# Patient Record
Sex: Female | Born: 1955 | Race: White | Hispanic: No | Smoking: Former smoker
Health system: Southern US, Community
[De-identification: ages and names within clinical notes are randomized; demographics above are authoritative.]

## PROBLEM LIST (undated history)

## (undated) DIAGNOSIS — E785 Hyperlipidemia, unspecified: Secondary | ICD-10-CM

## (undated) DIAGNOSIS — J449 Chronic obstructive pulmonary disease, unspecified: Secondary | ICD-10-CM

## (undated) DIAGNOSIS — F419 Anxiety disorder, unspecified: Secondary | ICD-10-CM

## (undated) DIAGNOSIS — D649 Anemia, unspecified: Secondary | ICD-10-CM

## (undated) HISTORY — PX: CHOLECYSTECTOMY: SHX55

## (undated) HISTORY — DX: Anemia, unspecified: D64.9

## (undated) HISTORY — PX: TUBAL LIGATION: SHX77

## (undated) HISTORY — DX: Hyperlipidemia, unspecified: E78.5

## (undated) HISTORY — PX: APPENDECTOMY: SHX54

## (undated) HISTORY — DX: Anxiety disorder, unspecified: F41.9

---

## 2015-04-05 ENCOUNTER — Ambulatory Visit: Payer: Self-pay

## 2015-04-09 ENCOUNTER — Encounter: Payer: Self-pay | Admitting: *Deleted

## 2015-04-09 ENCOUNTER — Ambulatory Visit: Payer: Worker's Compensation | Admitting: Certified Registered"

## 2015-04-09 ENCOUNTER — Ambulatory Visit
Admission: RE | Admit: 2015-04-09 | Discharge: 2015-04-09 | Disposition: A | Discharge status: Home / Self Care | Payer: Worker's Compensation | Source: Ambulatory Visit | Attending: Orthopedic Surgery | Admitting: Orthopedic Surgery

## 2015-04-09 ENCOUNTER — Encounter
Admission: RE | Disposition: A | Discharge status: Home / Self Care | Payer: Self-pay | Source: Ambulatory Visit | Attending: Orthopedic Surgery

## 2015-04-09 DIAGNOSIS — Z8489 Family history of other specified conditions: Secondary | ICD-10-CM | POA: Insufficient documentation

## 2015-04-09 DIAGNOSIS — M7502 Adhesive capsulitis of left shoulder: Secondary | ICD-10-CM | POA: Insufficient documentation

## 2015-04-09 DIAGNOSIS — Z9049 Acquired absence of other specified parts of digestive tract: Secondary | ICD-10-CM | POA: Diagnosis not present

## 2015-04-09 HISTORY — PX: SHOULDER CLOSED REDUCTION: SHX1051

## 2015-04-09 SURGERY — MANIPULATION, JOINT, SHOULDER, WITH ANESTHESIA
Anesthesia: General | Laterality: Left

## 2015-04-09 MED ORDER — OXYCODONE-ACETAMINOPHEN 5-325 MG PO TABS: 1.0000 | ORAL_TABLET | Freq: Four times a day (QID) | ORAL | Status: DC | PRN

## 2015-04-09 MED ORDER — LACTATED RINGERS IV SOLN
INTRAVENOUS | Status: DC
  Administered 2015-04-09: 13:00:00 via INTRAVENOUS

## 2015-04-09 MED ORDER — TRIAMCINOLONE ACETONIDE 40 MG/ML IJ SUSP
INTRAMUSCULAR | Status: DC | PRN
  Administered 2015-04-09: 11 mL via INTRAMUSCULAR

## 2015-04-09 MED ORDER — LIDOCAINE HCL (CARDIAC) 20 MG/ML IV SOLN
INTRAVENOUS | Status: DC | PRN
  Administered 2015-04-09: 60 mg via INTRAVENOUS

## 2015-04-09 MED ORDER — TRIAMCINOLONE ACETONIDE 40 MG/ML IJ SUSP
INTRAMUSCULAR | Status: AC
  Filled 2015-04-09: qty 1

## 2015-04-09 MED ORDER — FAMOTIDINE 20 MG PO TABS
ORAL_TABLET | ORAL | Status: AC
  Filled 2015-04-09: qty 1

## 2015-04-09 MED ORDER — PROPOFOL 10 MG/ML IV BOLUS
INTRAVENOUS | Status: DC | PRN
  Administered 2015-04-09: 140 mg via INTRAVENOUS

## 2015-04-09 MED ORDER — PHENYLEPHRINE HCL 10 MG/ML IJ SOLN
INTRAMUSCULAR | Status: DC | PRN
  Administered 2015-04-09: 100 ug via INTRAVENOUS

## 2015-04-09 MED ORDER — ONDANSETRON HCL 4 MG/2ML IJ SOLN: 4.0000 mg | Freq: Once | INTRAMUSCULAR | Status: DC | PRN

## 2015-04-09 MED ORDER — BUPIVACAINE-EPINEPHRINE (PF) 0.5% -1:200000 IJ SOLN
INTRAMUSCULAR | Status: AC
  Filled 2015-04-09: qty 30

## 2015-04-09 MED ORDER — FAMOTIDINE 20 MG PO TABS
20.0000 mg | ORAL_TABLET | Freq: Once | ORAL | Status: AC
  Administered 2015-04-09: 20 mg via ORAL

## 2015-04-09 MED ORDER — OXYCODONE-ACETAMINOPHEN 5-325 MG PO TABS
ORAL_TABLET | ORAL | Status: AC
  Filled 2015-04-09: qty 1

## 2015-04-09 MED ORDER — BUPIVACAINE HCL (PF) 0.5 % IJ SOLN: INTRAMUSCULAR | Status: DC | PRN

## 2015-04-09 MED ORDER — OXYCODONE-ACETAMINOPHEN 5-325 MG PO TABS
1.0000 | ORAL_TABLET | Freq: Four times a day (QID) | ORAL | Status: DC | PRN
  Administered 2015-04-09: 1 via ORAL

## 2015-04-09 MED ORDER — FENTANYL CITRATE (PF) 100 MCG/2ML IJ SOLN
INTRAMUSCULAR | Status: DC | PRN
  Administered 2015-04-09: 25 ug via INTRAVENOUS

## 2015-04-09 MED ORDER — FENTANYL CITRATE (PF) 100 MCG/2ML IJ SOLN: 25.0000 ug | INTRAMUSCULAR | Status: DC | PRN

## 2015-04-09 MED ORDER — BUPIVACAINE HCL (PF) 0.5 % IJ SOLN
INTRAMUSCULAR | Status: AC
  Filled 2015-04-09: qty 30

## 2015-04-09 SURGICAL SUPPLY — 6 items
GAUZE SPONGE 4X4 12PLY STRL (GAUZE/BANDAGES/DRESSINGS) ×2 IMPLANT
GLOVE BIOGEL PI IND STRL 9 (GLOVE) IMPLANT
GLOVE BIOGEL PI INDICATOR 9 (GLOVE)
GLOVE SURG ORTHO 9.0 STRL STRW (GLOVE) IMPLANT
GOWN SPECIALTY ULTRA XL (MISCELLANEOUS) IMPLANT
STRAP SAFETY BODY (MISCELLANEOUS) ×2 IMPLANT

## 2015-04-09 NOTE — Anesthesia Postprocedure Evaluation (Signed)
  Anesthesia Post-op Note  Patient: Brandi [name]  Procedure(s) Performed: Procedure(s): CLOSED MANIPULATION SHOULDER (Left)  Anesthesia type:General  Patient location: PACU  Post pain: Pain level controlled  Post assessment: Post-op Vital signs reviewed, Patient's Cardiovascular Status Stable, Respiratory Function Stable, Patent Airway and No signs of Nausea or vomiting  Post vital signs: Reviewed and stable  Last Vitals:  Filed Vitals:   04/09/15 1438  BP:   Pulse: 74  Temp: 36.3 C  Resp:     Level of consciousness: awake, alert  and patient cooperative  Complications: No apparent anesthesia complications

## 2015-04-09 NOTE — Transfer of Care (Signed)
Immediate Anesthesia Transfer of Care Note  Patient: Brandi [name]  Procedure(s) Performed: Procedure(s): CLOSED MANIPULATION SHOULDER (Left)  Patient Location: PACU  Anesthesia Type:General  Level of Consciousness: awake, alert  and oriented  Airway & Oxygen Therapy: Patient connected to face mask oxygen  Post-op Assessment: Report given to RN  Post vital signs: stable  Last Vitals:  Filed Vitals:   04/09/15 1226  BP: 117/78  Pulse: 84  Temp: 36.6 C  Resp: 12    Complications: No apparent anesthesia complications

## 2015-04-09 NOTE — Op Note (Signed)
04/09/2015  2:37 PM  PATIENT:  Brandi [name]  59 y.o. female  PRE-OPERATIVE DIAGNOSIS:  frozen left shoulder  POST-OPERATIVE DIAGNOSIS:  same as pre-op  PROCEDURE:  Procedure(s): CLOSED MANIPULATION SHOULDER (Left)  SURGEON:  Surgeon(s) and Role:    * Kennedy BuckerMichael Milany Geck, MD - Primary  PHYSICIAN ASSISTANT:   ASSISTANTS: none   ANESTHESIA:   general  EBL:     BLOOD ADMINISTERED:none  DRAINS: none   LOCAL MEDICATIONS USED:  MARCAINE    and Amount: 10 ml, Kenalog 40 mg  SPECIMEN:  No Specimen  DISPOSITION OF SPECIMEN:  N/A  COUNTS:  NO count, no iincision  TOURNIQUET:  * No tourniquets in log *  DICTATION: Appropriate patient identification and timeout procedure was completed. The assistant held the scapula stabilized with gentle motion of the forearm was brought up into flexion and was stiff at approximately 110 gentle pressure held with the arm held close to the shoulder to prevent fracture gave audible popping of adhesions and flexion up to 170 bringing the arm down and then bringing the arm into abduction abduction could be brought to 160. Following this the anterior shoulder was prepped with Betadine 40 mg Kenalog 1 cc and 10 cc of 0.5% Sensorcaine was infiltrated in the joint for postop analgesia and try to prevent recurrence of adhesions. Patient tolerated procedure well  PLAN OF CARE: Discharge to home after PACU  PATIENT DISPOSITION:  PACU - hemodynamically stable.   Delay start of Pharmacological VTE agent (>24hrs) due to surgical blood loss or risk of bleeding: not applicable

## 2015-04-09 NOTE — Brief Op Note (Signed)
04/09/2015  2:36 PM  PATIENT:  Brandi [name]  59 y.o. female  PRE-OPERATIVE DIAGNOSIS:  frozen left shoulder  POST-OPERATIVE DIAGNOSIS:  same as pre-op  PROCEDURE:  Procedure(s): CLOSED MANIPULATION SHOULDER (Left)  SURGEON:  Surgeon(s) and Role:    * Kennedy BuckerMichael Pattiann Solanki, MD - Primary  PHYSICIAN ASSISTANT:   ASSISTANTS: none   ANESTHESIA:   general  EBL:     BLOOD ADMINISTERED:none  DRAINS: none   LOCAL MEDICATIONS USED:  MARCAINE   Kenalog  SPECIMEN:  No Specimen  DISPOSITION OF SPECIMEN:  N/A  COUNTS:  YES  TOURNIQUET:  * No tourniquets in log *  DICTATION: .Dragon Dictation  PLAN OF CARE: Discharge to home after PACU  PATIENT DISPOSITION:  PACU - hemodynamically stable.   Delay start of Pharmacological VTE agent (>24hrs) due to surgical blood loss or risk of bleeding: not applicable

## 2015-04-09 NOTE — Progress Notes (Signed)
Discharge instructions with written handout to husband and patient. Verbalizes understanding.  Questions answered as needed.

## 2015-04-09 NOTE — Anesthesia Preprocedure Evaluation (Addendum)
Anesthesia Evaluation  Patient identified by MRN, date of birth, ID band Patient awake    Reviewed: Allergy & Precautions, NPO status , Patient's Chart, lab work & pertinent test results  Airway Mallampati: II  TM Distance: >3 FB Neck ROM: Full    Dental  (+) Edentulous Upper, Partial Lower   Pulmonary Current Smoker,  breath sounds clear to auscultation  Pulmonary exam normal       Cardiovascular negative cardio ROS  Rhythm:Regular Rate:Normal     Neuro/Psych negative neurological ROS  negative psych ROS   GI/Hepatic negative GI ROS, Neg liver ROS,   Endo/Other  negative endocrine ROS  Renal/GU negative Renal ROS  negative genitourinary   Musculoskeletal Procedure for frozen left shoulder   Abdominal Normal abdominal exam  (+)   Peds  Hematology negative hematology ROS (+) anemia ,   Anesthesia Other Findings   Reproductive/Obstetrics negative OB ROS                          Anesthesia Physical Anesthesia Plan  ASA: II  Anesthesia Plan: General   Post-op Pain Management:    Induction: Intravenous  Airway Management Planned: Mask  Additional Equipment:   Intra-op Plan:   Post-operative Plan:   Informed Consent: I have reviewed the patients History and Physical, chart, labs and discussed the procedure including the risks, benefits and alternatives for the proposed anesthesia with the patient or authorized representative who has indicated his/her understanding and acceptance.   Dental advisory given  Plan Discussed with: CRNA and Surgeon  Anesthesia Plan Comments:        Anesthesia Quick Evaluation

## 2015-04-09 NOTE — Anesthesia Procedure Notes (Signed)
Date/Time: 04/09/2015 2:19 PM Performed by: Irving BurtonBACHICH, Kanylah Muench Pre-anesthesia Checklist: Patient identified, Emergency Drugs available, Suction available, Patient being monitored and Timeout performed Patient Re-evaluated:Patient Re-evaluated prior to inductionOxygen Delivery Method: Circle system utilized Preoxygenation: Pre-oxygenation with 100% oxygen Intubation Type: IV induction Ventilation: Mask ventilation without difficulty

## 2015-04-09 NOTE — H&P (Signed)
Reviewed paper H+P, will be scanned into chart. No changes noted.  

## 2015-04-09 NOTE — Discharge Instructions (Addendum)
Should have physical therapy tomorrow try to move arm is much as possible. Can remove Band-Aid tomorrow

## 2015-04-15 ENCOUNTER — Encounter: Payer: Self-pay | Admitting: Orthopedic Surgery

## 2015-05-09 ENCOUNTER — Other Ambulatory Visit: Payer: Self-pay | Admitting: Orthopedic Surgery

## 2015-05-09 DIAGNOSIS — M7502 Adhesive capsulitis of left shoulder: Secondary | ICD-10-CM

## 2015-05-17 ENCOUNTER — Ambulatory Visit: Payer: Self-pay

## 2015-06-26 ENCOUNTER — Encounter
Admission: RE | Admit: 2015-06-26 | Discharge: 2015-06-26 | Disposition: A | Discharge status: Home / Self Care | Payer: Worker's Compensation | Source: Ambulatory Visit | Attending: Surgery | Admitting: Surgery

## 2015-06-26 DIAGNOSIS — M75112 Incomplete rotator cuff tear or rupture of left shoulder, not specified as traumatic: Secondary | ICD-10-CM | POA: Diagnosis not present

## 2015-06-26 NOTE — Patient Instructions (Signed)
  Your procedure is scheduled on: July 26 [facility]. To find out your arrival time please call [phone] between 1PM - 3PM on July 25.  Remember: Instructions that are not followed completely may result in serious medical risk, up to and including death, or upon the discretion of your surgeon and anesthesiologist your surgery may need to be rescheduled.    __x__ 1. Do not eat food or drink liquids after midnight. No gum chewing or hard candies.     __x__ 2. No Alcohol for 24 hours before or after surgery.   ____ 3. Bring all medications with you on the day of surgery if instructed.    __x__ 4. Notify your doctor if there is any change in your medical condition     (cold, fever, infections).     Do not wear jewelry, make-up, hairpins, clips or nail polish.  Do not wear lotions, powders, or perfumes. You may wear deodorant.  Do not shave 48 hours prior to surgery. Men may shave face and neck.  Do not bring valuables to the hospital.    [facility] is not responsible for any belongings or valuables.               Contacts, dentures or bridgework may not be worn into surgery.  Leave your suitcase in the car. After surgery it may be brought to your room.  For patients admitted to the hospital, discharge time is determined by your                treatment team.   Patients discharged the day of surgery will not be allowed to drive home.   Please read over the following fact sheets that you were given:                  ____ Fleet Enema (as directed)   _x___ Use CHG Soap as directed  ____ Use inhalers on the day of surgery  ____ Stop metformin 2 days prior to surgery    ____ Take 1/2 of usual insulin dose the night before surgery and none on the morning of surgery.   ____ Stop Coumadin/Plavix/aspirin on / does not take  __x__ Stop Anti-inflammatories 06/26/15   ____ Stop supplements until after surgery.    ____ Bring C-Pap to the hospital.

## 2015-07-02 ENCOUNTER — Encounter: Payer: Self-pay | Admitting: *Deleted

## 2015-07-02 ENCOUNTER — Ambulatory Visit: Payer: Worker's Compensation | Admitting: Anesthesiology

## 2015-07-02 ENCOUNTER — Ambulatory Visit
Admission: RE | Admit: 2015-07-02 | Discharge: 2015-07-02 | Disposition: A | Discharge status: Home / Self Care | Payer: Worker's Compensation | Source: Ambulatory Visit | Attending: Surgery | Admitting: Surgery

## 2015-07-02 ENCOUNTER — Encounter
Admission: RE | Disposition: A | Discharge status: Home / Self Care | Payer: Self-pay | Source: Ambulatory Visit | Attending: Surgery

## 2015-07-02 DIAGNOSIS — M7502 Adhesive capsulitis of left shoulder: Secondary | ICD-10-CM | POA: Insufficient documentation

## 2015-07-02 DIAGNOSIS — F17210 Nicotine dependence, cigarettes, uncomplicated: Secondary | ICD-10-CM | POA: Diagnosis not present

## 2015-07-02 DIAGNOSIS — M7542 Impingement syndrome of left shoulder: Secondary | ICD-10-CM | POA: Diagnosis not present

## 2015-07-02 DIAGNOSIS — M25512 Pain in left shoulder: Secondary | ICD-10-CM

## 2015-07-02 HISTORY — PX: SHOULDER ARTHROSCOPY: SHX128

## 2015-07-02 SURGERY — ARTHROSCOPY, SHOULDER
Anesthesia: General | Site: Shoulder | Laterality: Left | Wound class: Clean

## 2015-07-02 MED ORDER — EPHEDRINE SULFATE 50 MG/ML IJ SOLN
INTRAMUSCULAR | Status: DC | PRN
  Administered 2015-07-02: 10 mg via INTRAVENOUS

## 2015-07-02 MED ORDER — LIDOCAINE HCL (CARDIAC) 20 MG/ML IV SOLN
INTRAVENOUS | Status: DC | PRN
  Administered 2015-07-02: 30 mg via INTRAVENOUS

## 2015-07-02 MED ORDER — EPINEPHRINE HCL 1 MG/ML IJ SOLN
INTRAMUSCULAR | Status: AC
  Filled 2015-07-02: qty 2

## 2015-07-02 MED ORDER — ONDANSETRON HCL 4 MG/2ML IJ SOLN
INTRAMUSCULAR | Status: DC | PRN
  Administered 2015-07-02: 4 mg via INTRAVENOUS

## 2015-07-02 MED ORDER — BUPIVACAINE-EPINEPHRINE (PF) 0.5% -1:200000 IJ SOLN
INTRAMUSCULAR | Status: DC | PRN
  Administered 2015-07-02: 22 mL via PERINEURAL

## 2015-07-02 MED ORDER — FAMOTIDINE 20 MG PO TABS
20.0000 mg | ORAL_TABLET | Freq: Once | ORAL | Status: AC
  Administered 2015-07-02: 20 mg via ORAL

## 2015-07-02 MED ORDER — HYDROMORPHONE HCL 1 MG/ML IJ SOLN
INTRAMUSCULAR | Status: AC
  Administered 2015-07-02: 0.5 mg via INTRAVENOUS
  Filled 2015-07-02: qty 1

## 2015-07-02 MED ORDER — CEFAZOLIN SODIUM 1-5 GM-% IV SOLN
INTRAVENOUS | Status: AC
  Filled 2015-07-02: qty 50

## 2015-07-02 MED ORDER — MIDAZOLAM HCL 2 MG/2ML IJ SOLN
INTRAMUSCULAR | Status: DC | PRN
  Administered 2015-07-02: 1 mg via INTRAVENOUS

## 2015-07-02 MED ORDER — SODIUM CHLORIDE 0.9 % IR SOLN
Status: DC | PRN
  Administered 2015-07-02: 2 mL

## 2015-07-02 MED ORDER — PROMETHAZINE HCL 25 MG/ML IJ SOLN: 25.0000 mg | Freq: Once | INTRAMUSCULAR | Status: DC

## 2015-07-02 MED ORDER — ONDANSETRON HCL 4 MG/2ML IJ SOLN: 4.0000 mg | Freq: Once | INTRAMUSCULAR | Status: DC | PRN

## 2015-07-02 MED ORDER — OXYCODONE HCL 5 MG PO TABS: 5.0000 mg | ORAL_TABLET | ORAL | Status: DC | PRN

## 2015-07-02 MED ORDER — FENTANYL CITRATE (PF) 100 MCG/2ML IJ SOLN
INTRAMUSCULAR | Status: DC | PRN
  Administered 2015-07-02: 25 ug via INTRAVENOUS
  Administered 2015-07-02: 50 ug via INTRAVENOUS
  Administered 2015-07-02: 25 ug via INTRAVENOUS

## 2015-07-02 MED ORDER — PROMETHAZINE HCL 25 MG/ML IJ SOLN
INTRAMUSCULAR | Status: AC
  Administered 2015-07-02: 25 mg
  Filled 2015-07-02: qty 1

## 2015-07-02 MED ORDER — ROCURONIUM BROMIDE 100 MG/10ML IV SOLN
INTRAVENOUS | Status: DC | PRN
  Administered 2015-07-02: 30 mg via INTRAVENOUS

## 2015-07-02 MED ORDER — GLYCOPYRROLATE 0.2 MG/ML IJ SOLN
INTRAMUSCULAR | Status: DC | PRN
  Administered 2015-07-02: .8 mg via INTRAVENOUS

## 2015-07-02 MED ORDER — FENTANYL CITRATE (PF) 100 MCG/2ML IJ SOLN
INTRAMUSCULAR | Status: AC
  Administered 2015-07-02: 25 ug via INTRAVENOUS
  Filled 2015-07-02: qty 2

## 2015-07-02 MED ORDER — HYDROMORPHONE HCL 1 MG/ML IJ SOLN
0.5000 mg | INTRAMUSCULAR | Status: DC | PRN
  Administered 2015-07-02 (×2): 0.5 mg via INTRAVENOUS

## 2015-07-02 MED ORDER — FENTANYL CITRATE (PF) 100 MCG/2ML IJ SOLN
25.0000 ug | INTRAMUSCULAR | Status: DC | PRN
  Administered 2015-07-02 (×4): 25 ug via INTRAVENOUS

## 2015-07-02 MED ORDER — LACTATED RINGERS IV SOLN
INTRAVENOUS | Status: DC
  Administered 2015-07-02: 11:00:00 via INTRAVENOUS

## 2015-07-02 MED ORDER — PROPOFOL 10 MG/ML IV BOLUS
INTRAVENOUS | Status: DC | PRN
  Administered 2015-07-02: 150 mg via INTRAVENOUS

## 2015-07-02 MED ORDER — CEFAZOLIN SODIUM 1-5 GM-% IV SOLN
1.0000 g | Freq: Once | INTRAVENOUS | Status: AC
  Administered 2015-07-02: 1 g via INTRAVENOUS

## 2015-07-02 MED ORDER — BUPIVACAINE-EPINEPHRINE (PF) 0.5% -1:200000 IJ SOLN
INTRAMUSCULAR | Status: AC
  Filled 2015-07-02: qty 30

## 2015-07-02 MED ORDER — PHENYLEPHRINE HCL 10 MG/ML IJ SOLN
INTRAMUSCULAR | Status: DC | PRN
  Administered 2015-07-02 (×3): .1 ug via INTRAVENOUS

## 2015-07-02 MED ORDER — NEOSTIGMINE METHYLSULFATE 10 MG/10ML IV SOLN
INTRAVENOUS | Status: DC | PRN
  Administered 2015-07-02: 4 mg via INTRAVENOUS

## 2015-07-02 MED ORDER — FAMOTIDINE 20 MG PO TABS
ORAL_TABLET | ORAL | Status: AC
  Administered 2015-07-02: 20 mg via ORAL
  Filled 2015-07-02: qty 1

## 2015-07-02 SURGICAL SUPPLY — 48 items
ANCHOR JUGGERKNOT WTAP NDL 2.9 (Anchor) IMPLANT
BIT DRILL JUGRKNT W/NDL BIT2.9 (DRILL) IMPLANT
BLADE FULL RADIUS 3.5 (BLADE) ×2 IMPLANT
BLADE SHAVER 4.5X7 STR FR (MISCELLANEOUS) IMPLANT
BUR ACROMIONIZER 4.0 (BURR) ×2 IMPLANT
BUR BR 5.5 WIDE MOUTH (BURR) IMPLANT
CANNULA 8.5X75 THRED (CANNULA) ×2 IMPLANT
CANNULA SHAVER 8MMX76MM (CANNULA) IMPLANT
CHLORAPREP W/TINT 26ML (MISCELLANEOUS) ×4 IMPLANT
DRAPE IMP U-DRAPE 54X76 (DRAPES) ×4 IMPLANT
DRAPE SURG 17X11 SM STRL (DRAPES) ×2 IMPLANT
DRILL JUGGERKNOT W/NDL BIT 2.9 (DRILL)
GAUZE PETRO XEROFOAM 1X8 (MISCELLANEOUS) ×2 IMPLANT
GAUZE SPONGE 4X4 12PLY STRL (GAUZE/BANDAGES/DRESSINGS) ×2 IMPLANT
GLOVE BIO SURGEON STRL SZ7.5 (GLOVE) ×4 IMPLANT
GLOVE BIO SURGEON STRL SZ8 (GLOVE) ×4 IMPLANT
GLOVE BIOGEL PI IND STRL 8 (GLOVE) ×1 IMPLANT
GLOVE BIOGEL PI INDICATOR 8 (GLOVE) ×1
GLOVE INDICATOR 8.0 STRL GRN (GLOVE) ×2 IMPLANT
GOWN STRL REUS W/ TWL LRG LVL3 (GOWN DISPOSABLE) ×2 IMPLANT
GOWN STRL REUS W/ TWL XL LVL3 (GOWN DISPOSABLE) IMPLANT
GOWN STRL REUS W/TWL LRG LVL3 (GOWN DISPOSABLE) ×2
GOWN STRL REUS W/TWL XL LVL3 (GOWN DISPOSABLE)
GRASPER SUT 15 45D LOW PRO (SUTURE) IMPLANT
IV LACTATED RINGER IRRG 3000ML (IV SOLUTION) ×2
IV LR IRRIG 3000ML ARTHROMATIC (IV SOLUTION) ×2 IMPLANT
MANIFOLD NEPTUNE II (INSTRUMENTS) ×2 IMPLANT
MASK FACE SPIDER DISP (MASK) ×2 IMPLANT
MAT BLUE FLOOR 46X72 FLO (MISCELLANEOUS) ×2 IMPLANT
NDL MAYO CATGUT SZ4 (NEEDLE) IMPLANT
NEEDLE MAYO 6 CRC TAPER PT (NEEDLE) IMPLANT
NEEDLE MAYO CATGUT SZ 1.5 (NEEDLE)
NEEDLE MAYO CATGUT SZ 2 (NEEDLE) IMPLANT
NEEDLE REVERSE CUT 1/2 CRC (NEEDLE) IMPLANT
PACK ARTHROSCOPY SHOULDER (MISCELLANEOUS) ×2 IMPLANT
PAD GROUND ADULT SPLIT (MISCELLANEOUS) ×2 IMPLANT
SLING ARM LRG DEEP (SOFTGOODS) ×2 IMPLANT
SLING ULTRA II LG (MISCELLANEOUS) IMPLANT
STAPLER SKIN PROX 35W (STAPLE) ×2 IMPLANT
STRAP SAFETY BODY (MISCELLANEOUS) ×2 IMPLANT
SUT ETHIBOND 0 MO6 C/R (SUTURE) ×2 IMPLANT
SUT PROLENE 4 0 PS 2 18 (SUTURE) IMPLANT
SUT VIC AB 2-0 CT1 27 (SUTURE) ×1
SUT VIC AB 2-0 CT1 TAPERPNT 27 (SUTURE) ×1 IMPLANT
TAPE MICROFOAM 4IN (TAPE) ×2 IMPLANT
TUBING ARTHRO INFLOW-ONLY STRL (TUBING) ×2 IMPLANT
TUBING CONNECTING 10 (TUBING) ×2 IMPLANT
WAND HAND CNTRL MULTIVAC 90 (MISCELLANEOUS) ×2 IMPLANT

## 2015-07-02 NOTE — Op Note (Deleted)
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  Postoperative diagnosis: Impingement/tendinopathy with labral fraying and adhesive capsulitis, left shoulder.  Procedure: Limited arthroscopic debridement, arthroscopic subacromial decompression, and manipulation under anesthesia, right shoulder.  Anesthesia: General endotracheal with attempted interscalene block performed by the anesthesiologist.  Surgeon:   Maryagnes Amos, MD  Assistant:   None  Findings: As above. The rotator cuff was in excellent condition, as was the biceps tendon. The labrum was intact circumferentially, although did have some fraying anteriorly, superiorly, and postero-superiorly. There was extensive reactive synovitis anteriorly and superiorly, consistent with adhesive capsulitis. Prior to manipulation, the shoulder could be abducted to 150 and forward flexed to 160. At 90 abduction, the shoulder could be extra rotated to 75 and internally rotated to 70. Following manipulation, she exhibited full passive range of motion of the shoulder.  Complications: None  Fluids:   800 cc  Estimated blood loss: 3 cc  Tourniquet time: None  Drains: None  Closure: Staples   Brief clinical note: The patient is a 59 year old female with a 9-10 month history of left shoulder pain. Her symptoms have persisted despite medications, activity modification, therapy, injections, and a manipulation under anesthesia. The patient's history and examination are consistent with impingement/tendinopathy with a possible partial thickness rotator cuff tear as suggested by MRI scan. The patient presents at this time for definitive management of these shoulder symptoms.  Procedure: The patient was brought into the operating room and lain in the supine position. After adequate IV  sedation was achieved, the patient underwent attempted placement of an interscalene block by the anesthesiologist. After several failed attempts, the procedure was aborted. The patient underwent general endotracheal intubation and anesthesia before being repositioned in the beach chair position using the beach chair positioner. The left shoulder and upper extremity were prepped with ChloraPrep solution before being draped sterilely. Preoperative antibiotics were administered. A timeout was performed to confirm the proper side before the expected portal sites and incision site were injected with 0.5% Sensorcaine with epinephrine. A posterior portal was created and the glenohumeral joint thoroughly inspected with the findings as described above. An anterior portal was created using an outside-in technique. The labrum and rotator cuff were further probed, again confirming the above-noted findings. The areas of labral fraying anteriorly, superiorly, and postero-superiorly were debrided using the full-radius resector, as were the areas of synovitis. The biceps tendon was pulled into the joint and inspected, also with the findings as described above. Careful probing of the labrum demonstrated no frank detachment. The ArthroCare wand was inserted and used to obtain hemostasis as well as to "anneal" the labrum superiorly, anteriorly, and postero-superiorly. The instruments were removed from the joint after suctioning the excess fluid.  The camera was repositioned through the posterior portal into the subacromial space. A separate lateral portal was created using an outside-in technique. The 3.5 full-radius resector was introduced and used to perform a subtotal bursectomy. The ArthroCare wand was then inserted and used to remove the periosteal tissue off the undersurface of the anterior third of the acromion as well as to recess the coracoacromial ligament from its attachment along the anterior and lateral margins of the  acromion. The 4.0 mm acromionizing bur was introduced and used to complete the decompression by removing the undersurface of the anterior third of the acromion. The full radius resector was reintroduced to remove any residual bony  debris before the ArthroCare wand was reintroduced to obtain hemostasis. The instruments were then removed from the subacromial space after suctioning the excess fluid.  An approximately 4-5 cm incision was made over the anterolateral aspect of the shoulder beginning at the anterolateral corner of the acromion and extending distally in line with the bicipital groove. This incision was carried down through the subcutaneous tissues to expose the deltoid fascia. The raphae between the anterior and middle thirds was identified and this plane developed to provide access into the subacromial space. Additional bursal tissues were debrided sharply using Metzenbaum scissors. The rotator cuff tear was readily identified. The margins were debrided sharply with a #15 blade and the exposed greater tuberosity roughened with a rongeur. The tear was repaired using two Biomet 2.9 mm JuggerKnot anchors. Several of these sutures were then brought back laterally through bone tunnels and tied over bone bridges to create a two-layer closure. An apparent watertight closure was obtained.  The bicipital groove was identified by palpation and opened for 1-1.5 cm. The biceps tendon stump was retrieved through this defect. The floor of the bicipital groove was roughened with a curet before another Biomet 2.9 mm JuggerKnot anchor was inserted. Both sets of sutures were passed through the biceps tendon to effect the tenodesis. The bicipital sheath was reapproximated using two #0 Ethibond interrupted sutures, incorporating the biceps tendon to further reinforce the tenodesis.  The wound was copiously irrigated with sterile saline solution before the deltoid raphae was reapproximated using 2-0 Vicryl interrupted  sutures. The subcutaneous tissues were closed in two layers using 2-0 Vicryl interrupted sutures before the skin was closed using staples. The portal sites also were closed using staples. A sterile bulky dressing was applied to the shoulder before the arm was placed into a shoulder immobilizer. The patient was then awakened, extubated, and returned to the recovery room in satisfactory condition after tolerating the procedure well.

## 2015-07-02 NOTE — H&P (Signed)
Paper H&P to be scanned into permanent record. H&P reviewed. No changes. 

## 2015-07-02 NOTE — OR Nursing (Signed)
After getting pt dressed to go home pt began dry heaving. Phenergan  given IM per Dr Maisie Fus orders.

## 2015-07-02 NOTE — Anesthesia Preprocedure Evaluation (Signed)
Anesthesia Evaluation  Patient identified by MRN, date of birth, ID band Patient awake    Reviewed: Allergy & Precautions, NPO status , Patient's Chart, lab work & pertinent test results, reviewed documented beta blocker date and time   Airway Mallampati: II  TM Distance: >3 FB     Dental  (+) Chipped   Pulmonary Current Smoker,          Cardiovascular     Neuro/Psych    GI/Hepatic   Endo/Other    Renal/GU      Musculoskeletal   Abdominal   Peds  Hematology   Anesthesia Other Findings   Reproductive/Obstetrics                             Anesthesia Physical Anesthesia Plan  ASA: II  Anesthesia Plan: General   Post-op Pain Management: MAC Combined w/ Regional for Post-op pain   Induction: Intravenous  Airway Management Planned: Oral ETT  Additional Equipment:   Intra-op Plan:   Post-operative Plan:   Informed Consent: I have reviewed the patients History and Physical, chart, labs and discussed the procedure including the risks, benefits and alternatives for the proposed anesthesia with the patient or authorized representative who has indicated his/her understanding and acceptance.     Plan Discussed with: CRNA  Anesthesia Plan Comments:         Anesthesia Quick Evaluation

## 2015-07-02 NOTE — Anesthesia Procedure Notes (Signed)
Procedure Name: Intubation Date/Time: 07/02/2015 11:55 AM Performed by: Charna Busman Pre-anesthesia Checklist: Patient identified, Emergency Drugs available, Suction available, Patient being monitored and Timeout performed Patient Re-evaluated:Patient Re-evaluated prior to inductionOxygen Delivery Method: Circle system utilized Preoxygenation: Pre-oxygenation with 100% oxygen Intubation Type: Combination inhalational/ intravenous induction Ventilation: Mask ventilation without difficulty Laryngoscope Size: Miller and 3 Grade View: Grade II Tube type: Oral Laser Tube: Cuffed inflated with minimal occlusive pressure - saline Tube size: 7.0 mm Number of attempts: 1 Airway Equipment and Method: Stylet Placement Confirmation: ETT inserted through vocal cords under direct vision,  positive ETCO2,  CO2 detector and breath sounds checked- equal and bilateral Secured at: 20 cm Dental Injury: Teeth and Oropharynx as per pre-operative assessment  Comments: Upper plate removed prior to intubation

## 2015-07-02 NOTE — Op Note (Signed)
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  Postoperative diagnosis: Impingement/tendinopathy labral fraying and adhesive capsulitis, left shoulder.  Procedure: Limited arthroscopic debridement, arthroscopic subacromial decompression, and manipulation under anesthesia, left shoulder.  Anesthesia: General endotracheal with interscalene block placed preoperatively by the anesthesiologist.  Surgeon:   Maryagnes Amos, MD  Assistant:   None  Findings: As above. The rotator cuff and biceps tendon both were in excellent condition. There was some fraying of the labrum anteriorly, superiorly, and postero-superiorly without detachment. The articular surfaces of the glenoid and humerus both were in excellent condition. Moderate synovitis was noted anteriorly and superiorly, consistent with her diagnosis of adhesive capsulitis. Prior to manipulation, the shoulder could be forward flexed to 160 and abducted to 150. At 90 of abduction, she could be externally rotated to 75 and internally rotated to 70. Following manipulation, she exhibited full range of motion of the shoulder.  Complications: None  Fluids:   800 cc  Estimated blood loss: 3 cc  Tourniquet time: None  Drains: None  Closure: Staples   Brief clinical note: The patient is a 59 year old female with a 9-10 month history of left shoulder pain. The patient's symptoms have progressed despite medications, activity modification, physical therapy, injections, and a manipulation under anesthesia. The patient's history and examination are consistent with impingement/tendinopathy with a possible partial thickness rotator cuff tear as suggested by MRI scan. The patient presents at this time for definitive management of her shoulder symptoms.  Procedure: The patient was brought into  the operating room and lain in the supine position. After adequate IV sedation was achieved, the patient underwent attempted placement of an interscalene block by the anesthesiologist. After several failed attempts, the procedure was aborted. The patient underwent general endotracheal intubation and anesthesia before being repositioned in the beach chair position using the beach chair positioner. The left shoulder and upper extremity were prepped with ChloraPrep solution before being draped sterilely. Preoperative antibiotics were administered. A timeout was performed to confirm the proper side before the expected portal sites and incision site were injected with 0.5% Sensorcaine with epinephrine. A posterior portal was created and the glenohumeral joint thoroughly inspected with the findings as described above. An anterior portal was created using an outside-in technique. The labrum and rotator cuff were further probed, again confirming the above-noted findings. The areas of labral fraying anteriorly, superiorly, and postero-superiorly were debrided using the full-radius resector, as were the areas of synovitis. The biceps tendon was pulled into the joint and inspected, also with the findings as described above. Careful probing of the labrum demonstrated no frank detachment. The ArthroCare wand was inserted and used to obtain hemostasis as well as to "anneal" the labrum superiorly, anteriorly, and postero-superiorly. The instruments were removed from the joint after suctioning the excess fluid.   The camera was repositioned through the posterior portal into the subacromial space. A separate lateral portal was created using an outside-in technique. The 3.5 full-radius resector was introduced and used to perform a subtotal bursectomy. The ArthroCare wand was then inserted and used to remove the periosteal tissue off the undersurface of the anterior third of the acromion as well as to recess the coracoacromial  ligament from its attachment along the anterior and lateral margins of the acromion. The 4.0 mm acromionizing bur was introduced and used to complete the decompression by removing the undersurface of the anterior third  of the acromion. The full radius resector was reintroduced to remove any residual bony debris before the ArthroCare wand was reintroduced to obtain hemostasis. The instruments were then removed from the subacromial space after suctioning the excess fluid. The portal sites also were closed using staples. A sterile bulky dressing was applied to the shoulder before the arm was placed into a shoulder immobilizer. The patient was then awakened, extubated, and returned to the recovery room in satisfactory condition after tolerating the procedure well.

## 2015-07-02 NOTE — Discharge Instructions (Addendum)
Keep dressing dry and intact.  May shower after dressing changed on post-op day #4 (Saturday).  Cover staples/sutures with Band-Aids after drying off. Apply ice frequently to shoulder. Use sling as needed for comfort. May discontinue sling as symptoms permit. Progress with activities as symptoms permit. Follow-up in 10-14 days or as scheduled.AMBULATORY SURGERY  DISCHARGE INSTRUCTIONS   1) The drugs that you were given will stay in your system until tomorrow so for the next 24 hours you should not:  A) Drive an automobile B) Make any legal decisions C) Drink any alcoholic beverage   2) You may resume regular meals tomorrow.  Today it is better to start with liquids and gradually work up to solid foods.  You may eat anything you prefer, but it is better to start with liquids, then soup and crackers, and gradually work up to solid foods.   3) Please notify your doctor immediately if you have any unusual bleeding, trouble breathing, redness and pain at the surgery site, drainage, fever, or pain not relieved by medication.    4) Additional Instructions:        Please contact your physician with any problems or Same Day Surgery at [phone], Monday through Friday 6 am to 4 pm, or [place] at [facility] number at [phone].

## 2015-07-02 NOTE — Transfer of Care (Signed)
Immediate Anesthesia Transfer of Care Note  Patient: Brandi [name]  Procedure(s) Performed: Procedure(s): ,left shoulder arthroscopy, decompression and debridement (Left)  Patient Location: PACU  Anesthesia Type:General  Level of Consciousness: awake, oriented and patient cooperative  Airway & Oxygen Therapy: Patient Spontanous Breathing and Patient connected to face mask oxygen  Post-op Assessment: Report given to RN and Post -op Vital signs reviewed and stable  Post vital signs: Reviewed and stable  Last Vitals:  Filed Vitals:   07/02/15 1301  BP:   Pulse: 84  Temp: 36.2 C  Resp: 25    Complications: No apparent anesthesia complications

## 2015-07-04 NOTE — Anesthesia Postprocedure Evaluation (Signed)
  Anesthesia Post-op Note  Patient: Brandi [name]  Procedure(s) Performed: Procedure(s): ,left shoulder arthroscopy, decompression and debridement (Left)  Anesthesia type:General  Patient location: PACU  Post pain: Pain level controlled  Post assessment: Post-op Vital signs reviewed, Patient's Cardiovascular Status Stable, Respiratory Function Stable, Patent Airway and No signs of Nausea or vomiting  Post vital signs: Reviewed and stable  Last Vitals:  Filed Vitals:   07/02/15 1447  BP: 107/61  Pulse:   Temp:   Resp:     Level of consciousness: awake, alert  and patient cooperative  Complications: No apparent anesthesia complications

## 2015-07-12 NOTE — Addendum Note (Signed)
Addendum  created 07/12/15 1552 by Berdine Addison, MD   Modules edited: Anesthesia Attestations

## 2018-11-10 ENCOUNTER — Other Ambulatory Visit (HOSPITAL_COMMUNITY)
Admission: RE | Admit: 2018-11-10 | Discharge: 2018-11-10 | Disposition: A | Discharge status: Home / Self Care | Payer: Medicare HMO | Source: Ambulatory Visit | Attending: Obstetrics and Gynecology | Admitting: Obstetrics and Gynecology

## 2018-11-10 ENCOUNTER — Ambulatory Visit (INDEPENDENT_AMBULATORY_CARE_PROVIDER_SITE_OTHER): Payer: Medicare HMO | Admitting: Obstetrics and Gynecology

## 2018-11-10 ENCOUNTER — Telehealth: Payer: Self-pay | Admitting: Obstetrics and Gynecology

## 2018-11-10 ENCOUNTER — Other Ambulatory Visit: Payer: Self-pay | Admitting: Obstetrics and Gynecology

## 2018-11-10 ENCOUNTER — Encounter: Payer: Self-pay | Admitting: Obstetrics and Gynecology

## 2018-11-10 VITALS — BP 114/80 | HR 91 | Ht 65.0 in | Wt 126.5 lb

## 2018-11-10 DIAGNOSIS — Z01419 Encounter for gynecological examination (general) (routine) without abnormal findings: Secondary | ICD-10-CM | POA: Diagnosis not present

## 2018-11-10 DIAGNOSIS — R928 Other abnormal and inconclusive findings on diagnostic imaging of breast: Secondary | ICD-10-CM

## 2018-11-10 DIAGNOSIS — Z124 Encounter for screening for malignant neoplasm of cervix: Secondary | ICD-10-CM | POA: Insufficient documentation

## 2018-11-10 DIAGNOSIS — Z1211 Encounter for screening for malignant neoplasm of colon: Secondary | ICD-10-CM

## 2018-11-10 DIAGNOSIS — Z0000 Encounter for general adult medical examination without abnormal findings: Secondary | ICD-10-CM

## 2018-11-10 DIAGNOSIS — Z13820 Encounter for screening for osteoporosis: Secondary | ICD-10-CM

## 2018-11-10 DIAGNOSIS — Z1239 Encounter for other screening for malignant neoplasm of breast: Secondary | ICD-10-CM

## 2018-11-10 DIAGNOSIS — Z122 Encounter for screening for malignant neoplasm of respiratory organs: Secondary | ICD-10-CM

## 2018-11-10 DIAGNOSIS — Z13220 Encounter for screening for lipoid disorders: Secondary | ICD-10-CM

## 2018-11-10 NOTE — Telephone Encounter (Signed)
Patient is aware she needs to sign a consent at [facility] for her prior mammogram imaging. Per patient, her last mammogram and bone density were ordered by Dr Minta BalsamGhatt at [facility] around 2000, both with normal results.  Patient was given directions to [facility] and can possibly go today to sign the consents. Patient would like the mammogram and bone density scheduled together. Patient is also aware to expect a [facility] regarding the lung cancer screening. Patient was given my phone# and ext.

## 2018-11-10 NOTE — Progress Notes (Signed)
Gynecology Annual Exam  PCP: Jaclyn Shaggyate, Denny C, MD  Chief Complaint:  Chief Complaint  Patient presents with  . Gynecologic Exam    On left side of pelvis a golf ball size ball comes and goes    History of Present Illness:Patient is a 62 y.o. G1P1001 presents for annual exam. The patient has no complaints today.   LMP: No LMP recorded. Patient is postmenopausal. Menarche:13 or 14  Menopause: In 2003 Periods were regular and monthly, no issues when she was younger. Las pap smear in 2001.   The patient is sexually active. She denies dyspareunia.  The patient does perform self breast exams.  There is no notable family history of breast or ovarian cancer in her family.  The patient wears seatbelts: yes.   The patient has regular exercise: no.    The patient denies current symptoms of depression.     She is concerned about a left bump she feels in her groin. She used to carry her cell phone there in the pants pocket. She can not say how long it has been there, reports that it comes a goes is size.   Review of Systems: ROS  Past Medical History:  History reviewed. No pertinent past medical history.  Past Surgical History:  Past Surgical History:  Procedure Laterality Date  . APPENDECTOMY    . CHOLECYSTECTOMY    . SHOULDER ARTHROSCOPY Left 07/02/2015   Procedure: ,left shoulder arthroscopy, decompression and debridement;  Surgeon: Christena FlakeJohn J Poggi, MD;  Location: [facility] ORS;  Service: Orthopedics;  Laterality: Left;  . SHOULDER CLOSED REDUCTION Left 04/09/2015   Procedure: CLOSED MANIPULATION SHOULDER;  Surgeon: Kennedy BuckerMichael Menz, MD;  Location: [facility] ORS;  Service: Orthopedics;  Laterality: Left;    Gynecologic History:  No LMP recorded. Patient is postmenopausal. Last Pap: Results were: 2001, unknown  Last mammogram: 2001, unknown result  Obstetric History: G1P1001  Family History:  Family History  Problem Relation Age of Onset  . Breast cancer Mother 9160  . Alzheimer's disease  Mother     Social History:  Social History   Socioeconomic History  . Marital status: Married    Spouse name: Not on file  . Number of children: Not on file  . Years of education: Not on file  . Highest education level: Not on file  Occupational History  . Not on file  Social Needs  . Financial resource strain: Not on file  . Food insecurity:    Worry: Not on file    Inability: Not on file  . Transportation needs:    Medical: Not on file    Non-medical: Not on file  Tobacco Use  . Smoking status: Former Smoker    Types: E-cigarettes, Cigarettes    Last attempt to quit: 12/08/2015    Years since quitting: 2.9  . Smokeless tobacco: Never Used  Substance and Sexual Activity  . Alcohol use: No  . Drug use: No  . Sexual activity: Yes    Birth control/protection: Post-menopausal  Lifestyle  . Physical activity:    Days per week: Not on file    Minutes per session: Not on file  . Stress: Not on file  Relationships  . Social connections:    Talks on phone: Not on file    Gets together: Not on file    Attends religious service: Not on file    Active member of club or organization: Not on file    Attends meetings of clubs or organizations:  Not on file    Relationship status: Not on file  . Intimate partner violence:    Fear of current or ex partner: Not on file    Emotionally abused: Not on file    Physically abused: Not on file    Forced sexual activity: Not on file  Other Topics Concern  . Not on file  Social History Narrative  . Not on file    Allergies:  No Known Allergies  Medications: Prior to Admission medications   Not on File    Physical Exam Vitals: Blood pressure 114/80, pulse 91, height 5\' 5"  (1.651 m), weight 126 lb 8 oz (57.4 kg).  General: NAD HEENT: normocephalic, anicteric Thyroid: no enlargement, no palpable nodules Pulmonary: No increased work of breathing, CTAB Cardiovascular: RRR, distal pulses 2+ Breast: Breast symmetrical, no  tenderness, no palpable nodules or masses, no skin or nipple retraction present, no nipple discharge.  No axillary or supraclavicular lymphadenopathy. Abdomen: NABS, soft, non-tender, non-distended.  Umbilicus without lesions.  No hepatomegaly, splenomegaly or masses palpable. No evidence of hernia  Genitourinary:  External: Normal external female genitalia.  Normal urethral meatus, normal Bartholin's and Skene's glands.    Vagina: Normal vaginal mucosa, no evidence of prolapse.    Cervix: Grossly normal in appearance, no bleeding  Uterus: Non-enlarged, mobile, normal contour.  No CMT  Adnexa: ovaries non-enlarged, no adnexal masses  Rectal: deferred  Lymphatic: on small mobile left sided lymph node felt. About 1cm in size, soft, will observe.  Extremities: no edema, erythema, or tenderness Neurologic: Grossly intact Psychiatric: mood appropriate, affect full  Female chaperone present for pelvic and breast  portions of the physical exam     Assessment: 62 y.o. G1P1001 routine annual exam  Plan: Problem List Items Addressed This Visit    None    Visit Diagnoses    Health care maintenance    -  Primary   Relevant Orders   CBC   Comprehensive metabolic panel   Lipid panel   DG Bone Density   MM DIGITAL SCREENING BILATERAL   Cologuard   Screening cholesterol level       Relevant Orders   CBC   Comprehensive metabolic panel   Lipid panel   Osteoporosis screening       Relevant Orders   DG Bone Density   Colon cancer screening       Relevant Orders   Cologuard   Encounter for screening for lung cancer       Relevant Orders   Ambulatory referral to Oncology   Screening breast examination       Abnormality of breast on screening mammography       Relevant Orders   MM DIGITAL SCREENING BILATERAL   Screening for cervical cancer       Relevant Orders   Cytology - PAP      1) Mammogram - recommend yearly screening mammogram.  Mammogram Was ordered today  2) STI  screening  was offered and declined  3) ASCCP guidelines and rational discussed.  Patient opts for every 3 years screening interval  4) Osteoporosis  - per USPTF routine screening DEXA at age 5 - FRAX 10 year major fracture risk 31%,  10 year hip fracture risk 5.3%  Consider FDA-approved medical therapies in postmenopausal women and men aged 49 years and older, based on the following: a) A hip or vertebral (clinical or morphometric) fracture b) T-score ? -2.5 at the femoral neck or spine after appropriate evaluation to exclude  secondary causes C) Low bone mass (T-score between -1.0 and -2.5 at the femoral neck or spine) and a 10-year probability of a hip fracture ? 3% or a 10-year probability of a major osteoporosis-related fracture ? 20% based on the [place]-adapted WHO algorithm   5) Routine healthcare maintenance including cholesterol, diabetes screening discussed Ordered today  6) Colonoscopy declined, she will do a cologuard. Understands that if that result is positive she needs to have an colonoscopy.   Screening recommended starting at age 83 for average risk individuals, age 60 for individuals deemed at increased risk (including African Americans) and recommended to continue until age 44.  For patient age 6-85 individualized approach is recommended.  Gold standard screening is via colonoscopy, Cologuard screening is an acceptable alternative for patient unwilling or unable to undergo colonoscopy.  "Colorectal cancer screening for average?risk adults: 2018 guideline update from the American Cancer Society"CA: A Cancer Journal for Clinicians: May 05, 2017   7) Return in about 6 months (around 05/12/2019) for return GYN visit .  High risk of osteoporosis, Dexa scan ordered.  Will have a cologuard done Will have a mammogram Will do fasting labs today Will have Dexa Scan Observe left pelvic lymph node- follow up in 6 months  Will order low dose CT for 30 pack year history.  Referral made to  [facility]   Adelene Idler MD [facility], [facility] 11/10/18 10:51 AM

## 2018-11-11 ENCOUNTER — Telehealth: Payer: Self-pay | Admitting: *Deleted

## 2018-11-11 ENCOUNTER — Encounter: Payer: Self-pay | Admitting: *Deleted

## 2018-11-11 DIAGNOSIS — Z122 Encounter for screening for malignant neoplasm of respiratory organs: Secondary | ICD-10-CM

## 2018-11-11 DIAGNOSIS — Z87891 Personal history of nicotine dependence: Secondary | ICD-10-CM

## 2018-11-11 LAB — CBC
Hematocrit: 40.3 % (ref 34.0–46.6)
Hemoglobin: 14.1 g/dL (ref 11.1–15.9)
MCH: 32.3 pg (ref 26.6–33.0)
MCHC: 35 g/dL (ref 31.5–35.7)
MCV: 92 fL (ref 79–97)
Platelets: 287 10*3/uL (ref 150–450)
RBC: 4.37 x10E6/uL (ref 3.77–5.28)
RDW: 11.8 % — ABNORMAL LOW (ref 12.3–15.4)
WBC: 6.8 10*3/uL (ref 3.4–10.8)

## 2018-11-11 LAB — COMPREHENSIVE METABOLIC PANEL
ALT: 13 IU/L (ref 0–32)
AST: 19 IU/L (ref 0–40)
Albumin/Globulin Ratio: 2.2 (ref 1.2–2.2)
Albumin: 4.6 g/dL (ref 3.6–4.8)
Alkaline Phosphatase: 76 IU/L (ref 39–117)
BUN/Creatinine Ratio: 10 — ABNORMAL LOW (ref 12–28)
BUN: 7 mg/dL — ABNORMAL LOW (ref 8–27)
Bilirubin Total: 0.5 mg/dL (ref 0.0–1.2)
CO2: 26 mmol/L (ref 20–29)
Calcium: 9.2 mg/dL (ref 8.7–10.3)
Chloride: 99 mmol/L (ref 96–106)
Creatinine, Ser: 0.71 mg/dL (ref 0.57–1.00)
GFR calc Af Amer: 105 mL/min/{1.73_m2} (ref >59)
GFR calc non Af Amer: 91 mL/min/{1.73_m2} (ref >59)
Globulin, Total: 2.1 g/dL (ref 1.5–4.5)
Glucose: 90 mg/dL (ref 65–99)
Potassium: 4.1 mmol/L (ref 3.5–5.2)
Sodium: 140 mmol/L (ref 134–144)
Total Protein: 6.7 g/dL (ref 6.0–8.5)

## 2018-11-11 LAB — LIPID PANEL
Chol/HDL Ratio: 3.2 ratio (ref 0.0–4.4)
Cholesterol, Total: 190 mg/dL (ref 100–199)
HDL: 59 mg/dL (ref >39)
LDL Calculated: 118 mg/dL — ABNORMAL HIGH (ref 0–99)
Triglycerides: 64 mg/dL (ref 0–149)
VLDL Cholesterol Cal: 13 mg/dL (ref 5–40)

## 2018-11-11 NOTE — Telephone Encounter (Signed)
Received referral for initial lung cancer screening scan. Contacted patient and obtained smoking history,(former, quit 12/08/2015, 30.75 pack year) as well as answering questions related to screening process. Patient denies signs of lung cancer such as weight loss or hemoptysis. Patient denies comorbidity that would prevent curative treatment if lung cancer were found. Patient is scheduled for shared decision making visit and CT scan on 11/28/18 at 1030am.

## 2018-11-14 LAB — COLOGUARD

## 2018-11-14 NOTE — Progress Notes (Signed)
Called and left patient a voicemail about normal labwork.

## 2018-11-15 LAB — CYTOLOGY - PAP
Diagnosis: NEGATIVE
HPV: NOT DETECTED

## 2018-11-18 ENCOUNTER — Other Ambulatory Visit: Payer: Self-pay | Admitting: Obstetrics and Gynecology

## 2018-11-18 DIAGNOSIS — R195 Other fecal abnormalities: Secondary | ICD-10-CM

## 2018-11-25 ENCOUNTER — Telehealth: Payer: Self-pay | Admitting: *Deleted

## 2018-11-25 NOTE — Telephone Encounter (Signed)
Called pt to remind them of their appt for Monday 12-23 at 1030 for LDCT screening, pt reports that she will be unable to attend and wants to be called back after the first of the year to reschedule.  Glenna FellowsShawn Perkins RN notified.

## 2018-11-28 ENCOUNTER — Ambulatory Visit: Payer: Medicare HMO

## 2018-11-28 ENCOUNTER — Inpatient Hospital Stay: Payer: Medicare HMO | Admitting: Oncology

## 2018-12-05 ENCOUNTER — Encounter: Payer: Self-pay | Admitting: *Deleted

## 2018-12-14 ENCOUNTER — Telehealth: Payer: Self-pay | Admitting: *Deleted

## 2018-12-14 NOTE — Telephone Encounter (Signed)
Attempted to contact patient r/t LDCT Screening follow up due at this time.  No answer received, message left for patient to call [phone] to schedule appointment.    

## 2018-12-21 ENCOUNTER — Telehealth: Payer: Self-pay | Admitting: *Deleted

## 2018-12-21 NOTE — Telephone Encounter (Signed)
Received referral for low dose lung cancer screening CT scan. Message left at phone number listed in EMR for patient to call me back to facilitate scheduling scan.  

## 2018-12-22 ENCOUNTER — Telehealth: Payer: Self-pay | Admitting: *Deleted

## 2018-12-22 ENCOUNTER — Encounter: Payer: Self-pay | Admitting: *Deleted

## 2018-12-22 DIAGNOSIS — Z122 Encounter for screening for malignant neoplasm of respiratory organs: Secondary | ICD-10-CM

## 2018-12-22 DIAGNOSIS — Z87891 Personal history of nicotine dependence: Secondary | ICD-10-CM

## 2018-12-22 NOTE — Telephone Encounter (Signed)
Received referral for initial lung cancer screening scan. Contacted patient and obtained smoking history,(former, quit 12/08/15, 30.75 pack year) as well as answering questions related to screening process. Patient denies signs of lung cancer such as weight loss or hemoptysis. Patient denies comorbidity that would prevent curative treatment if lung cancer were found. Patient is scheduled for shared decision making visit and CT scan on 12/29/18 at 2pm.

## 2018-12-28 ENCOUNTER — Telehealth: Payer: Self-pay | Admitting: *Deleted

## 2018-12-28 NOTE — Telephone Encounter (Signed)
Called pt to remind them of their appt for ldct screening for 12-29-2018 @1400 , message left

## 2018-12-29 ENCOUNTER — Ambulatory Visit
Admission: RE | Admit: 2018-12-29 | Discharge: 2018-12-29 | Disposition: A | Discharge status: Home / Self Care | Payer: Medicare HMO | Source: Ambulatory Visit | Attending: Nurse Practitioner | Admitting: Nurse Practitioner

## 2018-12-29 ENCOUNTER — Inpatient Hospital Stay: Payer: Medicare HMO | Attending: Nurse Practitioner | Admitting: Oncology

## 2018-12-29 DIAGNOSIS — Z87891 Personal history of nicotine dependence: Secondary | ICD-10-CM

## 2018-12-29 DIAGNOSIS — Z122 Encounter for screening for malignant neoplasm of respiratory organs: Secondary | ICD-10-CM | POA: Diagnosis present

## 2018-12-29 NOTE — Progress Notes (Signed)
In accordance with CMS guidelines, patient has met eligibility criteria including age, absence of signs or symptoms of lung cancer.  Social History   Tobacco Use  . Smoking status: Former Smoker    Packs/day: 0.75    Years: 41.00    Pack years: 30.75    Types: E-cigarettes, Cigarettes    Last attempt to quit: 12/08/2015    Years since quitting: 3.0  . Smokeless tobacco: Never Used  Substance Use Topics  . Alcohol use: No  . Drug use: No     A shared decision-making session was conducted prior to the performance of CT scan. This includes one or more decision aids, includes benefits and harms of screening, follow-up diagnostic testing, over-diagnosis, false positive rate, and total radiation exposure.  Counseling on the importance of adherence to annual lung cancer LDCT screening, impact of co-morbidities, and ability or willingness to undergo diagnosis and treatment is imperative for compliance of the program.  Counseling on the importance of continued smoking cessation for former smokers; the importance of smoking cessation for current smokers, and information about tobacco cessation interventions have been given to patient including [place] and [address] quit [place] programs.  Written order for lung cancer screening with LDCT has been given to the patient and any and all questions have been answered to the best of my abilities.   Yearly follow up will be coordinated by Burgess Estelle, Thoracic Navigator.  Faythe Casa, NP 12/29/2018 2:23 PM

## 2018-12-30 ENCOUNTER — Encounter: Payer: Self-pay | Admitting: *Deleted

## 2019-01-04 ENCOUNTER — Ambulatory Visit
Admission: RE | Admit: 2019-01-04 | Discharge: 2019-01-04 | Disposition: A | Discharge status: Home / Self Care | Payer: Medicare HMO | Source: Ambulatory Visit | Attending: Obstetrics and Gynecology | Admitting: Obstetrics and Gynecology

## 2019-01-04 DIAGNOSIS — Z0000 Encounter for general adult medical examination without abnormal findings: Secondary | ICD-10-CM | POA: Diagnosis present

## 2019-01-04 DIAGNOSIS — Z1231 Encounter for screening mammogram for malignant neoplasm of breast: Secondary | ICD-10-CM | POA: Insufficient documentation

## 2019-01-04 DIAGNOSIS — Z13820 Encounter for screening for osteoporosis: Secondary | ICD-10-CM | POA: Insufficient documentation

## 2019-01-04 DIAGNOSIS — M810 Age-related osteoporosis without current pathological fracture: Secondary | ICD-10-CM | POA: Diagnosis not present

## 2019-01-04 DIAGNOSIS — R928 Other abnormal and inconclusive findings on diagnostic imaging of breast: Secondary | ICD-10-CM | POA: Diagnosis present

## 2019-01-06 ENCOUNTER — Telehealth: Payer: Self-pay

## 2019-01-06 NOTE — Progress Notes (Signed)
Patient has osteoporosis, called and discussed with patient, she will come in so we can discuss further and review medication options.  Adelene Idler MD [facility], [facility] 01/06/2019 6:01 PM

## 2019-01-06 NOTE — Telephone Encounter (Signed)
Please advise. Thank you

## 2019-01-06 NOTE — Telephone Encounter (Signed)
Pt calling for results of Mammogram and Bone Density.  [phone]

## 2019-01-06 NOTE — Telephone Encounter (Signed)
Called patient

## 2019-01-10 ENCOUNTER — Telehealth: Payer: Self-pay | Admitting: Obstetrics and Gynecology

## 2019-01-10 NOTE — Telephone Encounter (Signed)
Patient is schedule 01/26/19 with Dr. Jerene Pitch per patient wanted at 11 am appointment time

## 2019-01-10 NOTE — Telephone Encounter (Signed)
-----   Message from [name], MD sent at 01/06/2019  5:52 PM EST -----  Please call and schedule this patient for a GYN visit- will need 40 minutes. Thank you Dr. Jerene Pitch

## 2019-01-26 ENCOUNTER — Ambulatory Visit: Payer: Medicare HMO | Admitting: Obstetrics and Gynecology

## 2019-01-31 ENCOUNTER — Encounter: Payer: Self-pay | Admitting: Obstetrics and Gynecology

## 2019-01-31 ENCOUNTER — Ambulatory Visit (INDEPENDENT_AMBULATORY_CARE_PROVIDER_SITE_OTHER): Payer: Medicare HMO | Admitting: Obstetrics and Gynecology

## 2019-01-31 VITALS — BP 104/62 | HR 89 | Ht 64.0 in | Wt 127.0 lb

## 2019-01-31 DIAGNOSIS — R195 Other fecal abnormalities: Secondary | ICD-10-CM | POA: Diagnosis not present

## 2019-01-31 DIAGNOSIS — I7090 Unspecified atherosclerosis: Secondary | ICD-10-CM | POA: Diagnosis not present

## 2019-01-31 DIAGNOSIS — M810 Age-related osteoporosis without current pathological fracture: Secondary | ICD-10-CM

## 2019-01-31 MED ORDER — CALCIUM 1200 1200-1000 MG-UNIT PO CHEW: 1200.0000 mg | CHEWABLE_TABLET | Freq: Every day | ORAL | 11 refills | Status: DC

## 2019-01-31 MED ORDER — VITAMIN D 600 IU CAPSULE SWOG S0812: 600.0000 [IU] | ORAL_CAPSULE | Freq: Every day | ORAL | 11 refills | Status: DC

## 2019-01-31 NOTE — Patient Instructions (Signed)

## 2019-02-01 ENCOUNTER — Telehealth: Payer: Self-pay | Admitting: Gastroenterology

## 2019-02-01 LAB — VITAMIN D 25 HYDROXY (VIT D DEFICIENCY, FRACTURES): Vit D, 25-Hydroxy: 21.4 ng/mL — ABNORMAL LOW (ref 30.0–100.0)

## 2019-02-01 LAB — TSH+FREE T4
Free T4: 1.31 ng/dL (ref 0.82–1.77)
TSH: 1.16 u[IU]/mL (ref 0.450–4.500)

## 2019-02-01 NOTE — Telephone Encounter (Signed)
Pt is returning a call  

## 2019-02-01 NOTE — Telephone Encounter (Signed)
Returned patients call.  She states she has a lot going on and will call back in May to schedule her colonoscopy.  Thanks [employer]

## 2019-02-02 ENCOUNTER — Other Ambulatory Visit: Payer: Self-pay | Admitting: Obstetrics and Gynecology

## 2019-02-03 LAB — CALCIUM, URINE, 24 HOUR
Calcium, 24H Urine: 226 mg/24 hr (ref 47–462)
Calcium, Urine: 11.3 mg/dL

## 2019-02-03 NOTE — Progress Notes (Signed)
Called and discussed with patient

## 2019-02-03 NOTE — Progress Notes (Signed)
Patient called and discussed, she is taking vitamin D supplement.

## 2019-02-07 ENCOUNTER — Encounter: Payer: Self-pay | Admitting: *Deleted

## 2019-03-10 ENCOUNTER — Encounter: Payer: Self-pay | Admitting: Obstetrics and Gynecology

## 2019-03-10 NOTE — Progress Notes (Signed)
Patient ID: Brandi [name], female   DOB: 01/29/1956, 63 y.o.   MRN: [id number]  Reason for Consult: Follow-up   Referred by Jaclyn Shaggy, MD  Subjective:     HPI:  Brandi [name] is a 63 y.o. female she has been completing her screenings since her annual exam.  She has a low dose CT which was negative for lung masses but did show age advanced coronary artery atherosclerosis.  She had a cologuard which was positive. She has not yet scheduled a colonoscopy because she "has a lot going on." She understands the importance and says that she will complete it.  She had a mammogram which was BIRADS1 She had a Dexa scan which showed that she was osteoporotic. She is here today to discuss treatment option for osteoporosis.   History reviewed. No pertinent past medical history. Family History  Problem Relation Age of Onset  . Breast cancer Mother 50  . Alzheimer's disease Mother    Past Surgical History:  Procedure Laterality Date  . APPENDECTOMY    . CHOLECYSTECTOMY    . SHOULDER ARTHROSCOPY Left 07/02/2015   Procedure: ,left shoulder arthroscopy, decompression and debridement;  Surgeon: Christena Flake, MD;  Location: [facility] ORS;  Service: Orthopedics;  Laterality: Left;  . SHOULDER CLOSED REDUCTION Left 04/09/2015   Procedure: CLOSED MANIPULATION SHOULDER;  Surgeon: Kennedy Bucker, MD;  Location: [facility] ORS;  Service: Orthopedics;  Laterality: Left;    Short Social History:  Social History   Tobacco Use  . Smoking status: Former Smoker    Packs/day: 0.75    Years: 41.00    Pack years: 30.75    Types: E-cigarettes, Cigarettes    Last attempt to quit: 12/08/2015    Years since quitting: 3.2  . Smokeless tobacco: Never Used  Substance Use Topics  . Alcohol use: No    No Known Allergies  Current Outpatient Medications  Medication Sig Dispense Refill  . Calcium Carbonate-Vit D-Min (CALCIUM 1200) 1200-1000 MG-UNIT CHEW Chew 1,200 mg by mouth daily. 30 each 11  . Investigational vitamin D  600 UNITS capsule SWOG S0812 Take 1 capsule (600 Units total) by mouth daily. Take with food. 30 capsule 11   No current facility-administered medications for this visit.     Review of Systems  Constitutional: Negative for chills, fatigue, fever and unexpected weight change.  HENT: Negative for trouble swallowing.  Eyes: Negative for loss of vision.  Respiratory: Negative for cough, shortness of breath and wheezing.  Cardiovascular: Negative for chest pain, leg swelling, palpitations and syncope.  GI: Negative for abdominal pain, blood in stool, diarrhea, nausea and vomiting.  GU: Negative for difficulty urinating, dysuria, frequency and hematuria.  Musculoskeletal: Negative for back pain, leg pain and joint pain.  Skin: Negative for rash.  Neurological: Negative for dizziness, headaches, light-headedness, numbness and seizures.  Psychiatric: Negative for behavioral problem, confusion, depressed mood and sleep disturbance.        Objective:  Objective   Vitals:   01/31/19 1402  BP: 104/62  Pulse: 89  Weight: 127 lb (57.6 kg)  Height: 5\' 4"  (1.626 m)   Body mass index is 21.8 kg/m.  Physical Exam Vitals signs and nursing note reviewed.  Constitutional:      Appearance: She is well-developed.  HENT:     Head: Normocephalic and atraumatic.  Eyes:     Pupils: Pupils are equal, round, and reactive to light.  Cardiovascular:     Rate and Rhythm: Normal rate and regular rhythm.  Pulmonary:     Effort: Pulmonary effort is normal. No respiratory distress.  Abdominal:     General: Abdomen is flat.     Palpations: Abdomen is soft.  Skin:    General: Skin is warm and dry.  Neurological:     Mental Status: She is alert and oriented to person, place, and time.  Psychiatric:        Behavior: Behavior normal.        Thought Content: Thought content normal.        Judgment: Judgment normal.         Assessment/Plan:     63 yo   1. Referral to cardiology for age advanced  coronary artery atherosclerosis. 2.Osteoporosis- will send labs for secondary osteoporosis evaluation. Will start Vitamin D and calcium supplementation. Will refer for treatment with bisphosphonate's.  3. Emphasized the importance of having the colonoscopy performed. She understands that there is the possibility of colon cancer that is not being addressed.  4. Continue yearly CT of lungs 5. Continue yearly mammograms.     More than 40 minutes were spent face to face with the patient in the room with more than 50% of the time spent providing counseling and discussing the plan of management.    Adelene Idler MD [facility], [facility] 03/10/2019 8:36 AM

## 2019-05-19 ENCOUNTER — Telehealth: Payer: Self-pay

## 2019-05-19 NOTE — Telephone Encounter (Signed)

## 2019-05-21 DIAGNOSIS — Z87891 Personal history of nicotine dependence: Secondary | ICD-10-CM | POA: Insufficient documentation

## 2019-05-21 DIAGNOSIS — I2510 Atherosclerotic heart disease of native coronary artery without angina pectoris: Secondary | ICD-10-CM | POA: Insufficient documentation

## 2019-05-21 DIAGNOSIS — I700 Atherosclerosis of aorta: Secondary | ICD-10-CM | POA: Insufficient documentation

## 2019-05-21 DIAGNOSIS — E785 Hyperlipidemia, unspecified: Secondary | ICD-10-CM | POA: Insufficient documentation

## 2019-05-21 NOTE — Progress Notes (Signed)
Cardiology Office Note  Date:  05/22/2019   ID:  Brandi [name], DOB May 06, 1956, MRN [id number]  PCP:  Albina Billet, MD   Chief Complaint  Patient presents with  . New Patient (Initial Visit)    Patient denies chest pain and SOB at this time. Meds reviewed verbally with patient.     HPI:  [name] is a 63 year old woman with past medical history of Former smoker Aortic atherosclerosis, coronary calcification on CT Referred by Annia Belt for atherosclerosis  CT scan January 2020 Images pulled up in the office and reviewed with her Multivessel coronary artery atherosclerosis noted Aortic atherosclerosis CT scan documenting moderate emphysema, bullae  Atherosclerosis is mild in the arch Coronary artery calcification appears moderate  Total cholesterol 190 LDL 118  Denies any chest pain on exertion Sometimes will have some shortness of breath on heavy exertion but quickly resolves if she rests  Does not have an inhaler  PMH:    Aortic atherosclerosis Coronary calcification Smoker   PSH:    Past Surgical History:  Procedure Laterality Date  . APPENDECTOMY    . CHOLECYSTECTOMY    . SHOULDER ARTHROSCOPY Left 07/02/2015   Procedure: ,left shoulder arthroscopy, decompression and debridement;  Surgeon: Corky Mull, MD;  Location: [facility] ORS;  Service: Orthopedics;  Laterality: Left;  . SHOULDER CLOSED REDUCTION Left 04/09/2015   Procedure: CLOSED MANIPULATION SHOULDER;  Surgeon: Hessie Knows, MD;  Location: [facility] ORS;  Service: Orthopedics;  Laterality: Left;    Current Outpatient Medications  Medication Sig Dispense Refill  . Calcium Carbonate-Vit D-Min (CALCIUM 1200) 1200-1000 MG-UNIT CHEW Chew 1,200 mg by mouth daily. 30 each 11  . Investigational vitamin D 600 UNITS capsule SWOG S0812 Take 1 capsule (600 Units total) by mouth daily. Take with food. 30 capsule 11   No current facility-administered medications for this visit.     Allergies:   Patient has  no known allergies.   Social History:  The patient  reports that she quit smoking about 3 years ago. Her smoking use included e-cigarettes and cigarettes. She has a 30.75 pack-year smoking history. She has never used smokeless tobacco. She reports that she does not drink alcohol or use drugs.   Family History:   family history includes Alzheimer's disease in her mother; Breast cancer (age of onset: 83) in her mother.    Review of Systems: Review of Systems  Constitutional: Negative.   Respiratory: Positive for shortness of breath.   Cardiovascular: Negative.   Gastrointestinal: Negative.   Musculoskeletal: Negative.   Neurological: Negative.   Psychiatric/Behavioral: Negative.   All other systems reviewed and are negative.   PHYSICAL EXAM: VS:  BP 124/80 (BP Location: Right Arm, Patient Position: Sitting, Cuff Size: Normal)   Pulse 89   Ht 5\' 4"  (1.626 m)   Wt 128 lb 8 oz (58.3 kg)   BMI 22.06 kg/m  , BMI Body mass index is 22.06 kg/m. GEN: Well nourished, well developed, in no acute distress HEENT: normal Neck: no JVD, carotid bruits, or masses Cardiac: RRR; no murmurs, rubs, or gallops,no edema  Respiratory:  clear to auscultation bilaterally, normal work of breathing GI: soft, nontender, nondistended, + BS MS: no deformity or atrophy Skin: warm and dry, no rash Neuro:  Strength and sensation are intact Psych: euthymic mood, full affect   Recent Labs: 11/10/2018: ALT 13; BUN 7; Creatinine, Ser 0.71; Hemoglobin 14.1; Platelets 287; Potassium 4.1; Sodium 140 01/31/2019: TSH 1.160    Lipid Panel Lab Results  Component Value Date   CHOL 190 11/10/2018   HDL 59 11/10/2018   LDLCALC 118 (H) 11/10/2018   TRIG 64 11/10/2018      Wt Readings from Last 3 Encounters:  05/22/19 128 lb 8 oz (58.3 kg)  01/31/19 127 lb (57.6 kg)  12/29/18 123 lb (55.8 kg)       ASSESSMENT AND PLAN:  Aortic atherosclerosis (HCC) -  Recommended cholesterol medication, goal LDL less  than 70 Reports that she stop smoking  Coronary artery calcification seen on CT scan -  Images pulled up and discussed with her in detail Long discussion concerning ischemia and need for stress testing if she has any worsening shortness of breath She will call us for any change in her symptoms, she currently reports that she feels fine with no complaints apart from some shortness of breath 81 mg aspirin daily  Former smoker -  Stop smoking at least 2 years ago Likely a major factor in her coronary calcification and aortic atherosclerosis  Mixed hyperlipidemia - Recommended goal LDL less than 70, we have started Crestor 10 mg daily We have ordered a liver and lipid in 3 months time  Emphysema Moderate on CT scan We have given her prescription for albuterol that she can take as needed for shortness of breath on exertion   Disposition:   F/U  12 months   Orders Placed This Encounter  Procedures  . EKG 12-Lead     Signed, Brandi [name], M.D., Ph.D. 05/22/2019  [facility] [place] [phone]

## 2019-05-22 ENCOUNTER — Encounter

## 2019-05-22 ENCOUNTER — Ambulatory Visit (INDEPENDENT_AMBULATORY_CARE_PROVIDER_SITE_OTHER): Payer: Medicare HMO | Admitting: Cardiovascular Disease

## 2019-05-22 ENCOUNTER — Encounter: Payer: Self-pay | Admitting: Cardiovascular Disease

## 2019-05-22 ENCOUNTER — Other Ambulatory Visit: Payer: Self-pay

## 2019-05-22 VITALS — BP 124/80 | HR 89 | Ht 64.0 in | Wt 128.5 lb

## 2019-05-22 DIAGNOSIS — I2510 Atherosclerotic heart disease of native coronary artery without angina pectoris: Secondary | ICD-10-CM | POA: Diagnosis not present

## 2019-05-22 DIAGNOSIS — Z87891 Personal history of nicotine dependence: Secondary | ICD-10-CM

## 2019-05-22 DIAGNOSIS — J432 Centrilobular emphysema: Secondary | ICD-10-CM | POA: Diagnosis not present

## 2019-05-22 DIAGNOSIS — I700 Atherosclerosis of aorta: Secondary | ICD-10-CM

## 2019-05-22 DIAGNOSIS — E782 Mixed hyperlipidemia: Secondary | ICD-10-CM

## 2019-05-22 MED ORDER — ROSUVASTATIN CALCIUM 10 MG PO TABS: 10.0000 mg | ORAL_TABLET | Freq: Every day | ORAL | 3 refills | Status: DC

## 2019-05-22 MED ORDER — ASPIRIN EC 81 MG PO TBEC: 81.0000 mg | DELAYED_RELEASE_TABLET | Freq: Every day | ORAL | 3 refills | Status: AC

## 2019-05-22 MED ORDER — ALBUTEROL SULFATE HFA 108 (90 BASE) MCG/ACT IN AERS: 2.0000 | INHALATION_SPRAY | Freq: Four times a day (QID) | RESPIRATORY_TRACT | 0 refills | Status: DC | PRN

## 2019-05-22 NOTE — Patient Instructions (Addendum)
For worsening shortness of breath Call the office    Medication Instructions:  Your physician has recommended you make the following change in your medication:  1. START Rosuvastatin 10 mg once daily for cholesterol 2. START Aspirin 81 mg once daily 3. AS NEEDED Albuterol inhaler for shortness of breath   If you need a refill on your cardiac medications before your next appointment, please call your pharmacy.    Lab work: Lipid and Liver function test to be done in 3 months around September. Make sure to not eat or drink anything after midnight prior except water with pills.    If you have labs (blood work) drawn today and your tests are completely normal, you will receive your results only by: Marland Kitchen MyChart Message (if you have MyChart) OR . A paper copy in the mail If you have any lab test that is abnormal or we need to change your treatment, we will call you to review the results.   Testing/Procedures: No new testing needed   Follow-Up: At [facility], you and your health needs are our priority.  As part of our continuing mission to provide you with exceptional heart care, we have created designated Provider Care Teams.  These Care Teams include your primary Cardiologist (physician) and Advanced Practice Providers (APPs -  Physician Assistants and Nurse Practitioners) who all work together to provide you with the care you need, when you need it.  . You will need a follow up appointment in 12 months .   Please call our office 2 months in advance to schedule this appointment.    . Providers on your designated Care Team:   . Murray Hodgkins, NP . Christell Faith, PA-C . Marrianne Mood, PA-C  Any Other Special Instructions Will Be Listed Below (If Applicable).  For educational health videos Log in to : [email or web address] Or : [email or web address], password : [email or web address]

## 2019-12-25 ENCOUNTER — Telehealth: Payer: Self-pay | Admitting: *Deleted

## 2019-12-25 NOTE — Telephone Encounter (Signed)
Contacted in attempt to schedule lung screening scan. However due to covid concerns patient would like to wait until Spring to have scan.

## 2020-03-01 ENCOUNTER — Telehealth: Payer: Self-pay | Admitting: *Deleted

## 2020-03-01 NOTE — Telephone Encounter (Signed)
(  03/01/20) Left message for pt to notify them that it is time to schedule annual low dose lung cancer screening CT scan. Instructed patient to call back to verify information prior to the scan being scheduled SRW     

## 2020-03-12 ENCOUNTER — Telehealth: Payer: Self-pay | Admitting: *Deleted

## 2020-03-12 DIAGNOSIS — Z87891 Personal history of nicotine dependence: Secondary | ICD-10-CM

## 2020-03-12 NOTE — Telephone Encounter (Signed)
(  03/12/20) Pt has been notified that lung cancer screening CT scan is due currently or will be in near future. Confirmed pt is within appropriate age range, and asymptomatic. Pt denies illness that would prevent curative treatment for lung cancer if found. Verified smoking history (Former Smoker since 2017, 0.75 ppd). Pt is agreeable for CT scan being scheduled, says she has another appt on April 26th @ 8 am and would like to get the scan done on that day as well.  SRW

## 2020-03-13 NOTE — Addendum Note (Signed)
Addended by: Jonne Ply on: 03/13/2020 01:21 PM   Modules accepted: Orders

## 2020-03-13 NOTE — Telephone Encounter (Signed)
Smoking history: former, quit 12/08/15, 30.75 pack year

## 2020-03-30 NOTE — Progress Notes (Signed)
Cardiology Office Note  Date:  04/01/2020   ID:  Brandi [name], DOB 1956-08-07, MRN [id number]  PCP:  Jaclyn Shaggy, MD   Chief Complaint  Patient presents with  . office visit    SOB with exertion and after eating; Meds verbally reviewed with patient.    HPI:  [name] is a 64 year old woman with past medical history of Former smoker, 31 years Aortic atherosclerosis, moderate multivessel coronary calcification on CT Who presents for follow-up of her atherosclerosis  On prior visit Sometimes will have some shortness of breath on heavy exertion but quickly resolves if she rests  On todays visit,  Had SOB over winter, possible congestion [place], takes care of 4 hours  Sometimes SOB worse than others  Wonders if it could be lungs  Has been scheduled for CT chest lung cancer screening  No recent lipid panel available  CT scan January 2020 Multivessel coronary artery atherosclerosis noted Aortic atherosclerosis CT scan documenting moderate emphysema, bullae Atherosclerosis is mild in the arch Coronary artery calcification appears moderate  Total cholesterol 190 LDL 118 New labs ordered  EKG personally reviewed by myself on todays visit NSR rate 86 bpm, no ST or T changes  PMH:    Aortic atherosclerosis Coronary calcification Smoker   PSH:    Past Surgical History:  Procedure Laterality Date  . APPENDECTOMY    . CHOLECYSTECTOMY    . SHOULDER ARTHROSCOPY Left 07/02/2015   Procedure: ,left shoulder arthroscopy, decompression and debridement;  Surgeon: Christena Flake, MD;  Location: [facility] ORS;  Service: Orthopedics;  Laterality: Left;  . SHOULDER CLOSED REDUCTION Left 04/09/2015   Procedure: CLOSED MANIPULATION SHOULDER;  Surgeon: Kennedy Bucker, MD;  Location: [facility] ORS;  Service: Orthopedics;  Laterality: Left;    Current Outpatient Medications  Medication Sig Dispense Refill  . aspirin EC 81 MG tablet Take 1 tablet (81 mg total) by mouth daily. 90  tablet 3  . Calcium Carbonate-Vit D-Min (CALCIUM 1200) 1200-1000 MG-UNIT CHEW Chew 1,200 mg by mouth daily. 30 each 11  . Investigational vitamin D 600 UNITS capsule SWOG S0812 Take 1 capsule (600 Units total) by mouth daily. Take with food. (Patient taking differently: Take 600 Units by mouth daily. Take with food. Takes occasionally.) 30 capsule 11  . rosuvastatin (CRESTOR) 10 MG tablet Take 1 tablet (10 mg total) by mouth daily. 90 tablet 3  . albuterol (VENTOLIN HFA) 108 (90 Base) MCG/ACT inhaler Inhale 2 puffs into the lungs every 6 (six) hours as needed for wheezing or shortness of breath. (Patient not taking: Reported on 04/01/2020) 1 Inhaler 0   No current facility-administered medications for this visit.    Allergies:   Patient has no known allergies.   Social History:  The patient  reports that she quit smoking about 4 years ago. Her smoking use included e-cigarettes and cigarettes. She has a 30.75 pack-year smoking history. She has never used smokeless tobacco. She reports that she does not drink alcohol or use drugs.   Family History:   family history includes Alzheimer's disease in her mother; Breast cancer (age of onset: 47) in her mother.    Review of Systems: Review of Systems  Constitutional: Negative.   Respiratory: Positive for shortness of breath.   Cardiovascular: Negative.   Gastrointestinal: Negative.   Musculoskeletal: Negative.   Neurological: Negative.   Psychiatric/Behavioral: Negative.   All other systems reviewed and are negative.   PHYSICAL EXAM: VS:  BP 120/82 (BP Location: Left Arm, Patient  Position: Sitting, Cuff Size: Normal)   Pulse 86   Ht 5\' 4"  (1.626 m)   Wt 127 lb 2 oz (57.7 kg)   SpO2 96%   BMI 21.82 kg/m  , BMI Body mass index is 21.82 kg/m. Constitutional:  oriented to person, place, and time. No distress.  HENT:  Head: Grossly normal Eyes:  no discharge. No scleral icterus.  Neck: No JVD, no carotid bruits  Cardiovascular: Regular  rate and rhythm, no murmurs appreciated Pulmonary/Chest: Clear to auscultation bilaterally, no wheezes or rails Abdominal: Soft.  no distension.  no tenderness.  Musculoskeletal: Normal range of motion Neurological:  normal muscle tone. Coordination normal. No atrophy Skin: Skin warm and dry Psychiatric: normal affect, pleasant   Recent Labs: No results found for requested labs within last 8760 hours.    Lipid Panel Lab Results  Component Value Date   CHOL 190 11/10/2018   HDL 59 11/10/2018   LDLCALC 118 (H) 11/10/2018   TRIG 64 11/10/2018      Wt Readings from Last 3 Encounters:  04/01/20 127 lb 2 oz (57.7 kg)  05/22/19 128 lb 8 oz (58.3 kg)  01/31/19 127 lb (57.6 kg)      ASSESSMENT AND PLAN:  Aortic atherosclerosis (HCC) -   lipid panel Recommended cholesterol medication, goal LDL less than 70 Reports that she stop smoking  Coronary artery calcification seen on CT scan -  Recommend she call for any anginal symptoms 81 mg aspirin daily  Former smoker -  Smoked for 31 years, stopped 2 to 3 years ago Likely a major factor in her coronary calcification and aortic atherosclerosis  Mixed hyperlipidemia - Continue Crestor 10 mg daily Lipid panel today  SOB/Emphysema Moderate on CT scan Albuterol as needed CT scan scheduled today If getting worse, will order a stress test   Total encounter time more than 25 minutes  Greater than 50% was spent in counseling and coordination of care with the patient   Disposition:   F/U  12 months   Orders Placed This Encounter  Procedures  . EKG 12-Lead     Signed, Esmond Plants, M.D., Ph.D. 04/01/2020  [place], [place]

## 2020-04-01 ENCOUNTER — Encounter: Payer: Self-pay | Admitting: Cardiovascular Disease

## 2020-04-01 ENCOUNTER — Ambulatory Visit
Admission: RE | Admit: 2020-04-01 | Discharge: 2020-04-01 | Disposition: A | Discharge status: Home / Self Care | Payer: Medicare HMO | Source: Ambulatory Visit | Attending: Oncology | Admitting: Oncology

## 2020-04-01 ENCOUNTER — Other Ambulatory Visit: Payer: Self-pay

## 2020-04-01 ENCOUNTER — Ambulatory Visit (INDEPENDENT_AMBULATORY_CARE_PROVIDER_SITE_OTHER): Payer: Medicare HMO | Admitting: Cardiovascular Disease

## 2020-04-01 VITALS — BP 120/82 | HR 86 | Ht 64.0 in | Wt 127.1 lb

## 2020-04-01 DIAGNOSIS — Z87891 Personal history of nicotine dependence: Secondary | ICD-10-CM

## 2020-04-01 DIAGNOSIS — I2510 Atherosclerotic heart disease of native coronary artery without angina pectoris: Secondary | ICD-10-CM | POA: Diagnosis not present

## 2020-04-01 DIAGNOSIS — E782 Mixed hyperlipidemia: Secondary | ICD-10-CM

## 2020-04-01 DIAGNOSIS — I700 Atherosclerosis of aorta: Secondary | ICD-10-CM | POA: Diagnosis not present

## 2020-04-01 DIAGNOSIS — J432 Centrilobular emphysema: Secondary | ICD-10-CM

## 2020-04-01 MED ORDER — ALBUTEROL SULFATE HFA 108 (90 BASE) MCG/ACT IN AERS: 2.0000 | INHALATION_SPRAY | Freq: Four times a day (QID) | RESPIRATORY_TRACT | 1 refills | Status: DC | PRN

## 2020-04-01 MED ORDER — ROSUVASTATIN CALCIUM 10 MG PO TABS: 10.0000 mg | ORAL_TABLET | Freq: Every day | ORAL | 4 refills | Status: DC

## 2020-04-01 NOTE — Patient Instructions (Addendum)
If shortness of breath gets worse, Call the office  Dr. Althea Charon [phone]  Medication Instructions:  No changes  If you need a refill on your cardiac medications before your next appointment, please call your pharmacy.    Lab work: Labs: lipids LFTs today   If you have labs (blood work) drawn today and your tests are completely normal, you will receive your results only by: Marland Kitchen MyChart Message (if you have MyChart) OR . A paper copy in the mail If you have any lab test that is abnormal or we need to change your treatment, we will call you to review the results.   Testing/Procedures: No new testing needed   Follow-Up: At [facility], you and your health needs are our priority.  As part of our continuing mission to provide you with exceptional heart care, we have created designated Provider Care Teams.  These Care Teams include your primary Cardiologist (physician) and Advanced Practice Providers (APPs -  Physician Assistants and Nurse Practitioners) who all work together to provide you with the care you need, when you need it.  . You will need a follow up appointment in 12 months    . Providers on your designated Care Team:   . Nicolasa Ducking, NP . Eula Listen, PA-C . Marisue Ivan, PA-C  Any Other Special Instructions Will Be Listed Below (If Applicable).  For educational health videos Log in to : [email or web address] Or : [email or web address], password : [email or web address]

## 2020-04-02 ENCOUNTER — Telehealth: Payer: Self-pay | Admitting: *Deleted

## 2020-04-02 LAB — HEPATIC FUNCTION PANEL
ALT: 9 IU/L (ref 0–32)
AST: 18 IU/L (ref 0–40)
Albumin: 4.3 g/dL (ref 3.8–4.8)
Alkaline Phosphatase: 68 IU/L (ref 39–117)
Bilirubin Total: 0.2 mg/dL (ref 0.0–1.2)
Bilirubin, Direct: 0.09 mg/dL (ref 0.00–0.40)
Total Protein: 6.3 g/dL (ref 6.0–8.5)

## 2020-04-02 LAB — LIPID PANEL
Chol/HDL Ratio: 2.6 ratio (ref 0.0–4.4)
Cholesterol, Total: 145 mg/dL (ref 100–199)
HDL: 55 mg/dL (ref >39)
LDL Chol Calc (NIH): 79 mg/dL (ref 0–99)
Triglycerides: 51 mg/dL (ref 0–149)
VLDL Cholesterol Cal: 11 mg/dL (ref 5–40)

## 2020-04-02 NOTE — Telephone Encounter (Signed)
Notified patient of LDCT lung cancer screening program results with recommendation for 12 month follow up imaging. Also notified of incidental findings noted below and is encouraged to discuss further with PCP who will receive a copy of this note and/or the CT report. Patient verbalizes understanding.   IMPRESSION: 1. Lung-RADS 2, benign appearance or behavior. Continue annual screening with low-dose chest CT without contrast in 12 months. 2. Aortic atherosclerosis (ICD10-I70.0). Coronary artery calcification. 3.  Emphysema (ICD10-J43.

## 2020-04-08 ENCOUNTER — Ambulatory Visit (INDEPENDENT_AMBULATORY_CARE_PROVIDER_SITE_OTHER): Payer: Medicare HMO | Admitting: Family Medicine

## 2020-04-08 ENCOUNTER — Encounter: Payer: Self-pay | Admitting: Family Medicine

## 2020-04-08 ENCOUNTER — Other Ambulatory Visit: Payer: Self-pay

## 2020-04-08 VITALS — BP 127/77 | HR 120 | Temp 97.8°F | Resp 16 | Ht 64.0 in | Wt 123.0 lb

## 2020-04-08 DIAGNOSIS — Z7689 Persons encountering health services in other specified circumstances: Secondary | ICD-10-CM

## 2020-04-08 DIAGNOSIS — I700 Atherosclerosis of aorta: Secondary | ICD-10-CM | POA: Diagnosis not present

## 2020-04-08 DIAGNOSIS — J432 Centrilobular emphysema: Secondary | ICD-10-CM | POA: Insufficient documentation

## 2020-04-08 DIAGNOSIS — J0100 Acute maxillary sinusitis, unspecified: Secondary | ICD-10-CM | POA: Diagnosis not present

## 2020-04-08 DIAGNOSIS — R911 Solitary pulmonary nodule: Secondary | ICD-10-CM | POA: Insufficient documentation

## 2020-04-08 DIAGNOSIS — E782 Mixed hyperlipidemia: Secondary | ICD-10-CM | POA: Diagnosis not present

## 2020-04-08 DIAGNOSIS — Z87891 Personal history of nicotine dependence: Secondary | ICD-10-CM

## 2020-04-08 MED ORDER — SULFAMETHOXAZOLE-TRIMETHOPRIM 800-160 MG PO TABS: 1.0000 | ORAL_TABLET | Freq: Two times a day (BID) | ORAL | 0 refills | Status: AC

## 2020-04-08 NOTE — Progress Notes (Signed)
Subjective:    Patient ID: Brandi [name], female    DOB: 06-14-1956, 64 y.o.   MRN: [id number]  Brandi [name] is a 64 y.o. female presenting on 04/08/2020 for Establish Care and Sinus Problem (as per patient has sinus drainage, productive cough,someSOB but denies chills or fever or bodyache --onset week )  Previous PCP Dr Dewaine Oats, she was not pleased with lack of test results from last year 2020.  Here to switch to [facility] office here for PCP.  HPI   Sinusitis, acute Reports symptoms of sinus drainage, worse in morning, >1 week now with worsening, with some facial sinus pressure. Similar to prior sinus infections treated with sulfa drug, she was not sure name or dosage, but it was at [employer] occasional cough associated. Denies any fever  Centrilobular Emphysema (COPD), Severe Former smoker. Long history of tobacco abuse. Quit 4+ years ago. No prior diagnosis of COPD or Emphysema based on her knowledge, however last CT scan in 2020 did show Emphysema, she says was not told result. She admits most days breathing well, worse is in winter with colder weather, she has rescue albuterol inhaler only PRN use but not often. Never on maintenance therapy. Not on oxgyen - Last scan CT chest lung screening done 03/2020, asking for result.  HYPERLIPIDEMIA: - Reports concerns with side effect on statin but taking intermittently. Last lipid panel 03/2020, controlled  - Currently taking Rosuvastatin 10mg  most days, tolerating well without side effects or myalgias   Health Maintenance: Not planning to get COVID19 vaccine.  Previously did Cologuard, abnormal, requested to do Colonoscopy, she delayed it due to COVID19 - she wants to discuss this with GYN at next visit.  Depression screen PHQ 2/9 04/08/2020  Decreased Interest 0  Down, Depressed, Hopeless 0  PHQ - 2 Score 0    Past Medical History:  Diagnosis Date  . Hyperlipidemia    Past Surgical History:  Procedure Laterality Date  .  APPENDECTOMY    . CHOLECYSTECTOMY    . SHOULDER ARTHROSCOPY Left 07/02/2015   Procedure: ,left shoulder arthroscopy, decompression and debridement;  Surgeon: 07/04/2015, MD;  Location: [facility] ORS;  Service: Orthopedics;  Laterality: Left;  . SHOULDER CLOSED REDUCTION Left 04/09/2015   Procedure: CLOSED MANIPULATION SHOULDER;  Surgeon: 06/09/2015, MD;  Location: [facility] ORS;  Service: Orthopedics;  Laterality: Left;   Social History   Socioeconomic History  . Marital status: Married    Spouse name: Not on file  . Number of children: Not on file  . Years of education: Not on file  . Highest education level: Not on file  Occupational History  . Not on file  Tobacco Use  . Smoking status: Former Smoker    Packs/day: 0.75    Years: 41.00    Pack years: 30.75    Types: E-cigarettes, Cigarettes    Quit date: 12/08/2015    Years since quitting: 4.3  . Smokeless tobacco: Former 02/05/2016 and Sexual Activity  . Alcohol use: No  . Drug use: No  . Sexual activity: Yes    Birth control/protection: Post-menopausal  Other Topics Concern  . Not on file  Social History Narrative  . Not on file   Social Determinants of Health   Financial Resource Strain:   . Difficulty of Paying Living Expenses:   Food Insecurity:   . Worried About Engineer, water in the Last Year:   . Programme researcher, broadcasting/film/video of Food in the Last Year:  Transportation Needs:   . Freight forwarder (Medical):   Marland Kitchen Lack of Transportation (Non-Medical):   Physical Activity:   . Days of Exercise per Week:   . Minutes of Exercise per Session:   Stress:   . Feeling of Stress :   Social Connections:   . Frequency of Communication with Friends and Family:   . Frequency of Social Gatherings with Friends and Family:   . Attends Religious Services:   . Active Member of Clubs or Organizations:   . Attends Banker Meetings:   Marland Kitchen Marital Status:   Intimate Partner Violence:   . Fear of Current or Ex-Partner:   .  Emotionally Abused:   Marland Kitchen Physically Abused:   . Sexually Abused:    Family History  Problem Relation Age of Onset  . Breast cancer Mother 23  . Alzheimer's disease Mother    Current Outpatient Medications on File Prior to Visit  Medication Sig  . albuterol (VENTOLIN HFA) 108 (90 Base) MCG/ACT inhaler Inhale 2 puffs into the lungs every 6 (six) hours as needed for wheezing or shortness of breath.  Marland Kitchen aspirin EC 81 MG tablet Take 1 tablet (81 mg total) by mouth daily.  . Calcium Carbonate-Vit D-Min (CALCIUM 1200) 1200-1000 MG-UNIT CHEW Chew 1,200 mg by mouth daily.  . Investigational vitamin D 600 UNITS capsule SWOG S0812 Take 1 capsule (600 Units total) by mouth daily. Take with food. (Patient taking differently: Take 600 Units by mouth daily. Take with food. Takes occasionally.)  . rosuvastatin (CRESTOR) 10 MG tablet Take 1 tablet (10 mg total) by mouth daily.   No current facility-administered medications on file prior to visit.    Review of Systems Per HPI unless specifically indicated above      Objective:    BP 127/77   Pulse (!) 120   Temp 97.8 F (36.6 C) (Temporal)   Resp 16   Ht 5\' 4"  (1.626 m)   Wt 123 lb (55.8 kg)   SpO2 100%   BMI 21.11 kg/m   Wt Readings from Last 3 Encounters:  04/08/20 123 lb (55.8 kg)  04/01/20 127 lb (57.6 kg)  04/01/20 127 lb 2 oz (57.7 kg)    Physical Exam Vitals and nursing note reviewed.  Constitutional:      General: She is not in acute distress.    Appearance: She is well-developed. She is not diaphoretic.     Comments: Well-appearing, comfortable, cooperative, thin  HENT:     Head: Normocephalic and atraumatic.     Comments: Maxillary sinus tender bilateral Eyes:     General:        Right eye: No discharge.        Left eye: No discharge.     Conjunctiva/sclera: Conjunctivae normal.  Neck:     Thyroid: No thyromegaly.  Cardiovascular:     Rate and Rhythm: Normal rate and regular rhythm.     Heart sounds: Normal heart  sounds. No murmur.  Pulmonary:     Effort: Pulmonary effort is normal. No respiratory distress.     Breath sounds: No wheezing or rales.     Comments: Reduced air movement diffusely lower lung fields on exam. Speaks full sentences. No cough Musculoskeletal:        General: Normal range of motion.     Cervical back: Normal range of motion and neck supple.  Lymphadenopathy:     Cervical: No cervical adenopathy.  Skin:    General: Skin is warm and  dry.     Findings: No erythema or rash.  Neurological:     Mental Status: She is alert and oriented to person, place, and time.  Psychiatric:        Behavior: Behavior normal.     Comments: Well groomed, good eye contact, normal speech and thoughts      I have personally reviewed the radiology report from 04/01/20 Low Dose CT Lung.  CLINICAL DATA:  Former smoker, quit 4 years ago, 31 pack-year history.  EXAM: CT CHEST WITHOUT CONTRAST LOW-DOSE FOR LUNG CANCER SCREENING  TECHNIQUE: Multidetector CT imaging of the chest was performed following the standard protocol without IV contrast.  COMPARISON:  12/29/2018.  FINDINGS: Cardiovascular: Atherosclerotic calcification of the aorta and coronary arteries. Heart size normal. No pericardial effusion.  Mediastinum/Nodes: No pathologically enlarged mediastinal or axillary lymph nodes. Hilar regions are difficult to evaluate without IV contrast but appear grossly unremarkable. Esophagus is grossly unremarkable.  Lungs/Pleura: Severe centrilobular emphysema. There is a new 3.9 mm nodule in the posterior left upper lobe (3/84). No additional pulmonary nodules. No pleural fluid. Adherent debris in the airway.  Upper Abdomen: Visualized portions of the liver, adrenal glands, kidneys, spleen, pancreas, stomach and bowel are grossly unremarkable. Cholecystectomy. No upper abdominal adenopathy.  Musculoskeletal: No worrisome lytic or sclerotic lesions. T7 compression fracture is  unchanged.  IMPRESSION: 1. Lung-RADS 2, benign appearance or behavior. Continue annual screening with low-dose chest CT without contrast in 12 months. 2. Aortic atherosclerosis (ICD10-I70.0). Coronary artery calcification. 3.  Emphysema (ICD10-J43.9).   Electronically Signed   By: Leanna Battles M.D.   On: 04/01/2020 13:11  Results for orders placed or performed in visit on 04/01/20  Lipid panel  Result Value Ref Range   Cholesterol, Total 145 100 - 199 mg/dL   Triglycerides 51 0 - 149 mg/dL   HDL 55 >38 mg/dL   VLDL Cholesterol Cal 11 5 - 40 mg/dL   LDL Chol Calc (NIH) 79 0 - 99 mg/dL   Chol/HDL Ratio 2.6 0.0 - 4.4 ratio  Hepatic function panel  Result Value Ref Range   Total Protein 6.3 6.0 - 8.5 g/dL   Albumin 4.3 3.8 - 4.8 g/dL   Bilirubin Total 0.2 0.0 - 1.2 mg/dL   Bilirubin, Direct 7.56 0.00 - 0.40 mg/dL   Alkaline Phosphatase 68 39 - 117 IU/L   AST 18 0 - 40 IU/L   ALT 9 0 - 32 IU/L      Assessment & Plan:   Problem List Items Addressed This Visit    Nodule of upper lobe of left lung   Hyperlipidemia   Former smoker   Centrilobular emphysema (HCC) - Primary   Aortic atherosclerosis (HCC)    Other Visit Diagnoses    Acute non-recurrent maxillary sinusitis       Relevant Medications   sulfamethoxazole-trimethoprim (BACTRIM DS) 800-160 MG tablet   Encounter to establish care with new doctor          #Acute sinusitis Clinically with 1 week+ now acute sinusitis symptoms pain and pressure, congestion worse In past improved with bactrim, called [facility] was Bactrim_DS 800-160 BID x 5 days - will send re order rx on this now per her request. F/u if unresolved  #COPD/Emphysema Chronic severe emphysema on CT 03/2020 Former smoker Active symptoms at times worse in winter colder weather trigger Not on maintenance Discussion on Emphysema and treatment options She declines daily maintenance inhaler today. Offer Spiriva, she will consider and asked her to check  price w/ insurance, likely order in 6 months at next visit prior to winter. Keep on yearly LDCT screen, also LUL Pulm Nodule < 21mm, yearly check was benign last  #HLD Followed by Dr Rockey Situ, [place] St[facility] therapy Rosuvastatin 10mg , advised her that it is working well she should try to adhere to daily use otherwise if needs intermittent use can discuss with Cardiology as well in future.  Meds ordered this encounter  Medications  . sulfamethoxazole-trimethoprim (BACTRIM DS) 800-160 MG tablet    Sig: Take 1 tablet by mouth 2 (two) times daily for 5 days.    Dispense:  10 tablet    Refill:  0      Follow up plan: Return in about 6 months (around 10/09/2020) for 6 month follow-up COPD, new inhaler.  Nobie Putnam, DO [facility] 04/08/2020, 10:59 AM

## 2020-04-08 NOTE — Patient Instructions (Addendum)
Thank you for coming to the office today.  You do have Emphysema, weaker lungs, with reduced lung strength and elastic function.  I would recommend a daily inhaler, especially in the winter - we can try Spiriva once a day.  Check with insurance to see if they will pay for that one.   For sinuses, we will call [facility] and order the sulfa drug - stay tuned, within 24 hours.  I agree with Dr Mariah Milling to keep the cholesterol pill.   Please schedule a Follow-up Appointment to: Return in about 6 months (around 10/09/2020) for 6 month follow-up COPD, new inhaler.  If you have any other questions or concerns, please feel free to call the office or send a message through MyChart. You may also schedule an earlier appointment if necessary.  Additionally, you may be receiving a survey about your experience at our office within a few days to 1 week by e-mail or mail. We value your feedback.  [name], DO [facility] [place], [place]

## 2020-04-11 ENCOUNTER — Telehealth: Payer: Self-pay | Admitting: *Deleted

## 2020-04-11 MED ORDER — EZETIMIBE 10 MG PO TABS: 10.0000 mg | ORAL_TABLET | Freq: Every day | ORAL | 3 refills | Status: DC

## 2020-04-11 MED ORDER — ROSUVASTATIN CALCIUM 10 MG PO TABS: 20.0000 mg | ORAL_TABLET | Freq: Every day | ORAL | 0 refills | Status: DC

## 2020-04-11 NOTE — Telephone Encounter (Signed)
Spoke with patient and reviewed provider recommendations. She did just pick up prescription of Crestor 10 mg so she would like to take 2 tablets daily and see how she tolerates the increase before changing prescription to 20 mg pill. Instructed her to please call us if she doesn't have any problems and then I can send in new prescription. Also reviewed ezetimibe 10 mg once daily and will send that in for her as well. She verbalized understanding of our conversation, agreement with plan, and had no further questions at this time.

## 2020-04-11 NOTE — Telephone Encounter (Signed)
-----   Message from [name], MD sent at 04/10/2020  2:56 PM EDT ----- Cholesterol numbers are much improved  LDL still above goal Recommend increased Crestor up to 20 mg daily For any side effects would continue Crestor 10 add Zetia 10

## 2020-04-15 ENCOUNTER — Telehealth: Payer: Self-pay | Admitting: *Deleted

## 2020-04-15 MED ORDER — ROSUVASTATIN CALCIUM 20 MG PO TABS
20.0000 mg | ORAL_TABLET | Freq: Every day | ORAL | 3 refills | Status: DC
Start: 2020-04-15 — End: 2020-06-06

## 2020-04-15 NOTE — Telephone Encounter (Signed)
Spoke with patient and reviewed provider recommendations. She was already taking rosuvastatin 10 mg 2 tablets (20 mg) once daily along with ezetimibe 10 mg once daily. Advised that I could send in updated dosage so she only has to take 1 pill once she runs out of current prescription. She verbalized understanding of our conversation, agreement with plan, and had no further questions at this time.

## 2020-04-15 NOTE — Telephone Encounter (Signed)
-----   Message from [name], MD sent at 04/13/2020 10:17 PM EDT -----  Aram Beecham, I reviewed the images, As we suggested last week: "LDL still above goal Recommend increased Crestor up to 20 mg daily For any side effects would continue Crestor 10 add Zetia 10"  Thx TG ----- Message ----- From: Jonne Ply, RN Sent: 04/02/2020  11:27 AM EDT To: [name], MD  Dr. Mariah Milling, Ms. Youman asked me to share this imaging with you. Shawn

## 2020-04-15 NOTE — Telephone Encounter (Signed)
Left voicemail message to call back regarding results. 

## 2020-06-06 ENCOUNTER — Telehealth: Payer: Self-pay | Admitting: Cardiovascular Disease

## 2020-06-06 MED ORDER — ROSUVASTATIN CALCIUM 20 MG PO TABS: 20.0000 mg | ORAL_TABLET | Freq: Every day | ORAL | 3 refills | Status: DC

## 2020-06-06 NOTE — Telephone Encounter (Signed)
Could not find where anyone had called patient. She did ask about her ezetimibe and rosuvastatin. She thought the ezetimibe was to take the place of the rosuvastatin. Advised patient she is to take both ezetimibe 10 mg daily and rosuvastatin 20 mg daily. She verbalized understanding and is aware I have sent refill for rosuvastatin to her pharmacy.

## 2020-06-06 NOTE — Telephone Encounter (Signed)
Patient is returning the call. She is not sure who called her.

## 2020-09-09 ENCOUNTER — Telehealth: Payer: Self-pay | Admitting: Family Medicine

## 2020-09-09 NOTE — Telephone Encounter (Signed)
Copied from CRM [id number]. Topic: General - Other >> Sep 09, 2020 11:57 AM Wyonia Hough E wrote: Reason for CRM: Pt was bitten by fire ants on her foot and asked if Dr. Kirtland Bouchard can call in something for this / the bumps have blistered and itches and burns/ please advise   Pt stated this has happen before in her yard and her last provider should have it in her notes/chart

## 2020-09-10 ENCOUNTER — Ambulatory Visit: Payer: Medicare HMO | Admitting: Family Medicine

## 2020-09-10 NOTE — Telephone Encounter (Signed)
Appt 09/10/2020 

## 2020-09-10 NOTE — Telephone Encounter (Signed)
FYI - No show for appointment today.

## 2020-09-14 ENCOUNTER — Other Ambulatory Visit: Payer: Self-pay | Admitting: Cardiovascular Disease

## 2020-09-16 NOTE — Telephone Encounter (Signed)
Please advise if ok to refill non cardiac medication. 

## 2020-10-09 ENCOUNTER — Ambulatory Visit: Payer: Medicare HMO | Admitting: Family Medicine

## 2020-11-04 ENCOUNTER — Ambulatory Visit (INDEPENDENT_AMBULATORY_CARE_PROVIDER_SITE_OTHER): Payer: Medicare HMO | Admitting: Obstetrics and Gynecology

## 2020-11-04 ENCOUNTER — Encounter: Payer: Self-pay | Admitting: Obstetrics and Gynecology

## 2020-11-04 ENCOUNTER — Other Ambulatory Visit: Payer: Self-pay

## 2020-11-04 VITALS — BP 110/72 | Ht 64.8 in | Wt 124.8 lb

## 2020-11-04 DIAGNOSIS — Z01419 Encounter for gynecological examination (general) (routine) without abnormal findings: Secondary | ICD-10-CM

## 2020-11-04 DIAGNOSIS — Z1211 Encounter for screening for malignant neoplasm of colon: Secondary | ICD-10-CM | POA: Diagnosis not present

## 2020-11-04 DIAGNOSIS — M810 Age-related osteoporosis without current pathological fracture: Secondary | ICD-10-CM

## 2020-11-04 DIAGNOSIS — Z1231 Encounter for screening mammogram for malignant neoplasm of breast: Secondary | ICD-10-CM | POA: Diagnosis not present

## 2020-11-04 DIAGNOSIS — R195 Other fecal abnormalities: Secondary | ICD-10-CM

## 2020-11-04 MED ORDER — ALENDRONATE SODIUM 70 MG PO TABS: 70.0000 mg | ORAL_TABLET | ORAL | 11 refills | Status: DC

## 2020-11-04 NOTE — Progress Notes (Signed)
ANNUAL EXAM.

## 2020-11-04 NOTE — Progress Notes (Signed)
Gynecology Annual Exam  PCP: [name], DO  Chief Complaint:  Chief Complaint  Patient presents with  . Gynecologic Exam    ANNUAL EXAM    History of Present Illness: Patient is a 64 y.o. G1P1001 presents for annual exam. The patient has no complaints today.   LMP: No LMP recorded. Patient is postmenopausal. Denies postmenopausal bleeding  The patient is sexually active. She denies dyspareunia.  Postcoital Bleeding: no She currently uses none for contraception.    The patient does perform self breast exams.  There is notable family history of breast or ovarian cancer in her family.  The patient has regular exercise: yes, walking  The patient denies current symptoms of depression.    She reports that her husband has been in the hospital for heart surgery. She has been busy taking care of hime.  She had a history of a positive cologuard in the past. She does not want to be seen by GU for a colonoscopy yet. She prefers to repeat the cologuard at home on "my own time." She has a history of osteoporosis. She has not been on treatment yet. She tends to get overwhelmed and prefers to start things one at a time. She is open today to starting a medication to help with osteoporosis. She is doing once a year lung cancer screening with CT. Her last CT was 03/2020.    Review of Systems: Review of Systems  Constitutional: Negative for chills, fever, malaise/fatigue and weight loss.  HENT: Negative for congestion, hearing loss and sinus pain.   Eyes: Negative for blurred vision and double vision.  Respiratory: Negative for cough, sputum production, shortness of breath and wheezing.   Cardiovascular: Negative for chest pain, palpitations, orthopnea and leg swelling.  Gastrointestinal: Negative for abdominal pain, constipation, diarrhea, nausea and vomiting.  Genitourinary: Negative for dysuria, flank pain, frequency, hematuria and urgency.  Musculoskeletal: Negative for back  pain, falls and joint pain.  Skin: Negative for itching and rash.  Neurological: Negative for dizziness and headaches.  Psychiatric/Behavioral: Negative for depression, substance abuse and suicidal ideas. The patient is not nervous/anxious.     Past Medical History:  Past Medical History:  Diagnosis Date  . Hyperlipidemia     Past Surgical History:  Past Surgical History:  Procedure Laterality Date  . APPENDECTOMY    . CHOLECYSTECTOMY    . SHOULDER ARTHROSCOPY Left 07/02/2015   Procedure: ,left shoulder arthroscopy, decompression and debridement;  Surgeon: Christena Flake, MD;  Location: [facility];  Service: Orthopedics;  Laterality: Left;  . SHOULDER CLOSED REDUCTION Left 04/09/2015   Procedure: CLOSED MANIPULATION SHOULDER;  Surgeon: Kennedy Bucker, MD;  Location: [facility];  Service: Orthopedics;  Laterality: Left;    Gynecologic History:  No LMP recorded. Patient is postmenopausal. Last Pap: Results were: 2019 NIL  Last mammogram: 2020  Results were: BI-RAD I Obstetric History: G1P1001  Family History:  Family History  Problem Relation Age of Onset  . Breast cancer Mother 59  . Alzheimer's disease Mother     Social History:  Social History   Socioeconomic History  . Marital status: Married    Spouse name: Not on file  . Number of children: Not on file  . Years of education: Not on file  . Highest education level: Not on file  Occupational History  . Not on file  Tobacco Use  . Smoking status: Former Smoker    Packs/day: 0.75    Years: 41.00    Pack  years: 30.75    Types: E-cigarettes, Cigarettes    Quit date: 12/08/2015    Years since quitting: 4.9  . Smokeless tobacco: Former Clinical biochemist  . Vaping Use: Never used  Substance and Sexual Activity  . Alcohol use: No  . Drug use: No  . Sexual activity: Yes    Birth control/protection: Post-menopausal  Other Topics Concern  . Not on file  Social History Narrative  . Not on file   Social Determinants of  Health   Financial Resource Strain:   . Difficulty of Paying Living Expenses: Not on file  Food Insecurity:   . Worried About Programme researcher, broadcasting/film/video in the Last Year: Not on file  . Ran Out of Food in the Last Year: Not on file  Transportation Needs:   . Lack of Transportation (Medical): Not on file  . Lack of Transportation (Non-Medical): Not on file  Physical Activity:   . Days of Exercise per Week: Not on file  . Minutes of Exercise per Session: Not on file  Stress:   . Feeling of Stress : Not on file  Social Connections:   . Frequency of Communication with Friends and Family: Not on file  . Frequency of Social Gatherings with Friends and Family: Not on file  . Attends Religious Services: Not on file  . Active Member of Clubs or Organizations: Not on file  . Attends Banker Meetings: Not on file  . Marital Status: Not on file  Intimate Partner Violence:   . Fear of Current or Ex-Partner: Not on file  . Emotionally Abused: Not on file  . Physically Abused: Not on file  . Sexually Abused: Not on file    Allergies:  No Known Allergies  Medications: Prior to Admission medications   Medication Sig Start Date End Date Taking? Authorizing Provider  albuterol (VENTOLIN HFA) 108 (90 Base) MCG/ACT inhaler Inhale 2 puffs into the lungs every 6 (six) hours as needed for wheezing or shortness of breath. 04/01/20  Yes Gollan, Tollie Pizza, MD  aspirin EC 81 MG tablet Take 1 tablet (81 mg total) by mouth daily. 05/22/19  Yes Gollan, Tollie Pizza, MD  Calcium Carbonate-Vit D-Min (CALCIUM 1200) 1200-1000 MG-UNIT CHEW Chew 1,200 mg by mouth daily. 01/31/19  Yes Dustina Scoggin, Jaquelyn Bitter, MD  Investigational vitamin D 600 UNITS capsule SWOG [id number] Take 1 capsule (600 Units total) by mouth daily. Take with food. Patient taking differently: Take 600 Units by mouth daily. Take with food. Takes occasionally. 01/31/19  Yes Justina Bertini R, MD  ezetimibe (ZETIA) 10 MG tablet Take 1 tablet (10 mg  total) by mouth daily. 04/11/20 07/10/20  [name], MD  rosuvastatin (CRESTOR) 20 MG tablet Take 1 tablet (20 mg total) by mouth daily. 06/06/20 09/04/20  [name], MD    Physical Exam Vitals: Blood pressure 110/72, height 5' 4.8" (1.646 m), weight 124 lb 12.8 oz (56.6 kg).  Physical Exam Constitutional:      Appearance: She is well-developed.  Genitourinary:     Vagina and uterus normal.     No lesions in the vagina.     No cervical motion tenderness.     No right or left adnexal mass present.     Genitourinary Comments: External: Normal appearing vulva. No lesions noted.  Bimanual examination: Uterus midline, non-tender, normal in size, shape and contour.  No CMT. No adnexal masses. No adnexal tenderness. Pelvis not fixed.     HENT:  Head: Normocephalic and atraumatic.  Neck:     Thyroid: No thyromegaly.  Cardiovascular:     Rate and Rhythm: Normal rate and regular rhythm.     Heart sounds: Normal heart sounds.  Pulmonary:     Effort: Pulmonary effort is normal.     Breath sounds: Normal breath sounds.  Chest:     Breasts:        Right: No inverted nipple, mass, nipple discharge or skin change.        Left: No inverted nipple, mass, nipple discharge or skin change.  Abdominal:     General: Bowel sounds are normal. There is no distension.     Palpations: Abdomen is soft. There is no mass.  Musculoskeletal:     Cervical back: Neck supple.  Neurological:     Mental Status: She is alert and oriented to person, place, and time.  Skin:    General: Skin is warm and dry.  Psychiatric:        Behavior: Behavior normal.        Thought Content: Thought content normal.        Judgment: Judgment normal.  Vitals reviewed. Exam conducted with a chaperone present.      Female chaperone present for pelvic and breast  portions of the physical exam  Assessment: 64 y.o. G1P1001 routine annual exam  Plan: Problem List Items Addressed This Visit    None    Visit  Diagnoses    Breast cancer screening by mammogram    -  Primary   Relevant Orders   MM 3D SCREEN BREAST BILATERAL   Encounter for annual routine gynecological examination       Osteoporosis without current pathological fracture, unspecified osteoporosis type       Relevant Medications   alendronate (FOSAMAX) 70 MG tablet   Positive colorectal cancer screening using Cologuard test       Colon cancer screening       Relevant Orders   Cologuard   Encounter for gynecological examination without abnormal finding          1) Mammogram - recommend yearly screening mammogram.  Mammogram Was ordered today  2) STI screening was offered and declined  3) ASCCP guidelines and rational discussed.  Patient opts for every 3 years screening interval  4) Osteoporosis- start fosamax once a week.   5) Colonoscopy -history of postive cologuard. She declines GI referral for colonoscopy at this time. She would prefer to repeat the cologuard and if it is positive will then see GI specialist.   6) Routine healthcare maintenance including cholesterol, diabetes screening discussed managed by PCP.   7) Continue once a year low dose CT for lung cancer screening.    Adelene Idler MD, [facility] 11/04/2020 9:19 AM

## 2020-11-04 NOTE — Patient Instructions (Signed)
Institute of Medicine Recommended Dietary Allowances for Calcium and Vitamin D  Age (yr) Calcium Recommended Dietary Allowance (mg/day) Vitamin D Recommended Dietary Allowance (international units/day)  9-18 1,300 600  19-50 1,000 600  51-70 1,200 600  71 and older 1,200 800  Data from [employer] of Medicine. Dietary reference intakes: calcium, vitamin D. [place], [place]: National Academies Press; 2011.    Exercising to Stay Healthy To become healthy and stay healthy, it is recommended that you do moderate-intensity and vigorous-intensity exercise. You can tell that you are exercising at a moderate intensity if your heart starts beating faster and you start breathing faster but can still hold a conversation. You can tell that you are exercising at a vigorous intensity if you are breathing much harder and faster and cannot hold a conversation while exercising. Exercising regularly is important. It has many health benefits, such as:  Improving overall fitness, flexibility, and endurance.  Increasing bone density.  Helping with weight control.  Decreasing body fat.  Increasing muscle strength.  Reducing stress and tension.  Improving overall health. How often should I exercise? Choose an activity that you enjoy, and set realistic goals. Your health care provider can help you make an activity plan that works for you. Exercise regularly as told by your health care provider. This may include:  Doing strength training two times a week, such as: ? Lifting weights. ? Using resistance bands. ? Push-ups. ? Sit-ups. ? Yoga.  Doing a certain intensity of exercise for a given amount of time. Choose from these options: ? A total of 150 minutes of moderate-intensity exercise every week. ? A total of 75 minutes of vigorous-intensity exercise every week. ? A mix of moderate-intensity and vigorous-intensity exercise every week. Children, pregnant women, people who have not exercised  regularly, people who are overweight, and older adults may need to talk with a health care provider about what activities are safe to do. If you have a medical condition, be sure to talk with your health care provider before you start a new exercise program. What are some exercise ideas? Moderate-intensity exercise ideas include:  Walking 1 mile (1.6 km) in about 15 minutes.  Biking.  Hiking.  Golfing.  Dancing.  Water aerobics. Vigorous-intensity exercise ideas include:  Walking 4.5 miles (7.2 km) or more in about 1 hour.  Jogging or running 5 miles (8 km) in about 1 hour.  Biking 10 miles (16.1 km) or more in about 1 hour.  Lap swimming.  Roller-skating or in-line skating.  Cross-country skiing.  Vigorous competitive sports, such as football, basketball, and soccer.  Jumping rope.  Aerobic dancing. What are some everyday activities that can help me to get exercise?  Yard work, such as: ? Pushing a lawn mower. ? Raking and bagging leaves.  Washing your car.  Pushing a stroller.  Shoveling snow.  Gardening.  Washing windows or floors. How can I be more active in my day-to-day activities?  Use stairs instead of an elevator.  Take a walk during your lunch break.  If you drive, park your car farther away from your work or school.  If you take public transportation, get off one stop early and walk the rest of the way.  Stand up or walk around during all of your indoor phone calls.  Get up, stretch, and walk around every 30 minutes throughout the day.  Enjoy exercise with a friend. Support to continue exercising will help you keep a regular routine of activity. What guidelines can I   follow while exercising?  Before you start a new exercise program, talk with your health care provider.  Do not exercise so much that you hurt yourself, feel dizzy, or get very short of breath.  Wear comfortable clothes and wear shoes with good support.  Drink plenty of  water while you exercise to prevent dehydration or heat stroke.  Work out until your breathing and your heartbeat get faster. Where to find more information  [place] Department of Health and Human Services: www.hhs.gov  Centers for Disease Control and Prevention (CDC): www.cdc.gov Summary  Exercising regularly is important. It will improve your overall fitness, flexibility, and endurance.  Regular exercise also will improve your overall health. It can help you control your weight, reduce stress, and improve your bone density.  Do not exercise so much that you hurt yourself, feel dizzy, or get very short of breath.  Before you start a new exercise program, talk with your health care provider. This information is not intended to replace advice given to you by your health care provider. Make sure you discuss any questions you have with your health care provider. Document Revised: 11/05/2017 Document Reviewed: 10/14/2017 Elsevier Patient Education  2020 Elsevier Inc.   Budget-Friendly Healthy Eating There are many ways to save money at the grocery store and continue to eat healthy. You can be successful if you:  Plan meals according to your budget.  Make a grocery list and only purchase food according to your grocery list.  Prepare food yourself. What are tips for following this plan?  Reading food labels  Compare food labels between brand name foods and the store brand. Often the nutritional value is the same, but the store brand is lower cost.  Look for products that do not have added sugar, fat, or salt (sodium). These often cost the same but are healthier for you. Products may be labeled as: ? Sugar-free. ? Nonfat. ? Low-fat. ? Sodium-free. ? Low-sodium.  Look for lean ground beef labeled as at least 92% lean and 8% fat. Shopping  Buy only the items on your grocery list and go only to the areas of the store that have the items on your list.  Use coupons only for foods  and brands you normally buy. Avoid buying items you wouldn't normally buy simply because they are on sale.  Check online and in newspapers for weekly deals.  Buy healthy items from the bulk bins when available, such as herbs, spices, flour, pasta, nuts, and dried fruit.  Buy fruits and vegetables that are in season. Prices are usually lower on in-season produce.  Look at the unit price on the price tag. Use it to compare different brands and sizes to find out which item is the best deal.  Choose healthy items that are often low-cost, such as carrots, potatoes, apples, bananas, and oranges. Dried or canned beans are a low-cost protein source.  Buy in bulk and freeze extra food. Items you can buy in bulk include meats, fish, poultry, frozen fruits, and frozen vegetables.  Avoid buying "ready-to-eat" foods, such as pre-cut fruits and vegetables and pre-made salads.  If possible, shop around to discover where you can find the best prices. Consider other retailers such as dollar stores, larger wholesale stores, local fruit and vegetable stands, and farmers markets.  Do not shop when you are hungry. If you shop while hungry, it may be hard to stick to your list and budget.  Resist impulse buying. Use your grocery list as your   official plan for the week.  Buy a variety of vegetables and fruits by purchasing fresh, frozen, and canned items.  Look at the top and bottom shelves for deals. Foods at eye level (eye level of an adult or child) are usually more expensive.  Be efficient with your time when shopping. The more time you spend at the store, the more money you are likely to spend.  To save money when choosing more expensive foods like meats and dairy: ? Choose cheaper cuts of meat, such as bone-in chicken thighs and drumsticks instead of skinless and boneless chicken. When you are ready to prepare the chicken, you can remove the skin yourself to make it healthier. ? Choose lean meats like  chicken or [place] instead of beef. ? Choose canned seafood, such as tuna, salmon, or sardines. ? Buy eggs as a low-cost source of protein. ? Buy dried beans and peas, such as lentils, split peas, or kidney beans instead of meats. Dried beans and peas are a good alternative source of protein. ? Buy the larger tubs of yogurt instead of individual-sized containers.  Choose water instead of sodas and other sweetened beverages.  Avoid buying chips, cookies, and other "junk food." These items are usually expensive and not healthy. Cooking  Make extra food and freeze the extras in meal-sized containers or in individual portions for fast meals and snacks.  Pre-cook on days when you have extra time to prepare meals in advance. You can keep these meals in the fridge or freezer and reheat for a quick meal.  When you come home from the grocery store, wash, peel, and cut fruits and vegetables so they are ready to use and eat. This will help reduce food waste. Meal planning  Do not eat out or get fast food. Prepare food at home.  Make a grocery list and make sure to bring it with you to the store. If you have a smart phone, you could use your phone to create your shopping list.  Plan meals and snacks according to a grocery list and budget you create.  Use leftovers in your meal plan for the week.  Look for recipes where you can cook once and make enough food for two meals.  Include budget-friendly meals like stews, casseroles, and stir-fry dishes.  Try some meatless meals or try "no cook" meals like salads.  Make sure that half your plate is filled with fruits or vegetables. Choose from fresh, frozen, or canned fruits and vegetables. If eating canned, remember to rinse them before eating. This will remove any excess salt added for packaging. Summary  Eating healthy on a budget is possible if you plan your meals according to your budget, purchase according to your budget and grocery list, and  prepare food yourself.  Tips for buying more food on a limited budget include buying generic brands, using coupons only for foods you normally buy, and buying healthy items from the bulk bins when available.  Tips for buying cheaper food to replace expensive food include choosing cheaper, lean cuts of meat, and buying dried beans and peas. This information is not intended to replace advice given to you by your health care provider. Make sure you discuss any questions you have with your health care provider. Document Revised: 11/24/2017 Document Reviewed: 11/24/2017 Elsevier Patient Education  2020 [place] protect organs, store calcium, anchor muscles, and support the whole body. Keeping your bones strong is important, especially as you get  older. You can take actions to help keep your bones strong and healthy. Why is keeping my bones healthy important?  Keeping your bones healthy is important because your body constantly replaces bone cells. Cells get old, and new cells take their place. As we age, we lose bone cells because the body may not be able to make enough new cells to replace the old cells. The amount of bone cells and bone tissue you have is referred to as bone mass. The higher your bone mass, the stronger your bones. The aging process leads to an overall loss of bone mass in the body, which can increase the likelihood of:  Joint pain and stiffness.  Broken bones.  A condition in which the bones become weak and brittle (osteoporosis). A large decline in bone mass occurs in older adults. In women, it occurs about the time of menopause. What actions can I take to keep my bones healthy? Good health habits are important for maintaining healthy bones. This includes eating nutritious foods and exercising regularly. To have healthy bones, you need to get enough of the right minerals and vitamins. Most nutrition experts recommend getting these nutrients from the  foods that you eat. In some cases, taking supplements may also be recommended. Doing certain types of exercise is also important for bone health. What are the nutritional recommendations for healthy bones?  Eating a well-balanced diet with plenty of calcium and vitamin D will help to protect your bones. Nutritional recommendations vary from person to person. Ask your health care provider what is healthy for you. Here are some general guidelines. Get enough calcium Calcium is the most important (essential) mineral for bone health. Most people can get enough calcium from their diet, but supplements may be recommended for people who are at risk for osteoporosis. Good sources of calcium include:  Dairy products, such as low-fat or nonfat milk, cheese, and yogurt.  Dark green leafy vegetables, such as bok choy and broccoli.  Calcium-fortified foods, such as orange juice, cereal, bread, soy beverages, and tofu products.  Nuts, such as almonds. Follow these recommended amounts for daily calcium intake:  Children, age 77-3: 700 mg.  Children, age 35-8: 1,000 mg.  Children, age 84-13: 1,300 mg.  Teens, age 79-18: 1,300 mg.  Adults, age 64-50: 1,000 mg.  Adults, age 62-70: ? Men: 1,000 mg. ? Women: 1,200 mg.  Adults, age 50 or older: 1,200 mg.  Pregnant and breastfeeding females: ? Teens: 1,300 mg. ? Adults: 1,000 mg. Get enough vitamin D Vitamin D is the most essential vitamin for bone health. It helps the body absorb calcium. Sunlight stimulates the skin to make vitamin D, so be sure to get enough sunlight. If you live in a cold climate or you do not get outside often, your health care provider may recommend that you take vitamin D supplements. Good sources of vitamin D in your diet include:  Egg yolks.  Saltwater fish.  Milk and cereal fortified with vitamin D. Follow these recommended amounts for daily vitamin D intake:  Children and teens, age 77-18: 600 international  units.  Adults, age 27 or younger: 400-800 international units.  Adults, age 51 or older: 800-1,000 international units. Get other important nutrients Other nutrients that are important for bone health include:  Phosphorus. This mineral is found in meat, poultry, dairy foods, nuts, and legumes. The recommended daily intake for adult men and adult women is 700 mg.  Magnesium. This mineral is found in seeds, nuts, dark green  vegetables, and legumes. The recommended daily intake for adult men is 400-420 mg. For adult women, it is 310-320 mg.  Vitamin K. This vitamin is found in green leafy vegetables. The recommended daily intake is 120 mg for adult men and 90 mg for adult women. What type of physical activity is best for building and maintaining healthy bones? Weight-bearing and strength-building activities are important for building and maintaining healthy bones. Weight-bearing activities cause muscles and bones to work against gravity. Strength-building activities increase the strength of the muscles that support bones. Weight-bearing and muscle-building activities include:  Walking and hiking.  Jogging and running.  Dancing.  Gym exercises.  Lifting weights.  Tennis and racquetball.  Climbing stairs.  Aerobics. Adults should get at least 30 minutes of moderate physical activity on most days. Children should get at least 60 minutes of moderate physical activity on most days. Ask your health care provider what type of exercise is best for you. How can I find out if my bone mass is low? Bone mass can be measured with an X-ray test called a bone mineral density (BMD) test. This test is recommended for all women who are age 78 or older. It may also be recommended for:  Men who are age 82 or older.  People who are at risk for osteoporosis because of: ? Having bones that break easily. ? Having a long-term disease that weakens bones, such as kidney disease or rheumatoid  arthritis. ? Having menopause earlier than normal. ? Taking medicine that weakens bones, such as steroids, thyroid hormones, or hormone treatment for breast cancer or prostate cancer. ? Smoking. ? Drinking three or more alcoholic drinks a day. If you find that you have a low bone mass, you may be able to prevent osteoporosis or further bone loss by changing your diet and lifestyle. Where can I find more information? For more information, check out the following websites:  National Osteoporosis Foundation: https://carlson-fletcher.info/  [employer]: www.bones.http://www.myers.net/  International Osteoporosis Foundation: Investment banker, operational.iofbonehealth.org Summary  The aging process leads to an overall loss of bone mass in the body, which can increase the likelihood of broken bones and osteoporosis.  Eating a well-balanced diet with plenty of calcium and vitamin D will help to protect your bones.  Weight-bearing and strength-building activities are also important for building and maintaining strong bones.  Bone mass can be measured with an X-ray test called a bone mineral density (BMD) test. This information is not intended to replace advice given to you by your health care provider. Make sure you discuss any questions you have with your health care provider. Document Revised: 12/20/2017 Document Reviewed: 12/20/2017 Elsevier Patient Education  2020 Elsevier Inc.  Alendronate tablets What is this medicine? ALENDRONATE (a LEN droe nate) slows calcium loss from bones. It helps to make normal healthy bone and to slow bone loss in people with Paget's disease and osteoporosis. It may be used in others at risk for bone loss. This medicine may be used for other purposes; ask your health care provider or pharmacist if you have questions. COMMON BRAND NAME(S): Fosamax What should I tell my health care provider before I take this medicine? They need to know if you have any of these conditions:  dental  disease  esophagus, stomach, or intestine problems, like acid reflux or GERD  kidney disease  low blood calcium  low vitamin D  problems sitting or standing 30 minutes  trouble swallowing  an unusual or allergic reaction to alendronate, other  medicines, foods, dyes, or preservatives  pregnant or trying to get pregnant  breast-feeding How should I use this medicine? You must take this medicine exactly as directed or you will lower the amount of the medicine you absorb into your body or you may cause yourself harm. Take this medicine by mouth first thing in the morning, after you are up for the day. Do not eat or drink anything before you take your medicine. Swallow the tablet with a full glass (6 to 8 fluid ounces) of plain water. Do not take this medicine with any other drink. Do not chew or crush the tablet. After taking this medicine, do not eat breakfast, drink, or take any medicines or vitamins for at least 30 minutes. Sit or stand up for at least 30 minutes after you take this medicine; do not lie down. Do not take your medicine more often than directed. Talk to your pediatrician regarding the use of this medicine in children. Special care may be needed. Overdosage: If you think you have taken too much of this medicine contact a poison control center or emergency room at once. NOTE: This medicine is only for you. Do not share this medicine with others. What if I miss a dose? If you miss a dose, do not take it later in the day. Continue your normal schedule starting the next morning. Do not take double or extra doses. What may interact with this medicine?  aluminum hydroxide  antacids  aspirin  calcium supplements  drugs for inflammation like ibuprofen, naproxen, and others  iron supplements  magnesium supplements  vitamins with minerals This list may not describe all possible interactions. Give your health care provider a list of all the medicines, herbs,  non-prescription drugs, or dietary supplements you use. Also tell them if you smoke, drink alcohol, or use illegal drugs. Some items may interact with your medicine. What should I watch for while using this medicine? Visit your doctor or health care professional for regular checks ups. It may be some time before you see benefit from this medicine. Do not stop taking your medicine except on your doctor's advice. Your doctor or health care professional may order blood tests and other tests to see how you are doing. You should make sure you get enough calcium and vitamin D while you are taking this medicine, unless your doctor tells you not to. Discuss the foods you eat and the vitamins you take with your health care professional. Some people who take this medicine have severe bone, joint, and/or muscle pain. This medicine may also increase your risk for a broken thigh bone. Tell your doctor right away if you have pain in your upper leg or groin. Tell your doctor if you have any pain that does not go away or that gets worse. This medicine can make you more sensitive to the sun. If you get a rash while taking this medicine, sunlight may cause the rash to get worse. Keep out of the sun. If you cannot avoid being in the sun, wear protective clothing and use sunscreen. Do not use sun lamps or tanning beds/booths. What side effects may I notice from receiving this medicine? Side effects that you should report to your doctor or health care professional as soon as possible:  allergic reactions like skin rash, itching or hives, swelling of the face, lips, or tongue  black or tarry stools  bone, muscle or joint pain  changes in vision  chest pain  heartburn or stomach  pain  jaw pain, especially after dental work  pain or trouble when swallowing  redness, blistering, peeling or loosening of the skin, including inside the mouth Side effects that usually do not require medical attention (report to your  doctor or health care professional if they continue or are bothersome):  changes in taste  diarrhea or constipation  eye pain or itching  headache  nausea or vomiting  stomach gas or fullness This list may not describe all possible side effects. Call your doctor for medical advice about side effects. You may report side effects to FDA at 1-800-FDA-1088. Where should I keep my medicine? Keep out of the reach of children. Store at room temperature of 15 and 30 degrees C (59 and 86 degrees F). Throw away any unused medicine after the expiration date. NOTE: This sheet is a summary. It may not cover all possible information. If you have questions about this medicine, talk to your doctor, pharmacist, or health care provider.  2020 Elsevier/Gold Standard (2011-05-22 08:56:09)

## 2020-11-19 ENCOUNTER — Ambulatory Visit (INDEPENDENT_AMBULATORY_CARE_PROVIDER_SITE_OTHER): Payer: Medicare HMO | Admitting: Family Medicine

## 2020-11-19 ENCOUNTER — Encounter: Payer: Self-pay | Admitting: Family Medicine

## 2020-11-19 ENCOUNTER — Other Ambulatory Visit: Payer: Self-pay

## 2020-11-19 ENCOUNTER — Telehealth: Payer: Self-pay

## 2020-11-19 DIAGNOSIS — J329 Chronic sinusitis, unspecified: Secondary | ICD-10-CM | POA: Diagnosis not present

## 2020-11-19 MED ORDER — SULFAMETHOXAZOLE-TRIMETHOPRIM 800-160 MG PO TABS: 1.0000 | ORAL_TABLET | Freq: Two times a day (BID) | ORAL | 0 refills | Status: AC

## 2020-11-19 NOTE — Telephone Encounter (Signed)
Verified order was received thru Provider portal. LMVM to notify patient she will receive a kit in the mail with instructions.

## 2020-11-19 NOTE — Progress Notes (Signed)
Virtual Visit via Telephone  The purpose of this virtual visit is to provide medical care while limiting exposure to the novel coronavirus (COVID19) for both patient and office staff.  Consent was obtained for phone visit:  Yes.   Answered questions that patient had about telehealth interaction:  Yes.   I discussed the limitations, risks, security and privacy concerns of performing an evaluation and management service by telephone. I also discussed with the patient that there may be a patient responsible charge related to this service. The patient expressed understanding and agreed to proceed.  Patient is at home and is accessed via telephone Services are provided by Charlaine Dalton, FNP-C from [facility])  ---------------------------------------------------------------------- Chief Complaint  Patient presents with  . Sinus Problem    Nasal congestion, sinus pressure, sneezing,post and nasal drainage x 1 week     S: Reviewed CMA documentation. I have called patient and gathered additional HPI as follows:  Brandi [name] presents for virtual telemedicine visit via telephone for concerns of sinus infection x 7 days.  Reports she has been having nasal congestion, sinus pressure, with sneezing, post nasal drainage.  States has been having clear/white drainage out of her nose.  Has a cough when she has an increase in post nasal drainage.  Denies fevers, sore throat, change in taste/smell, SOB, CP, DOE, abdominal pain, n/v/d.  Reports she has sinus infections approximately 2x per year and they only respond to bactrim.    Patient is currently home Denies any high risk travel to areas of current concern for COVID19. Denies any known or suspected exposure to person with or possibly with COVID19.  Past Medical History:  Diagnosis Date  . Hyperlipidemia    Social History   Tobacco Use  . Smoking status: Former Smoker    Packs/day: 0.75    Years: 41.00    Pack years:  30.75    Types: E-cigarettes, Cigarettes    Quit date: 12/08/2015    Years since quitting: 4.9  . Smokeless tobacco: Former Clinical biochemist  . Vaping Use: Never used  Substance Use Topics  . Alcohol use: No  . Drug use: No    Current Outpatient Medications:  .  albuterol (VENTOLIN HFA) 108 (90 Base) MCG/ACT inhaler, Inhale 2 puffs into the lungs every 6 (six) hours as needed for wheezing or shortness of breath., Disp: 8 g, Rfl: 1 .  aspirin EC 81 MG tablet, Take 1 tablet (81 mg total) by mouth daily., Disp: 90 tablet, Rfl: 3 .  Calcium Carbonate-Vit D-Min (CALCIUM 1200) 1200-1000 MG-UNIT CHEW, Chew 1,200 mg by mouth daily., Disp: 30 each, Rfl: 11 .  Investigational vitamin D 600 UNITS capsule SWOG S0812, Take 1 capsule (600 Units total) by mouth daily. Take with food. (Patient taking differently: Take 600 Units by mouth daily. Take with food. Takes occasionally.), Disp: 30 capsule, Rfl: 11 .  alendronate (FOSAMAX) 70 MG tablet, Take 1 tablet (70 mg total) by mouth once a week. Take with a full glass of water on an empty stomach. (Patient not taking: Reported on 11/19/2020), Disp: 4 tablet, Rfl: 11 .  ezetimibe (ZETIA) 10 MG tablet, Take 1 tablet (10 mg total) by mouth daily., Disp: 90 tablet, Rfl: 3 .  rosuvastatin (CRESTOR) 20 MG tablet, Take 1 tablet (20 mg total) by mouth daily., Disp: 90 tablet, Rfl: 3 .  sulfamethoxazole-trimethoprim (BACTRIM DS) 800-160 MG tablet, Take 1 tablet by mouth 2 (two) times daily for 5 days., Disp:  10 tablet, Rfl: 0  Depression screen [facility] 2/9 04/08/2020  Decreased Interest 0  Down, Depressed, Hopeless 0  PHQ - 2 Score 0    No flowsheet data found.  -------------------------------------------------------------------------- O: No physical exam performed due to remote telephone encounter.  Physical Exam: Patient remotely monitored without video.  Verbal communication appropriate.  Cognition normal.  No results found for this or any previous visit (from  the past 2160 hour(s)).  -------------------------------------------------------------------------- A&P:  Problem List Items Addressed This Visit      Respiratory   Sinusitis - Primary    Sinus infection based on reported symptoms and chronic sinus infections reported.  Patient reports only responds to bactrim, discussed is not treatment choice for sinus infection but will send in prescription based on reported effectiveness in the past.  Patient in agreement and bactrim DS 1 tablet BID x 5 days sent to pharmacy on file.  Aware if not responsive to treatment, to RTC for re-evaluation.      Relevant Medications   sulfamethoxazole-trimethoprim (BACTRIM DS) 800-160 MG tablet      Meds ordered this encounter  Medications  . sulfamethoxazole-trimethoprim (BACTRIM DS) 800-160 MG tablet    Sig: Take 1 tablet by mouth 2 (two) times daily for 5 days.    Dispense:  10 tablet    Refill:  0    Follow-up: - Return if symptoms worsen or fail to improve  Patient verbalizes understanding with the above medical recommendations including the limitation of remote medical advice.  Specific follow-up and call-back criteria were given for patient to follow-up or seek medical care more urgently if needed.  - Time spent in direct consultation with patient on phone: 6 minutes  Charlaine Dalton, FNP-C [facility] 11/19/2020, 11:24 AM

## 2020-11-19 NOTE — Assessment & Plan Note (Signed)
Sinus infection based on reported symptoms and chronic sinus infections reported.  Patient reports only responds to bactrim, discussed is not treatment choice for sinus infection but will send in prescription based on reported effectiveness in the past.  Patient in agreement and bactrim DS 1 tablet BID x 5 days sent to pharmacy on file.  Aware if not responsive to treatment, to RTC for re-evaluation.

## 2020-11-19 NOTE — Telephone Encounter (Signed)
Patient states she is supposed to get a Cologard test. She is inquiring if she gets it here. Cb#[phone].

## 2020-11-21 ENCOUNTER — Telehealth: Payer: Medicare HMO | Admitting: Family Medicine

## 2020-12-04 ENCOUNTER — Encounter: Payer: Self-pay | Admitting: Family Medicine

## 2020-12-04 ENCOUNTER — Other Ambulatory Visit: Payer: Self-pay

## 2020-12-04 ENCOUNTER — Ambulatory Visit: Payer: Self-pay

## 2020-12-04 ENCOUNTER — Ambulatory Visit (INDEPENDENT_AMBULATORY_CARE_PROVIDER_SITE_OTHER): Payer: Medicare HMO | Admitting: Family Medicine

## 2020-12-04 ENCOUNTER — Other Ambulatory Visit: Payer: Self-pay | Admitting: Family Medicine

## 2020-12-04 VITALS — BP 111/66 | HR 81 | Temp 96.6°F | Resp 16 | Ht 64.0 in | Wt 122.0 lb

## 2020-12-04 DIAGNOSIS — E782 Mixed hyperlipidemia: Secondary | ICD-10-CM

## 2020-12-04 DIAGNOSIS — Z2821 Immunization not carried out because of patient refusal: Secondary | ICD-10-CM | POA: Diagnosis not present

## 2020-12-04 DIAGNOSIS — J432 Centrilobular emphysema: Secondary | ICD-10-CM

## 2020-12-04 DIAGNOSIS — R195 Other fecal abnormalities: Secondary | ICD-10-CM | POA: Diagnosis not present

## 2020-12-04 DIAGNOSIS — I700 Atherosclerosis of aorta: Secondary | ICD-10-CM

## 2020-12-04 DIAGNOSIS — R7309 Other abnormal glucose: Secondary | ICD-10-CM

## 2020-12-04 DIAGNOSIS — E559 Vitamin D deficiency, unspecified: Secondary | ICD-10-CM

## 2020-12-04 DIAGNOSIS — Z0000 Encounter for general adult medical examination without abnormal findings: Secondary | ICD-10-CM

## 2020-12-04 DIAGNOSIS — Z1159 Encounter for screening for other viral diseases: Secondary | ICD-10-CM

## 2020-12-04 NOTE — Progress Notes (Signed)
Subjective:    Patient ID: Brandi [name], female    DOB: 1956-07-09, 64 y.o.   MRN: [id number]  Brandi [name] is a 64 y.o. female presenting on 12/04/2020 for Melena (Onset week--dark stool)   HPI   Melena Reports symptoms onset about 1 week ago with  - She admits some fullness and gas in upper abdomen and she often takes Pepto Bismol to help relieve this. She takes it PRN as needed with meals to relieve symptoms - Now doing well but still has functional GI symptoms. But wanted to ask about testing. She has stopped pepto for past 2 days - Cologuard would have cost $600 to do it early now since not 3 years, her GYN ordered it again - Admits some weight loss as well, 1-2 lbs in past 6 months Denies any nausea vomiting, fever chills, abdominal pain Denies any bright red blood in stool   Health Maintenance:  Colon CA Screening: Never had colonoscopy. She had Cologuard in 2019. Negative good for 3 years. Currently with dark stools now for past 1 week thought to be due to pepto most likely but uncertain, no other concerning symptoms. No known family history of colon CA. Due for screening test in 2022 for repeat cologuard or sooner refer to GI for diagnostic colonoscopy    Depression screen [facility] 2/9 04/08/2020  Decreased Interest 0  Down, Depressed, Hopeless 0  PHQ - 2 Score 0    Social History   Tobacco Use  . Smoking status: Former Smoker    Packs/day: 0.75    Years: 41.00    Pack years: 30.75    Types: E-cigarettes, Cigarettes    Quit date: 12/08/2015    Years since quitting: 4.9  . Smokeless tobacco: Former Clinical biochemist  . Vaping Use: Never used  Substance Use Topics  . Alcohol use: No  . Drug use: No    Review of Systems Per HPI unless specifically indicated above     Objective:    BP 111/66   Pulse 81   Temp (!) 96.6 F (35.9 C)   Resp 16   Ht 5\' 4"  (1.626 m)   Wt 122 lb (55.3 kg)   SpO2 99%   BMI 20.94 kg/m   Wt Readings from Last 3 Encounters:   12/04/20 122 lb (55.3 kg)  11/04/20 124 lb 12.8 oz (56.6 kg)  04/08/20 123 lb (55.8 kg)    Physical Exam Vitals and nursing note reviewed.  Constitutional:      General: She is not in acute distress.    Appearance: She is well-developed and well-nourished. She is not diaphoretic.     Comments: Well-appearing, comfortable, cooperative  HENT:     Head: Normocephalic and atraumatic.     Mouth/Throat:     Mouth: Oropharynx is clear and moist.  Eyes:     General:        Right eye: No discharge.        Left eye: No discharge.     Conjunctiva/sclera: Conjunctivae normal.  Neck:     Thyroid: No thyromegaly.  Cardiovascular:     Rate and Rhythm: Normal rate.     Pulses: Intact distal pulses.  Pulmonary:     Effort: Pulmonary effort is normal.  Abdominal:     General: Abdomen is flat. Bowel sounds are normal. There is no distension.     Palpations: Abdomen is soft. There is no mass.     Tenderness: There is no abdominal tenderness.  There is no guarding or rebound.  Musculoskeletal:        General: No edema. Normal range of motion.     Cervical back: Normal range of motion and neck supple.  Lymphadenopathy:     Cervical: No cervical adenopathy.  Skin:    General: Skin is warm and dry.     Findings: No erythema or rash.  Neurological:     Mental Status: She is alert and oriented to person, place, and time.  Psychiatric:        Mood and Affect: Mood and affect normal.        Behavior: Behavior normal.     Comments: Well groomed, good eye contact, normal speech and thoughts      Results for orders placed or performed in visit on 04/01/20  Lipid panel  Result Value Ref Range   Cholesterol, Total 145 100 - 199 mg/dL   Triglycerides 51 0 - 149 mg/dL   HDL 55 >75 mg/dL   VLDL Cholesterol Cal 11 5 - 40 mg/dL   LDL Chol Calc (NIH) 79 0 - 99 mg/dL   Chol/HDL Ratio 2.6 0.0 - 4.4 ratio  Hepatic function panel  Result Value Ref Range   Total Protein 6.3 6.0 - 8.5 g/dL   Albumin  4.3 3.8 - 4.8 g/dL   Bilirubin Total 0.2 0.0 - 1.2 mg/dL   Bilirubin, Direct 6.43 0.00 - 0.40 mg/dL   Alkaline Phosphatase 68 39 - 117 IU/L   AST 18 0 - 40 IU/L   ALT 9 0 - 32 IU/L      Assessment & Plan:   Problem List Items Addressed This Visit    COVID-19 vaccination declined    Other Visit Diagnoses    Dark stools    -  Primary      Dark stools/melena Functional GI symptoms  Based on history and benign exam, it sounds like most likely cause is darkening of stool from OTC pepto medication. She seems to have limited other concerning symptoms at this time. Onset of symptom for 1 week only. Now stopped pepto, was taking for functional GI symptoms  No history of prior colonoscopy is concerning. She has had negative Cologuard 2019. Too soon to repeat Cologuard, cost was $600 she declined repeat test.  I advised her that she should STOP Pepto at this time. Monitor bowel habits and dark stools for next few days to 1-2 weeks. If resolved problem, then she can use pepto with caution going forward, or try other symptomatic medications for functional GI symptoms bloating etc.  Regardless, of the outcome of the dark stools, I did offer GI referral now and she declined today will let me know if prefers to go this route, she should likely benefit from a Colonoscopy regardless, she is hesitant due to COVID restrictions now. Ultimately I think Colonoscopy is preferred test for her next rather than repeat Cologuard but we can reconsider this at upcoming Annual in 03/2021   No orders of the defined types were placed in this encounter.     Follow up plan: Return in about 4 months (around 04/04/2021) for 4 month fasting lab only then 1 week later Annual Physical.  Future labs ordered for 03/2021  [name], DO [facility] 12/04/2020, 1:26 PM

## 2020-12-04 NOTE — Patient Instructions (Addendum)
Thank you for coming to the office today.  Stop Pepto this can cause dark stool  If you get abdominal fullness and bloating, you can start Prilosec OTC 20mg  daily BEFORE meal once daily.  Can try Gas-X as needed.  Or let me know in future for rx Dicyclomine (Bentyl) for bloating and cramping - as needed.  If the dark stools stop. Then you would likely be okay to wait until year 3 is up sometime in 2022, for repeat Cologuard, OR we can refer you to GI for a consultation to discuss further (especially if the problem does not resolve within 1-2 weeks)  [facility]) [address] - [address] [place], [place] Phone: [phone]   DUE for FASTING BLOOD WORK (no food or drink after midnight before the lab appointment, only water or coffee without cream/sugar on the morning of)  SCHEDULE "Lab Only" visit in the morning at the clinic for lab draw in 4 MONTHS   - Make sure Lab Only appointment is at about 1 week before your next appointment, so that results will be available  For Lab Results, once available within 2-3 days of blood draw, you can can log in to MyChart online to view your results and a brief explanation. Also, we can discuss results at next follow-up visit.   Please schedule a Follow-up Appointment to: Return in about 4 months (around 04/04/2021) for 4 month fasting lab only then 1 week later Annual Physical.  If you have any other questions or concerns, please feel free to call the office or send a message through MyChart. You may also schedule an earlier appointment if necessary.  Additionally, you may be receiving a survey about your experience at our office within a few days to 1 week by e-mail or mail. We value your feedback.  04/06/2021, DO [facility], [facility]

## 2020-12-04 NOTE — Telephone Encounter (Signed)
  Patient called stating that she has noticed her stools are black.  She states that this started one week ago.  She states that they are sticky consistency.  She has been having frequent indigestion and taking pepto bismol . She states that she has a cologuard that was positive in 2019 and has never followed up because of the pandemic. For this reason appointment scheduled today. Care advice read to patient.  She verbalized understanding Reason for Disposition . [1] Abnormal color is unexplained AND [2] persists > 24 hours    cologuard was positive 2019  Answer Assessment - Initial Assessment Questions 1. COLOR: "What color is it?" "Is that color in part or all of the stool?"     black 2. ONSET: "When was the unusual color first noted?"     1 week ago 3. CAUSE: "Have you eaten any food or taken any medicine of this color?" (See listing in BACKGROUND)     pepto bith month daily ASA 4. OTHER SYMPTOMS: "Do you have any other symptoms?" (e.g., diarrhea, jaundice, abdominal pain, fever).    None loss of weight  Protocols used: STOOLS - UNUSUAL COLOR-A-AH

## 2020-12-10 ENCOUNTER — Telehealth: Payer: Self-pay | Admitting: Family Medicine

## 2020-12-10 DIAGNOSIS — J0100 Acute maxillary sinusitis, unspecified: Secondary | ICD-10-CM

## 2020-12-10 MED ORDER — SULFAMETHOXAZOLE-TRIMETHOPRIM 800-160 MG PO TABS: 1.0000 | ORAL_TABLET | Freq: Two times a day (BID) | ORAL | 0 refills | Status: DC

## 2020-12-10 NOTE — Telephone Encounter (Signed)
Returned call to patient who states that she is still experiencing headache and pressure around her nose. Patient states symptoms did improve initially but they have returned and worsened. Patient states she has mostly clear sputum. Patient states during visit on 12/04/20 with Dr. Kirtland Bouchard she was told to return call if symptoms did not improve. Patient requesting a refill of Bactrim DS.

## 2020-12-10 NOTE — Telephone Encounter (Signed)
Copied from CRM [id number]. Topic: Quick Communication - Rx Refill/Question >> Dec 10, 2020 11:38 AM Jaquita Rector A wrote: Medication: sulfamethoxazole-trimethoprim (BACTRIM DS) 800-160 MG tablet  Per patient Dr Kirtland Bouchard say he will refill need call if done please [phone]  Has the patient contacted their pharmacy? Yes.   (Agent: If no, request that the patient contact the pharmacy for the refill.) (Agent: If yes, when and what did the pharmacy advise?)  Preferred Pharmacy (with phone number or street name): [facility] - [place], [place] - [address]  Phone:  [phone] Fax:  [phone]     Agent: Please be advised that RX refills may take up to 3 business days. We ask that you follow-up with your pharmacy.

## 2020-12-10 NOTE — Telephone Encounter (Signed)
Called patient, she requests sulfa drug, prefers not to switch antibiotic, has always worked for her, request 7 days longer course instead of 5 day. I sent to [facility]  [name], DO [facility] 12/10/2020, 1:46 PM

## 2020-12-13 ENCOUNTER — Encounter: Payer: Self-pay | Admitting: Family Medicine

## 2020-12-13 ENCOUNTER — Other Ambulatory Visit: Payer: Self-pay

## 2020-12-13 ENCOUNTER — Telehealth (INDEPENDENT_AMBULATORY_CARE_PROVIDER_SITE_OTHER): Payer: Medicare HMO | Admitting: Family Medicine

## 2020-12-13 DIAGNOSIS — Z20822 Contact with and (suspected) exposure to covid-19: Secondary | ICD-10-CM

## 2020-12-13 DIAGNOSIS — R112 Nausea with vomiting, unspecified: Secondary | ICD-10-CM

## 2020-12-13 DIAGNOSIS — E860 Dehydration: Secondary | ICD-10-CM

## 2020-12-13 NOTE — Progress Notes (Signed)
Virtual Visit via Telephone The purpose of this virtual visit is to provide medical care while limiting exposure to the novel coronavirus (COVID19) for both patient and office staff.  Consent was obtained for phone visit:  Yes.   Answered questions that patient had about telehealth interaction:  Yes.   I discussed the limitations, risks, security and privacy concerns of performing an evaluation and management service by telephone. I also discussed with the patient that there may be a patient responsible charge related to this service. The patient expressed understanding and agreed to proceed.  Patient Location: Home Provider Location: [facility] (Office)  Participants in virtual visit: - Patient: Brandi [name] - CMA: Elvina Mattes, CMA - Provider: Dr Althea Charon  ---------------------------------------------------------------------- Chief Complaint  Patient presents with  . Sinus Problem  . Abdominal Pain    Nausea as per patient side effect from Bactrim     S: Reviewed CMA documentation. I have called patient and gathered additional HPI as follows:  Suspected COVID / Viral Syndrome / Dehydration / nausea vomiting Recent history 12/29 treated for dark stools. 12/10/20 she contacted [place] back for sinus symptoms and headache, usually resolved w/ bactrim in past, agreed to trial this based on her past experience. Now today called and scheduled acute sick visit virtually due to variety of symptoms sinus congestion, cough, fatigue, drained, exhausted, abdominal pain, nausea vomiting - She tried to [facility] yesterday but was turned away. - She was still taking Bactrim asked if she should continue this. Reduced PO intake Unvaccinated against COVID  Denies any known or suspected exposure to person with or possibly with COVID19.  Denies any fevers, chills, sweats, body ache, shortness of breath, diarrhea  Past Medical History:  Diagnosis Date  . Hyperlipidemia     Social History   Tobacco Use  . Smoking status: Former Smoker    Packs/day: 0.75    Years: 41.00    Pack years: 30.75    Types: E-cigarettes, Cigarettes    Quit date: 12/08/2015    Years since quitting: 5.0  . Smokeless tobacco: Former Clinical biochemist  . Vaping Use: Never used  Substance Use Topics  . Alcohol use: No  . Drug use: No    Current Outpatient Medications:  .  albuterol (VENTOLIN HFA) 108 (90 Base) MCG/ACT inhaler, Inhale 2 puffs into the lungs every 6 (six) hours as needed for wheezing or shortness of breath., Disp: 8 g, Rfl: 1 .  alendronate (FOSAMAX) 70 MG tablet, Take 1 tablet (70 mg total) by mouth once a week. Take with a full glass of water on an empty stomach., Disp: 4 tablet, Rfl: 11 .  aspirin EC 81 MG tablet, Take 1 tablet (81 mg total) by mouth daily., Disp: 90 tablet, Rfl: 3 .  Calcium Carbonate-Vit D-Min (CALCIUM 1200) 1200-1000 MG-UNIT CHEW, Chew 1,200 mg by mouth daily., Disp: 30 each, Rfl: 11 .  Investigational vitamin D 600 UNITS capsule SWOG S0812, Take 1 capsule (600 Units total) by mouth daily. Take with food. (Patient taking differently: Take 600 Units by mouth daily. Take with food. Takes occasionally.), Disp: 30 capsule, Rfl: 11 .  rosuvastatin (CRESTOR) 20 MG tablet, Take 1 tablet (20 mg total) by mouth daily., Disp: 90 tablet, Rfl: 3 .  sulfamethoxazole-trimethoprim (BACTRIM DS) 800-160 MG tablet, Take 1 tablet by mouth 2 (two) times daily for 7 days., Disp: 14 tablet, Rfl: 0 .  ezetimibe (ZETIA) 10 MG tablet, Take 1 tablet (10 mg total)  by mouth daily., Disp: 90 tablet, Rfl: 3  Depression screen PHQ 2/9 04/08/2020  Decreased Interest 0  Down, Depressed, Hopeless 0  PHQ - 2 Score 0    No flowsheet data found.  -------------------------------------------------------------------------- O: No physical exam performed due to remote telephone encounter.  Lab results reviewed.  No results found for this or any previous visit (from the past 2160  hour(s)).  -------------------------------------------------------------------------- A&P:  Problem List Items Addressed This Visit   None   Visit Diagnoses    Suspected COVID-19 virus infection    -  Primary   Dehydration       Nausea and vomiting, intractability of vomiting not specified, unspecified vomiting type         Clinically with constellation of viral symptoms at risk of COVID, unvaccinated Recent course complicated by dark stools thought to be due to pepto medication Poor PO, nausea vomiting Was on recent short course bactrim  Advise to HOLD Bactrim if causing GI side effect Seek immediate care Urgent Care (Recommended [facility]) as discussed, her symptoms likely could indicate dehydration, cannot be sure on the recent dark stools - may need labs promptly chemistry + CBC, also will need COVID testing  Return criteria advised to patient. Given afternoon here unable to complete all of the testing she would need in prompt manner and acute symptoms, she would be best served at an escalated level of care today.  No orders of the defined types were placed in this encounter.   Follow-up: PRN  Patient verbalizes understanding with the above medical recommendations including the limitation of remote medical advice.  Specific follow-up and call-back criteria were given for patient to follow-up or seek medical care more urgently if needed.   - Time spent in direct consultation with patient on phone: 15 minutes   [name], DO [facility] 12/13/2020, 1:41 PM

## 2020-12-16 ENCOUNTER — Inpatient Hospital Stay
Admission: EM | Admit: 2020-12-16 | Discharge: 2020-12-23 | DRG: 177 | Disposition: A | Discharge status: Home Health | Payer: Medicare HMO | Attending: Internal Medicine | Admitting: Internal Medicine

## 2020-12-16 ENCOUNTER — Emergency Department: Payer: Medicare HMO

## 2020-12-16 ENCOUNTER — Encounter: Payer: Self-pay | Admitting: Emergency Medicine

## 2020-12-16 ENCOUNTER — Other Ambulatory Visit: Payer: Self-pay

## 2020-12-16 DIAGNOSIS — U071 COVID-19: Principal | ICD-10-CM

## 2020-12-16 DIAGNOSIS — K921 Melena: Secondary | ICD-10-CM | POA: Diagnosis not present

## 2020-12-16 DIAGNOSIS — R0902 Hypoxemia: Secondary | ICD-10-CM

## 2020-12-16 DIAGNOSIS — D72818 Other decreased white blood cell count: Secondary | ICD-10-CM | POA: Diagnosis present

## 2020-12-16 DIAGNOSIS — E785 Hyperlipidemia, unspecified: Secondary | ICD-10-CM | POA: Diagnosis not present

## 2020-12-16 DIAGNOSIS — E871 Hypo-osmolality and hyponatremia: Secondary | ICD-10-CM | POA: Diagnosis present

## 2020-12-16 DIAGNOSIS — Z87891 Personal history of nicotine dependence: Secondary | ICD-10-CM

## 2020-12-16 DIAGNOSIS — R7989 Other specified abnormal findings of blood chemistry: Secondary | ICD-10-CM | POA: Diagnosis present

## 2020-12-16 DIAGNOSIS — J9601 Acute respiratory failure with hypoxia: Secondary | ICD-10-CM | POA: Diagnosis present

## 2020-12-16 DIAGNOSIS — J1282 Pneumonia due to coronavirus disease 2019: Secondary | ICD-10-CM | POA: Diagnosis not present

## 2020-12-16 DIAGNOSIS — E860 Dehydration: Secondary | ICD-10-CM | POA: Diagnosis not present

## 2020-12-16 DIAGNOSIS — K30 Functional dyspepsia: Secondary | ICD-10-CM | POA: Diagnosis present

## 2020-12-16 DIAGNOSIS — F419 Anxiety disorder, unspecified: Secondary | ICD-10-CM | POA: Diagnosis present

## 2020-12-16 DIAGNOSIS — R531 Weakness: Secondary | ICD-10-CM

## 2020-12-16 DIAGNOSIS — D6959 Other secondary thrombocytopenia: Secondary | ICD-10-CM | POA: Diagnosis present

## 2020-12-16 DIAGNOSIS — Z7982 Long term (current) use of aspirin: Secondary | ICD-10-CM

## 2020-12-16 DIAGNOSIS — Z79899 Other long term (current) drug therapy: Secondary | ICD-10-CM | POA: Diagnosis not present

## 2020-12-16 DIAGNOSIS — B370 Candidal stomatitis: Secondary | ICD-10-CM | POA: Diagnosis present

## 2020-12-16 DIAGNOSIS — R0602 Shortness of breath: Secondary | ICD-10-CM | POA: Diagnosis not present

## 2020-12-16 DIAGNOSIS — R9431 Abnormal electrocardiogram [ECG] [EKG]: Secondary | ICD-10-CM | POA: Diagnosis not present

## 2020-12-16 DIAGNOSIS — J189 Pneumonia, unspecified organism: Secondary | ICD-10-CM | POA: Diagnosis not present

## 2020-12-16 LAB — CBG MONITORING, ED: Glucose-Capillary: 105 mg/dL — ABNORMAL HIGH (ref 70–99)

## 2020-12-16 LAB — TYPE AND SCREEN
ABO/RH(D): A POS
Antibody Screen: NEGATIVE

## 2020-12-16 LAB — TROPONIN I (HIGH SENSITIVITY)
Troponin I (High Sensitivity): 10 ng/L (ref <18)
Troponin I (High Sensitivity): 9 ng/L (ref <18)

## 2020-12-16 LAB — URINALYSIS, COMPLETE (UACMP) WITH MICROSCOPIC
Bilirubin Urine: NEGATIVE
Glucose, UA: NEGATIVE mg/dL
Hgb urine dipstick: NEGATIVE
Ketones, ur: 20 mg/dL — AB
Leukocytes,Ua: NEGATIVE
Nitrite: NEGATIVE
Protein, ur: 100 mg/dL — AB
Specific Gravity, Urine: 1.026 (ref 1.005–1.030)
pH: 5 (ref 5.0–8.0)

## 2020-12-16 LAB — COMPREHENSIVE METABOLIC PANEL WITH GFR
ALT: 25 U/L (ref 0–44)
AST: 52 U/L — ABNORMAL HIGH (ref 15–41)
Albumin: 4 g/dL (ref 3.5–5.0)
Alkaline Phosphatase: 51 U/L (ref 38–126)
Anion gap: 12 (ref 5–15)
BUN: 15 mg/dL (ref 8–23)
CO2: 28 mmol/L (ref 22–32)
Calcium: 9 mg/dL (ref 8.9–10.3)
Chloride: 93 mmol/L — ABNORMAL LOW (ref 98–111)
Creatinine, Ser: 0.52 mg/dL (ref 0.44–1.00)
GFR, Estimated: >60 mL/min
Glucose, Bld: 114 mg/dL — ABNORMAL HIGH (ref 70–99)
Potassium: 3.8 mmol/L (ref 3.5–5.1)
Sodium: 133 mmol/L — ABNORMAL LOW (ref 135–145)
Total Bilirubin: 0.8 mg/dL (ref 0.3–1.2)
Total Protein: 7.3 g/dL (ref 6.5–8.1)

## 2020-12-16 LAB — CBC
HCT: 43.5 % (ref 36.0–46.0)
Hemoglobin: 15.2 g/dL — ABNORMAL HIGH (ref 12.0–15.0)
MCH: 32.2 pg (ref 26.0–34.0)
MCHC: 34.9 g/dL (ref 30.0–36.0)
MCV: 92.2 fL (ref 80.0–100.0)
Platelets: 144 K/uL — ABNORMAL LOW (ref 150–400)
RBC: 4.72 MIL/uL (ref 3.87–5.11)
RDW: 12.3 % (ref 11.5–15.5)
WBC: 3.2 K/uL — ABNORMAL LOW (ref 4.0–10.5)
nRBC: 0 % (ref 0.0–0.2)

## 2020-12-16 LAB — POC SARS CORONAVIRUS 2 AG -  ED: SARS Coronavirus 2 Ag: POSITIVE — AB

## 2020-12-16 LAB — LIPASE, BLOOD: Lipase: 37 U/L (ref 11–51)

## 2020-12-16 MED ORDER — SODIUM CHLORIDE 0.9 % IV SOLN
100.0000 mg | Freq: Every day | INTRAVENOUS | Status: AC
  Administered 2020-12-17 – 2020-12-20 (×4): 100 mg via INTRAVENOUS
  Filled 2020-12-16 (×4): qty 20

## 2020-12-16 MED ORDER — ROSUVASTATIN CALCIUM 20 MG PO TABS
20.0000 mg | ORAL_TABLET | Freq: Every day | ORAL | Status: DC
  Administered 2020-12-17 – 2020-12-23 (×7): 20 mg via ORAL
  Filled 2020-12-16 (×9): qty 1

## 2020-12-16 MED ORDER — LACTATED RINGERS IV BOLUS
1000.0000 mL | Freq: Once | INTRAVENOUS | Status: AC
  Administered 2020-12-16: 1000 mL via INTRAVENOUS

## 2020-12-16 MED ORDER — SODIUM CHLORIDE 0.9 % IV SOLN
200.0000 mg | Freq: Once | INTRAVENOUS | Status: AC
  Administered 2020-12-16: 200 mg via INTRAVENOUS
  Filled 2020-12-16: qty 40
  Filled 2020-12-16: qty 200

## 2020-12-16 MED ORDER — ALBUTEROL SULFATE HFA 108 (90 BASE) MCG/ACT IN AERS
2.0000 | INHALATION_SPRAY | Freq: Four times a day (QID) | RESPIRATORY_TRACT | Status: DC | PRN
  Administered 2020-12-17 – 2020-12-18 (×2): 2 via RESPIRATORY_TRACT
  Filled 2020-12-16 (×3): qty 6.7

## 2020-12-16 MED ORDER — SODIUM CHLORIDE 0.9% FLUSH: 3.0000 mL | INTRAVENOUS | Status: DC | PRN

## 2020-12-16 MED ORDER — SODIUM CHLORIDE 0.9 % IV SOLN
250.0000 mL | INTRAVENOUS | Status: DC | PRN
  Administered 2020-12-17 – 2020-12-20 (×2): 250 mL via INTRAVENOUS

## 2020-12-16 MED ORDER — GUAIFENESIN-DM 100-10 MG/5ML PO SYRP
5.0000 mL | ORAL_SOLUTION | ORAL | Status: DC | PRN
  Administered 2020-12-17 – 2020-12-22 (×3): 5 mL via ORAL
  Filled 2020-12-16 (×4): qty 5

## 2020-12-16 MED ORDER — DEXAMETHASONE SODIUM PHOSPHATE 10 MG/ML IJ SOLN
6.0000 mg | Freq: Once | INTRAMUSCULAR | Status: AC
  Administered 2020-12-16: 6 mg via INTRAVENOUS
  Filled 2020-12-16: qty 1

## 2020-12-16 MED ORDER — ACETAMINOPHEN 325 MG PO TABS: 650.0000 mg | ORAL_TABLET | Freq: Four times a day (QID) | ORAL | Status: DC | PRN

## 2020-12-16 MED ORDER — DEXAMETHASONE 4 MG PO TABS
6.0000 mg | ORAL_TABLET | ORAL | Status: DC
Start: 2020-12-17 — End: 2020-12-17
  Administered 2020-12-17: 6 mg via ORAL
  Filled 2020-12-16 (×2): qty 2

## 2020-12-16 MED ORDER — EZETIMIBE 10 MG PO TABS
10.0000 mg | ORAL_TABLET | Freq: Every day | ORAL | Status: DC
Start: 2020-12-16 — End: 2020-12-23
  Administered 2020-12-16 – 2020-12-23 (×8): 10 mg via ORAL
  Filled 2020-12-16 (×10): qty 1

## 2020-12-16 MED ORDER — ASPIRIN EC 81 MG PO TBEC
81.0000 mg | DELAYED_RELEASE_TABLET | Freq: Every day | ORAL | Status: DC
Start: 2020-12-16 — End: 2020-12-23
  Administered 2020-12-16 – 2020-12-23 (×8): 81 mg via ORAL
  Filled 2020-12-16 (×8): qty 1

## 2020-12-16 MED ORDER — ENOXAPARIN SODIUM 40 MG/0.4ML ~~LOC~~ SOLN
40.0000 mg | SUBCUTANEOUS | Status: DC
  Administered 2020-12-16 – 2020-12-22 (×7): 40 mg via SUBCUTANEOUS
  Filled 2020-12-16 (×7): qty 0.4

## 2020-12-16 MED ORDER — SODIUM CHLORIDE 0.9% FLUSH
3.0000 mL | Freq: Two times a day (BID) | INTRAVENOUS | Status: DC
  Administered 2020-12-16 – 2020-12-23 (×15): 3 mL via INTRAVENOUS

## 2020-12-16 NOTE — Consult Note (Signed)
Remdesivir - Pharmacy Brief Note   O:  ALT: 25 CXR: "Mild multifocal pneumonia" SpO2: Hypoxic requiring supplemental oxygen   A/P:  1/10 SARS-CoV-2 Ag (+)  Remdesivir 200 mg IVPB once followed by 100 mg IVPB daily x 4 days.   Brandi [name] 12/16/2020 2:50 PM

## 2020-12-16 NOTE — ED Triage Notes (Signed)
Pt states symptoms x 1 week. Pt states decreased PO intake x 1 week. Pt c/o increasing weakness and fatigue, black stools, and nausea. Pt also c/o abdominal pain, also c/o feeling like she is going to pass out with standing.

## 2020-12-16 NOTE — ED Provider Notes (Signed)
[facility] Emergency Department Provider Note ____________________________________________   Event Date/Time   First MD Initiated Contact with Patient 12/16/20 [phone]     (approximate)  I have reviewed the triage vital signs and the nursing notes.  HISTORY  Chief Complaint Weakness, Melena, and Decreased appetite   HPI Brandi [name] is a 65 y.o. femalewho presents to the ED for evaluation of generalized weakness, fatigue and abdominal pain.  Chart review indicates history of HLD.  Not vaccinated for COVID-19.  Patient, and her husband separately checking in, resent to the ED with 1 week of generalized weakness, poor p.o. intake, fatigue, presyncopal lightheaded dizziness, nausea and melena.  Patient denies abdominal pain, chest pain, syncope or emesis.  She reports loose stools that appear dark in color, worsening with Tums that she is taking due to her nausea.  Patient is not on any blood thinners.  Denies productive cough.    Past Medical History:  Diagnosis Date  . Hyperlipidemia     Patient Active Problem List   Diagnosis Date Noted  . COVID-19 vaccination declined 12/04/2020  . Sinusitis 11/19/2020  . Centrilobular emphysema (HCC) 04/08/2020  . Nodule of upper lobe of left lung 04/08/2020  . Aortic atherosclerosis (HCC) 05/21/2019  . Coronary artery calcification seen on CT scan 05/21/2019  . Former smoker 05/21/2019  . Hyperlipidemia 05/21/2019    Past Surgical History:  Procedure Laterality Date  . APPENDECTOMY    . CHOLECYSTECTOMY    . SHOULDER ARTHROSCOPY Left 07/02/2015   Procedure: ,left shoulder arthroscopy, decompression and debridement;  Surgeon: Christena Flake, MD;  Location: [facility] ORS;  Service: Orthopedics;  Laterality: Left;  . SHOULDER CLOSED REDUCTION Left 04/09/2015   Procedure: CLOSED MANIPULATION SHOULDER;  Surgeon: Kennedy Bucker, MD;  Location: [facility] ORS;  Service: Orthopedics;  Laterality: Left;    Prior to Admission  medications   Medication Sig Start Date End Date Taking? Authorizing Provider  albuterol (VENTOLIN HFA) 108 (90 Base) MCG/ACT inhaler Inhale 2 puffs into the lungs every 6 (six) hours as needed for wheezing or shortness of breath. 04/01/20   Gollan, Tollie Pizza, MD  alendronate (FOSAMAX) 70 MG tablet Take 1 tablet (70 mg total) by mouth once a week. Take with a full glass of water on an empty stomach. 11/04/20   [name], MD  aspirin EC 81 MG tablet Take 1 tablet (81 mg total) by mouth daily. 05/22/19   [name], MD  Calcium Carbonate-Vit D-Min (CALCIUM 1200) 1200-1000 MG-UNIT CHEW Chew 1,200 mg by mouth daily. 01/31/19   [name], Jaquelyn Bitter, MD  ezetimibe (ZETIA) 10 MG tablet Take 1 tablet (10 mg total) by mouth daily. 04/11/20 07/10/20  [name], MD  Investigational vitamin D 600 UNITS capsule SWOG [phone] Take 1 capsule (600 Units total) by mouth daily. Take with food. Patient taking differently: Take 600 Units by mouth daily. Take with food. Takes occasionally. 01/31/19   [name], Jaquelyn Bitter, MD  rosuvastatin (CRESTOR) 20 MG tablet Take 1 tablet (20 mg total) by mouth daily. 06/06/20 09/04/20  [name], MD  sulfamethoxazole-trimethoprim (BACTRIM DS) 800-160 MG tablet Take 1 tablet by mouth 2 (two) times daily for 7 days. 12/10/20 12/17/20  [name], DO    Allergies Patient has no known allergies.  Family History  Problem Relation Age of Onset  . Breast cancer Mother 22  . Alzheimer's disease Mother     Social History Social History   Tobacco Use  . Smoking  status: Former Smoker    Packs/day: 0.75    Years: 41.00    Pack years: 30.75    Types: E-cigarettes, Cigarettes    Quit date: 12/08/2015    Years since quitting: 5.0  . Smokeless tobacco: Former Clinical biochemist  . Vaping Use: Never used  Substance Use Topics  . Alcohol use: No  . Drug use: No    Review of Systems  Constitutional: Positive for subjective fevers and  chills Eyes: No visual changes. ENT: No sore throat. Cardiovascular: Denies chest pain. Respiratory: Positive for nonproductive cough and shortness of breath Gastrointestinal: No abdominal pain.   no vomiting.  No diarrhea.  No constipation.  Positive for nausea and melena. Genitourinary: Negative for dysuria. Musculoskeletal: Negative for back pain. Skin: Negative for rash. Neurological: Negative for headaches, focal weakness or numbness.  ____________________________________________   PHYSICAL EXAM:  VITAL SIGNS: Vitals:   12/16/20 0746 12/16/20 0952  BP:  120/84  Pulse:  96  Resp:  (!) 24  Temp:  99.7 F (37.6 C)  SpO2: 93% 94%     Constitutional: Alert and oriented.  Uncomfortable-appearing, conversational in full sentences.  Requiring nasal cannula. Eyes: Conjunctivae are normal. PERRL. EOMI. Head: Atraumatic. Nose: No congestion/rhinnorhea. Mouth/Throat: Mucous membranes are dry.  Oropharynx non-erythematous. Neck: No stridor. No cervical spine tenderness to palpation. Cardiovascular: Normal rate, regular rhythm. Grossly normal heart sounds.  Good peripheral circulation. Respiratory: Slight tachypnea to the mid 20s, no further evidence of distress.  Clear lungs throughout. Gastrointestinal: Soft , nondistended, nontender to palpation. No CVA tenderness. Musculoskeletal: No lower extremity tenderness nor edema.  No joint effusions. No signs of acute trauma. Neurologic:  Normal speech and language. No gross focal neurologic deficits are appreciated. No gait instability noted. Skin:  Skin is warm, dry and intact. No rash noted. Psychiatric: Mood and affect are normal. Speech and behavior are normal.  ____________________________________________   LABS (all labs ordered are listed, but only abnormal results are displayed)  Labs Reviewed  CBC - Abnormal; Notable for the following components:      Result Value   WBC 3.2 (*)    Hemoglobin 15.2 (*)    Platelets 144  (*)    All other components within normal limits  COMPREHENSIVE METABOLIC PANEL - Abnormal; Notable for the following components:   Sodium 133 (*)    Chloride 93 (*)    Glucose, Bld 114 (*)    AST 52 (*)    All other components within normal limits  CBG MONITORING, ED - Abnormal; Notable for the following components:   Glucose-Capillary 105 (*)    All other components within normal limits  POC SARS CORONAVIRUS 2 AG -  ED - Abnormal; Notable for the following components:   SARS Coronavirus 2 Ag POSITIVE (*)    All other components within normal limits  LIPASE, BLOOD  URINALYSIS, COMPLETE (UACMP) WITH MICROSCOPIC  CBG MONITORING, ED  TYPE AND SCREEN  TROPONIN I (HIGH SENSITIVITY)  TROPONIN I (HIGH SENSITIVITY)   ____________________________________________  12 Lead EKG  Sinus rhythm, rate of 103 bpm, normal axis and intervals.  No evidence of acute ischemia.  Sinus tachycardia. ____________________________________________  RADIOLOGY  ED MD interpretation: 2 view CXR reviewed by me with mild patchy multifocal infiltrates consistent with COVID-19 without discrete lobar filtration.  Official radiology report(s): DG Chest 2 View  Result Date: 12/16/2020 CLINICAL DATA:  Shortness of breath, hypoxia EXAM: CHEST - 2 VIEW COMPARISON:  CT chest dated 04/01/2020 FINDINGS: Faint patchy  opacity in the left upper lobe/lingula and bilateral lower lobes, suggesting mild multifocal pneumonia. No pleural effusion or pneumothorax. The heart is normal in size. Visualized osseous structures are within normal limits. IMPRESSION: Mild multifocal pneumonia. Electronically Signed   By: Charline Bills M.D.   On: 12/16/2020 08:12    ____________________________________________   PROCEDURES and INTERVENTIONS  Procedure(s) performed (including Critical Care):  .1-3 Lead EKG Interpretation Performed by: Delton Prairie, MD Authorized by: Delton Prairie, MD     Interpretation: abnormal     ECG rate:   102   ECG rate assessment: tachycardic     Rhythm: sinus tachycardia     Ectopy: none     Conduction: normal   .Critical Care Performed by: Delton Prairie, MD Authorized by: Delton Prairie, MD   Critical care provider statement:    Critical care time (minutes):  30   Critical care was necessary to treat or prevent imminent or life-threatening deterioration of the following conditions:  Respiratory failure   Critical care was time spent personally by me on the following activities:  Discussions with consultants, evaluation of patient's response to treatment, examination of patient, ordering and performing treatments and interventions, ordering and review of laboratory studies, ordering and review of radiographic studies, pulse oximetry, re-evaluation of patient's condition, obtaining history from patient or surrogate and review of old charts    Medications  dexamethasone (DECADRON) injection 6 mg (has no administration in time range)  lactated ringers bolus 1,000 mL (has no administration in time range)    ____________________________________________   MDM / ED COURSE   Unvaccinated 65 year old woman presents to the ED with 1 week of symptoms consistent with COVID-19, without evidence of additional acute pathology, and requiring medical admission due to hypoxia and severity of her disease.  Hypoxic to the mid 80s necessitating 2-3 L nasal cannula, but hemodynamically stable.  Exam with stigmata of dehydration but no distress.  Slight tachypnea to the low 20s.  Blood work with stable hemoglobin, and leukopenia.  She was reporting melena, but has been taking Tums and OTC medications that would cause this.  No occasions of GI bleed otherwise.  CXR with opacities consistent with COVID-19.  Due to her hypoxia, Decadron was provided, and we will admit to hospitalist medicine for further management.      ____________________________________________   FINAL CLINICAL IMPRESSION(S) / ED  DIAGNOSES  Final diagnoses:  COVID-19  Generalized weakness  Dehydration  Hypoxemia     ED Discharge Orders    None       Keyoni Lapinski   Note:  This document was prepared using Dragon voice recognition software and may include unintentional dictation errors.   Delton Prairie, MD 12/16/20 1116

## 2020-12-16 NOTE — H&P (Signed)
History and Physical    Brandi [name] IFO:[id number] DOB: 1956/11/21 DOA: 12/16/2020  PCP: [name], DO  Patient coming from: home   Chief Complaint: weakness, cough  HPI: Brandi [name] is a 65 y.o. female with medical history significant for former smoker who presents with the above.  Unvaccinated.  Middle of December developed nasal congestion and sinus pressure, was started on bactrim by pcp, but says taking it gave her nausea and diarrhea.  On 1/4 both she and her husband began to feel weak. Also body aches and subjective fever. Also dry cough and mild shortness of breath. Also acting a bit confused per the patient's daughter. No vomiting or diarrhea. No chest pain. No hemoptysis  ED Course:   Dexamethasone, 1 L fluids.  Review of Systems: As per HPI otherwise 10 point review of systems negative.    Past Medical History:  Diagnosis Date  . Hyperlipidemia     Past Surgical History:  Procedure Laterality Date  . APPENDECTOMY    . CHOLECYSTECTOMY    . SHOULDER ARTHROSCOPY Left 07/02/2015   Procedure: ,left shoulder arthroscopy, decompression and debridement;  Surgeon: Christena Flake, MD;  Location: [facility] ORS;  Service: Orthopedics;  Laterality: Left;  . SHOULDER CLOSED REDUCTION Left 04/09/2015   Procedure: CLOSED MANIPULATION SHOULDER;  Surgeon: Kennedy Bucker, MD;  Location: [facility] ORS;  Service: Orthopedics;  Laterality: Left;     reports that she quit smoking about 5 years ago. Her smoking use included e-cigarettes and cigarettes. She has a 30.75 pack-year smoking history. She has quit using smokeless tobacco. She reports that she does not drink alcohol and does not use drugs.  No Known Allergies  Family History  Problem Relation Age of Onset  . Breast cancer Mother 70  . Alzheimer's disease Mother     Prior to Admission medications   Medication Sig Start Date End Date Taking? Authorizing Provider  albuterol (VENTOLIN HFA) 108 (90 Base) MCG/ACT inhaler  Inhale 2 puffs into the lungs every 6 (six) hours as needed for wheezing or shortness of breath. 04/01/20   Gollan, Tollie Pizza, MD  alendronate (FOSAMAX) 70 MG tablet Take 1 tablet (70 mg total) by mouth once a week. Take with a full glass of water on an empty stomach. 11/04/20   [name], MD  aspirin EC 81 MG tablet Take 1 tablet (81 mg total) by mouth daily. 05/22/19   [name], MD  Calcium Carbonate-Vit D-Min (CALCIUM 1200) 1200-1000 MG-UNIT CHEW Chew 1,200 mg by mouth daily. 01/31/19   [name], Jaquelyn Bitter, MD  ezetimibe (ZETIA) 10 MG tablet Take 1 tablet (10 mg total) by mouth daily. 04/11/20 07/10/20  [name], MD  Investigational vitamin D 600 UNITS capsule SWOG [phone] Take 1 capsule (600 Units total) by mouth daily. Take with food. Patient taking differently: Take 600 Units by mouth daily. Take with food. Takes occasionally. 01/31/19   [name], Jaquelyn Bitter, MD  rosuvastatin (CRESTOR) 20 MG tablet Take 1 tablet (20 mg total) by mouth daily. 06/06/20 09/04/20  [name], MD    Physical Exam: Vitals:   12/16/20 0947 12/16/20 0746 12/16/20 0952 12/16/20 1432  BP:   120/84 127/61  Pulse:   96 88  Resp:   (!) 24 16  Temp:   99.7 F (37.6 C)   TempSrc:   Oral   SpO2:  93% 94% 92%  Weight: 54.4 kg     Height: 5\' 4"  (1.626 m)  Constitutional: No acute distress Head: Atraumatic Eyes: Conjunctiva clear ENM: Moist mucous membranes. Normal dentition.  Neck: Supple Respiratory: no respiratory distress, rr, scattered rales Cardiovascular: Regular rate and rhythm. No murmurs/rubs/gallops. Abdomen: Non-tender, non-distended. No masses. No rebound or guarding. Positive bowel sounds. Musculoskeletal: No joint deformity upper and lower extremities. Normal ROM, no contractures. Normal muscle tone.  Skin: No rashes, lesions, or ulcers.  Extremities: No peripheral edema. Palpable peripheral pulses. Neurologic: Alert, moving all 4 extremities. Psychiatric: Normal  insight and judgement.   Labs on Admission: I have personally reviewed following labs and imaging studies  CBC: Recent Labs  Lab 12/16/20 0741  WBC 3.2*  HGB 15.2*  HCT 43.5  MCV 92.2  PLT 144*   Basic Metabolic Panel: Recent Labs  Lab 12/16/20 0741  NA 133*  K 3.8  CL 93*  CO2 28  GLUCOSE 114*  BUN 15  CREATININE 0.52  CALCIUM 9.0   GFR: Estimated Creatinine Clearance: 60.2 mL/min (by C-G formula based on SCr of 0.52 mg/dL). Liver Function Tests: Recent Labs  Lab 12/16/20 0741  AST 52*  ALT 25  ALKPHOS 51  BILITOT 0.8  PROT 7.3  ALBUMIN 4.0   Recent Labs  Lab 12/16/20 0741  LIPASE 37   No results for input(s): AMMONIA in the last 168 hours. Coagulation Profile: No results for input(s): INR, PROTIME in the last 168 hours. Cardiac Enzymes: No results for input(s): CKTOTAL, CKMB, CKMBINDEX, TROPONINI in the last 168 hours. BNP (last 3 results) No results for input(s): PROBNP in the last 8760 hours. HbA1C: No results for input(s): HGBA1C in the last 72 hours. CBG: Recent Labs  Lab 12/16/20 0741  GLUCAP 105*   Lipid Profile: No results for input(s): CHOL, HDL, LDLCALC, TRIG, CHOLHDL, LDLDIRECT in the last 72 hours. Thyroid Function Tests: No results for input(s): TSH, T4TOTAL, FREET4, T3FREE, THYROIDAB in the last 72 hours. Anemia Panel: No results for input(s): VITAMINB12, FOLATE, FERRITIN, TIBC, IRON, RETICCTPCT in the last 72 hours. Urine analysis:    Component Value Date/Time   COLORURINE AMBER (A) 12/16/2020 1139   APPEARANCEUR HAZY (A) 12/16/2020 1139   LABSPEC 1.026 12/16/2020 1139   PHURINE 5.0 12/16/2020 1139   GLUCOSEU NEGATIVE 12/16/2020 1139   HGBUR NEGATIVE 12/16/2020 1139   BILIRUBINUR NEGATIVE 12/16/2020 1139   KETONESUR 20 (A) 12/16/2020 1139   PROTEINUR 100 (A) 12/16/2020 1139   NITRITE NEGATIVE 12/16/2020 1139   LEUKOCYTESUR NEGATIVE 12/16/2020 1139    Radiological Exams on Admission: DG Chest 2 View  Result Date:  12/16/2020 CLINICAL DATA:  Shortness of breath, hypoxia EXAM: CHEST - 2 VIEW COMPARISON:  CT chest dated 04/01/2020 FINDINGS: Faint patchy opacity in the left upper lobe/lingula and bilateral lower lobes, suggesting mild multifocal pneumonia. No pleural effusion or pneumothorax. The heart is normal in size. Visualized osseous structures are within normal limits. IMPRESSION: Mild multifocal pneumonia. Electronically Signed   By: Charline Bills M.D.   On: 12/16/2020 08:12    EKG: Independently reviewed. Sinus tachycardia  Assessment/Plan Active Problems:   Acute hypoxemic respiratory failure due to COVID-19 [facility])    # Acute hypoxemic respiratory failure 2/2 covid Unvaccinated, here o2 89 improved to normal on 2 L. cxr w/ multifocal pneumonia. Hemodynamically stable. Symptoms appear to have begun less than 1 week ago. No chest pain and trops normal - o2 - remdesivir and decadron - pulmonary toilet and daily inflammatory markers  # Hyponatremia Mild, 133, likely 2/2 above illness - trend  # Transaminitis # thrombocytopenia Mild,  ast 52 and plts 144. Likely 2/2 covid infection. Husband does have hx hcv infection - monitor - f/u hcv antibody  # ASCVD risk Coronary calcifications on ct - cont home aspirin, zetia, crestor   DVT prophylaxis: lovenox Code Status: full  Family Communication: daughter updated telephonically  Consults called: none    Status is: Inpatient  Remains inpatient appropriate because:Inpatient level of care appropriate due to severity of illness   Dispo: The patient is from: Home              Anticipated d/c is to: Home              Anticipated d/c date is: 2 days              Patient currently is not medically stable to d/c.        Silvano Bilis MD [facility] Pager [phone]  If 7PM-7AM, please contact night-coverage [email or web address] Password [facility]  12/16/2020, 2:38 PM

## 2020-12-16 NOTE — Progress Notes (Signed)
Spoke with daughter Brandi [name] and provided an update on current state. Notified her of the procedure for updates daily, visitation policy, discharge planning, and gave her the number to call into the room. She verbalized understanding and was appreciative of the update.

## 2020-12-17 DIAGNOSIS — J9601 Acute respiratory failure with hypoxia: Secondary | ICD-10-CM | POA: Diagnosis not present

## 2020-12-17 DIAGNOSIS — E860 Dehydration: Secondary | ICD-10-CM | POA: Diagnosis not present

## 2020-12-17 DIAGNOSIS — R531 Weakness: Secondary | ICD-10-CM

## 2020-12-17 DIAGNOSIS — U071 COVID-19: Secondary | ICD-10-CM | POA: Diagnosis not present

## 2020-12-17 LAB — HIV ANTIBODY (ROUTINE TESTING W REFLEX): HIV Screen 4th Generation wRfx: NONREACTIVE

## 2020-12-17 LAB — PHOSPHORUS: Phosphorus: 3.7 mg/dL (ref 2.5–4.6)

## 2020-12-17 LAB — COMPREHENSIVE METABOLIC PANEL
ALT: 21 U/L (ref 0–44)
AST: 39 U/L (ref 15–41)
Albumin: 3.3 g/dL — ABNORMAL LOW (ref 3.5–5.0)
Alkaline Phosphatase: 42 U/L (ref 38–126)
Anion gap: 10 (ref 5–15)
BUN: 13 mg/dL (ref 8–23)
CO2: 30 mmol/L (ref 22–32)
Calcium: 8.8 mg/dL — ABNORMAL LOW (ref 8.9–10.3)
Chloride: 97 mmol/L — ABNORMAL LOW (ref 98–111)
Creatinine, Ser: 0.4 mg/dL — ABNORMAL LOW (ref 0.44–1.00)
GFR, Estimated: >60 mL/min (ref >60)
Glucose, Bld: 117 mg/dL — ABNORMAL HIGH (ref 70–99)
Potassium: 4.3 mmol/L (ref 3.5–5.1)
Sodium: 137 mmol/L (ref 135–145)
Total Bilirubin: 0.5 mg/dL (ref 0.3–1.2)
Total Protein: 6.1 g/dL — ABNORMAL LOW (ref 6.5–8.1)

## 2020-12-17 LAB — CBC WITH DIFFERENTIAL/PLATELET
Abs Immature Granulocytes: 0.02 10*3/uL (ref 0.00–0.07)
Basophils Absolute: 0 10*3/uL (ref 0.0–0.1)
Basophils Relative: 0 %
Eosinophils Absolute: 0 10*3/uL (ref 0.0–0.5)
Eosinophils Relative: 0 %
HCT: 40.1 % (ref 36.0–46.0)
Hemoglobin: 13.7 g/dL (ref 12.0–15.0)
Immature Granulocytes: 1 %
Lymphocytes Relative: 33 %
Lymphs Abs: 0.9 10*3/uL (ref 0.7–4.0)
MCH: 31.6 pg (ref 26.0–34.0)
MCHC: 34.2 g/dL (ref 30.0–36.0)
MCV: 92.6 fL (ref 80.0–100.0)
Monocytes Absolute: 0.5 10*3/uL (ref 0.1–1.0)
Monocytes Relative: 18 %
Neutro Abs: 1.4 10*3/uL — ABNORMAL LOW (ref 1.7–7.7)
Neutrophils Relative %: 48 %
Platelets: 154 10*3/uL (ref 150–400)
RBC: 4.33 MIL/uL (ref 3.87–5.11)
RDW: 12.2 % (ref 11.5–15.5)
WBC: 2.8 10*3/uL — ABNORMAL LOW (ref 4.0–10.5)
nRBC: 0 % (ref 0.0–0.2)

## 2020-12-17 LAB — D-DIMER, QUANTITATIVE: D-Dimer, Quant: 0.4 ug/mL-FEU (ref 0.00–0.50)

## 2020-12-17 LAB — C-REACTIVE PROTEIN: CRP: 1.3 mg/dL — ABNORMAL HIGH (ref <1.0)

## 2020-12-17 LAB — FERRITIN: Ferritin: 437 ng/mL — ABNORMAL HIGH (ref 11–307)

## 2020-12-17 LAB — HEPATITIS C ANTIBODY: HCV Ab: NONREACTIVE

## 2020-12-17 LAB — MAGNESIUM: Magnesium: 2 mg/dL (ref 1.7–2.4)

## 2020-12-17 MED ORDER — ADULT MULTIVITAMIN W/MINERALS CH
1.0000 | ORAL_TABLET | Freq: Every day | ORAL | Status: DC
  Administered 2020-12-17 – 2020-12-23 (×7): 1 via ORAL
  Filled 2020-12-17 (×7): qty 1

## 2020-12-17 MED ORDER — METHYLPREDNISOLONE SODIUM SUCC 40 MG IJ SOLR
30.0000 mg | Freq: Two times a day (BID) | INTRAMUSCULAR | Status: DC
  Administered 2020-12-17 – 2020-12-21 (×10): 30 mg via INTRAVENOUS
  Filled 2020-12-17 (×10): qty 1

## 2020-12-17 MED ORDER — ENSURE ENLIVE PO LIQD
237.0000 mL | Freq: Two times a day (BID) | ORAL | Status: DC
  Administered 2020-12-17 – 2020-12-23 (×12): 237 mL via ORAL

## 2020-12-17 MED ORDER — ALUM & MAG HYDROXIDE-SIMETH 200-200-20 MG/5ML PO SUSP
30.0000 mL | ORAL | Status: DC | PRN
  Administered 2020-12-18 (×2): 30 mL via ORAL
  Filled 2020-12-17 (×2): qty 30

## 2020-12-17 NOTE — Progress Notes (Signed)
Initial Nutrition Assessment  RD working remotely.  DOCUMENTATION CODES:   Not applicable  INTERVENTION:  - will order Ensure Enlive BID, each supplement provides 350 kcal and 20 grams of protein. - will order 1 tablet multivitamin with minerals/day.   NUTRITION DIAGNOSIS:   Increased nutrient needs related to acute illness,catabolic illness (COVID-19 infection) as evidenced by estimated needs.  GOAL:   Patient will meet greater than or equal to 90% of their needs  MONITOR:   PO intake,Supplement acceptance,Labs,Weight trends  REASON FOR ASSESSMENT:   Malnutrition Screening Tool  ASSESSMENT:   65 year old female with medical history of prior tobacco use and HLD. She presented to the ED with weakness, cough, fevers, body aches, and shortness of breath which began on 1/4. Daughter reported that patient had also been slightly confused PTA. She is unvaccinated against COVID-19. She was found to be COVID positive with CXR showing multifocal PNA.  RD is working on a different campus. No intakes documented since admission. Called patient's room and was able to talk with her husband, who is admitted to the bed beside her. Patient was unavailable for discussion at the time of call. Husband indicates that patient has had a decreased appetite for the past ~1 week, but did not provide any other information about her.   Weight yesterday was 119 lb, weight on 12/29 was 122 lb, and weight on 11/29 was 124 lb. This indicates 5 lb weight loss (4% body weight) in the past 1.5 months; not significant for time frame.    Labs reviewed; CBG: 105 mg/dl, Cl: 97 mmol/l, creatinine: 0.4 mg/dl. Medications reviewed; 30 mg solu-medrol BID, 200 mg IV remdesivir x1 dose 1/10, 100 mg IV remdesivir x1 dose/day x4 days (1/11-1/14).    NUTRITION - FOCUSED PHYSICAL EXAM:  unable to complete at this time.   Diet Order:   Diet Order            Diet regular Room service appropriate? Yes; Fluid consistency:  Thin  Diet effective now                 EDUCATION NEEDS:   Not appropriate for education at this time  Skin:  Skin Assessment: Reviewed RN Assessment  Last BM:  PTA/unknown  Height:   Ht Readings from Last 1 Encounters:  12/16/20 5\' 4"  (1.626 m)    Weight:   Wt Readings from Last 1 Encounters:  12/16/20 54.1 kg    Estimated Nutritional Needs:  Kcal:  1650-1850 kcal Protein:  85-100 grams Fluid:  >/= 2.2 L/day      02/13/21, MS, RD, LDN, CNSC Inpatient Clinical Dietitian RD pager # available in [facility]  After hours/weekend pager # available in [facility]

## 2020-12-17 NOTE — Progress Notes (Signed)
PROGRESS NOTE  Brandi [name] ZJI:[id number] DOB: 1956/09/08 DOA: 12/16/2020 PCP: [name], DO   LOS: 1 day   Brief Narrative / Interim history: 65 year old female with history of prior tobacco use, unvaccinated, comes to the hospital with weakness, cough, shortness of breath.  She apparently had URI type symptoms in the middle of December and received an antibiotic as an outpatient.  About a week ago on 1/4 both she and her husband began feeling weak, body aches, fevers along with cough and shortness of breath.  Daughter also tells that patient was being a little bit confused.  She was diagnosed with COVID-19, chest x-ray showed multifocal pneumonia and was admitted to the hospital.  Subjective / 24h Interval events: Complains of weakness, shortness of breath.  No chest pain, no abdominal pain, no nausea or vomiting.  Assessment & Plan:  Principal Problem Acute Hypoxic Respiratory Failure due to Covid-19 Viral Illness -Chest x-ray on admission showed mild multifocal pneumonia.  She has minimal oxygen requirement of 2 L nasal cannula.  Patient was started on Remdesivir along with steroids, continue.  No need for baricitinib currently unless her oxygenation worsens. -Continue incentive spirometry, flutter valve, proning as able, sit up in the chair during daytime   COVID-19 Labs  Recent Labs    12/17/20 0400  FERRITIN 437*    Active Problems Hyponatremia -Likely due to dehydration, sodium improved after IV fluids in the ED.  Elevated LFTs -Minimal, likely due to Covid  Thrombocytopenia -Resolved, in the setting of viral infection  Leukopenia -Due to Covid  Hyperlipidemia -Continue statin   Scheduled Meds: . aspirin EC  81 mg Oral Daily  . dexamethasone  6 mg Oral Q24H  . enoxaparin (LOVENOX) injection  40 mg Subcutaneous Q24H  . ezetimibe  10 mg Oral Daily  . rosuvastatin  20 mg Oral Daily  . sodium chloride flush  3 mL Intravenous Q12H   Continuous  Infusions: . sodium chloride 250 mL (12/17/20 0939)  . remdesivir 100 mg in NS 100 mL 100 mg (12/17/20 0940)   PRN Meds:.sodium chloride, acetaminophen, albuterol, alum & mag hydroxide-simeth, guaiFENesin-dextromethorphan, sodium chloride flush  DVT prophylaxis: Lovenox  Code Status: Full code Family Communication: updated husband at bedside (he is also hospitalized in the bed next to her)   Status is: Inpatient  Remains inpatient appropriate because:Inpatient level of care appropriate due to severity of illness  Dispo: The patient is from: Home              Anticipated d/c is to: Home              Anticipated d/c date is: 2 days              Patient currently is not medically stable to d/c.  Consultants:  None   Procedures:  None   Microbiology: None   Antibacterials: None    Objective: Vitals:   12/16/20 2116 12/16/20 2358 12/17/20 0413 12/17/20 0735  BP: 127/65 104/69 117/72 100/73  Pulse: 93 80 80 83  Resp: 20 18 18 17   Temp: 98.5 F (36.9 C) 98.5 F (36.9 C) 97.9 F (36.6 C) 98.4 F (36.9 C)  TempSrc:      SpO2: 95% 95% 90% 95%  Weight: 54.1 kg     Height: 5\' 4"  (1.626 m)       Intake/Output Summary (Last 24 hours) at 12/17/2020 1027 Last data filed at 12/16/2020 1926 Gross per 24 hour  Intake 1200 ml  Output --  Net 1200 ml   Filed Weights   12/16/20 0736 12/16/20 2116  Weight: 54.4 kg 54.1 kg    Examination:  Constitutional: NAD Eyes: no scleral icterus ENMT: Mucous membranes are moist.  Neck: normal, supple Respiratory: Diminished at the bases but overall clear to auscultation bilaterally, no wheezing, no crackles.  Tachypneic Cardiovascular: Regular rate and rhythm, no murmurs / rubs / gallops. No LE edema.  Abdomen: non distended, no tenderness. Bowel sounds positive.  Musculoskeletal: no clubbing / cyanosis.  Skin: no rashes Neurologic: CN 2-12 grossly intact. Strength 5/5 in all 4.   Data Reviewed: I have independently reviewed  following labs and imaging studies   CBC: Recent Labs  Lab 12/16/20 0741 12/17/20 0400  WBC 3.2* 2.8*  NEUTROABS  --  1.4*  HGB 15.2* 13.7  HCT 43.5 40.1  MCV 92.2 92.6  PLT 144* 154   Basic Metabolic Panel: Recent Labs  Lab 12/16/20 0741 12/17/20 0400  NA 133* 137  K 3.8 4.3  CL 93* 97*  CO2 28 30  GLUCOSE 114* 117*  BUN 15 13  CREATININE 0.52 0.40*  CALCIUM 9.0 8.8*  MG  --  2.0  PHOS  --  3.7   GFR: Estimated Creatinine Clearance: 59.9 mL/min (A) (by C-G formula based on SCr of 0.4 mg/dL (L)). Liver Function Tests: Recent Labs  Lab 12/16/20 0741 12/17/20 0400  AST 52* 39  ALT 25 21  ALKPHOS 51 42  BILITOT 0.8 0.5  PROT 7.3 6.1*  ALBUMIN 4.0 3.3*   Recent Labs  Lab 12/16/20 0741  LIPASE 37   No results for input(s): AMMONIA in the last 168 hours. Coagulation Profile: No results for input(s): INR, PROTIME in the last 168 hours. Cardiac Enzymes: No results for input(s): CKTOTAL, CKMB, CKMBINDEX, TROPONINI in the last 168 hours. BNP (last 3 results) No results for input(s): PROBNP in the last 8760 hours. HbA1C: No results for input(s): HGBA1C in the last 72 hours. CBG: Recent Labs  Lab 12/16/20 0741  GLUCAP 105*   Lipid Profile: No results for input(s): CHOL, HDL, LDLCALC, TRIG, CHOLHDL, LDLDIRECT in the last 72 hours. Thyroid Function Tests: No results for input(s): TSH, T4TOTAL, FREET4, T3FREE, THYROIDAB in the last 72 hours. Anemia Panel: Recent Labs    12/17/20 0400  FERRITIN 437*   Urine analysis:    Component Value Date/Time   COLORURINE AMBER (A) 12/16/2020 1139   APPEARANCEUR HAZY (A) 12/16/2020 1139   LABSPEC 1.026 12/16/2020 1139   PHURINE 5.0 12/16/2020 1139   GLUCOSEU NEGATIVE 12/16/2020 1139   HGBUR NEGATIVE 12/16/2020 1139   BILIRUBINUR NEGATIVE 12/16/2020 1139   KETONESUR 20 (A) 12/16/2020 1139   PROTEINUR 100 (A) 12/16/2020 1139   NITRITE NEGATIVE 12/16/2020 1139   LEUKOCYTESUR NEGATIVE 12/16/2020 1139   Sepsis  Labs: Invalid input(s): PROCALCITONIN, LACTICIDVEN  No results found for this or any previous visit (from the past 240 hour(s)).    Radiology Studies: DG Chest 2 View  Result Date: 12/16/2020 CLINICAL DATA:  Shortness of breath, hypoxia EXAM: CHEST - 2 VIEW COMPARISON:  CT chest dated 04/01/2020 FINDINGS: Faint patchy opacity in the left upper lobe/lingula and bilateral lower lobes, suggesting mild multifocal pneumonia. No pleural effusion or pneumothorax. The heart is normal in size. Visualized osseous structures are within normal limits. IMPRESSION: Mild multifocal pneumonia. Electronically Signed   By: Charline Bills M.D.   On: 12/16/2020 08:12    Pamella Pert, MD, PhD [facility]  Between 7 am - 7 pm I  am available, please contact me via Amion or Securechat  Between 7 pm - 7 am I am not available, please contact night coverage MD/APP via Amion

## 2020-12-18 DIAGNOSIS — U071 COVID-19: Secondary | ICD-10-CM | POA: Diagnosis not present

## 2020-12-18 DIAGNOSIS — J9601 Acute respiratory failure with hypoxia: Secondary | ICD-10-CM | POA: Diagnosis not present

## 2020-12-18 LAB — COMPREHENSIVE METABOLIC PANEL
ALT: 22 U/L (ref 0–44)
AST: 33 U/L (ref 15–41)
Albumin: 3.1 g/dL — ABNORMAL LOW (ref 3.5–5.0)
Alkaline Phosphatase: 43 U/L (ref 38–126)
Anion gap: 10 (ref 5–15)
BUN: 17 mg/dL (ref 8–23)
CO2: 29 mmol/L (ref 22–32)
Calcium: 8.6 mg/dL — ABNORMAL LOW (ref 8.9–10.3)
Chloride: 98 mmol/L (ref 98–111)
Creatinine, Ser: 0.52 mg/dL (ref 0.44–1.00)
GFR, Estimated: >60 mL/min (ref >60)
Glucose, Bld: 140 mg/dL — ABNORMAL HIGH (ref 70–99)
Potassium: 3.9 mmol/L (ref 3.5–5.1)
Sodium: 137 mmol/L (ref 135–145)
Total Bilirubin: 0.5 mg/dL (ref 0.3–1.2)
Total Protein: 5.7 g/dL — ABNORMAL LOW (ref 6.5–8.1)

## 2020-12-18 LAB — CBC WITH DIFFERENTIAL/PLATELET
Abs Immature Granulocytes: 0.02 10*3/uL (ref 0.00–0.07)
Basophils Absolute: 0 10*3/uL (ref 0.0–0.1)
Basophils Relative: 0 %
Eosinophils Absolute: 0 10*3/uL (ref 0.0–0.5)
Eosinophils Relative: 0 %
HCT: 38.6 % (ref 36.0–46.0)
Hemoglobin: 13.2 g/dL (ref 12.0–15.0)
Immature Granulocytes: 1 %
Lymphocytes Relative: 30 %
Lymphs Abs: 1.3 10*3/uL (ref 0.7–4.0)
MCH: 31.3 pg (ref 26.0–34.0)
MCHC: 34.2 g/dL (ref 30.0–36.0)
MCV: 91.5 fL (ref 80.0–100.0)
Monocytes Absolute: 0.4 10*3/uL (ref 0.1–1.0)
Monocytes Relative: 9 %
Neutro Abs: 2.5 10*3/uL (ref 1.7–7.7)
Neutrophils Relative %: 60 %
Platelets: 188 10*3/uL (ref 150–400)
RBC: 4.22 MIL/uL (ref 3.87–5.11)
RDW: 12.3 % (ref 11.5–15.5)
Smear Review: NORMAL
WBC: 4.1 10*3/uL (ref 4.0–10.5)
nRBC: 0 % (ref 0.0–0.2)

## 2020-12-18 LAB — C-REACTIVE PROTEIN: CRP: 0.7 mg/dL (ref <1.0)

## 2020-12-18 LAB — PHOSPHORUS: Phosphorus: 3.8 mg/dL (ref 2.5–4.6)

## 2020-12-18 LAB — MAGNESIUM: Magnesium: 2.1 mg/dL (ref 1.7–2.4)

## 2020-12-18 LAB — FIBRIN DERIVATIVES D-DIMER (ARMC ONLY): Fibrin derivatives D-dimer (ARMC): 545.32 ng/mL (FEU) — ABNORMAL HIGH (ref 0.00–499.00)

## 2020-12-18 LAB — FERRITIN: Ferritin: 443 ng/mL — ABNORMAL HIGH (ref 11–307)

## 2020-12-18 MED ORDER — FAMOTIDINE 20 MG PO TABS
20.0000 mg | ORAL_TABLET | Freq: Every day | ORAL | Status: DC
  Administered 2020-12-18 – 2020-12-23 (×6): 20 mg via ORAL
  Filled 2020-12-18 (×6): qty 1

## 2020-12-18 MED ORDER — MAGIC MOUTHWASH
10.0000 mL | Freq: Four times a day (QID) | ORAL | Status: DC
  Administered 2020-12-18 – 2020-12-23 (×17): 10 mL via ORAL
  Filled 2020-12-18 (×15): qty 10

## 2020-12-18 MED ORDER — ALUM & MAG HYDROXIDE-SIMETH 200-200-20 MG/5ML PO SUSP: 15.0000 mL | Freq: Four times a day (QID) | ORAL | Status: DC | PRN

## 2020-12-18 NOTE — Progress Notes (Signed)
PROGRESS NOTE    Brandi [name]   TTS:[id number]  DOB: [birth year]  PCP: [name], DO    DOA: 12/16/2020 LOS: 2   Brief Narrative   65 year old female with history of prior tobacco use, unvaccinated, comes to the hospital with weakness, cough, shortness of breath.  She apparently had URI type symptoms in the middle of December and received an antibiotic as an outpatient.  About a week ago on 1/4 both she and her husband began feeling weak, body aches, fevers along with cough and shortness of breath.  Daughter also tells that patient was being a little bit confused.  She was diagnosed with COVID-19, chest x-ray showed multifocal pneumonia and was admitted to the hospital.     Assessment & Plan   Active Problems:   Acute hypoxemic respiratory failure due to COVID-19 [facility])   Acute respiratory failure with hypoxia secondary to COVID-19 multifocal pneumonia -present on admission with shortness of breath and requirement for 2 L/min supplemental oxygen to maintain O2 saturation. -- Continue remdesivir, steroids -- Consider baricitinib depending on her oxygen requirement and trend of inflammatory markers -- Incentive spirometry, flutter valve, proning as tolerated, up in chair, ambulate -- Supplemental oxygen as needed to maintain O2 sat greater than 88%, wean as tolerated   Hyponatremia -POA, likely due to dehydration.  Sodium improved after IV hydration.  Monitor.  Elevated LFTs -mild, likely due to COVID infection.  Monitor CMP.  Thrombocytopenia -POA, resolved, likely due to COVID infection  Leukopenia -POA due to COVID.  Monitor CBC  Hyperlipidemia -continue statin     DVT prophylaxis: enoxaparin (LOVENOX) injection 40 mg Start: 12/16/20 2200   Diet:  Diet Orders (From admission, onward)    Start     Ordered   12/16/20 1444  Diet regular Room service appropriate? Yes; Fluid consistency: Thin  Diet effective now       Question Answer Comment  Room service  appropriate? Yes   Fluid consistency: Thin      12/16/20 1443            Code Status: Full Code    Subjective 12/18/20    Patient seen at bedside today.  Husband is admitted in the same room.  Patient reports feeling very weak.  Also extremely short of breath when up to ambulate to the bathroom.  She also reports very frequent belching but no abdominal pain nausea or vomiting.   Disposition Plan & Communication   Status is: Inpatient  Remains inpatient appropriate because:Inpatient level of care appropriate due to severity of illness.  Requiring IV therapies and oxygen as above for COVID-19 infection   Dispo: The patient is from: Home              Anticipated d/c is to: Home              Anticipated d/c date is: 1 to 2 days              Patient currently is not medically stable to d/c.   Family Communication: Husband present on rounds   Consults, Procedures, Significant Events   Consultants:   None  Procedures:   None  Antimicrobials:  Anti-infectives (From admission, onward)   Start     Dose/Rate Route Frequency Ordered Stop   12/17/20 1000  remdesivir 100 mg in sodium chloride 0.9 % 100 mL IVPB       "Followed by" Linked Group Details   100 mg 200 mL/hr over 30 Minutes Intravenous  Daily 12/16/20 1443 12/21/20 0959   12/16/20 1530  remdesivir 200 mg in sodium chloride 0.9% 250 mL IVPB       "Followed by" Linked Group Details   200 mg 580 mL/hr over 30 Minutes Intravenous Once 12/16/20 1443 12/16/20 1926        Objective   Vitals:   12/18/20 0052 12/18/20 0537 12/18/20 0805 12/18/20 1212  BP: 111/77 121/66 100/70 114/69  Pulse: 74 70 80 79  Resp: 18 18 18 16   Temp: (!) 97.5 F (36.4 C) 98 F (36.7 C) 97.6 F (36.4 C) (!) 97.4 F (36.3 C)  TempSrc:      SpO2: 95% 95% 90% 94%  Weight:      Height:        Intake/Output Summary (Last 24 hours) at 12/18/2020 1340 Last data filed at 12/17/2020 1600 Gross per 24 hour  Intake 105.74 ml  Output --   Net 105.74 ml   Filed Weights   12/16/20 0736 12/16/20 2116  Weight: 54.4 kg 54.1 kg    Physical Exam:  General exam: awake, alert, no acute distress HEENT: moist mucus membranes, hearing grossly normal  Respiratory system: Decreased breath sounds, no wheezes, normal respiratory effort at rest, on 2 L/min nasal cannula oxygen Cardiovascular system: normal S1/S2, RRR, no pedal edema.   Gastrointestinal system: soft, NT, ND Central nervous system: A&O x3. no gross focal neurologic deficits, normal speech Extremities: moves all, no edema, normal tone Skin: dry, intact, normal temperature Psychiatry: normal mood, congruent affect, judgement and insight appear normal  Labs   Data Reviewed: I have personally reviewed following labs and imaging studies  CBC: Recent Labs  Lab 12/16/20 0741 12/17/20 0400 12/18/20 0614  WBC 3.2* 2.8* 4.1  NEUTROABS  --  1.4* 2.5  HGB 15.2* 13.7 13.2  HCT 43.5 40.1 38.6  MCV 92.2 92.6 91.5  PLT 144* 154 188   Basic Metabolic Panel: Recent Labs  Lab 12/16/20 0741 12/17/20 0400 12/18/20 0614  NA 133* 137 137  K 3.8 4.3 3.9  CL 93* 97* 98  CO2 28 30 29   GLUCOSE 114* 117* 140*  BUN 15 13 17   CREATININE 0.52 0.40* 0.52  CALCIUM 9.0 8.8* 8.6*  MG  --  2.0 2.1  PHOS  --  3.7 3.8   GFR: Estimated Creatinine Clearance: 59.9 mL/min (by C-G formula based on SCr of 0.52 mg/dL). Liver Function Tests: Recent Labs  Lab 12/16/20 0741 12/17/20 0400 12/18/20 0614  AST 52* 39 33  ALT 25 21 22   ALKPHOS 51 42 43  BILITOT 0.8 0.5 0.5  PROT 7.3 6.1* 5.7*  ALBUMIN 4.0 3.3* 3.1*   Recent Labs  Lab 12/16/20 0741  LIPASE 37   No results for input(s): AMMONIA in the last 168 hours. Coagulation Profile: No results for input(s): INR, PROTIME in the last 168 hours. Cardiac Enzymes: No results for input(s): CKTOTAL, CKMB, CKMBINDEX, TROPONINI in the last 168 hours. BNP (last 3 results) No results for input(s): PROBNP in the last 8760  hours. HbA1C: No results for input(s): HGBA1C in the last 72 hours. CBG: Recent Labs  Lab 12/16/20 0741  GLUCAP 105*   Lipid Profile: No results for input(s): CHOL, HDL, LDLCALC, TRIG, CHOLHDL, LDLDIRECT in the last 72 hours. Thyroid Function Tests: No results for input(s): TSH, T4TOTAL, FREET4, T3FREE, THYROIDAB in the last 72 hours. Anemia Panel: Recent Labs    12/17/20 0400 12/18/20 0614  FERRITIN 437* 443*   Sepsis Labs: No results for input(s):  PROCALCITON, LATICACIDVEN in the last 168 hours.  No results found for this or any previous visit (from the past 240 hour(s)).    Imaging Studies   No results found.   Medications   Scheduled Meds: . aspirin EC  81 mg Oral Daily  . enoxaparin (LOVENOX) injection  40 mg Subcutaneous Q24H  . ezetimibe  10 mg Oral Daily  . famotidine  20 mg Oral Daily  . feeding supplement  237 mL Oral BID BM  . magic mouthwash  10 mL Oral QID  . methylPREDNISolone (SOLU-MEDROL) injection  30 mg Intravenous Q12H  . multivitamin with minerals  1 tablet Oral Daily  . rosuvastatin  20 mg Oral Daily  . sodium chloride flush  3 mL Intravenous Q12H   Continuous Infusions: . sodium chloride 250 mL (12/17/20 0939)  . remdesivir 100 mg in NS 100 mL 100 mg (12/18/20 0926)       LOS: 2 days    Time spent: 30 minutes    [name], DO [facility]  12/18/2020, 1:40 PM    If 7PM-7AM, please contact night-coverage. How to contact the [facility] Attending or Consulting provider 7A - 7P or covering provider during after hours 7P -7A, for this patient?    1. Check the care team in [facility] and look for a) attending/consulting [facility] provider listed and b) the [facility] team listed 2. Log into [email or web address] and use [place]'s universal password to access. If you do not have the password, please contact the hospital operator. 3. Locate the [facility] provider you are looking for under [facility] and page to a number that you can be directly  reached. 4. If you still have difficulty reaching the provider, please page the [facility] (Director on Call) for the Hospitalists listed on amion for assistance.

## 2020-12-18 NOTE — Progress Notes (Signed)
CH visited pt. and her husband in same room on 1C per RN suggestion; pt. lying in bed awake.  She shared that she has been having 'panic attacks' and feeling like she can't breathe and she asked for prayer for spiritual protection, associating this fear with spiritual oppression.  CH prayed for this concern and for pt. and husband's recovery.  CH remains available as needed.

## 2020-12-18 NOTE — Hospital Course (Signed)
65 year old female with history of prior tobacco use, unvaccinated, comes to the hospital with weakness, cough, shortness of breath.  She apparently had URI type symptoms in the middle of December and received an antibiotic as an outpatient.  About a week ago on 1/4 both she and her husband began feeling weak, body aches, fevers along with cough and shortness of breath.  Daughter also tells that patient was being a little bit confused.  She was diagnosed with COVID-19, chest x-ray showed multifocal pneumonia and was admitted to the hospital.

## 2020-12-19 DIAGNOSIS — J9601 Acute respiratory failure with hypoxia: Secondary | ICD-10-CM | POA: Diagnosis not present

## 2020-12-19 DIAGNOSIS — U071 COVID-19: Secondary | ICD-10-CM | POA: Diagnosis not present

## 2020-12-19 LAB — CBC WITH DIFFERENTIAL/PLATELET
Abs Immature Granulocytes: 0.02 10*3/uL (ref 0.00–0.07)
Basophils Absolute: 0 10*3/uL (ref 0.0–0.1)
Basophils Relative: 0 %
Eosinophils Absolute: 0 10*3/uL (ref 0.0–0.5)
Eosinophils Relative: 0 %
HCT: 39.8 % (ref 36.0–46.0)
Hemoglobin: 13.6 g/dL (ref 12.0–15.0)
Immature Granulocytes: 0 %
Lymphocytes Relative: 20 %
Lymphs Abs: 1 10*3/uL (ref 0.7–4.0)
MCH: 31.6 pg (ref 26.0–34.0)
MCHC: 34.2 g/dL (ref 30.0–36.0)
MCV: 92.3 fL (ref 80.0–100.0)
Monocytes Absolute: 0.6 10*3/uL (ref 0.1–1.0)
Monocytes Relative: 12 %
Neutro Abs: 3.3 10*3/uL (ref 1.7–7.7)
Neutrophils Relative %: 68 %
Platelets: 236 10*3/uL (ref 150–400)
RBC: 4.31 MIL/uL (ref 3.87–5.11)
RDW: 12.3 % (ref 11.5–15.5)
Smear Review: NORMAL
WBC: 4.9 10*3/uL (ref 4.0–10.5)
nRBC: 0 % (ref 0.0–0.2)

## 2020-12-19 LAB — C-REACTIVE PROTEIN: CRP: 0.5 mg/dL (ref <1.0)

## 2020-12-19 LAB — COMPREHENSIVE METABOLIC PANEL
ALT: 24 U/L (ref 0–44)
AST: 33 U/L (ref 15–41)
Albumin: 3.2 g/dL — ABNORMAL LOW (ref 3.5–5.0)
Alkaline Phosphatase: 40 U/L (ref 38–126)
Anion gap: 9 (ref 5–15)
BUN: 20 mg/dL (ref 8–23)
CO2: 32 mmol/L (ref 22–32)
Calcium: 8.7 mg/dL — ABNORMAL LOW (ref 8.9–10.3)
Chloride: 99 mmol/L (ref 98–111)
Creatinine, Ser: 0.54 mg/dL (ref 0.44–1.00)
GFR, Estimated: >60 mL/min (ref >60)
Glucose, Bld: 156 mg/dL — ABNORMAL HIGH (ref 70–99)
Potassium: 4 mmol/L (ref 3.5–5.1)
Sodium: 140 mmol/L (ref 135–145)
Total Bilirubin: 0.5 mg/dL (ref 0.3–1.2)
Total Protein: 5.9 g/dL — ABNORMAL LOW (ref 6.5–8.1)

## 2020-12-19 LAB — FIBRIN DERIVATIVES D-DIMER (ARMC ONLY): Fibrin derivatives D-dimer (ARMC): 397.02 ng/mL (FEU) (ref 0.00–499.00)

## 2020-12-19 LAB — FERRITIN: Ferritin: 471 ng/mL — ABNORMAL HIGH (ref 11–307)

## 2020-12-19 LAB — MAGNESIUM: Magnesium: 2.4 mg/dL (ref 1.7–2.4)

## 2020-12-19 LAB — PHOSPHORUS: Phosphorus: 3.7 mg/dL (ref 2.5–4.6)

## 2020-12-19 MED ORDER — LOPERAMIDE HCL 2 MG PO CAPS: 2.0000 mg | ORAL_CAPSULE | ORAL | Status: DC | PRN

## 2020-12-19 MED ORDER — ALPRAZOLAM 0.25 MG PO TABS
0.2500 mg | ORAL_TABLET | Freq: Three times a day (TID) | ORAL | Status: DC | PRN
  Administered 2020-12-19 – 2020-12-21 (×5): 0.25 mg via ORAL
  Filled 2020-12-19 (×5): qty 1

## 2020-12-19 NOTE — Progress Notes (Signed)
Patient Saturations on Room Air at Rest = 90-91*%  Patient Saturations on [employer] while Ambulating = 86-82%  Patient Saturations on 6-8 Liters of oxygen while Ambulating = 86-88%  Please briefly explain why patient needs home oxygen: Pt dropped to 82% on RA, oxygen had to be increased to 8L-10L in order for pt to recover. See Flow sheet.

## 2020-12-19 NOTE — Evaluation (Signed)
Physical Therapy Evaluation Patient Details Name: Brandi [name] MRN: [id number] DOB: 11-24-1956 Today's Date: 12/19/2020   History of Present Illness  Pt is a 65 y.o. female presenting to hospital 1/10 with generalized weakness, fatigue, abdominal pain, melena, decreased appetitie, and presyncopal lightheadedness.  Pt admitted with acute hypoxemic respiratory failure secondary to COVID-19 multifocal PNA, hyponatremia, transaminitis, thrombocytopenia.  PMH includes sinusitis, centrilobar emphysema, L shoulder closed reduction and shoulder arthroscopy.  Clinical Impression  Prior to hospital admission, pt was independent with ambulation; lives with her husband (who just discharged home from the hospital today) in 1 level home with 3 STE R railing.  Currently pt is CGA with transfers and ambulation 20 feet x2 with RW.  O2 sats 91% or greater on 4 L O2 via nasal cannula during sessions activities.  Generalized weakness and SOB noted with therapy activities.  Pt would benefit from skilled PT to address noted impairments and functional limitations (see below for any additional details).  Upon hospital discharge, pt would benefit from HHPT.    Follow Up Recommendations Home health PT    Equipment Recommendations  Rolling walker with 5" wheels;3in1 (PT)    Recommendations for Other Services OT consult     Precautions / Restrictions Precautions Precautions: Fall Restrictions Weight Bearing Restrictions: No      Mobility  Bed Mobility Overal bed mobility: Needs Assistance Bed Mobility: Supine to Sit     Supine to sit: Supervision;HOB elevated     General bed mobility comments: mild increased effort to perform on own    Transfers Overall transfer level: Needs assistance Equipment used: Rolling walker (2 wheeled) Transfers: Sit to/from Stand Sit to Stand: Min guard         General transfer comment: x2 trials from bed up to RW  Ambulation/Gait Ambulation/Gait assistance: Min  guard Gait Distance (Feet):  (20 feet x2) Assistive device: Rolling walker (2 wheeled)   Gait velocity: decreased   General Gait Details: decreased B LE step length/foot clearance/heelstrike  Stairs            Wheelchair Mobility    Modified Rankin (Stroke Patients Only)       Balance Overall balance assessment: Needs assistance Sitting-balance support: No upper extremity supported;Feet supported Sitting balance-Leahy Scale: Good Sitting balance - Comments: steady sitting reaching within BOS   Standing balance support: Single extremity supported Standing balance-Leahy Scale: Fair Standing balance comment: steady standing with at least single UE support                             Pertinent Vitals/Pain Pain Assessment: No/denies pain  HR WFL during sessions activities.    Home Living Family/patient expects to be discharged to:: Private residence Living Arrangements: Spouse/significant other Available Help at Discharge: Family Type of Home: House Home Access: Stairs to enter Entrance Stairs-Rails: Right Entrance Stairs-Number of Steps: 3 Home Layout: One level Home Equipment: Grab bars - toilet;Grab bars - tub/shower      Prior Function Level of Independence: Independent         Comments: Pt reports no recent falls.     Hand Dominance        Extremity/Trunk Assessment   Upper Extremity Assessment Upper Extremity Assessment: Generalized weakness    Lower Extremity Assessment Lower Extremity Assessment: Generalized weakness    Cervical / Trunk Assessment Cervical / Trunk Assessment: Normal  Communication   Communication: No difficulties  Cognition Arousal/Alertness: Awake/alert Behavior During Therapy: Anxious Overall  Cognitive Status: Within Functional Limits for tasks assessed                                        General Comments   Nursing cleared pt for participation in physical therapy.  Pt agreeable to  PT session.    Exercises     Assessment/Plan    PT Assessment Patient needs continued PT services  PT Problem List Decreased strength;Decreased activity tolerance;Decreased balance;Decreased mobility;Decreased knowledge of use of DME;Decreased knowledge of precautions;Cardiopulmonary status limiting activity       PT Treatment Interventions DME instruction;Gait training;Stair training;Functional mobility training;Therapeutic activities;Therapeutic exercise;Balance training;Patient/family education    PT Goals (Current goals can be found in the Care Plan section)  Acute Rehab PT Goals Patient Stated Goal: to go home PT Goal Formulation: With patient Time For Goal Achievement: 01/02/21 Potential to Achieve Goals: Good    Frequency Min 2X/week   Barriers to discharge        Co-evaluation               AM-PAC PT "6 Clicks" Mobility  Outcome Measure Help needed turning from your back to your side while in a flat bed without using bedrails?: None Help needed moving from lying on your back to sitting on the side of a flat bed without using bedrails?: A Little Help needed moving to and from a bed to a chair (including a wheelchair)?: A Little Help needed standing up from a chair using your arms (e.g., wheelchair or bedside chair)?: A Little Help needed to walk in hospital room?: A Little Help needed climbing 3-5 steps with a railing? : A Little 6 Click Score: 19    End of Session Equipment Utilized During Treatment: Gait belt;Oxygen (4 L O2 via nasal cannula) Activity Tolerance: Patient limited by fatigue Patient left: in chair;with call bell/phone within reach;with chair alarm set Nurse Communication: Mobility status;Precautions;Other (comment) (pt's O2 sats during session) PT Visit Diagnosis: Unsteadiness on feet (R26.81);Other abnormalities of gait and mobility (R26.89);Muscle weakness (generalized) (M62.81);Difficulty in walking, not elsewhere classified (R26.2)     Time: 1497-0263 PT Time Calculation (min) (ACUTE ONLY): 30 min   Charges:   PT Evaluation $PT Eval Low Complexity: 1 Low PT Treatments $Therapeutic Activity: 8-22 mins       Hendricks Limes, PT 12/19/20, 4:40 PM

## 2020-12-19 NOTE — Progress Notes (Addendum)
PROGRESS NOTE    Brandi [name]   KNL:[id number]  DOB: [birth year]  PCP: [name], DO    DOA: 12/16/2020 LOS: 3   Brief Narrative   65 year old female with history of prior tobacco use, unvaccinated, comes to the hospital with weakness, cough, shortness of breath.  She apparently had URI type symptoms in the middle of December and received an antibiotic as an outpatient.  About a week ago on 1/4 both she and her husband began feeling weak, body aches, fevers along with cough and shortness of breath.  Daughter also tells that patient was being a little bit confused.  She was diagnosed with COVID-19, chest x-ray showed multifocal pneumonia and was admitted to the hospital.     Assessment & Plan   Active Problems:   Acute hypoxemic respiratory failure due to COVID-19 [facility])   Acute respiratory failure with hypoxia secondary to COVID-19 multifocal pneumonia -present on admission with shortness of breath and requirement for 2 L/min supplemental oxygen to maintain O2 saturation. CRP has normalized from 1.3>> 0.7>> 0.5 1/13: Oxygen requirement has increased to 6-8 L/min -- Continue remdesivir, steroids -- Consider baricitinib depending on her oxygen requirement or rising inflammatory markers -- Incentive spirometry, flutter valve, proning as tolerated, up in chair, ambulate -- Supplemental oxygen as needed to maintain O2 sat greater than 88%, wean as tolerated   Generalized weakness -due to acute illness.  PT evaluation.  Up to chair at least daily.  Indigestion -started on Pepcid, continue.  Maalox as needed.  Anxiety -situational due to acute illness and hospitalization.  Trial of low-dose Xanax as needed.  Loose stools -Imodium as needed.  No fevers or leukocytosis, no recent antibiotics, very unlikely C. difficile  Hyponatremia -POA, likely due to dehydration.  Sodium improved after IV hydration.  Monitor.  Elevated LFTs -resolved.  POA, mild, likely due to COVID  infection.  Monitor CMP.  Thrombocytopenia -POA, resolved, likely due to COVID infection  Leukopenia -POA due to COVID.  Monitor CBC  Hyperlipidemia -continue statin     DVT prophylaxis: enoxaparin (LOVENOX) injection 40 mg Start: 12/16/20 2200   Diet:  Diet Orders (From admission, onward)    Start     Ordered   12/16/20 1444  Diet regular Room service appropriate? Yes; Fluid consistency: Thin  Diet effective now       Question Answer Comment  Room service appropriate? Yes   Fluid consistency: Thin      12/16/20 1443            Code Status: Full Code    Subjective 12/19/20    Patient seen at bedside today.  Husband is getting discharged home today.  Nursing ambulated patient this morning and she required up to 8 L/min oxygen with exertion.  Patient reports some anxiety in her breathing gets worse when she is anxious, better if she is distracted by TV or other things.  Still having indigestion and belching.  Reported multiple loose stools this morning.  No fevers or chills or chest pain, no nausea or vomiting or other acute complaints.   Disposition Plan & Communication   Status is: Inpatient  Remains inpatient appropriate because:Inpatient level of care appropriate due to severity of illness.  Requiring IV therapies and oxygen as above for COVID-19 infection   Dispo: The patient is from: Home              Anticipated d/c is to: Home  Anticipated d/c date is: 2 days              Patient currently is not medically stable to d/c.   Family Communication: Husband present on rounds   Consults, Procedures, Significant Events   Consultants:   None  Procedures:   None  Antimicrobials:  Anti-infectives (From admission, onward)   Start     Dose/Rate Route Frequency Ordered Stop   12/17/20 1000  remdesivir 100 mg in sodium chloride 0.9 % 100 mL IVPB       "Followed by" Linked Group Details   100 mg 200 mL/hr over 30 Minutes Intravenous Daily 12/16/20  1443 12/21/20 0959   12/16/20 1530  remdesivir 200 mg in sodium chloride 0.9% 250 mL IVPB       "Followed by" Linked Group Details   200 mg 580 mL/hr over 30 Minutes Intravenous Once 12/16/20 1443 12/16/20 1926        Objective   Vitals:   12/19/20 0903 12/19/20 0904 12/19/20 1127 12/19/20 1556  BP:   128/80 127/75  Pulse: (!) 102 94 85 85  Resp:   18 18  Temp:   98.2 F (36.8 C) 97.9 F (36.6 C)  TempSrc:      SpO2: 93% 91% 98% 100%  Weight:      Height:       No intake or output data in the 24 hours ending 12/19/20 1609 Filed Weights   12/16/20 0736 12/16/20 2116  Weight: 54.4 kg 54.1 kg    Physical Exam:  General exam: awake, alert, no acute distress, mildly ill appearing Respiratory system: Symmetric chest rise, normal respiratory effort at rest, on 6 L/min nasal cannula oxygen Cardiovascular system: normal S1/S2, RRR, no pedal edema.   Gastrointestinal system: soft, NT, ND Central nervous system: A&O x3. no gross focal neurologic deficits, normal speech Extremities: moves all, no edema, normal tone Psychiatry: anxious mood, congruent affect, judgement and insight appear normal  Labs   Data Reviewed: I have personally reviewed following labs and imaging studies  CBC: Recent Labs  Lab 12/16/20 0741 12/17/20 0400 12/18/20 0614 12/19/20 0605  WBC 3.2* 2.8* 4.1 4.9  NEUTROABS  --  1.4* 2.5 3.3  HGB 15.2* 13.7 13.2 13.6  HCT 43.5 40.1 38.6 39.8  MCV 92.2 92.6 91.5 92.3  PLT 144* 154 188 236   Basic Metabolic Panel: Recent Labs  Lab 12/16/20 0741 12/17/20 0400 12/18/20 0614 12/19/20 0605  NA 133* 137 137 140  K 3.8 4.3 3.9 4.0  CL 93* 97* 98 99  CO2 28 30 29  32  GLUCOSE 114* 117* 140* 156*  BUN 15 13 17 20   CREATININE 0.52 0.40* 0.52 0.54  CALCIUM 9.0 8.8* 8.6* 8.7*  MG  --  2.0 2.1 2.4  PHOS  --  3.7 3.8 3.7   GFR: Estimated Creatinine Clearance: 59.9 mL/min (by C-G formula based on SCr of 0.54 mg/dL). Liver Function Tests: Recent Labs   Lab 12/16/20 0741 12/17/20 0400 12/18/20 0614 12/19/20 0605  AST 52* 39 33 33  ALT 25 21 22 24   ALKPHOS 51 42 43 40  BILITOT 0.8 0.5 0.5 0.5  PROT 7.3 6.1* 5.7* 5.9*  ALBUMIN 4.0 3.3* 3.1* 3.2*   Recent Labs  Lab 12/16/20 0741  LIPASE 37   No results for input(s): AMMONIA in the last 168 hours. Coagulation Profile: No results for input(s): INR, PROTIME in the last 168 hours. Cardiac Enzymes: No results for input(s): CKTOTAL, CKMB, CKMBINDEX, TROPONINI in the  last 168 hours. BNP (last 3 results) No results for input(s): PROBNP in the last 8760 hours. HbA1C: No results for input(s): HGBA1C in the last 72 hours. CBG: Recent Labs  Lab 12/16/20 0741  GLUCAP 105*   Lipid Profile: No results for input(s): CHOL, HDL, LDLCALC, TRIG, CHOLHDL, LDLDIRECT in the last 72 hours. Thyroid Function Tests: No results for input(s): TSH, T4TOTAL, FREET4, T3FREE, THYROIDAB in the last 72 hours. Anemia Panel: Recent Labs    12/18/20 0614 12/19/20 0605  FERRITIN 443* 471*   Sepsis Labs: No results for input(s): PROCALCITON, LATICACIDVEN in the last 168 hours.  No results found for this or any previous visit (from the past 240 hour(s)).    Imaging Studies   No results found.   Medications   Scheduled Meds: . aspirin EC  81 mg Oral Daily  . enoxaparin (LOVENOX) injection  40 mg Subcutaneous Q24H  . ezetimibe  10 mg Oral Daily  . famotidine  20 mg Oral Daily  . feeding supplement  237 mL Oral BID BM  . magic mouthwash  10 mL Oral QID  . methylPREDNISolone (SOLU-MEDROL) injection  30 mg Intravenous Q12H  . multivitamin with minerals  1 tablet Oral Daily  . rosuvastatin  20 mg Oral Daily  . sodium chloride flush  3 mL Intravenous Q12H   Continuous Infusions: . sodium chloride 250 mL (12/17/20 0939)  . remdesivir 100 mg in NS 100 mL 100 mg (12/19/20 0935)       LOS: 3 days    Time spent: 25 minutes with > 50% spent in coordination of care and direct patient  contact.    [name], DO [facility]  12/19/2020, 4:09 PM    If 7PM-7AM, please contact night-coverage. How to contact the [facility] Attending or Consulting provider 7A - 7P or covering provider during after hours 7P -7A, for this patient?    1. Check the care team in [facility] and look for a) attending/consulting TRH provider listed and b) the [facility] team listed 2. Log into [email or web address] and use [place]'s universal password to access. If you do not have the password, please contact the hospital operator. 3. Locate the [facility] provider you are looking for under [facility] and page to a number that you can be directly reached. 4. If you still have difficulty reaching the provider, please page the [facility] (Director on Call) for the Hospitalists listed on amion for assistance.

## 2020-12-20 DIAGNOSIS — U071 COVID-19: Secondary | ICD-10-CM | POA: Diagnosis not present

## 2020-12-20 DIAGNOSIS — J9601 Acute respiratory failure with hypoxia: Secondary | ICD-10-CM | POA: Diagnosis not present

## 2020-12-20 LAB — COMPREHENSIVE METABOLIC PANEL
ALT: 26 U/L (ref 0–44)
AST: 26 U/L (ref 15–41)
Albumin: 3.5 g/dL (ref 3.5–5.0)
Alkaline Phosphatase: 45 U/L (ref 38–126)
Anion gap: 8 (ref 5–15)
BUN: 21 mg/dL (ref 8–23)
CO2: 32 mmol/L (ref 22–32)
Calcium: 8.8 mg/dL — ABNORMAL LOW (ref 8.9–10.3)
Chloride: 98 mmol/L (ref 98–111)
Creatinine, Ser: 0.44 mg/dL (ref 0.44–1.00)
GFR, Estimated: >60 mL/min (ref >60)
Glucose, Bld: 187 mg/dL — ABNORMAL HIGH (ref 70–99)
Potassium: 4 mmol/L (ref 3.5–5.1)
Sodium: 138 mmol/L (ref 135–145)
Total Bilirubin: 0.7 mg/dL (ref 0.3–1.2)
Total Protein: 6.6 g/dL (ref 6.5–8.1)

## 2020-12-20 LAB — CBC WITH DIFFERENTIAL/PLATELET
Abs Immature Granulocytes: 0.04 10*3/uL (ref 0.00–0.07)
Basophils Absolute: 0 10*3/uL (ref 0.0–0.1)
Basophils Relative: 0 %
Eosinophils Absolute: 0 10*3/uL (ref 0.0–0.5)
Eosinophils Relative: 0 %
HCT: 44.4 % (ref 36.0–46.0)
Hemoglobin: 14.7 g/dL (ref 12.0–15.0)
Immature Granulocytes: 1 %
Lymphocytes Relative: 20 %
Lymphs Abs: 1 10*3/uL (ref 0.7–4.0)
MCH: 31.5 pg (ref 26.0–34.0)
MCHC: 33.1 g/dL (ref 30.0–36.0)
MCV: 95.3 fL (ref 80.0–100.0)
Monocytes Absolute: 0.3 10*3/uL (ref 0.1–1.0)
Monocytes Relative: 6 %
Neutro Abs: 3.6 10*3/uL (ref 1.7–7.7)
Neutrophils Relative %: 73 %
Platelets: 270 10*3/uL (ref 150–400)
RBC: 4.66 MIL/uL (ref 3.87–5.11)
RDW: 12.3 % (ref 11.5–15.5)
Smear Review: NORMAL
WBC: 4.9 10*3/uL (ref 4.0–10.5)
nRBC: 0 % (ref 0.0–0.2)

## 2020-12-20 LAB — FERRITIN: Ferritin: 439 ng/mL — ABNORMAL HIGH (ref 11–307)

## 2020-12-20 LAB — FIBRIN DERIVATIVES D-DIMER (ARMC ONLY): Fibrin derivatives D-dimer (ARMC): 283.77 ng/mL (FEU) (ref 0.00–499.00)

## 2020-12-20 LAB — C-REACTIVE PROTEIN: CRP: <0.5 mg/dL (ref <1.0)

## 2020-12-20 LAB — PHOSPHORUS: Phosphorus: 3.2 mg/dL (ref 2.5–4.6)

## 2020-12-20 LAB — MAGNESIUM: Magnesium: 2.4 mg/dL (ref 1.7–2.4)

## 2020-12-20 NOTE — Progress Notes (Addendum)
PROGRESS NOTE    Brandi [name]   WGN:[id number]  DOB: June 18, 1956  PCP: [name], DO    DOA: 12/16/2020 LOS: 4   Brief Narrative   65 year old female with history of prior tobacco use, unvaccinated, comes to the hospital with weakness, cough, shortness of breath.  She apparently had URI type symptoms in the middle of December and received an antibiotic as an outpatient.  About a week ago on 1/4 both she and her husband began feeling weak, body aches, fevers along with cough and shortness of breath.  Daughter also tells that patient was being a little bit confused.  She was diagnosed with COVID-19, chest x-ray showed multifocal pneumonia and was admitted to the hospital.     Assessment & Plan   Active Problems:   Acute hypoxemic respiratory failure due to COVID-19 [facility])   Acute respiratory failure with hypoxia secondary to COVID-19 multifocal pneumonia -present on admission with shortness of breath and requirement for 2 L/min supplemental oxygen to maintain O2 saturation. CRP has normalized from 1.3>> 0.7>> 0.5 1/14: Oxygen requirement slightly improved from 6 to 8 L yesterday to 4 L today.  Still very dyspneic on exertion and generally very weak. -- Continue remdesivir, steroids -- Consider baricitinib depending on her oxygen requirement or rising inflammatory markers -- Incentive spirometry, flutter valve, proning as tolerated, up in chair, ambulate -- Supplemental oxygen as needed to maintain O2 sat greater than 88%, wean as tolerated   Generalized weakness -due to acute illness.  PT and OT recommend home health.   Up to chair at least daily.  Ongoing PT OT while here is much as possible.  Indigestion -started on Pepcid, continue.  Maalox as needed.  Oral thrush - magic mouthwash / nystatin  Anxiety -situational due to acute illness and hospitalization.  Trial of low-dose Xanax as needed.  Loose stools -Imodium as needed.  No fevers or leukocytosis, no  recent antibiotics, very unlikely C. difficile  Hyponatremia -POA, likely due to dehydration.  Sodium improved after IV hydration.  Monitor.  Elevated LFTs -resolved.  POA, mild, likely due to COVID infection.  Monitor CMP.  Thrombocytopenia -POA, resolved, likely due to COVID infection  Leukopenia -POA due to COVID.  Monitor CBC  Hyperlipidemia -continue statin     DVT prophylaxis: enoxaparin (LOVENOX) injection 40 mg Start: 12/16/20 2200   Diet:  Diet Orders (From admission, onward)    Start     Ordered   12/16/20 1444  Diet regular Room service appropriate? Yes; Fluid consistency: Thin  Diet effective now       Question Answer Comment  Room service appropriate? Yes   Fluid consistency: Thin      12/16/20 1443            Code Status: Full Code    Subjective 12/20/20    Patient seen up in chair today.  She reports feeling little better.  Xanax has helped her anxiety somewhat, along with keeping her mind distracted by television or reading.  Reports her husband is back in the ER was too weak at home could not care for himself.  She denies any other acute complaints.   Disposition Plan & Communication   Status is: Inpatient  Remains inpatient appropriate because:Inpatient level of care appropriate due to severity of illness.  Requiring IV therapies and oxygen as above for COVID-19 infection.   Dispo: The patient is from: Home              Anticipated  d/c is to: Home              Anticipated d/c date is: 2-3 days              Patient currently is not medically stable to d/c.   Family Communication: spoke with patient's daughter Selena Batten this afternoon by phone.   Consults, Procedures, Significant Events   Consultants:   None  Procedures:   None  Antimicrobials:  Anti-infectives (From admission, onward)   Start     Dose/Rate Route Frequency Ordered Stop   12/17/20 1000  remdesivir 100 mg in sodium chloride 0.9 % 100 mL IVPB       "Followed by"  Linked Group Details   100 mg 200 mL/hr over 30 Minutes Intravenous Daily 12/16/20 1443 12/20/20 0917   12/16/20 1530  remdesivir 200 mg in sodium chloride 0.9% 250 mL IVPB       "Followed by" Linked Group Details   200 mg 580 mL/hr over 30 Minutes Intravenous Once 12/16/20 1443 12/16/20 1926        Objective   Vitals:   12/19/20 2300 12/20/20 0422 12/20/20 0818 12/20/20 1129  BP: 115/84 108/73 98/81 99/62   Pulse: 77 91 90 88  Resp: 18 18 18 16   Temp: (!) 97.4 F (36.3 C) 98.8 F (37.1 C) 97.7 F (36.5 C) 98 F (36.7 C)  TempSrc: Oral     SpO2: 99% 90% 95% 96%  Weight:      Height:        Intake/Output Summary (Last 24 hours) at 12/20/2020 1503 Last data filed at 12/19/2020 1700 Gross per 24 hour  Intake --  Output 250 ml  Net -250 ml   Filed Weights   12/16/20 0736 12/16/20 2116  Weight: 54.4 kg 54.1 kg    Physical Exam:  General exam: awake, alert, NAD Respiratory system: normal respiratory effort, symmetric chest rise, on 4 L/min nasal cannula oxygen Cardiovascular system: normal S1/S2, RRR, no pedal edema.   Central nervous system: A&O x3. no gross focal neurologic deficits, normal speech Psychiatry: normal mood, congruent affect, judgement and insight appear normal  Labs   Data Reviewed: I have personally reviewed following labs and imaging studies  CBC: Recent Labs  Lab 12/16/20 0741 12/17/20 0400 12/18/20 0614 12/19/20 0605 12/20/20 0437  WBC 3.2* 2.8* 4.1 4.9 4.9  NEUTROABS  --  1.4* 2.5 3.3 3.6  HGB 15.2* 13.7 13.2 13.6 14.7  HCT 43.5 40.1 38.6 39.8 44.4  MCV 92.2 92.6 91.5 92.3 95.3  PLT 144* 154 188 236 270   Basic Metabolic Panel: Recent Labs  Lab 12/16/20 0741 12/17/20 0400 12/18/20 0614 12/19/20 0605 12/20/20 0437  NA 133* 137 137 140 138  K 3.8 4.3 3.9 4.0 4.0  CL 93* 97* 98 99 98  CO2 28 30 29  32 32  GLUCOSE 114* 117* 140* 156* 187*  BUN 15 13 17 20 21   CREATININE 0.52 0.40* 0.52 0.54 0.44  CALCIUM 9.0 8.8* 8.6* 8.7* 8.8*   MG  --  2.0 2.1 2.4 2.4  PHOS  --  3.7 3.8 3.7 3.2   GFR: Estimated Creatinine Clearance: 59.9 mL/min (by C-G formula based on SCr of 0.44 mg/dL). Liver Function Tests: Recent Labs  Lab 12/16/20 0741 12/17/20 0400 12/18/20 0614 12/19/20 0605 12/20/20 0437  AST 52* 39 33 33 26  ALT 25 21 22 24 26   ALKPHOS 51 42 43 40 45  BILITOT 0.8 0.5 0.5 0.5 0.7  PROT 7.3 6.1*  5.7* 5.9* 6.6  ALBUMIN 4.0 3.3* 3.1* 3.2* 3.5   Recent Labs  Lab 12/16/20 0741  LIPASE 37   No results for input(s): AMMONIA in the last 168 hours. Coagulation Profile: No results for input(s): INR, PROTIME in the last 168 hours. Cardiac Enzymes: No results for input(s): CKTOTAL, CKMB, CKMBINDEX, TROPONINI in the last 168 hours. BNP (last 3 results) No results for input(s): PROBNP in the last 8760 hours. HbA1C: No results for input(s): HGBA1C in the last 72 hours. CBG: Recent Labs  Lab 12/16/20 0741  GLUCAP 105*   Lipid Profile: No results for input(s): CHOL, HDL, LDLCALC, TRIG, CHOLHDL, LDLDIRECT in the last 72 hours. Thyroid Function Tests: No results for input(s): TSH, T4TOTAL, FREET4, T3FREE, THYROIDAB in the last 72 hours. Anemia Panel: Recent Labs    12/19/20 0605 12/20/20 0437  FERRITIN 471* 439*   Sepsis Labs: No results for input(s): PROCALCITON, LATICACIDVEN in the last 168 hours.  No results found for this or any previous visit (from the past 240 hour(s)).    Imaging Studies   No results found.   Medications   Scheduled Meds: . aspirin EC  81 mg Oral Daily  . enoxaparin (LOVENOX) injection  40 mg Subcutaneous Q24H  . ezetimibe  10 mg Oral Daily  . famotidine  20 mg Oral Daily  . feeding supplement  237 mL Oral BID BM  . magic mouthwash  10 mL Oral QID  . methylPREDNISolone (SOLU-MEDROL) injection  30 mg Intravenous Q12H  . multivitamin with minerals  1 tablet Oral Daily  . rosuvastatin  20 mg Oral Daily  . sodium chloride flush  3 mL Intravenous Q12H   Continuous  Infusions: . sodium chloride 250 mL (12/20/20 0845)       LOS: 4 days    Time spent: 25 minutes with > 50% spent in coordination of care and direct patient contact.    [name], DO [facility]  12/20/2020, 3:03 PM    If 7PM-7AM, please contact night-coverage. How to contact the [facility] Attending or Consulting provider 7A - 7P or covering provider during after hours 7P -7A, for this patient?    1. Check the care team in [facility] and look for a) attending/consulting TRH provider listed and b) the [facility] team listed 2. Log into [email or web address] and use [place]'s universal password to access. If you do not have the password, please contact the hospital operator. 3. Locate the [facility] provider you are looking for under [facility] and page to a number that you can be directly reached. 4. If you still have difficulty reaching the provider, please page the [facility] (Director on Call) for the Hospitalists listed on amion for assistance.

## 2020-12-20 NOTE — Evaluation (Signed)
Occupational Therapy Evaluation Patient Details Name: Brandi [name] MRN: [id number] DOB: Jun 06, 1956 Today's Date: 12/20/2020    History of Present Illness Pt is a 65 y.o. female presenting to hospital 1/10 with generalized weakness, fatigue, abdominal pain, melena, decreased appetitie, and presyncopal lightheadedness.  Pt admitted with acute hypoxemic respiratory failure secondary to COVID-19 multifocal PNA, hyponatremia, transaminitis, thrombocytopenia.  PMH includes sinusitis, centrilobar emphysema, L shoulder closed reduction and shoulder arthroscopy.   Clinical Impression   Pt was seen for OT evaluation this date. Prior to hospital admission, pt was independent, living with her spouse (who was recently discharged from admission for Covid-19). Currently pt demonstrates impairments in strength and activity tolerance as described below (See OT problem list) which functionally limit her ability to perform ADL/self-care tasks. Pt currently requires SBA to CGA and additional time/effort to perform ADL tasks and ADL mobility. Pt instructed in IS and flutter valve use and able to demonstrate with additional instruction. Pt instructed in ECS to support safety/indep with return to PLOF. Pt verbalized understanding. Pt would benefit from skilled OT services to address noted impairments and functional limitations (see below for any additional details) in order to maximize safety and independence while minimizing falls risk and caregiver burden. Upon hospital discharge, recommend HHOT to maximize pt safety and return to functional independence during meaningful occupations of daily life.     Follow Up Recommendations  Home health OT    Equipment Recommendations  None recommended by OT    Recommendations for Other Services       Precautions / Restrictions Precautions Precautions: Fall Restrictions Weight Bearing Restrictions: No      Mobility Bed Mobility Overal bed mobility: Needs  Assistance Bed Mobility: Supine to Sit     Supine to sit: Supervision;HOB elevated          Transfers Overall transfer level: Needs assistance Equipment used: Rolling walker (2 wheeled) Transfers: Sit to/from Stand Sit to Stand: Supervision              Balance Overall balance assessment: Mild deficits observed, not formally tested                                         ADL either performed or assessed with clinical judgement   ADL Overall ADL's : Needs assistance/impaired                                       General ADL Comments: supervision to CGA for ADL transfers with RW, increased effort to perform     Vision Patient Visual Report: No change from baseline       Perception     Praxis      Pertinent Vitals/Pain Pain Assessment: No/denies pain     Hand Dominance     Extremity/Trunk Assessment Upper Extremity Assessment Upper Extremity Assessment: Generalized weakness   Lower Extremity Assessment Lower Extremity Assessment: Generalized weakness   Cervical / Trunk Assessment Cervical / Trunk Assessment: Normal   Communication Communication Communication: No difficulties   Cognition Arousal/Alertness: Awake/alert Behavior During Therapy: WFL for tasks assessed/performed Overall Cognitive Status: Within Functional Limits for tasks assessed  General Comments       Exercises Other Exercises Other Exercises: Pt instructed in IS and flutter valve use, ECS   Shoulder Instructions      Home Living Family/patient expects to be discharged to:: Private residence Living Arrangements: Spouse/significant other Available Help at Discharge: Family Type of Home: House Home Access: Stairs to enter Secretary/administrator of Steps: 3 Entrance Stairs-Rails: Right Home Layout: One level     Bathroom Shower/Tub: Producer, television/film/video: Handicapped height      Home Equipment: Grab bars - toilet;Grab bars - tub/shower          Prior Functioning/Environment Level of Independence: Independent        Comments: Pt reports no recent falls.        OT Problem List: Decreased activity tolerance;Impaired balance (sitting and/or standing);Decreased knowledge of use of DME or AE;Decreased strength;Cardiopulmonary status limiting activity      OT Treatment/Interventions: Self-care/ADL training;Therapeutic exercise;Therapeutic activities;Energy conservation;DME and/or AE instruction;Patient/family education;Balance training    OT Goals(Current goals can be found in the care plan section) Acute Rehab OT Goals Patient Stated Goal: to go home OT Goal Formulation: With patient Time For Goal Achievement: 01/03/21 Potential to Achieve Goals: Good ADL Goals Pt Will Transfer to Toilet: with modified independence;ambulating (LRAD) Additional ADL Goal #1: Pt will verbalize plan to implement at least 2 learned energy conservation strategies.  OT Frequency: Min 2X/week   Barriers to D/C: Decreased caregiver support          Co-evaluation              AM-PAC OT "6 Clicks" Daily Activity     Outcome Measure Help from another person eating meals?: None Help from another person taking care of personal grooming?: None Help from another person toileting, which includes using toliet, bedpan, or urinal?: A Little Help from another person bathing (including washing, rinsing, drying)?: A Little Help from another person to put on and taking off regular upper body clothing?: None Help from another person to put on and taking off regular lower body clothing?: A Little 6 Click Score: 21   End of Session    Activity Tolerance: Patient tolerated treatment well Patient left: in chair;with call bell/phone within reach;with chair alarm set  OT Visit Diagnosis: Other abnormalities of gait and mobility (R26.89);Muscle weakness (generalized) (M62.81)                 Time: 4734-0370 OT Time Calculation (min): 26 min Charges:  OT General Charges $OT Visit: 1 Visit OT Evaluation $OT Eval Low Complexity: 1 Low OT Treatments $Self Care/Home Management : 8-22 mins $Therapeutic Activity: 8-22 mins  Richrd Prime, MPH, MS, OTR/L ascom [phone] 12/20/20, 12:45 PM

## 2020-12-20 NOTE — Care Management Important Message (Signed)
Important Message  Patient Details  Name: Brandi [name] MRN: [id number] Date of Birth: 1956-08-24   Medicare Important Message Given:  Yes     Allayne Butcher, RN 12/20/2020, 11:11 AM

## 2020-12-20 NOTE — TOC Initial Note (Signed)
Transition of Care [facility]) - Initial/Assessment Note    Patient Details  Name: Brandi [name] MRN: [id number] Date of Birth: 1956-11-12  Transition [facility] CM/SW Contact:    Allayne Butcher, RN Phone Number: 12/20/2020, 4:12 PM  Clinical Narrative:                 Patient admitted to the hospital with COVID requiring supplemental oxygen.  RNCM spoke with patient at the bedside.  Patient is from home with her husband who also has COVID and was discharged yesterday but apparently is coming back to the hospital today.  Patient is independent at home but will need a walker and 3 in 1 at discharge.  Patient will also need home health services.  Kandee Keen with [facility] has accepted referral for RN, PT, and OT.  TOC will cont to follow for additional needs like home O2.    Expected Discharge Plan: Home w Home Health Services Barriers to Discharge: Continued Medical Work up   Patient Goals and CMS Choice Patient states their goals for this hospitalization and ongoing recovery are:: Patient does not feel well enough to go home yet but agrees to home health services. CMS Medicare.gov Compare Post Acute Care list provided to:: Patient Choice offered to / list presented to : Patient  Expected Discharge Plan and Services Expected Discharge Plan: Home w Home Health Services   Discharge Planning Services: CM Consult Post Acute Care Choice: Home Health Living arrangements for the past 2 months: Single Family Home                 DME Arranged: 3-N-1 DME Agency: [employer]       HH Arranged: RN,PT,OT HH Agency: [facility] Date [facility] Agency Contacted: 12/20/20 Time HH Agency Contacted: [phone] Representative spoke with at [facility] Agency: K[facility] Keen  Prior Living Arrangements/Services Living arrangements for the past 2 months: Single Family Home Lives with:: Spouse Patient language and need for interpreter reviewed:: Yes Do you feel safe going back to the place where you live?: Yes      Need for  Family Participation in Patient Care: Yes (Comment) (COVID) Care giver support system in place?: Yes (comment) (husband and daughter)   Criminal Activity/Legal Involvement Pertinent to Current Situation/Hospitalization: No - Comment as needed  Activities of Daily Living Home Assistive Devices/Equipment: None ADL Screening (condition at time of admission) Patient's cognitive ability adequate to safely complete daily activities?: Yes Is the patient deaf or have difficulty hearing?: No Does the patient have difficulty seeing, even when wearing glasses/contacts?: No Does the patient have difficulty concentrating, remembering, or making decisions?: No Patient able to express need for assistance with ADLs?: Yes Does the patient have difficulty dressing or bathing?: No Independently performs ADLs?: Yes (appropriate for developmental age) Does the patient have difficulty walking or climbing stairs?: No Weakness of Legs: Both Weakness of Arms/Hands: None  Permission Sought/Granted Permission sought to share information with : Case Manager,Family Supports,Other (comment) Permission granted to share information with : Yes, Verbal Permission Granted  Share Information with NAME: Sharl Ma  Permission granted to share info w AGENCY: [facility]  Permission granted to share info w Relationship: husband     Emotional Assessment Appearance:: Appears stated age Attitude/Demeanor/Rapport: Engaged Affect (typically observed): Accepting Orientation: : Oriented to Self,Oriented to Place,Oriented to  Time,Oriented to Situation Alcohol / Substance Use: Not Applicable Psych Involvement: No (comment)  Admission diagnosis:  Dehydration [E86.0] Hypoxemia [R09.02] Generalized weakness [R53.1] Acute hypoxemic respiratory failure due to COVID-19 (  HCC) [U07.1, J96.01] COVID-19 [U07.1] Patient Active Problem List   Diagnosis Date Noted  . Acute hypoxemic respiratory failure due to COVID-19 (HCC) 12/16/2020  .  COVID-19 vaccination declined 12/04/2020  . Sinusitis 11/19/2020  . Centrilobular emphysema (HCC) 04/08/2020  . Nodule of upper lobe of left lung 04/08/2020  . Aortic atherosclerosis (HCC) 05/21/2019  . Coronary artery calcification seen on CT scan 05/21/2019  . Former smoker 05/21/2019  . Hyperlipidemia 05/21/2019   PCP:  [name], DO Pharmacy:   [facility], [place] - [address] [place] [address] Phone: [phone] Fax: [phone]     Social Determinants of Health (SDOH) Interventions    Readmission Risk Interventions No flowsheet data found.

## 2020-12-20 NOTE — Progress Notes (Signed)
Physical Therapy Treatment Patient Details Name: Brandi [name] MRN: [id number] DOB: 1956-08-07 Today's Date: 12/20/2020    History of Present Illness Pt is a 65 y.o. female presenting to hospital 1/10 with generalized weakness, fatigue, abdominal pain, melena, decreased appetitie, and presyncopal lightheadedness.  Pt admitted with acute hypoxemic respiratory failure secondary to COVID-19 multifocal PNA, hyponatremia, transaminitis, thrombocytopenia.  PMH includes sinusitis, centrilobar emphysema, L shoulder closed reduction and shoulder arthroscopy.    PT Comments    Pt resting in recliner upon PT arrival; agreeable to PT session.  Pt able to ambulate 45 feet x2 with RW CGA to SBA; O2 sats 90% or greater on 4 L O2 via nasal cannula during sessions activities.  Limited distance ambulating d/t fatigue.  Will continue to focus on strengthening, increasing activity tolerance/endurance, and progressive functional mobility per pt tolerance.    Follow Up Recommendations  Home health PT     Equipment Recommendations  Rolling walker with 5" wheels;3in1 (PT)    Recommendations for Other Services OT consult     Precautions / Restrictions Precautions Precautions: Fall Restrictions Weight Bearing Restrictions: No    Mobility  Bed Mobility               General bed mobility comments: Deferred (pt sitting in recliner beginning/end of session)  Transfers Overall transfer level: Needs assistance Equipment used: Rolling walker (2 wheeled) Transfers: Sit to/from Stand Sit to Stand: Supervision         General transfer comment: x2 trials from recliner up to RW  Ambulation/Gait Ambulation/Gait assistance: Min guard;Supervision Gait Distance (Feet):  (45 feet x2) Assistive device: Rolling walker (2 wheeled)   Gait velocity: decreased   General Gait Details: decreased B LE step length/foot clearance/heelstrike   Stairs             Wheelchair Mobility    Modified  Rankin (Stroke Patients Only)       Balance Overall balance assessment: Needs assistance Sitting-balance support: No upper extremity supported;Feet supported Sitting balance-Leahy Scale: Good Sitting balance - Comments: steady sitting reaching within BOS   Standing balance support: Single extremity supported Standing balance-Leahy Scale: Fair Standing balance comment: steady standing with at least single UE support                            Cognition Arousal/Alertness: Awake/alert Behavior During Therapy: Anxious Overall Cognitive Status: Within Functional Limits for tasks assessed                                        Exercises      General Comments   Nursing cleared pt for participation in physical therapy.  Pt agreeable to PT session.      Pertinent Vitals/Pain Pain Assessment: No/denies pain  HR WFL during sessions activities.    Home Living                      Prior Function            PT Goals (current goals can now be found in the care plan section) Acute Rehab PT Goals Patient Stated Goal: to go home PT Goal Formulation: With patient Time For Goal Achievement: 01/02/21 Potential to Achieve Goals: Good Progress towards PT goals: Progressing toward goals    Frequency    Min 2X/week      PT  Plan Current plan remains appropriate    Co-evaluation              AM-PAC PT "6 Clicks" Mobility   Outcome Measure  Help needed turning from your back to your side while in a flat bed without using bedrails?: None Help needed moving from lying on your back to sitting on the side of a flat bed without using bedrails?: A Little Help needed moving to and from a bed to a chair (including a wheelchair)?: A Little Help needed standing up from a chair using your arms (e.g., wheelchair or bedside chair)?: A Little Help needed to walk in hospital room?: A Little Help needed climbing 3-5 steps with a railing? : A Little 6  Click Score: 19    End of Session Equipment Utilized During Treatment: Gait belt;Oxygen (4 L O2 via nasal cannula) Activity Tolerance: Patient limited by fatigue Patient left: in chair;with call bell/phone within reach;with chair alarm set Nurse Communication: Mobility status;Precautions PT Visit Diagnosis: Unsteadiness on feet (R26.81);Other abnormalities of gait and mobility (R26.89);Muscle weakness (generalized) (M62.81);Difficulty in walking, not elsewhere classified (R26.2)     Time: 4742-5956 PT Time Calculation (min) (ACUTE ONLY): 23 min  Charges:  $Therapeutic Exercise: 23-37 mins                     Hendricks Limes, PT 12/20/20, 4:51 PM

## 2020-12-21 DIAGNOSIS — J9601 Acute respiratory failure with hypoxia: Secondary | ICD-10-CM | POA: Diagnosis not present

## 2020-12-21 DIAGNOSIS — U071 COVID-19: Secondary | ICD-10-CM | POA: Diagnosis not present

## 2020-12-21 LAB — COMPREHENSIVE METABOLIC PANEL
ALT: 22 U/L (ref 0–44)
AST: 19 U/L (ref 15–41)
Albumin: 3 g/dL — ABNORMAL LOW (ref 3.5–5.0)
Alkaline Phosphatase: 36 U/L — ABNORMAL LOW (ref 38–126)
Anion gap: 9 (ref 5–15)
BUN: 20 mg/dL (ref 8–23)
CO2: 30 mmol/L (ref 22–32)
Calcium: 8.3 mg/dL — ABNORMAL LOW (ref 8.9–10.3)
Chloride: 99 mmol/L (ref 98–111)
Creatinine, Ser: 0.44 mg/dL (ref 0.44–1.00)
GFR, Estimated: >60 mL/min (ref >60)
Glucose, Bld: 142 mg/dL — ABNORMAL HIGH (ref 70–99)
Potassium: 4.2 mmol/L (ref 3.5–5.1)
Sodium: 138 mmol/L (ref 135–145)
Total Bilirubin: 0.5 mg/dL (ref 0.3–1.2)
Total Protein: 5.3 g/dL — ABNORMAL LOW (ref 6.5–8.1)

## 2020-12-21 LAB — FERRITIN: Ferritin: 318 ng/mL — ABNORMAL HIGH (ref 11–307)

## 2020-12-21 MED ORDER — DM-GUAIFENESIN ER 30-600 MG PO TB12
1.0000 | ORAL_TABLET | Freq: Two times a day (BID) | ORAL | Status: DC
  Administered 2020-12-21 – 2020-12-23 (×4): 1 via ORAL
  Filled 2020-12-21 (×4): qty 1

## 2020-12-21 NOTE — Progress Notes (Addendum)
PROGRESS NOTE    Brandi [name]   NWG:[id number]  DOB: 08/11/1956  PCP: [name], DO    DOA: 12/16/2020 LOS: 5   Brief Narrative   65 year old female with history of prior tobacco use, unvaccinated, comes to the hospital with weakness, cough, shortness of breath.  She apparently had URI type symptoms in the middle of December and received an antibiotic as an outpatient.  About a week ago on 1/4 both she and her husband began feeling weak, body aches, fevers along with cough and shortness of breath.  Daughter also tells that patient was being a little bit confused.  She was diagnosed with COVID-19, chest x-ray showed multifocal pneumonia and was admitted to the hospital.     Assessment & Plan   Active Problems:   Acute hypoxemic respiratory failure due to COVID-19 [facility])   Acute respiratory failure with hypoxia secondary to COVID-19 multifocal pneumonia -present on admission with shortness of breath and requirement for 2 L/min supplemental oxygen to maintain O2 saturation. CRP has normalized from 1.3>> 0.7>> 0.5 1/14: Oxygen requirement slightly improved from 6 to 8 L yesterday to 4 L today.   1/15: remains on 4 L/min O2.  Still very dyspneic on exertion and generally very weak. -- Continue remdesivir, steroids -- Consider baricitinib depending on her oxygen requirement or rising inflammatory markers -- Incentive spirometry, flutter valve, proning as tolerated, up in chair, ambulate -- Supplemental oxygen as needed to maintain O2 sat greater than 88%, wean as tolerated -- Scheduled Mucinex, PRN Robitussin   Generalized weakness -due to acute illness.  PT and OT recommend home health.   Up to chair at least daily.  Ongoing PT OT while here is much as possible.  Indigestion -started on Pepcid, continue.  Maalox as needed.  Oral thrush - magic mouthwash / nystatin  Anxiety -situational due to acute illness and hospitalization.  Trial of low-dose Xanax as  needed.  Loose stools -Imodium as needed.  No fevers or leukocytosis, no recent antibiotics, very unlikely C. difficile  Hyponatremia -POA, likely due to dehydration.  Sodium improved after IV hydration.  Monitor.  Elevated LFTs -resolved.  POA, mild, likely due to COVID infection.  Monitor CMP.  Thrombocytopenia -POA, resolved, likely due to COVID infection  Leukopenia -POA due to COVID.  Monitor CBC  Hyperlipidemia -continue statin     DVT prophylaxis: enoxaparin (LOVENOX) injection 40 mg Start: 12/16/20 2200   Diet:  Diet Orders (From admission, onward)    Start     Ordered   12/16/20 1444  Diet regular Room service appropriate? Yes; Fluid consistency: Thin  Diet effective now       Question Answer Comment  Room service appropriate? Yes   Fluid consistency: Thin      12/16/20 1443            Code Status: Full Code    Subjective 12/21/20    Patient reports being very worried about her husband who was readmitted yesterday after d/c day before.  She reports still feeling quite weak.  Still gets short of breath on exertion.  Also reports chest congestion and ongoing cough.   Disposition Plan & Communication   Status is: Inpatient  Remains inpatient appropriate because:Inpatient level of care appropriate due to severity of illness.  Requiring IV therapies and oxygen as above for COVID-19 infection.   Dispo: The patient is from: Home              Anticipated d/c is to:  Home              Anticipated d/c date is: 2 days              Patient currently is not medically stable to d/c.   Family Communication: spoke with patient's daughter Selena Batten this afternoon by phone.   Consults, Procedures, Significant Events   Consultants:   None  Procedures:   None  Antimicrobials:  Anti-infectives (From admission, onward)   Start     Dose/Rate Route Frequency Ordered Stop   12/17/20 1000  remdesivir 100 mg in sodium chloride 0.9 % 100 mL IVPB       "Followed  by" Linked Group Details   100 mg 200 mL/hr over 30 Minutes Intravenous Daily 12/16/20 1443 12/20/20 0917   12/16/20 1530  remdesivir 200 mg in sodium chloride 0.9% 250 mL IVPB       "Followed by" Linked Group Details   200 mg 580 mL/hr over 30 Minutes Intravenous Once 12/16/20 1443 12/16/20 1926        Objective   Vitals:   12/20/20 1945 12/21/20 0542 12/21/20 0736 12/21/20 1216  BP: 123/73 108/76 98/67 103/66  Pulse: 89 79 74 88  Resp: 18 18 14 16   Temp: 97.9 F (36.6 C) 98.4 F (36.9 C) 97.9 F (36.6 C) (!) 96.6 F (35.9 C)  TempSrc: Oral Oral    SpO2: 94% 96% 100% 95%  Weight:      Height:       No intake or output data in the 24 hours ending 12/21/20 1508 Filed Weights   12/16/20 0736 12/16/20 2116  Weight: 54.4 kg 54.1 kg    Physical Exam:  General exam: awake, alert, NAD Respiratory system: symmetric chest rise, on 4 L/min nasal cannula oxygen, normal respiratory effort, no accessory muscle use or conversational dyspnea Cardiovascular system: normal S1/S2, RRR, no pedal edema.   Central nervous system: A&O x3. no gross focal neurologic deficits, normal speech Psychiatry: normal mood, congruent affect, judgement and insight appear normal  Labs   Data Reviewed: I have personally reviewed following labs and imaging studies  CBC: Recent Labs  Lab 12/16/20 0741 12/17/20 0400 12/18/20 0614 12/19/20 0605 12/20/20 0437  WBC 3.2* 2.8* 4.1 4.9 4.9  NEUTROABS  --  1.4* 2.5 3.3 3.6  HGB 15.2* 13.7 13.2 13.6 14.7  HCT 43.5 40.1 38.6 39.8 44.4  MCV 92.2 92.6 91.5 92.3 95.3  PLT 144* 154 188 236 270   Basic Metabolic Panel: Recent Labs  Lab 12/17/20 0400 12/18/20 0614 12/19/20 0605 12/20/20 0437 12/21/20 0510  NA 137 137 140 138 138  K 4.3 3.9 4.0 4.0 4.2  CL 97* 98 99 98 99  CO2 30 29 32 32 30  GLUCOSE 117* 140* 156* 187* 142*  BUN 13 17 20 21 20   CREATININE 0.40* 0.52 0.54 0.44 0.44  CALCIUM 8.8* 8.6* 8.7* 8.8* 8.3*  MG 2.0 2.1 2.4 2.4  --   PHOS  3.7 3.8 3.7 3.2  --    GFR: Estimated Creatinine Clearance: 59.9 mL/min (by C-G formula based on SCr of 0.44 mg/dL). Liver Function Tests: Recent Labs  Lab 12/17/20 0400 12/18/20 0614 12/19/20 0605 12/20/20 0437 12/21/20 0510  AST 39 33 33 26 19  ALT 21 22 24 26 22   ALKPHOS 42 43 40 45 36*  BILITOT 0.5 0.5 0.5 0.7 0.5  PROT 6.1* 5.7* 5.9* 6.6 5.3*  ALBUMIN 3.3* 3.1* 3.2* 3.5 3.0*   Recent Labs  Lab  12/16/20 0741  LIPASE 37   No results for input(s): AMMONIA in the last 168 hours. Coagulation Profile: No results for input(s): INR, PROTIME in the last 168 hours. Cardiac Enzymes: No results for input(s): CKTOTAL, CKMB, CKMBINDEX, TROPONINI in the last 168 hours. BNP (last 3 results) No results for input(s): PROBNP in the last 8760 hours. HbA1C: No results for input(s): HGBA1C in the last 72 hours. CBG: Recent Labs  Lab 12/16/20 0741  GLUCAP 105*   Lipid Profile: No results for input(s): CHOL, HDL, LDLCALC, TRIG, CHOLHDL, LDLDIRECT in the last 72 hours. Thyroid Function Tests: No results for input(s): TSH, T4TOTAL, FREET4, T3FREE, THYROIDAB in the last 72 hours. Anemia Panel: Recent Labs    12/20/20 0437 12/21/20 0510  FERRITIN 439* 318*   Sepsis Labs: No results for input(s): PROCALCITON, LATICACIDVEN in the last 168 hours.  No results found for this or any previous visit (from the past 240 hour(s)).    Imaging Studies   No results found.   Medications   Scheduled Meds: . aspirin EC  81 mg Oral Daily  . enoxaparin (LOVENOX) injection  40 mg Subcutaneous Q24H  . ezetimibe  10 mg Oral Daily  . famotidine  20 mg Oral Daily  . feeding supplement  237 mL Oral BID BM  . magic mouthwash  10 mL Oral QID  . methylPREDNISolone (SOLU-MEDROL) injection  30 mg Intravenous Q12H  . multivitamin with minerals  1 tablet Oral Daily  . rosuvastatin  20 mg Oral Daily  . sodium chloride flush  3 mL Intravenous Q12H   Continuous Infusions: . sodium chloride 250 mL  (12/20/20 0845)       LOS: 5 days    Time spent: 25 minutes with > 50% spent in coordination of care and direct patient contact.    [name], DO [facility]  12/21/2020, 3:08 PM    If 7PM-7AM, please contact night-coverage. How to contact the [facility] Attending or Consulting provider 7A - 7P or covering provider during after hours 7P -7A, for this patient?    1. Check the care team in [facility] and look for a) attending/consulting TRH provider listed and b) the [facility] team listed 2. Log into [email or web address] and use [facility]'s universal password to access. If you do not have the password, please contact the hospital operator. 3. Locate the [facility] provider you are looking for under [facility] and page to a number that you can be directly reached. 4. If you still have difficulty reaching the provider, please page the [facility] (Director on Call) for the Hospitalists listed on amion for assistance.

## 2020-12-22 DIAGNOSIS — J9601 Acute respiratory failure with hypoxia: Secondary | ICD-10-CM | POA: Diagnosis not present

## 2020-12-22 DIAGNOSIS — U071 COVID-19: Secondary | ICD-10-CM | POA: Diagnosis not present

## 2020-12-22 LAB — COMPREHENSIVE METABOLIC PANEL
ALT: 25 U/L (ref 0–44)
AST: 20 U/L (ref 15–41)
Albumin: 3.4 g/dL — ABNORMAL LOW (ref 3.5–5.0)
Alkaline Phosphatase: 39 U/L (ref 38–126)
Anion gap: 8 (ref 5–15)
BUN: 25 mg/dL — ABNORMAL HIGH (ref 8–23)
CO2: 31 mmol/L (ref 22–32)
Calcium: 8.8 mg/dL — ABNORMAL LOW (ref 8.9–10.3)
Chloride: 101 mmol/L (ref 98–111)
Creatinine, Ser: 0.44 mg/dL (ref 0.44–1.00)
GFR, Estimated: >60 mL/min (ref >60)
Glucose, Bld: 106 mg/dL — ABNORMAL HIGH (ref 70–99)
Potassium: 4.1 mmol/L (ref 3.5–5.1)
Sodium: 140 mmol/L (ref 135–145)
Total Bilirubin: 0.7 mg/dL (ref 0.3–1.2)
Total Protein: 6.2 g/dL — ABNORMAL LOW (ref 6.5–8.1)

## 2020-12-22 MED ORDER — PREDNISONE 20 MG PO TABS
20.0000 mg | ORAL_TABLET | Freq: Every day | ORAL | Status: DC
  Administered 2020-12-22 – 2020-12-23 (×2): 20 mg via ORAL
  Filled 2020-12-22 (×2): qty 1

## 2020-12-22 MED ORDER — ALBUTEROL SULFATE HFA 108 (90 BASE) MCG/ACT IN AERS
2.0000 | INHALATION_SPRAY | RESPIRATORY_TRACT | Status: DC | PRN
  Administered 2020-12-22 (×2): 2 via RESPIRATORY_TRACT
  Filled 2020-12-22: qty 6.7

## 2020-12-22 MED ORDER — ALPRAZOLAM 0.25 MG PO TABS
0.2500 mg | ORAL_TABLET | Freq: Three times a day (TID) | ORAL | Status: DC
  Administered 2020-12-22 – 2020-12-23 (×3): 0.25 mg via ORAL
  Filled 2020-12-22 (×3): qty 1

## 2020-12-22 MED ORDER — IPRATROPIUM-ALBUTEROL 20-100 MCG/ACT IN AERS
1.0000 | INHALATION_SPRAY | Freq: Four times a day (QID) | RESPIRATORY_TRACT | Status: DC
  Administered 2020-12-22 – 2020-12-23 (×4): 1 via RESPIRATORY_TRACT
  Filled 2020-12-22: qty 4

## 2020-12-22 NOTE — Progress Notes (Signed)
PROGRESS NOTE    Brandi [name]   ELF:[id number]  DOB: 27-Jun-1956  PCP: [name], DO    DOA: 12/16/2020 LOS: 6   Brief Narrative   65 year old female with history of prior tobacco use, unvaccinated, comes to the hospital with weakness, cough, shortness of breath.  She apparently had URI type symptoms in the middle of December and received an antibiotic as an outpatient.  About a week ago on 1/4 both she and her husband began feeling weak, body aches, fevers along with cough and shortness of breath.  Daughter also tells that patient was being a little bit confused.  She was diagnosed with COVID-19, chest x-ray showed multifocal pneumonia and was admitted to the hospital.     Assessment & Plan   Active Problems:   Acute hypoxemic respiratory failure due to COVID-19 [facility])   Acute respiratory failure with hypoxia secondary to COVID-19 multifocal pneumonia -present on admission with shortness of breath and requirement for 2 L/min supplemental oxygen to maintain O2 saturation. CRP has normalized from 1.3>> 0.7>> 0.5 1/14: Oxygen requirement slightly improved from 6 to 8 L yesterday to 4 L today.   1/15-16: remains on 4 L/min O2.  Continues to have DOE and weakness. -- Continue remdesivir, steroids -- Consider baricitinib depending on her oxygen requirement or rising inflammatory markers -- Incentive spirometry, flutter valve, proning as tolerated, up in chair, ambulate -- Supplemental oxygen as needed to maintain O2 sat greater than 88%, wean as tolerated -- Scheduled Mucinex, PRN Robitussin   Generalized weakness -due to acute illness.  PT and OT recommend home health.   Up to chair at least daily.  Ongoing PT OT while here is much as possible.  Indigestion -started on Pepcid, continue.  Maalox as needed.  Oral thrush - magic mouthwash / nystatin  Anxiety -situational due to acute illness and hospitalization.  Trial of low-dose Xanax as needed.  Loose stools  -Imodium as needed.  No fevers or leukocytosis, no recent antibiotics, very unlikely C. difficile  Hyponatremia -POA, likely due to dehydration.  Sodium improved after IV hydration.  Monitor.  Elevated LFTs -resolved.  POA, mild, likely due to COVID infection.  Monitor CMP.  Thrombocytopenia -POA, resolved, likely due to COVID infection  Leukopenia -POA due to COVID.  Monitor CBC  Hyperlipidemia -continue statin     DVT prophylaxis: enoxaparin (LOVENOX) injection 40 mg Start: 12/16/20 2200   Diet:  Diet Orders (From admission, onward)    Start     Ordered   12/16/20 1444  Diet regular Room service appropriate? Yes; Fluid consistency: Thin  Diet effective now       Question Answer Comment  Room service appropriate? Yes   Fluid consistency: Thin      12/16/20 1443            Code Status: Full Code    Subjective 12/22/20    Patient reports feeling rough today.  Feels more short of breath than yesterday.  Says she can't seem to catch her breath. Still feels extremely weak and fatigued.  No fever/chills.  No chest pain.  No other complaints.   Disposition Plan & Communication   Status is: Inpatient  Remains inpatient appropriate because:Inpatient level of care appropriate due to severity of illness.  Requiring IV therapies and supplemental oxygen as above for COVID-19 infection.  Remains quite weak and dyspneic on exertion.   Dispo: The patient is from: Home  Anticipated d/c is to: Home              Anticipated d/c date is: 2 days              Patient currently is not medically stable to d/c.   Family Communication: spoke with patient's daughter Selena Batten this afternoon by phone.   Consults, Procedures, Significant Events   Consultants:   None  Procedures:   None  Antimicrobials:  Anti-infectives (From admission, onward)   Start     Dose/Rate Route Frequency Ordered Stop   12/17/20 1000  remdesivir 100 mg in sodium chloride 0.9 % 100 mL  IVPB       "Followed by" Linked Group Details   100 mg 200 mL/hr over 30 Minutes Intravenous Daily 12/16/20 1443 12/20/20 0917   12/16/20 1530  remdesivir 200 mg in sodium chloride 0.9% 250 mL IVPB       "Followed by" Linked Group Details   200 mg 580 mL/hr over 30 Minutes Intravenous Once 12/16/20 1443 12/16/20 1926        Objective   Vitals:   12/22/20 0058 12/22/20 0424 12/22/20 0742 12/22/20 1139  BP: 119/82 114/79  118/81  Pulse: 78 92 88 81  Resp: 19 19 17 19   Temp: (!) 97.5 F (36.4 C) 97.8 F (36.6 C) 98 F (36.7 C) 97.8 F (36.6 C)  TempSrc:      SpO2: 100% (!) 89% 95% 94%  Weight:      Height:       No intake or output data in the 24 hours ending 12/22/20 1248 Filed Weights   12/16/20 0736 12/16/20 2116  Weight: 54.4 kg 54.1 kg    Physical Exam:  General exam: awake, alert, NAD Respiratory system: on 4 L/min nasal cannula O2, mildly increased respiratory effort with pursed lip breathing, CTAB diminished bases Cardiovascular system: normal S1/S2, RRR, no pedal edema.   Central nervous system: A&O x3. no gross focal neurologic deficits, normal speech Psychiatry: anxious mood, congruent affect  Labs   Data Reviewed: I have personally reviewed following labs and imaging studies  CBC: Recent Labs  Lab 12/16/20 0741 12/17/20 0400 12/18/20 0614 12/19/20 0605 12/20/20 0437  WBC 3.2* 2.8* 4.1 4.9 4.9  NEUTROABS  --  1.4* 2.5 3.3 3.6  HGB 15.2* 13.7 13.2 13.6 14.7  HCT 43.5 40.1 38.6 39.8 44.4  MCV 92.2 92.6 91.5 92.3 95.3  PLT 144* 154 188 236 270   Basic Metabolic Panel: Recent Labs  Lab 12/17/20 0400 12/18/20 0614 12/19/20 0605 12/20/20 0437 12/21/20 0510 12/22/20 0454  NA 137 137 140 138 138 140  K 4.3 3.9 4.0 4.0 4.2 4.1  CL 97* 98 99 98 99 101  CO2 30 29 32 32 30 31  GLUCOSE 117* 140* 156* 187* 142* 106*  BUN 13 17 20 21 20  25*  CREATININE 0.40* 0.52 0.54 0.44 0.44 0.44  CALCIUM 8.8* 8.6* 8.7* 8.8* 8.3* 8.8*  MG 2.0 2.1 2.4 2.4  --    --   PHOS 3.7 3.8 3.7 3.2  --   --    GFR: Estimated Creatinine Clearance: 59.9 mL/min (by C-G formula based on SCr of 0.44 mg/dL). Liver Function Tests: Recent Labs  Lab 12/18/20 0614 12/19/20 0605 12/20/20 0437 12/21/20 0510 12/22/20 0454  AST 33 33 26 19 20   ALT 22 24 26 22 25   ALKPHOS 43 40 45 36* 39  BILITOT 0.5 0.5 0.7 0.5 0.7  PROT 5.7* 5.9* 6.6 5.3*  6.2*  ALBUMIN 3.1* 3.2* 3.5 3.0* 3.4*   Recent Labs  Lab 12/16/20 0741  LIPASE 37   No results for input(s): AMMONIA in the last 168 hours. Coagulation Profile: No results for input(s): INR, PROTIME in the last 168 hours. Cardiac Enzymes: No results for input(s): CKTOTAL, CKMB, CKMBINDEX, TROPONINI in the last 168 hours. BNP (last 3 results) No results for input(s): PROBNP in the last 8760 hours. HbA1C: No results for input(s): HGBA1C in the last 72 hours. CBG: Recent Labs  Lab 12/16/20 0741  GLUCAP 105*   Lipid Profile: No results for input(s): CHOL, HDL, LDLCALC, TRIG, CHOLHDL, LDLDIRECT in the last 72 hours. Thyroid Function Tests: No results for input(s): TSH, T4TOTAL, FREET4, T3FREE, THYROIDAB in the last 72 hours. Anemia Panel: Recent Labs    12/20/20 0437 12/21/20 0510  FERRITIN 439* 318*   Sepsis Labs: No results for input(s): PROCALCITON, LATICACIDVEN in the last 168 hours.  No results found for this or any previous visit (from the past 240 hour(s)).    Imaging Studies   No results found.   Medications   Scheduled Meds: . ALPRAZolam  0.25 mg Oral TID  . aspirin EC  81 mg Oral Daily  . dextromethorphan-guaiFENesin  1 tablet Oral BID  . enoxaparin (LOVENOX) injection  40 mg Subcutaneous Q24H  . ezetimibe  10 mg Oral Daily  . famotidine  20 mg Oral Daily  . feeding supplement  237 mL Oral BID BM  . Ipratropium-Albuterol  1 puff Inhalation Q6H WA  . magic mouthwash  10 mL Oral QID  . multivitamin with minerals  1 tablet Oral Daily  . predniSONE  20 mg Oral Q breakfast  . rosuvastatin   20 mg Oral Daily  . sodium chloride flush  3 mL Intravenous Q12H   Continuous Infusions: . sodium chloride 250 mL (12/20/20 0845)       LOS: 6 days    Time spent: 25 minutes with > 50% spent in coordination of care and direct patient contact.    [name], DO [facility]  12/22/2020, 12:48 PM    If 7PM-7AM, please contact night-coverage. How to contact the [facility] Attending or Consulting provider 7A - 7P or covering provider during after hours 7P -7A, for this patient?    1. Check the care team in [facility] and look for a) attending/consulting TRH provider listed and b) the [facility] team listed 2. Log into [email or web address] and use [place]'s universal password to access. If you do not have the password, please contact the hospital operator. 3. Locate the [facility] provider you are looking for under [facility] and page to a number that you can be directly reached. 4. If you still have difficulty reaching the provider, please page the [facility] (Director on Call) for the Hospitalists listed on amion for assistance.

## 2020-12-23 LAB — C-REACTIVE PROTEIN: CRP: 0.6 mg/dL (ref <1.0)

## 2020-12-23 LAB — FIBRIN DERIVATIVES D-DIMER (ARMC ONLY): Fibrin derivatives D-dimer (ARMC): 278.75 ng/mL (FEU) (ref 0.00–499.00)

## 2020-12-23 LAB — FERRITIN: Ferritin: 368 ng/mL — ABNORMAL HIGH (ref 11–307)

## 2020-12-23 LAB — CREATININE, SERUM
Creatinine, Ser: 0.51 mg/dL (ref 0.44–1.00)
GFR, Estimated: >60 mL/min (ref >60)

## 2020-12-23 MED ORDER — PREDNISONE 10 MG PO TABS: ORAL_TABLET | ORAL | 0 refills | Status: AC

## 2020-12-23 MED ORDER — ADULT MULTIVITAMIN W/MINERALS CH: 1.0000 | ORAL_TABLET | Freq: Every day | ORAL | Status: AC

## 2020-12-23 MED ORDER — DM-GUAIFENESIN ER 30-600 MG PO TB12: 1.0000 | ORAL_TABLET | Freq: Two times a day (BID) | ORAL | 0 refills | Status: AC

## 2020-12-23 MED ORDER — FAMOTIDINE 20 MG PO TABS: 20.0000 mg | ORAL_TABLET | Freq: Every day | ORAL | 0 refills | Status: DC

## 2020-12-23 MED ORDER — GUAIFENESIN-DM 100-10 MG/5ML PO SYRP: 5.0000 mL | ORAL_SOLUTION | ORAL | 0 refills | Status: DC | PRN

## 2020-12-23 MED ORDER — ALUM & MAG HYDROXIDE-SIMETH 200-200-20 MG/5ML PO SUSP: 30.0000 mL | ORAL | 0 refills | Status: DC | PRN

## 2020-12-23 MED ORDER — ALBUTEROL SULFATE HFA 108 (90 BASE) MCG/ACT IN AERS: 2.0000 | INHALATION_SPRAY | RESPIRATORY_TRACT | 1 refills | Status: DC | PRN

## 2020-12-23 MED ORDER — ENSURE ENLIVE PO LIQD: 237.0000 mL | Freq: Two times a day (BID) | ORAL | 12 refills | Status: AC

## 2020-12-23 NOTE — TOC Transition Note (Signed)
Transition of Care [facility]) - CM/SW Discharge Note   Patient Details  Name: Brandi [name] MRN: [id number] Date of Birth: 12/03/1956  [facility]) CM/SW Contact:  Allayne Butcher, RN Phone Number: 12/23/2020, 12:07 PM   Clinical Narrative:    Patient medically ready for discharge home with home health services.  [facility] will provide home health RN, PT, and OT.  Cory with [facility] is aware of discharge today.  Patient qualifies for home oxygen, oxygen ordered from [employer] and will be delivered to the patient's room along with a rolling walker and 3 in 1.  Patient's daughter will be coming to pick her up today.   Final next level of care: Home w Home Health Services Barriers to Discharge: Barriers Resolved   Patient Goals and CMS Choice Patient states their goals for this hospitalization and ongoing recovery are:: Patient does not feel well enough to go home yet but agrees to home health services. CMS Medicare.gov Compare Post Acute Care list provided to:: Patient Choice offered to / list presented to : Patient  Discharge Placement                       Discharge Plan and Services   Discharge Planning Services: CM Consult Post Acute Care Choice: Home Health          DME Arranged: 3-N-1,Walker rolling DME Agency: [employer] Date DME Agency Contacted: 12/23/20 Time DME Agency Contacted: 1207 Representative spoke with at DME Agency: Ian Malkin HH Arranged: RN,PT,OT HH Agency: [facility] Date [facility] Agency Contacted: 12/23/20 Time HH Agency Contacted: 1207 Representative spoke with at [facility] Agency: Kandee Keen  Social Determinants of Health (SDOH) Interventions     Readmission Risk Interventions No flowsheet data found.

## 2020-12-23 NOTE — Discharge Summary (Signed)
Physician Discharge Summary  Brandi [name] WUJ:[id number] DOB: 1956/01/14 DOA: 12/16/2020  PCP: [name], DO  Admit date: 12/16/2020 Discharge date: 12/23/2020  Admitted From: home Disposition:  home  Recommendations for Outpatient Follow-up:  1. Follow up with PCP in 1-2 weeks 2. Please obtain BMP/CBC in one week 3. Please follow up on patient's need for supplemental oxygen.  At time of discharge, requiring 2 L/min.  Home Health: PT, OT, RN  Equipment/Devices: rolling walker, 3-n-1 bedside commode, oxygen  Discharge Condition: stable CODE STATUS: Full  Diet recommendation:  Regular   Discharge Diagnoses: Active Problems:   Acute hypoxemic respiratory failure due to COVID-19 [facility])    Summary of HPI and Hospital Course:  65 year old female with history of prior tobacco use, unvaccinated, comes to the hospital with weakness, cough, shortness of breath.  She apparently had URI type symptoms in the middle of December and received an antibiotic as an outpatient.  About a week ago on 1/4 both she and her husband began feeling weak, body aches, fevers along with cough and shortness of breath.  Daughter also tells that patient was being a little bit confused.  She was diagnosed with COVID-19, chest x-ray showed multifocal pneumonia and was admitted to the hospital.      Acute respiratory failure with hypoxia secondary to COVID-19 multifocal pneumonia -present on admission with shortness of breath and requirement for 2 L/min supplemental oxygen to maintain O2 saturation.   Treated with remdesivir, steroids, and supportive care. Persistent hypoxia, requiring 2 L/min oxygen at time of discharge. Home oxygen was set up. Discharged on prednisone taper of 5 more days, PRN albuterol, Mucinex    Close PCP follow up.   Generalized weakness -due to acute illness.  PT and OT recommend home health which was arranged. Indigestion -started on Pepcid, continue.  Maalox as needed.   Steroids possibly contributing. Oral thrush - magic mouthwash / nystatin Anxiety -situational due to acute illness and hospitalization.  Trial of low-dose Xanax as needed, tolerated. Loose stools -Imodium as needed.  No fevers or leukocytosis, no recent antibiotics, very unlikely C. difficile Hyponatremia -POA, likely due to dehydration.  Sodium improved after IV hydration.   Elevated LFTs -resolved.  POA, mild, likely due to COVID infection.   Thrombocytopenia -POA, resolved, likely due to COVID infection Leukopenia -POA due to COVID.   Hyperlipidemia -continue statin   Discharge Instructions   Discharge Instructions    Call MD for:   Complete by: As directed    Worsening and persistent shortness of breath, or if needing to use more oxygen to keep oxygen saturation level at or above 88%.   Call MD for:  extreme fatigue   Complete by: As directed    Call MD for:  persistant dizziness or light-headedness   Complete by: As directed    Call MD for:  persistant nausea and vomiting   Complete by: As directed    Call MD for:  severe uncontrolled pain   Complete by: As directed    Call MD for:  temperature >100.4   Complete by: As directed    Discharge instructions   Complete by: As directed    Continue using oxygen AS NEEDED.  Your oxygen level should be 88% to 93%.   As you get better, you'll be able to turn down or take off the oxygen.  When you can walk around your house and keep oxygen level at or aboe 88%, you can stop using the oxygen.  Use albuterol  inhaler for wheezing for shortness of breath.  Just be aware, albuterol makes some people have racing heart and feel anxious.  Just something to remember if you're feeling more anxious after using albuterol.    Take prednisone taper over next 5 days.  Take 20 mg for 2 days, then 10 mg for 3 days, then you're done with steroids.    Please see your primary care doctor within a week to follow up on your recovery.    Increase your  physical activity as tolerated.  It will help you recover and get off oxygen if you are up moving around.   Increase activity slowly   Complete by: As directed      Allergies as of 12/23/2020   No Known Allergies     Medication List    TAKE these medications   albuterol 108 (90 Base) MCG/ACT inhaler Commonly known as: VENTOLIN HFA Inhale 2 puffs into the lungs every 4 (four) hours as needed for wheezing or shortness of breath. What changed: when to take this   alendronate 70 MG tablet Commonly known as: FOSAMAX Take 1 tablet (70 mg total) by mouth once a week. Take with a full glass of water on an empty stomach.   alum & mag hydroxide-simeth 200-200-20 MG/5ML suspension Commonly known as: MAALOX/MYLANTA Take 30 mLs by mouth every 4 (four) hours as needed for indigestion or heartburn.   aspirin EC 81 MG tablet Take 1 tablet (81 mg total) by mouth daily.   Calcium 1200 1200-1000 MG-UNIT Chew Chew 1,200 mg by mouth daily.   dextromethorphan-guaiFENesin 30-600 MG 12hr tablet Commonly known as: MUCINEX DM Take 1 tablet by mouth 2 (two) times daily for 7 days.   guaiFENesin-dextromethorphan 100-10 MG/5ML syrup Commonly known as: ROBITUSSIN DM Take 5 mLs by mouth every 4 (four) hours as needed for cough.   ezetimibe 10 MG tablet Commonly known as: ZETIA Take 1 tablet (10 mg total) by mouth daily.   famotidine 20 MG tablet Commonly known as: PEPCID Take 1 tablet (20 mg total) by mouth daily. Start taking on: December 24, 2020   feeding supplement Liqd Take 237 mLs by mouth 2 (two) times daily between meals.   Investigational vitamin D 600 UNITS capsule SWOG S0812 Take 1 capsule (600 Units total) by mouth daily. Take with food.   multivitamin with minerals Tabs tablet Take 1 tablet by mouth daily. Start taking on: December 24, 2020   predniSONE 10 MG tablet Commonly known as: DELTASONE Take 2 tablets (20 mg total) by mouth daily with breakfast for 2 days, THEN 1 tablet  (10 mg total) daily with breakfast for 3 days. Start taking on: December 24, 2020   rosuvastatin 20 MG tablet Commonly known as: CRESTOR Take 1 tablet (20 mg total) by mouth daily.            Durable Medical Equipment  (From admission, onward)         Start     Ordered   12/23/20 1203  For home use only DME oxygen  Once       Question Answer Comment  Length of Need 6 Months   Mode or (Route) Nasal cannula   Liters per Minute 2   Frequency Continuous (stationary and portable oxygen unit needed)   Oxygen conserving device No   Oxygen delivery system Gas      12/23/20 1202   12/21/20 0852  For home use only DME 3 n 1  Once  12/21/20 0851   12/21/20 0851  For home use only DME Walker rolling  Once       Question Answer Comment  Walker: With 5 Inch Wheels   Patient needs a walker to treat with the following condition Unsteady gait      12/21/20 0851          Follow-up Information    Care, [facility] Follow up.   Specialty: Home Health Services Why: Home Health has been arranged with [facility] for RN, PT and OT.  They will be calling you to schedule a time to go out for an initial visit.  Contact information: [address] [place] [address] [phone]              No Known Allergies  Consultations:  none   Procedures/Studies: DG Chest 2 View  Result Date: 12/16/2020 CLINICAL DATA:  Shortness of breath, hypoxia EXAM: CHEST - 2 VIEW COMPARISON:  CT chest dated 04/01/2020 FINDINGS: Faint patchy opacity in the left upper lobe/lingula and bilateral lower lobes, suggesting mild multifocal pneumonia. No pleural effusion or pneumothorax. The heart is normal in size. Visualized osseous structures are within normal limits. IMPRESSION: Mild multifocal pneumonia. Electronically Signed   By: Charline Bills M.D.   On: 12/16/2020 08:12       Subjective: Pt says she still feels weak and tired.  Overall feels ready to go home, but worried  about Sharl Ma, and not being able to be with him.  Will have assistance of family at home.  Cough little better but continues.  No other acute complaints.   Discharge Exam: Vitals:   12/22/20 1956 12/23/20 0527  BP: 106/68 (!) 102/56  Pulse: 78 (!) 101  Resp: 18   Temp: 97.6 F (36.4 C) 97.7 F (36.5 C)  SpO2: 98% 90%   Vitals:   12/22/20 1139 12/22/20 1500 12/22/20 1955 12/23/20 0527  BP: 118/81 115/78 106/68 (!) 102/56  Pulse: 81 86 78 (!) 101  Resp: 19 19 18    Temp: 97.8 F (36.6 C) 97.8 F (36.6 C) 97.6 F (36.4 C) 97.7 F (36.5 C)  TempSrc:   Oral Oral  SpO2: 94% 95% 98% 90%  Weight:      Height:        General: Pt is alert, awake, not in acute distress Cardiovascular: RRR, S1/S2 +, no rubs, no gallops Respiratory: CTA bilaterally, no wheezing, no rhonchi Abdominal: Soft, NT, ND, bowel sounds + Extremities: no edema, no cyanosis    The results of significant diagnostics from this hospitalization (including imaging, microbiology, ancillary and laboratory) are listed below for reference.     Microbiology: No results found for this or any previous visit (from the past 240 hour(s)).   Labs: BNP (last 3 results) No results for input(s): BNP in the last 8760 hours. Basic Metabolic Panel: Recent Labs  Lab 12/17/20 0400 12/18/20 0614 12/19/20 0605 12/20/20 0437 12/21/20 0510 12/22/20 0454 12/23/20 0711  NA 137 137 140 138 138 140  --   K 4.3 3.9 4.0 4.0 4.2 4.1  --   CL 97* 98 99 98 99 101  --   CO2 30 29 32 32 30 31  --   GLUCOSE 117* 140* 156* 187* 142* 106*  --   BUN 13 17 20 21 20  25*  --   CREATININE 0.40* 0.52 0.54 0.44 0.44 0.44 0.51  CALCIUM 8.8* 8.6* 8.7* 8.8* 8.3* 8.8*  --   MG 2.0 2.1 2.4 2.4  --   --   --  PHOS 3.7 3.8 3.7 3.2  --   --   --    Liver Function Tests: Recent Labs  Lab 12/18/20 0614 12/19/20 0605 12/20/20 0437 12/21/20 0510 12/22/20 0454  AST 33 33 26 19 20   ALT 22 24 26 22 25   ALKPHOS 43 40 45 36* 39  BILITOT 0.5 0.5  0.7 0.5 0.7  PROT 5.7* 5.9* 6.6 5.3* 6.2*  ALBUMIN 3.1* 3.2* 3.5 3.0* 3.4*   No results for input(s): LIPASE, AMYLASE in the last 168 hours. No results for input(s): AMMONIA in the last 168 hours. CBC: Recent Labs  Lab 12/17/20 0400 12/18/20 0614 12/19/20 0605 12/20/20 0437  WBC 2.8* 4.1 4.9 4.9  NEUTROABS 1.4* 2.5 3.3 3.6  HGB 13.7 13.2 13.6 14.7  HCT 40.1 38.6 39.8 44.4  MCV 92.6 91.5 92.3 95.3  PLT 154 188 236 270   Cardiac Enzymes: No results for input(s): CKTOTAL, CKMB, CKMBINDEX, TROPONINI in the last 168 hours. BNP: Invalid input(s): POCBNP CBG: No results for input(s): GLUCAP in the last 168 hours. D-Dimer No results for input(s): DDIMER in the last 72 hours. Hgb A1c No results for input(s): HGBA1C in the last 72 hours. Lipid Profile No results for input(s): CHOL, HDL, LDLCALC, TRIG, CHOLHDL, LDLDIRECT in the last 72 hours. Thyroid function studies No results for input(s): TSH, T4TOTAL, T3FREE, THYROIDAB in the last 72 hours.  Invalid input(s): FREET3 Anemia work up Recent Labs    12/21/20 0510 12/23/20 0711  FERRITIN 318* 368*   Urinalysis    Component Value Date/Time   COLORURINE AMBER (A) 12/16/2020 1139   APPEARANCEUR HAZY (A) 12/16/2020 1139   LABSPEC 1.026 12/16/2020 1139   PHURINE 5.0 12/16/2020 1139   GLUCOSEU NEGATIVE 12/16/2020 1139   HGBUR NEGATIVE 12/16/2020 1139   BILIRUBINUR NEGATIVE 12/16/2020 1139   KETONESUR 20 (A) 12/16/2020 1139   PROTEINUR 100 (A) 12/16/2020 1139   NITRITE NEGATIVE 12/16/2020 1139   LEUKOCYTESUR NEGATIVE 12/16/2020 1139   Sepsis Labs Invalid input(s): PROCALCITONIN,  WBC,  LACTICIDVEN Microbiology No results found for this or any previous visit (from the past 240 hour(s)).   Time coordinating discharge: Over 30 minutes  SIGNED:   Pennie BanterKelly A Alter Moss, DO [facility] 12/23/2020, 12:10 PM   If 7PM-7AM, please contact night-coverage [email or web address]

## 2020-12-23 NOTE — Progress Notes (Signed)
SATURATION QUALIFICATIONS: (This note is used to comply with regulatory documentation for home oxygen)  Patient Saturations on Room Air at Rest = 86%  Patient Saturations on Room Air while Ambulating = 85%  Patient Saturations on 2 Liters of oxygen while Ambulating = 89%  Please briefly explain why patient needs home oxygen: pt has oxygen requirements at rest to maintain sats greater than 86%

## 2020-12-23 NOTE — Progress Notes (Signed)
Nutrition Follow-up  RD working remotely.  DOCUMENTATION CODES:   Not applicable  INTERVENTION:  Recommend increasing to Ensure Enlive po TID, each supplement provides 350 kcal and 20 grams of protein. Patient prefers strawberry. She has a discharge order at this time so unable to increase order.  Continue MVI po daily.  NUTRITION DIAGNOSIS:   Increased nutrient needs related to acute illness,catabolic illness (COVID-19 infection) as evidenced by estimated needs.  Ongoing.  GOAL:   Patient will meet greater than or equal to 90% of their needs  Progressing.  MONITOR:   PO intake,Supplement acceptance,Labs,Weight trends  REASON FOR ASSESSMENT:   Malnutrition Screening Tool    ASSESSMENT:   65 year old female with medical history of prior tobacco use and HLD. She presented to the ED with weakness, cough, fevers, body aches, and shortness of breath which began on 1/4. Daughter reported that patient had also been slightly confused PTA. She is unvaccinated against COVID-19. She was found to be COVID positive with CXR showing multifocal PNA.  Spoke with patient over the phone. She reports her appetite and intake were decreased PTA. She reports it is increasing now and she is starting to eat more but is still not back to baseline. She is unable to provide specifics on intake as she is anxious to discharge today. No meal documentation available in chart. She reports she enjoys the Ensure and is drinking them BID. Plan is for patient to discharge home today and order has already been placed in chart. Would increase to Ensure TID but patient with discharge order now. Encouraged patient to continue drinking high-protein, high-calorie ONS at home after discharge until appetite and intake is back to normal.  Medications reviewed and include: Xanax, famotidine, magic mouthwash, MVI daily, prednisone 20 mg daily.  Labs reviewed: BUN 25.  I/O: 2 occurrences unmeasured UOP yesterday  No  weight to trend since 1/10 (54.1 kg). Patient reports she is unsure of her UBW or if she has lost any weight.  NUTRITION - FOCUSED PHYSICAL EXAM:  Unable to complete as RD is working remotely.  Diet Order:   Diet Order            Diet regular Room service appropriate? Yes; Fluid consistency: Thin  Diet effective now                EDUCATION NEEDS:   Not appropriate for education at this time  Skin:  Skin Assessment: Reviewed RN Assessment  Last BM:  12/22/2020 medium type 6  Height:   Ht Readings from Last 1 Encounters:  12/16/20 5\' 4"  (1.626 m)   Weight:   Wt Readings from Last 1 Encounters:  12/16/20 54.1 kg   BMI:  Body mass index is 20.47 kg/m.  Estimated Nutritional Needs:   Kcal:  1650-1850 kcal  Protein:  85-100 grams  Fluid:  >/= 2.2 L/day  02/13/21, MS, RD, LDN Pager number available on Amion

## 2020-12-24 ENCOUNTER — Telehealth: Payer: Self-pay

## 2020-12-24 NOTE — Telephone Encounter (Signed)
Transition Care Management Follow-up Telephone Call  Date of discharge and from where: 12/23/2020 [facility]  How have you been since you were released from the hospital? Not good  Any questions or concerns? Yes. Needing home health. Husband is still in the hospital  Items Reviewed:  Did the pt receive and understand the discharge instructions provided? Yes   Medications obtained and verified? No  Daughter going to pick  Other? No   Any new allergies since your discharge? No   Dietary orders reviewed? Yes  Do you have support at home? Yes   Home Care and Equipment/Supplies: Were home health services ordered? yes If so, what is the name of the agency? [facility]  Has the agency set up a time to come to the patient's home? no Were any new equipment or medical supplies ordered?  Yes: oxygen What is the name of the medical supply agency? ? Were you able to get the supplies/equipment? yes Do you have any questions related to the use of the equipment or supplies? Yes: when will home health come. Discharge summary says that [facility] will be reaching out to schedule a time to come.  Functional Questionnaire: (I = Independent and D = Dependent) ADLs: D  Bathing/Dressing- D  Meal Prep- D  Eating- I  Maintaining continence- D  Transferring/Ambulation- I  Managing Meds- D  Follow up appointments reviewed:   PCP Hospital f/u appt confirmed? Yes  Scheduled to see Dr. Althea Charon on 01/03/2021 @ 1:20 (virtual).  Are transportation arrangements needed? No   If their condition worsens, is the pt aware to call PCP or go to the Emergency Dept.? Yes  Was the patient provided with contact information for the PCP's office or ED? Yes  Was to pt encouraged to call back with questions or concerns? Yes

## 2020-12-27 DIAGNOSIS — J1282 Pneumonia due to coronavirus disease 2019: Secondary | ICD-10-CM | POA: Diagnosis not present

## 2020-12-27 DIAGNOSIS — R000 Tachycardia, unspecified: Secondary | ICD-10-CM | POA: Diagnosis not present

## 2020-12-27 DIAGNOSIS — J9601 Acute respiratory failure with hypoxia: Secondary | ICD-10-CM | POA: Diagnosis not present

## 2020-12-27 DIAGNOSIS — Z7952 Long term (current) use of systemic steroids: Secondary | ICD-10-CM | POA: Diagnosis not present

## 2020-12-27 DIAGNOSIS — E871 Hypo-osmolality and hyponatremia: Secondary | ICD-10-CM | POA: Diagnosis not present

## 2020-12-27 DIAGNOSIS — Z9981 Dependence on supplemental oxygen: Secondary | ICD-10-CM | POA: Diagnosis not present

## 2020-12-27 DIAGNOSIS — D696 Thrombocytopenia, unspecified: Secondary | ICD-10-CM | POA: Diagnosis not present

## 2020-12-27 DIAGNOSIS — U071 COVID-19: Secondary | ICD-10-CM | POA: Diagnosis not present

## 2020-12-27 DIAGNOSIS — E785 Hyperlipidemia, unspecified: Secondary | ICD-10-CM | POA: Diagnosis not present

## 2021-01-01 DIAGNOSIS — Z7952 Long term (current) use of systemic steroids: Secondary | ICD-10-CM | POA: Diagnosis not present

## 2021-01-01 DIAGNOSIS — R000 Tachycardia, unspecified: Secondary | ICD-10-CM | POA: Diagnosis not present

## 2021-01-01 DIAGNOSIS — J1282 Pneumonia due to coronavirus disease 2019: Secondary | ICD-10-CM | POA: Diagnosis not present

## 2021-01-01 DIAGNOSIS — E871 Hypo-osmolality and hyponatremia: Secondary | ICD-10-CM | POA: Diagnosis not present

## 2021-01-01 DIAGNOSIS — Z9981 Dependence on supplemental oxygen: Secondary | ICD-10-CM | POA: Diagnosis not present

## 2021-01-01 DIAGNOSIS — D696 Thrombocytopenia, unspecified: Secondary | ICD-10-CM | POA: Diagnosis not present

## 2021-01-01 DIAGNOSIS — J9601 Acute respiratory failure with hypoxia: Secondary | ICD-10-CM | POA: Diagnosis not present

## 2021-01-01 DIAGNOSIS — E785 Hyperlipidemia, unspecified: Secondary | ICD-10-CM | POA: Diagnosis not present

## 2021-01-01 DIAGNOSIS — U071 COVID-19: Secondary | ICD-10-CM | POA: Diagnosis not present

## 2021-01-02 ENCOUNTER — Telehealth: Payer: Self-pay

## 2021-01-02 DIAGNOSIS — J1282 Pneumonia due to coronavirus disease 2019: Secondary | ICD-10-CM | POA: Diagnosis not present

## 2021-01-02 DIAGNOSIS — U071 COVID-19: Secondary | ICD-10-CM

## 2021-01-02 DIAGNOSIS — Z7952 Long term (current) use of systemic steroids: Secondary | ICD-10-CM | POA: Diagnosis not present

## 2021-01-02 DIAGNOSIS — R000 Tachycardia, unspecified: Secondary | ICD-10-CM | POA: Diagnosis not present

## 2021-01-02 DIAGNOSIS — J9601 Acute respiratory failure with hypoxia: Secondary | ICD-10-CM | POA: Diagnosis not present

## 2021-01-02 DIAGNOSIS — Z9981 Dependence on supplemental oxygen: Secondary | ICD-10-CM | POA: Diagnosis not present

## 2021-01-02 DIAGNOSIS — E871 Hypo-osmolality and hyponatremia: Secondary | ICD-10-CM | POA: Diagnosis not present

## 2021-01-02 DIAGNOSIS — E785 Hyperlipidemia, unspecified: Secondary | ICD-10-CM | POA: Diagnosis not present

## 2021-01-02 DIAGNOSIS — D696 Thrombocytopenia, unspecified: Secondary | ICD-10-CM | POA: Diagnosis not present

## 2021-01-02 MED ORDER — GUAIFENESIN ER 600 MG PO TB12: 1200.0000 mg | ORAL_TABLET | Freq: Two times a day (BID) | ORAL | 0 refills | Status: DC | PRN

## 2021-01-02 NOTE — Telephone Encounter (Signed)
Copied from CRM [id number]. Topic: General - Other >> Jan 02, 2021  1:04 PM Jaquita Rector A wrote: Reason for CRM: Lawson Fiscal nurse  with [facility] called in to inquire if Dr Kirtland Bouchard can send an Rx for Mucinex to patient Pharmacy. Also wanted to inform Dr Kirtland Bouchard that patient and her husband both just got out the hospital and her husband passed away this week. Please call patient or Lawson Fiscal if Rx is sent to pharmacy  Ph# [phone]    Please advise.. she has a virtual visit with you tomorrow.

## 2021-01-02 NOTE — Telephone Encounter (Signed)
Patient notified of recommendations. 

## 2021-01-02 NOTE — Telephone Encounter (Signed)
They requested a call back if you can notify them:  Sent rx Mucinex take 2 twice a day - sent to [facility]  Should stop robitussin DM cough syrup if still has that, as it is a duplicate treatment  Any other questions can address at visit tomorrow virtually  [name], DO [facility] 01/02/2021, 3:20 PM

## 2021-01-03 ENCOUNTER — Other Ambulatory Visit: Payer: Self-pay

## 2021-01-03 ENCOUNTER — Telehealth: Payer: Self-pay | Admitting: Family Medicine

## 2021-01-03 ENCOUNTER — Encounter: Payer: Self-pay | Admitting: Family Medicine

## 2021-01-03 ENCOUNTER — Telehealth (INDEPENDENT_AMBULATORY_CARE_PROVIDER_SITE_OTHER): Payer: Medicare HMO | Admitting: Family Medicine

## 2021-01-03 ENCOUNTER — Telehealth: Payer: Medicare HMO | Admitting: Family Medicine

## 2021-01-03 VITALS — Wt 120.0 lb

## 2021-01-03 DIAGNOSIS — U071 COVID-19: Secondary | ICD-10-CM | POA: Diagnosis not present

## 2021-01-03 DIAGNOSIS — J432 Centrilobular emphysema: Secondary | ICD-10-CM | POA: Diagnosis not present

## 2021-01-03 NOTE — Progress Notes (Signed)
Virtual Visit via Telephone The purpose of this virtual visit is to provide medical care while limiting exposure to the novel coronavirus (COVID19) for both patient and office staff.  Consent was obtained for phone visit:  Yes.   Answered questions that patient had about telehealth interaction:  Yes.   I discussed the limitations, risks, security and privacy concerns of performing an evaluation and management service by telephone. I also discussed with the patient that there may be a patient responsible charge related to this service. The patient expressed understanding and agreed to proceed.  Patient Location: Home Provider Location: [facility] (Office)  Participants in virtual visit: - Patient: Brandi [name] - CMA: Orinda Kenner, CMA - Provider: Dr Parks Ranger  ---------------------------------------------------------------------- Chief Complaint  Patient presents with  . Hospitalization Follow-up  . COVID    S: Reviewed CMA documentation. I have called patient and gathered additional HPI as follows:  HOSPITAL FOLLOW-UP VISIT  Hospital/Location: [facility] Date of Admission: 12/16/20 Date of Discharge: 12/23/20 Transitions of care telephone call: completed by Kellie Simmering LPN 05/27/29  Reason for Admission: [facility] H&P and Discharge Summary have been reviewed - Patient presents today about 11 days after recent hospitalization. Brief summary of recent course, patient had COVID, hospitalized, treated with IV anti viral and oxygen / steroids.  - Today reports overall has done well after discharge. Symptoms of cough still present and needing oxygen  Supplemental oxygen at home , 3 L oxygen at night. Unfortunately reports her husband passed covid at same time, in ICU Finished steroid Albuterol - Mucinex rx was sent recently She has home health   I have reviewed the discharge medication list, and have reconciled the current and discharge  medications today.  Denies any fevers, chills, sweats, body ache, sinus pain or pressure, headache, abdominal pain, diarrhea  Past Medical History:  Diagnosis Date  . Hyperlipidemia    Social History   Tobacco Use  . Smoking status: Former Smoker    Packs/day: 0.75    Years: 41.00    Pack years: 30.75    Types: E-cigarettes, Cigarettes    Quit date: 12/08/2015    Years since quitting: 5.0  . Smokeless tobacco: Former Network engineer  . Vaping Use: Never used  Substance Use Topics  . Alcohol use: No  . Drug use: No    Current Outpatient Medications:  .  albuterol (VENTOLIN HFA) 108 (90 Base) MCG/ACT inhaler, Inhale 2 puffs into the lungs every 4 (four) hours as needed for wheezing or shortness of breath., Disp: 8 g, Rfl: 1 .  alendronate (FOSAMAX) 70 MG tablet, Take 1 tablet (70 mg total) by mouth once a week. Take with a full glass of water on an empty stomach., Disp: 4 tablet, Rfl: 11 .  alum & mag hydroxide-simeth (MAALOX/MYLANTA) 200-200-20 MG/5ML suspension, Take 30 mLs by mouth every 4 (four) hours as needed for indigestion or heartburn., Disp: 355 mL, Rfl: 0 .  aspirin EC 81 MG tablet, Take 1 tablet (81 mg total) by mouth daily., Disp: 90 tablet, Rfl: 3 .  Calcium Carbonate-Vit D-Min (CALCIUM 1200) 1200-1000 MG-UNIT CHEW, Chew 1,200 mg by mouth daily., Disp: 30 each, Rfl: 11 .  famotidine (PEPCID) 20 MG tablet, Take 1 tablet (20 mg total) by mouth daily., Disp: 30 tablet, Rfl: 0 .  feeding supplement (ENSURE ENLIVE / ENSURE PLUS) LIQD, Take 237 mLs by mouth 2 (two) times daily between meals., Disp: 237 mL, Rfl: 12 .  guaiFENesin (MUCINEX) 600 MG 12 hr tablet, Take 2 tablets (1,200 mg total) by mouth 2 (two) times daily as needed for cough or to loosen phlegm., Disp: 30 tablet, Rfl: 0 .  guaiFENesin-dextromethorphan (ROBITUSSIN DM) 100-10 MG/5ML syrup, Take 5 mLs by mouth every 4 (four) hours as needed for cough., Disp: 118 mL, Rfl: 0 .  Investigational vitamin D 600 UNITS  capsule SWOG [id number], Take 1 capsule (600 Units total) by mouth daily. Take with food., Disp: 30 capsule, Rfl: 11 .  Multiple Vitamin (MULTIVITAMIN WITH MINERALS) TABS tablet, Take 1 tablet by mouth daily., Disp: , Rfl:  .  ezetimibe (ZETIA) 10 MG tablet, Take 1 tablet (10 mg total) by mouth daily., Disp: 90 tablet, Rfl: 3 .  rosuvastatin (CRESTOR) 20 MG tablet, Take 1 tablet (20 mg total) by mouth daily., Disp: 90 tablet, Rfl: 3  Depression screen PHQ 2/9 04/08/2020  Decreased Interest 0  Down, Depressed, Hopeless 0  PHQ - 2 Score 0    No flowsheet data found.  -------------------------------------------------------------------------- O: No physical exam performed due to remote telephone encounter.  Lab results reviewed.  I have personally reviewed the radiology report from 12/16/20.  CLINICAL DATA:  Shortness of breath, hypoxia  EXAM: CHEST - 2 VIEW  COMPARISON:  CT chest dated 04/01/2020  FINDINGS: Faint patchy opacity in the left upper lobe/lingula and bilateral lower lobes, suggesting mild multifocal pneumonia. No pleural effusion or pneumothorax.  The heart is normal in size.  Visualized osseous structures are within normal limits.  IMPRESSION: Mild multifocal pneumonia.   Electronically Signed   By: Julian Hy M.D.   On: 12/16/2020 08:12  Recent Results (from the past 2160 hour(s))  CBG monitoring, ED     Status: Abnormal   Collection Time: 12/16/20  7:41 AM  Result Value Ref Range   Glucose-Capillary 105 (H) 70 - 99 mg/dL    Comment: Glucose reference range applies only to samples taken after fasting for at least 8 hours.   Comment 1 Notify RN    Comment 2 Document in Chart   CBC     Status: Abnormal   Collection Time: 12/16/20  7:41 AM  Result Value Ref Range   WBC 3.2 (L) 4.0 - 10.5 K/uL   RBC 4.72 3.87 - 5.11 MIL/uL   Hemoglobin 15.2 (H) 12.0 - 15.0 g/dL   HCT 43.5 36.0 - 46.0 %   MCV 92.2 80.0 - 100.0 fL   MCH 32.2 26.0 - 34.0 pg    MCHC 34.9 30.0 - 36.0 g/dL   RDW 12.3 11.5 - 15.5 %   Platelets 144 (L) 150 - 400 K/uL   nRBC 0.0 0.0 - 0.2 %    Comment: Performed at [facility], [address] [place], [place] [address]  Comprehensive metabolic panel     Status: Abnormal   Collection Time: 12/16/20  7:41 AM  Result Value Ref Range   Sodium 133 (L) 135 - 145 mmol/L   Potassium 3.8 3.5 - 5.1 mmol/L   Chloride 93 (L) 98 - 111 mmol/L   CO2 28 22 - 32 mmol/L   Glucose, Bld 114 (H) 70 - 99 mg/dL    Comment: Glucose reference range applies only to samples taken after fasting for at least 8 hours.   BUN 15 8 - 23 mg/dL   Creatinine, Ser 0.52 0.44 - 1.00 mg/dL   Calcium 9.0 8.9 - 10.3 mg/dL   Total Protein 7.3 6.5 - 8.1 g/dL   Albumin 4.0  3.5 - 5.0 g/dL   AST 52 (H) 15 - 41 U/L   ALT 25 0 - 44 U/L   Alkaline Phosphatase 51 38 - 126 U/L   Total Bilirubin 0.8 0.3 - 1.2 mg/dL   GFR, Estimated >60 >60 mL/min    Comment: (NOTE) Calculated using the CKD-EPI Creatinine Equation (2021)    Anion gap 12 5 - 15    Comment: Performed at [facility], [address] [place], [place] [address]  Type and screen [place]     Status: None   Collection Time: 12/16/20  7:41 AM  Result Value Ref Range   ABO/RH(D) A POS    Antibody Screen NEG    Sample Expiration      12/19/2020,2359 Performed at [facility], [place], [place] [address]   Troponin I (High Sensitivity)     Status: None   Collection Time: 12/16/20  7:41 AM  Result Value Ref Range   Troponin I (High Sensitivity) 10 <18 ng/L    Comment: (NOTE) Elevated high sensitivity troponin I (hsTnI) values and significant  changes across serial measurements may suggest ACS but many other  chronic and acute conditions are known to elevate hsTnI results.  Refer to the "Links" section for chest pain algorithms and additional  guidance. Performed at [facility], [address]  [place], [place] [address]   Lipase, blood     Status: None   Collection Time: 12/16/20  7:41 AM  Result Value Ref Range   Lipase 37 11 - 51 U/L    Comment: Performed at [facility], [place], [place] [address]  POC SARS Coronavirus 2 Ag-ED - Nasal Swab (BD Veritor Kit)     Status: Abnormal   Collection Time: 12/16/20 11:03 AM  Result Value Ref Range   SARS Coronavirus 2 Ag POSITIVE (A) NEGATIVE    Comment: (NOTE) SARS-CoV-2 antigen PRESENT.  Positive results indicate the presence of viral antigens, but clinical correlation with patient history and other diagnostic information is necessary to determine patient infection status.  Positive results do not rule out bacterial infection or co-infection  with other viruses. False positive results are rare but can occur, and confirmatory RT-PCR testing may be appropriate in some circumstances. The expected result is Negative.  Fact Sheet for Patients: PodPark.tn Fact Sheet for Providers: GiftContent.is   This test is not yet approved or cleared by the [place] FDA and  has been authorized for detection and/or diagnosis of SARS-CoV-2 by FDA under an Emergency Use Authorization (EUA).  This EUA will remain in effect (meaning this test can be used) for the duration of  the COVID-19 declaration under Section 564(b)(1) of the Act, 21 U.S.C. section 360bbb-3(b)(1), unless  the authorization is terminated or revoked sooner.    Urinalysis, Complete w Microscopic Urine, Random     Status: Abnormal   Collection Time: 12/16/20 11:39 AM  Result Value Ref Range   Color, Urine AMBER (A) YELLOW    Comment: BIOCHEMICALS MAY BE AFFECTED BY COLOR   APPearance HAZY (A) CLEAR   Specific Gravity, Urine 1.026 1.005 - 1.030   pH 5.0 5.0 - 8.0   Glucose, UA NEGATIVE NEGATIVE mg/dL   Hgb urine dipstick NEGATIVE NEGATIVE   Bilirubin Urine NEGATIVE NEGATIVE   Ketones, ur 20  (A) NEGATIVE mg/dL   Protein, ur 100 (A) NEGATIVE mg/dL   Nitrite NEGATIVE NEGATIVE   Leukocytes,Ua NEGATIVE NEGATIVE   RBC /  HPF 0-5 0 - 5 RBC/hpf   WBC, UA 6-10 0 - 5 WBC/hpf   Bacteria, UA RARE (A) NONE SEEN   Squamous Epithelial / LPF 6-10 0 - 5   Mucus PRESENT    Hyaline Casts, UA PRESENT     Comment: Performed at [facility], [place], [place] [address]  Troponin I (High Sensitivity)     Status: None   Collection Time: 12/16/20 11:39 AM  Result Value Ref Range   Troponin I (High Sensitivity) 9 <18 ng/L    Comment: (NOTE) Elevated high sensitivity troponin I (hsTnI) values and significant  changes across serial measurements may suggest ACS but many other  chronic and acute conditions are known to elevate hsTnI results.  Refer to the "Links" section for chest pain algorithms and additional  guidance. Performed at [facility], [address] [place], [place] [address]   HIV Antibody (routine testing w rflx)     Status: None   Collection Time: 12/17/20  4:00 AM  Result Value Ref Range   HIV Screen 4th Generation wRfx Non Reactive Non Reactive    Comment: Performed at [facility], [place] [address], [place], [place] [address]  Hepatitis C antibody     Status: None   Collection Time: 12/17/20  4:00 AM  Result Value Ref Range   HCV Ab NON REACTIVE NON REACTIVE    Comment: (NOTE) Nonreactive HCV antibody screen is consistent with no HCV infections,  unless recent infection is suspected or other evidence exists to indicate HCV infection.  Performed at [facility], [place] [address], [place], [place] [address]   CBC with Differential/Platelet     Status: Abnormal   Collection Time: 12/17/20  4:00 AM  Result Value Ref Range   WBC 2.8 (L) 4.0 - 10.5 K/uL   RBC 4.33 3.87 - 5.11 MIL/uL   Hemoglobin 13.7 12.0 - 15.0 g/dL   HCT 40.1 36.0 - 46.0 %   MCV 92.6 80.0 - 100.0 fL   MCH 31.6 26.0 - 34.0 pg   MCHC 34.2 30.0 - 36.0 g/dL    RDW 12.2 11.5 - 15.5 %   Platelets 154 150 - 400 K/uL   nRBC 0.0 0.0 - 0.2 %   Neutrophils Relative % 48 %   Neutro Abs 1.4 (L) 1.7 - 7.7 K/uL   Lymphocytes Relative 33 %   Lymphs Abs 0.9 0.7 - 4.0 K/uL   Monocytes Relative 18 %   Monocytes Absolute 0.5 0.1 - 1.0 K/uL   Eosinophils Relative 0 %   Eosinophils Absolute 0.0 0.0 - 0.5 K/uL   Basophils Relative 0 %   Basophils Absolute 0.0 0.0 - 0.1 K/uL   WBC Morphology MORPHOLOGY UNREMARKABLE    RBC Morphology MORPHOLOGY UNREMARKABLE    Immature Granulocytes 1 %   Abs Immature Granulocytes 0.02 0.00 - 0.07 K/uL    Comment: Performed at [facility], [address] [place], [place] [address]  Comprehensive metabolic panel     Status: Abnormal   Collection Time: 12/17/20  4:00 AM  Result Value Ref Range   Sodium 137 135 - 145 mmol/L   Potassium 4.3 3.5 - 5.1 mmol/L   Chloride 97 (L) 98 - 111 mmol/L   CO2 30 22 - 32 mmol/L   Glucose, Bld 117 (H) 70 - 99 mg/dL    Comment: Glucose reference range applies only to samples taken after fasting for at least 8 hours.  BUN 13 8 - 23 mg/dL   Creatinine, Ser 0.40 (L) 0.44 - 1.00 mg/dL   Calcium 8.8 (L) 8.9 - 10.3 mg/dL   Total Protein 6.1 (L) 6.5 - 8.1 g/dL   Albumin 3.3 (L) 3.5 - 5.0 g/dL   AST 39 15 - 41 U/L   ALT 21 0 - 44 U/L   Alkaline Phosphatase 42 38 - 126 U/L   Total Bilirubin 0.5 0.3 - 1.2 mg/dL   GFR, Estimated >60 >60 mL/min    Comment: (NOTE) Calculated using the CKD-EPI Creatinine Equation (2021)    Anion gap 10 5 - 15    Comment: Performed at [facility], [address] [place], [place] [address]  C-reactive protein     Status: Abnormal   Collection Time: 12/17/20  4:00 AM  Result Value Ref Range   CRP 1.3 (H) <1.0 mg/dL    Comment: Performed at [facility] [address], [place], [place] [address]  Ferritin     Status: Abnormal   Collection Time: 12/17/20  4:00 AM  Result Value Ref Range   Ferritin 437 (H) 11 - 307 ng/mL    Comment:  Performed at [facility], [address] [place], [place] [address]  Magnesium     Status: None   Collection Time: 12/17/20  4:00 AM  Result Value Ref Range   Magnesium 2.0 1.7 - 2.4 mg/dL    Comment: Performed at [facility], [address] [place], [place] [address]  Phosphorus     Status: None   Collection Time: 12/17/20  4:00 AM  Result Value Ref Range   Phosphorus 3.7 2.5 - 4.6 mg/dL    Comment: Performed at [facility], [address] [place], [place] [address]  D-dimer, quantitative (not at [facility])     Status: None   Collection Time: 12/17/20  4:00 AM  Result Value Ref Range   D-Dimer, Quant 0.40 0.00 - 0.50 ug/mL-FEU    Comment: (NOTE) At the manufacturer cut-off value of 0.5 g/mL FEU, this assay has a negative predictive value of 95-100%.This assay is intended for use in conjunction with a clinical pretest probability (PTP) assessment model to exclude pulmonary embolism (PE) and deep venous thrombosis (DVT) in outpatients suspected of PE or DVT. Results should be correlated with clinical presentation. Performed at [facility], [place] [address] [place], [place] [address]   CBC with Differential/Platelet     Status: None   Collection Time: 12/18/20  6:14 AM  Result Value Ref Range   WBC 4.1 4.0 - 10.5 K/uL   RBC 4.22 3.87 - 5.11 MIL/uL   Hemoglobin 13.2 12.0 - 15.0 g/dL   HCT 38.6 36.0 - 46.0 %   MCV 91.5 80.0 - 100.0 fL   MCH 31.3 26.0 - 34.0 pg   MCHC 34.2 30.0 - 36.0 g/dL   RDW 12.3 11.5 - 15.5 %   Platelets 188 150 - 400 K/uL   nRBC 0.0 0.0 - 0.2 %   Neutrophils Relative % 60 %   Neutro Abs 2.5 1.7 - 7.7 K/uL   Lymphocytes Relative 30 %   Lymphs Abs 1.3 0.7 - 4.0 K/uL   Monocytes Relative 9 %   Monocytes Absolute 0.4 0.1 - 1.0 K/uL   Eosinophils Relative 0 %   Eosinophils Absolute 0.0 0.0 - 0.5 K/uL   Basophils Relative 0 %   Basophils Absolute 0.0 0.0 - 0.1 K/uL   WBC Morphology MORPHOLOGY UNREMARKABLE  RBC  Morphology MORPHOLOGY UNREMARKABLE    Smear Review Normal platelet morphology    Immature Granulocytes 1 %   Abs Immature Granulocytes 0.02 0.00 - 0.07 K/uL    Comment: Performed at [facility], [address] [place], [place] [address]  Comprehensive metabolic panel     Status: Abnormal   Collection Time: 12/18/20  6:14 AM  Result Value Ref Range   Sodium 137 135 - 145 mmol/L   Potassium 3.9 3.5 - 5.1 mmol/L   Chloride 98 98 - 111 mmol/L   CO2 29 22 - 32 mmol/L   Glucose, Bld 140 (H) 70 - 99 mg/dL    Comment: Glucose reference range applies only to samples taken after fasting for at least 8 hours.   BUN 17 8 - 23 mg/dL   Creatinine, Ser 0.52 0.44 - 1.00 mg/dL   Calcium 8.6 (L) 8.9 - 10.3 mg/dL   Total Protein 5.7 (L) 6.5 - 8.1 g/dL   Albumin 3.1 (L) 3.5 - 5.0 g/dL   AST 33 15 - 41 U/L   ALT 22 0 - 44 U/L   Alkaline Phosphatase 43 38 - 126 U/L   Total Bilirubin 0.5 0.3 - 1.2 mg/dL   GFR, Estimated >60 >60 mL/min    Comment: (NOTE) Calculated using the CKD-EPI Creatinine Equation (2021)    Anion gap 10 5 - 15    Comment: Performed at [facility], [address] [place], [place] [address]  C-reactive protein     Status: None   Collection Time: 12/18/20  6:14 AM  Result Value Ref Range   CRP 0.7 <1.0 mg/dL    Comment: Performed at [facility] [address], [place], [place] [address]  Fibrin derivatives D-Dimer [facility] only)     Status: Abnormal   Collection Time: 12/18/20  6:14 AM  Result Value Ref Range   Fibrin derivatives D-dimer ([facility]) 545.32 (H) 0.00 - 499.00 ng/mL (FEU)    Comment: (NOTE) <> Exclusion of Venous Thromboembolism (VTE) - OUTPATIENT ONLY   (Emergency Department or [place])    0-499 ng/ml (FEU): With a low to intermediate pretest probability                      for VTE this test result excludes the diagnosis                      of VTE.   >499 ng/ml (FEU) : VTE not excluded; additional work up for VTE is                       required.  <> Testing on Inpatients and Evaluation of Disseminated Intravascular   Coagulation (DIC) Reference Range:   0-499 ng/ml (FEU) Performed at [facility], [name] [place], [place] [address]   Ferritin     Status: Abnormal   Collection Time: 12/18/20  6:14 AM  Result Value Ref Range   Ferritin 443 (H) 11 - 307 ng/mL    Comment: Performed at [facility], [address] [place], [place] [address]  Magnesium     Status: None   Collection Time: 12/18/20  6:14 AM  Result Value Ref Range   Magnesium 2.1 1.7 - 2.4 mg/dL    Comment: Performed at [facility], [address], [place], [place] [address]  Phosphorus     Status: None   Collection Time: 12/18/20  6:14 AM  Result Value Ref Range   Phosphorus 3.8 2.5 - 4.6 mg/dL    Comment: Performed at [facility], [address] [place], [place] [address]  CBC with Differential/Platelet     Status: None   Collection Time: 12/19/20  6:05 AM  Result Value Ref Range   WBC 4.9 4.0 - 10.5 K/uL   RBC 4.31 3.87 - 5.11 MIL/uL   Hemoglobin 13.6 12.0 - 15.0 g/dL   HCT 39.8 36.0 - 46.0 %   MCV 92.3 80.0 - 100.0 fL   MCH 31.6 26.0 - 34.0 pg   MCHC 34.2 30.0 - 36.0 g/dL   RDW 12.3 11.5 - 15.5 %   Platelets 236 150 - 400 K/uL   nRBC 0.0 0.0 - 0.2 %   Neutrophils Relative % 68 %   Neutro Abs 3.3 1.7 - 7.7 K/uL   Lymphocytes Relative 20 %   Lymphs Abs 1.0 0.7 - 4.0 K/uL   Monocytes Relative 12 %   Monocytes Absolute 0.6 0.1 - 1.0 K/uL   Eosinophils Relative 0 %   Eosinophils Absolute 0.0 0.0 - 0.5 K/uL   Basophils Relative 0 %   Basophils Absolute 0.0 0.0 - 0.1 K/uL   RBC Morphology MORPHOLOGY UNREMARKABLE    Smear Review Normal platelet morphology    Immature Granulocytes 0 %   Abs Immature Granulocytes 0.02 0.00 - 0.07 K/uL   Reactive, Benign Lymphocytes PRESENT     Comment: Performed at [facility], [address] [place], [place] [address]  Comprehensive metabolic  panel     Status: Abnormal   Collection Time: 12/19/20  6:05 AM  Result Value Ref Range   Sodium 140 135 - 145 mmol/L   Potassium 4.0 3.5 - 5.1 mmol/L   Chloride 99 98 - 111 mmol/L   CO2 32 22 - 32 mmol/L   Glucose, Bld 156 (H) 70 - 99 mg/dL    Comment: Glucose reference range applies only to samples taken after fasting for at least 8 hours.   BUN 20 8 - 23 mg/dL   Creatinine, Ser 0.54 0.44 - 1.00 mg/dL   Calcium 8.7 (L) 8.9 - 10.3 mg/dL   Total Protein 5.9 (L) 6.5 - 8.1 g/dL   Albumin 3.2 (L) 3.5 - 5.0 g/dL   AST 33 15 - 41 U/L   ALT 24 0 - 44 U/L   Alkaline Phosphatase 40 38 - 126 U/L   Total Bilirubin 0.5 0.3 - 1.2 mg/dL   GFR, Estimated >60 >60 mL/min    Comment: (NOTE) Calculated using the CKD-EPI Creatinine Equation (2021)    Anion gap 9 5 - 15    Comment: Performed at [facility], [address] [place], [place] [address]  C-reactive protein     Status: None   Collection Time: 12/19/20  6:05 AM  Result Value Ref Range   CRP 0.5 <1.0 mg/dL    Comment: Performed at [place] [facility] [address] [place], [place] [address]  Fibrin derivatives D-Dimer [facility] only)     Status: None   Collection Time: 12/19/20  6:05 AM  Result Value Ref Range   Fibrin derivatives D-dimer ([facility]) 397.02 0.00 - 499.00 ng/mL (FEU)    Comment: (NOTE) <> Exclusion of Venous Thromboembolism (VTE) - OUTPATIENT ONLY   (Emergency Department or [place])    0-499 ng/ml (FEU): With a low to intermediate pretest probability  for VTE this test result excludes the diagnosis                      of VTE.   >499 ng/ml (FEU) : VTE not excluded; additional work up for VTE is                      required.  <> Testing on Inpatients and Evaluation of Disseminated Intravascular   Coagulation (DIC) Reference Range:   0-499 ng/ml (FEU) Performed at [facility], [address] [place], [place] [address]   Ferritin     Status: Abnormal   Collection Time: 12/19/20   6:05 AM  Result Value Ref Range   Ferritin 471 (H) 11 - 307 ng/mL    Comment: Performed at [facility], [address] [place], [place] [address]  Magnesium     Status: None   Collection Time: 12/19/20  6:05 AM  Result Value Ref Range   Magnesium 2.4 1.7 - 2.4 mg/dL    Comment: Performed at [facility], [address] [place], [place] [address]  Phosphorus     Status: None   Collection Time: 12/19/20  6:05 AM  Result Value Ref Range   Phosphorus 3.7 2.5 - 4.6 mg/dL    Comment: Performed at [facility], [address] [place], [place] [address]  CBC with Differential/Platelet     Status: None   Collection Time: 12/20/20  4:37 AM  Result Value Ref Range   WBC 4.9 4.0 - 10.5 K/uL   RBC 4.66 3.87 - 5.11 MIL/uL   Hemoglobin 14.7 12.0 - 15.0 g/dL   HCT 44.4 36.0 - 46.0 %   MCV 95.3 80.0 - 100.0 fL   MCH 31.5 26.0 - 34.0 pg   MCHC 33.1 30.0 - 36.0 g/dL   RDW 12.3 11.5 - 15.5 %   Platelets 270 150 - 400 K/uL   nRBC 0.0 0.0 - 0.2 %   Neutrophils Relative % 73 %   Neutro Abs 3.6 1.7 - 7.7 K/uL   Lymphocytes Relative 20 %   Lymphs Abs 1.0 0.7 - 4.0 K/uL   Monocytes Relative 6 %   Monocytes Absolute 0.3 0.1 - 1.0 K/uL   Eosinophils Relative 0 %   Eosinophils Absolute 0.0 0.0 - 0.5 K/uL   Basophils Relative 0 %   Basophils Absolute 0.0 0.0 - 0.1 K/uL   WBC Morphology MORPHOLOGY UNREMARKABLE    RBC Morphology MORPHOLOGY UNREMARKABLE    Smear Review Normal platelet morphology    Immature Granulocytes 1 %   Abs Immature Granulocytes 0.04 0.00 - 0.07 K/uL    Comment: Performed at [facility], [address] [place], [place] [address]  Comprehensive metabolic panel     Status: Abnormal   Collection Time: 12/20/20  4:37 AM  Result Value Ref Range   Sodium 138 135 - 145 mmol/L   Potassium 4.0 3.5 - 5.1 mmol/L   Chloride 98 98 - 111 mmol/L   CO2 32 22 - 32 mmol/L   Glucose, Bld 187 (H) 70 - 99 mg/dL    Comment: Glucose reference range  applies only to samples taken after fasting for at least 8 hours.   BUN 21 8 - 23 mg/dL   Creatinine, Ser 0.44 0.44 - 1.00 mg/dL   Calcium 8.8 (L) 8.9 - 10.3 mg/dL   Total Protein 6.6 6.5 - 8.1 g/dL   Albumin 3.5 3.5 - 5.0 g/dL  AST 26 15 - 41 U/L   ALT 26 0 - 44 U/L   Alkaline Phosphatase 45 38 - 126 U/L   Total Bilirubin 0.7 0.3 - 1.2 mg/dL   GFR, Estimated >60 >60 mL/min    Comment: (NOTE) Calculated using the CKD-EPI Creatinine Equation (2021)    Anion gap 8 5 - 15    Comment: Performed at [facility], [address] [place], [place] [address]  C-reactive protein     Status: None   Collection Time: 12/20/20  4:37 AM  Result Value Ref Range   CRP <0.5 <1.0 mg/dL    Comment: Performed at [place] [address], [place], [place] [address]  Fibrin derivatives D-Dimer [facility] only)     Status: None   Collection Time: 12/20/20  4:37 AM  Result Value Ref Range   Fibrin derivatives D-dimer ([facility]) 283.77 0.00 - 499.00 ng/mL (FEU)    Comment: (NOTE) <> Exclusion of Venous Thromboembolism (VTE) - OUTPATIENT ONLY   (Emergency Department or [place])    0-499 ng/ml (FEU): With a low to intermediate pretest probability                      for VTE this test result excludes the diagnosis                      of VTE.   >499 ng/ml (FEU) : VTE not excluded; additional work up for VTE is                      required.  <> Testing on Inpatients and Evaluation of Disseminated Intravascular   Coagulation (DIC) Reference Range:   0-499 ng/ml (FEU) Performed at [facility], [address] [place], [place] [address]   Ferritin     Status: Abnormal   Collection Time: 12/20/20  4:37 AM  Result Value Ref Range   Ferritin 439 (H) 11 - 307 ng/mL    Comment: Performed at [facility], [address] [place], [place] [address]  Magnesium     Status: None   Collection Time: 12/20/20  4:37 AM  Result Value Ref Range   Magnesium 2.4 1.7 - 2.4 mg/dL     Comment: Performed at [facility], [address] [place], [place] [address]  Phosphorus     Status: None   Collection Time: 12/20/20  4:37 AM  Result Value Ref Range   Phosphorus 3.2 2.5 - 4.6 mg/dL    Comment: Performed at [facility], [address] [place],  [address]  Comprehensive metabolic panel     Status: Abnormal   Collection Time: 12/21/20  5:10 AM  Result Value Ref Range   Sodium 138 135 - 145 mmol/L   Potassium 4.2 3.5 - 5.1 mmol/L   Chloride 99 98 - 111 mmol/L   CO2 30 22 - 32 mmol/L   Glucose, Bld 142 (H) 70 - 99 mg/dL    Comment: Glucose reference range applies only to samples taken after fasting for at least 8 hours.   BUN 20 8 - 23 mg/dL   Creatinine, Ser 0.44 0.44 - 1.00 mg/dL   Calcium 8.3 (L) 8.9 - 10.3 mg/dL   Total Protein 5.3 (L) 6.5 - 8.1 g/dL   Albumin 3.0 (L) 3.5 - 5.0 g/dL   AST 19 15 - 41 U/L   ALT 22 0 - 44 U/L   Alkaline Phosphatase  36 (L) 38 - 126 U/L   Total Bilirubin 0.5 0.3 - 1.2 mg/dL   GFR, Estimated >60 >60 mL/min    Comment: (NOTE) Calculated using the CKD-EPI Creatinine Equation (2021)    Anion gap 9 5 - 15    Comment: Performed at [facility], [address] [place], [place] [address]  Ferritin     Status: Abnormal   Collection Time: 12/21/20  5:10 AM  Result Value Ref Range   Ferritin 318 (H) 11 - 307 ng/mL    Comment: Performed at [facility], [address] [place], [place] [address]  Comprehensive metabolic panel     Status: Abnormal   Collection Time: 12/22/20  4:54 AM  Result Value Ref Range   Sodium 140 135 - 145 mmol/L   Potassium 4.1 3.5 - 5.1 mmol/L   Chloride 101 98 - 111 mmol/L   CO2 31 22 - 32 mmol/L   Glucose, Bld 106 (H) 70 - 99 mg/dL    Comment: Glucose reference range applies only to samples taken after fasting for at least 8 hours.   BUN 25 (H) 8 - 23 mg/dL   Creatinine, Ser 0.44 0.44 - 1.00 mg/dL   Calcium 8.8 (L) 8.9 - 10.3 mg/dL   Total Protein 6.2 (L)  6.5 - 8.1 g/dL   Albumin 3.4 (L) 3.5 - 5.0 g/dL   AST 20 15 - 41 U/L   ALT 25 0 - 44 U/L   Alkaline Phosphatase 39 38 - 126 U/L   Total Bilirubin 0.7 0.3 - 1.2 mg/dL   GFR, Estimated >60 >60 mL/min    Comment: (NOTE) Calculated using the CKD-EPI Creatinine Equation (2021)    Anion gap 8 5 - 15    Comment: Performed at [facility], [address] [place], [place] [address]  Creatinine, serum     Status: None   Collection Time: 12/23/20  7:11 AM  Result Value Ref Range   Creatinine, Ser 0.51 0.44 - 1.00 mg/dL   GFR, Estimated >60 >60 mL/min    Comment: (NOTE) Calculated using the CKD-EPI Creatinine Equation (2021) Performed at [facility], [address] [place], [place] [address]   C-reactive protein     Status: None   Collection Time: 12/23/20  7:11 AM  Result Value Ref Range   CRP 0.6 <1.0 mg/dL    Comment: Performed at [facility], [place] [address] [place],  [address]  Ferritin     Status: Abnormal   Collection Time: 12/23/20  7:11 AM  Result Value Ref Range   Ferritin 368 (H) 11 - 307 ng/mL    Comment: Performed at [facility], [place],  [address]  Fibrin derivatives D-Dimer [facility] only)     Status: None   Collection Time: 12/23/20  7:11 AM  Result Value Ref Range   Fibrin derivatives D-dimer ([facility]) 278.75 0.00 - 499.00 ng/mL (FEU)    Comment: (NOTE) <> Exclusion of Venous Thromboembolism (VTE) - OUTPATIENT ONLY   (Emergency Department or [place])    0-499 ng/ml (FEU): With a low to intermediate pretest probability                      for VTE this test result excludes the diagnosis                      of VTE.   >499 ng/ml (FEU) : VTE not excluded; additional  work up for VTE is                      required.  <> Testing on Inpatients and Evaluation of Disseminated Intravascular   Coagulation (DIC) Reference Range:   0-499 ng/ml (FEU) Performed at [facility], [address]  [place], [place] [address]     -------------------------------------------------------------------------- A&P:  Problem List Items Addressed This Visit    Centrilobular emphysema ([place])    Other Visit Diagnoses    COVID-19 virus infection    -  Primary   Relevant Orders   SARS-CoV-2 Semi-Quantitative Total Antibody, Spike     COVID19 hospitalization, now recovering COPD / smoker - risk factor Improved overall, on steroid course, now finished CXR showed patchy infiltrate likely covid19 pneumonia Now afebrile Counseling on symptom management No new rx at this time, use current med on discharge from hospital and mucinex Return 2-3 weeks for COVID antibody level to check immunity Can check CXR in future if unresolved Follow-up in future  No orders of the defined types were placed in this encounter.   Follow-up: - Return in 2-4 weeks for COVID antibody test blood draw, check immunity for vaccine and follow-up in person for hospital follow-up COPD / COVID  Patient verbalizes understanding with the above medical recommendations including the limitation of remote medical advice.  Specific follow-up and call-back criteria were given for patient to follow-up or seek medical care more urgently if needed.   - Time spent in direct consultation with patient on phone: 15 minutes  Nobie Putnam, [facility] 01/03/2021, 11:46 AM

## 2021-01-03 NOTE — Telephone Encounter (Signed)
Brandi [name], from [facility], calling stating that the pt is needing to reschedule pts PT for 01/06/21. He states that the pts husband passed away and she could not make her visit today. Please advise.     [phone]

## 2021-01-03 NOTE — Patient Instructions (Signed)
° °  Please schedule a Follow-up Appointment to: No follow-ups on file. ° °If you have any other questions or concerns, please feel free to call the office or send a message through MyChart. You may also schedule an earlier appointment if necessary. ° °Additionally, you may be receiving a survey about your experience at our office within a few days to 1 week by e-mail or mail. We value your feedback. ° °Berenise Hunton, DO °[facility], [facility] °

## 2021-01-03 NOTE — Telephone Encounter (Signed)
They have been notified that it is ok to reschedule.

## 2021-01-03 NOTE — Telephone Encounter (Signed)
Please let them know  Ok to reschedule PT apt.  Thank you  [name], DO [facility] 01/03/2021, 2:49 PM

## 2021-01-06 DIAGNOSIS — Z7952 Long term (current) use of systemic steroids: Secondary | ICD-10-CM | POA: Diagnosis not present

## 2021-01-06 DIAGNOSIS — U071 COVID-19: Secondary | ICD-10-CM | POA: Diagnosis not present

## 2021-01-06 DIAGNOSIS — R000 Tachycardia, unspecified: Secondary | ICD-10-CM | POA: Diagnosis not present

## 2021-01-06 DIAGNOSIS — J1282 Pneumonia due to coronavirus disease 2019: Secondary | ICD-10-CM | POA: Diagnosis not present

## 2021-01-06 DIAGNOSIS — D696 Thrombocytopenia, unspecified: Secondary | ICD-10-CM | POA: Diagnosis not present

## 2021-01-06 DIAGNOSIS — E785 Hyperlipidemia, unspecified: Secondary | ICD-10-CM | POA: Diagnosis not present

## 2021-01-06 DIAGNOSIS — E871 Hypo-osmolality and hyponatremia: Secondary | ICD-10-CM | POA: Diagnosis not present

## 2021-01-06 DIAGNOSIS — Z9981 Dependence on supplemental oxygen: Secondary | ICD-10-CM | POA: Diagnosis not present

## 2021-01-06 DIAGNOSIS — J9601 Acute respiratory failure with hypoxia: Secondary | ICD-10-CM | POA: Diagnosis not present

## 2021-01-09 DIAGNOSIS — U071 COVID-19: Secondary | ICD-10-CM | POA: Diagnosis not present

## 2021-01-09 DIAGNOSIS — J1282 Pneumonia due to coronavirus disease 2019: Secondary | ICD-10-CM | POA: Diagnosis not present

## 2021-01-09 DIAGNOSIS — E871 Hypo-osmolality and hyponatremia: Secondary | ICD-10-CM | POA: Diagnosis not present

## 2021-01-09 DIAGNOSIS — R000 Tachycardia, unspecified: Secondary | ICD-10-CM | POA: Diagnosis not present

## 2021-01-09 DIAGNOSIS — J9601 Acute respiratory failure with hypoxia: Secondary | ICD-10-CM | POA: Diagnosis not present

## 2021-01-09 DIAGNOSIS — Z9981 Dependence on supplemental oxygen: Secondary | ICD-10-CM | POA: Diagnosis not present

## 2021-01-09 DIAGNOSIS — D696 Thrombocytopenia, unspecified: Secondary | ICD-10-CM | POA: Diagnosis not present

## 2021-01-09 DIAGNOSIS — E785 Hyperlipidemia, unspecified: Secondary | ICD-10-CM | POA: Diagnosis not present

## 2021-01-09 DIAGNOSIS — Z7952 Long term (current) use of systemic steroids: Secondary | ICD-10-CM | POA: Diagnosis not present

## 2021-01-10 DIAGNOSIS — J9601 Acute respiratory failure with hypoxia: Secondary | ICD-10-CM | POA: Diagnosis not present

## 2021-01-10 DIAGNOSIS — R000 Tachycardia, unspecified: Secondary | ICD-10-CM | POA: Diagnosis not present

## 2021-01-10 DIAGNOSIS — Z9981 Dependence on supplemental oxygen: Secondary | ICD-10-CM | POA: Diagnosis not present

## 2021-01-10 DIAGNOSIS — U071 COVID-19: Secondary | ICD-10-CM | POA: Diagnosis not present

## 2021-01-10 DIAGNOSIS — E785 Hyperlipidemia, unspecified: Secondary | ICD-10-CM | POA: Diagnosis not present

## 2021-01-10 DIAGNOSIS — J1282 Pneumonia due to coronavirus disease 2019: Secondary | ICD-10-CM | POA: Diagnosis not present

## 2021-01-10 DIAGNOSIS — D696 Thrombocytopenia, unspecified: Secondary | ICD-10-CM | POA: Diagnosis not present

## 2021-01-10 DIAGNOSIS — Z7952 Long term (current) use of systemic steroids: Secondary | ICD-10-CM | POA: Diagnosis not present

## 2021-01-10 DIAGNOSIS — E871 Hypo-osmolality and hyponatremia: Secondary | ICD-10-CM | POA: Diagnosis not present

## 2021-01-14 DIAGNOSIS — J1282 Pneumonia due to coronavirus disease 2019: Secondary | ICD-10-CM | POA: Diagnosis not present

## 2021-01-14 DIAGNOSIS — J9601 Acute respiratory failure with hypoxia: Secondary | ICD-10-CM | POA: Diagnosis not present

## 2021-01-14 DIAGNOSIS — R000 Tachycardia, unspecified: Secondary | ICD-10-CM | POA: Diagnosis not present

## 2021-01-14 DIAGNOSIS — E785 Hyperlipidemia, unspecified: Secondary | ICD-10-CM | POA: Diagnosis not present

## 2021-01-14 DIAGNOSIS — Z7952 Long term (current) use of systemic steroids: Secondary | ICD-10-CM | POA: Diagnosis not present

## 2021-01-14 DIAGNOSIS — Z9981 Dependence on supplemental oxygen: Secondary | ICD-10-CM | POA: Diagnosis not present

## 2021-01-14 DIAGNOSIS — D696 Thrombocytopenia, unspecified: Secondary | ICD-10-CM | POA: Diagnosis not present

## 2021-01-14 DIAGNOSIS — E871 Hypo-osmolality and hyponatremia: Secondary | ICD-10-CM | POA: Diagnosis not present

## 2021-01-14 DIAGNOSIS — U071 COVID-19: Secondary | ICD-10-CM | POA: Diagnosis not present

## 2021-01-16 DIAGNOSIS — Z9981 Dependence on supplemental oxygen: Secondary | ICD-10-CM | POA: Diagnosis not present

## 2021-01-16 DIAGNOSIS — J1282 Pneumonia due to coronavirus disease 2019: Secondary | ICD-10-CM | POA: Diagnosis not present

## 2021-01-16 DIAGNOSIS — D696 Thrombocytopenia, unspecified: Secondary | ICD-10-CM | POA: Diagnosis not present

## 2021-01-16 DIAGNOSIS — E871 Hypo-osmolality and hyponatremia: Secondary | ICD-10-CM | POA: Diagnosis not present

## 2021-01-16 DIAGNOSIS — U071 COVID-19: Secondary | ICD-10-CM | POA: Diagnosis not present

## 2021-01-16 DIAGNOSIS — Z7952 Long term (current) use of systemic steroids: Secondary | ICD-10-CM | POA: Diagnosis not present

## 2021-01-16 DIAGNOSIS — E785 Hyperlipidemia, unspecified: Secondary | ICD-10-CM | POA: Diagnosis not present

## 2021-01-16 DIAGNOSIS — J9601 Acute respiratory failure with hypoxia: Secondary | ICD-10-CM | POA: Diagnosis not present

## 2021-01-16 DIAGNOSIS — R000 Tachycardia, unspecified: Secondary | ICD-10-CM | POA: Diagnosis not present

## 2021-01-17 ENCOUNTER — Other Ambulatory Visit: Payer: Self-pay | Admitting: *Deleted

## 2021-01-17 DIAGNOSIS — U071 COVID-19: Secondary | ICD-10-CM

## 2021-01-17 DIAGNOSIS — Z1159 Encounter for screening for other viral diseases: Secondary | ICD-10-CM

## 2021-01-17 DIAGNOSIS — J432 Centrilobular emphysema: Secondary | ICD-10-CM

## 2021-01-17 DIAGNOSIS — Z0000 Encounter for general adult medical examination without abnormal findings: Secondary | ICD-10-CM

## 2021-01-17 DIAGNOSIS — E782 Mixed hyperlipidemia: Secondary | ICD-10-CM

## 2021-01-17 DIAGNOSIS — E559 Vitamin D deficiency, unspecified: Secondary | ICD-10-CM

## 2021-01-17 DIAGNOSIS — I700 Atherosclerosis of aorta: Secondary | ICD-10-CM

## 2021-01-17 DIAGNOSIS — R7309 Other abnormal glucose: Secondary | ICD-10-CM

## 2021-01-20 ENCOUNTER — Other Ambulatory Visit: Payer: Medicare HMO

## 2021-01-20 ENCOUNTER — Other Ambulatory Visit: Payer: Self-pay

## 2021-01-20 DIAGNOSIS — R7309 Other abnormal glucose: Secondary | ICD-10-CM | POA: Diagnosis not present

## 2021-01-20 DIAGNOSIS — I700 Atherosclerosis of aorta: Secondary | ICD-10-CM | POA: Diagnosis not present

## 2021-01-20 DIAGNOSIS — Z0000 Encounter for general adult medical examination without abnormal findings: Secondary | ICD-10-CM | POA: Diagnosis not present

## 2021-01-20 DIAGNOSIS — E782 Mixed hyperlipidemia: Secondary | ICD-10-CM | POA: Diagnosis not present

## 2021-01-20 DIAGNOSIS — Z1159 Encounter for screening for other viral diseases: Secondary | ICD-10-CM | POA: Diagnosis not present

## 2021-01-20 DIAGNOSIS — E559 Vitamin D deficiency, unspecified: Secondary | ICD-10-CM | POA: Diagnosis not present

## 2021-01-20 DIAGNOSIS — J432 Centrilobular emphysema: Secondary | ICD-10-CM | POA: Diagnosis not present

## 2021-01-20 DIAGNOSIS — U071 COVID-19: Secondary | ICD-10-CM | POA: Diagnosis not present

## 2021-01-21 DIAGNOSIS — E785 Hyperlipidemia, unspecified: Secondary | ICD-10-CM | POA: Diagnosis not present

## 2021-01-21 DIAGNOSIS — Z7952 Long term (current) use of systemic steroids: Secondary | ICD-10-CM | POA: Diagnosis not present

## 2021-01-21 DIAGNOSIS — J9601 Acute respiratory failure with hypoxia: Secondary | ICD-10-CM | POA: Diagnosis not present

## 2021-01-21 DIAGNOSIS — J1282 Pneumonia due to coronavirus disease 2019: Secondary | ICD-10-CM | POA: Diagnosis not present

## 2021-01-21 DIAGNOSIS — U071 COVID-19: Secondary | ICD-10-CM | POA: Diagnosis not present

## 2021-01-21 DIAGNOSIS — E871 Hypo-osmolality and hyponatremia: Secondary | ICD-10-CM | POA: Diagnosis not present

## 2021-01-21 DIAGNOSIS — Z9981 Dependence on supplemental oxygen: Secondary | ICD-10-CM | POA: Diagnosis not present

## 2021-01-21 DIAGNOSIS — R000 Tachycardia, unspecified: Secondary | ICD-10-CM | POA: Diagnosis not present

## 2021-01-21 DIAGNOSIS — D696 Thrombocytopenia, unspecified: Secondary | ICD-10-CM | POA: Diagnosis not present

## 2021-01-22 ENCOUNTER — Other Ambulatory Visit: Payer: Self-pay

## 2021-01-22 ENCOUNTER — Encounter: Payer: Self-pay | Admitting: Family Medicine

## 2021-01-22 ENCOUNTER — Ambulatory Visit (INDEPENDENT_AMBULATORY_CARE_PROVIDER_SITE_OTHER): Payer: Medicare HMO | Admitting: Family Medicine

## 2021-01-22 VITALS — BP 101/64 | HR 94 | Temp 97.8°F | Resp 20 | Ht 64.0 in | Wt 118.0 lb

## 2021-01-22 DIAGNOSIS — R7309 Other abnormal glucose: Secondary | ICD-10-CM | POA: Diagnosis not present

## 2021-01-22 DIAGNOSIS — K047 Periapical abscess without sinus: Secondary | ICD-10-CM | POA: Diagnosis not present

## 2021-01-22 DIAGNOSIS — U099 Post covid-19 condition, unspecified: Secondary | ICD-10-CM

## 2021-01-22 DIAGNOSIS — J432 Centrilobular emphysema: Secondary | ICD-10-CM | POA: Diagnosis not present

## 2021-01-22 MED ORDER — AMOXICILLIN-POT CLAVULANATE 875-125 MG PO TABS: 1.0000 | ORAL_TABLET | Freq: Two times a day (BID) | ORAL | 0 refills | Status: DC

## 2021-01-22 NOTE — Progress Notes (Signed)
Subjective:    Patient ID: Brandi [name], female    DOB: 1956-06-24, 65 y.o.   MRN: [id number]  Brandi [name] is a 65 y.o. female presenting on 01/22/2021 for COPD (The pt doesn't know the difference between her inhalers. She reports having a green inhaler and the other one is red. She use them both PRN. ) and Post Covid (Pt still undergoing PT and skill nursing post COVID )   HPI   POST COVID19 Syndrome Recent illness, reviewed last time in hospital follow-up She was on oxygen previously, still has it but no longer needed. She has used it only PRN for anxiety at times. Briefly only She has completed HH PT, and last day for PT was yesterday. She is able to walk distance without dropping oxygen. She has HH RN Cough has resolved - she was treated by dentist with Amoxicillin for dental infection but could not have time to get back to dentist said it improved sinuses and overall improved but did not fully resolve jaw issue. Request one more round of antibiotics  Elevated A1c Last lab showed A1c 5.8, admits recent stressors with loss of husband and covid illness No prior history of prediabetes or diabetes  VItamin D Deficiency Last lab Vitamin D 22.  Centrilobular Emphysema (COPD) She has improved. See above Using Albuterol PRN not using often. Has other inhaler not sure which using more often.    Depression screen PHQ 2/9 04/08/2020  Decreased Interest 0  Down, Depressed, Hopeless 0  PHQ - 2 Score 0    Social History   Tobacco Use  . Smoking status: Former Smoker    Packs/day: 0.75    Years: 41.00    Pack years: 30.75    Types: E-cigarettes, Cigarettes    Quit date: 12/08/2015    Years since quitting: 5.1  . Smokeless tobacco: Former Clinical biochemist  . Vaping Use: Never used  Substance Use Topics  . Alcohol use: No  . Drug use: No    Review of Systems Per HPI unless specifically indicated above     Objective:    BP 101/64 (BP Location: Left Arm, Patient Position:  Sitting, Cuff Size: Normal)   Pulse 94   Temp 97.8 F (36.6 C) (Temporal)   Resp 20   Ht 5\' 4"  (1.626 m)   Wt 118 lb (53.5 kg)   SpO2 99%   BMI 20.25 kg/m   Wt Readings from Last 3 Encounters:  01/22/21 118 lb (53.5 kg)  01/03/21 120 lb (54.4 kg)  12/16/20 119 lb 4.3 oz (54.1 kg)    Physical Exam Vitals and nursing note reviewed.  Constitutional:      General: She is not in acute distress.    Appearance: She is well-developed and well-nourished. She is not diaphoretic.     Comments: Well-appearing, comfortable, cooperative  HENT:     Head: Normocephalic and atraumatic.     Mouth/Throat:     Mouth: Oropharynx is clear and moist.  Eyes:     General:        Right eye: No discharge.        Left eye: No discharge.     Conjunctiva/sclera: Conjunctivae normal.  Cardiovascular:     Rate and Rhythm: Normal rate.  Pulmonary:     Effort: Pulmonary effort is normal. No respiratory distress.     Breath sounds: Normal breath sounds. No wheezing, rhonchi or rales.  Musculoskeletal:        General: No edema.  Skin:    General: Skin is warm and dry.     Findings: No erythema or rash.  Neurological:     Mental Status: She is alert and oriented to person, place, and time.  Psychiatric:        Mood and Affect: Mood and affect normal.        Behavior: Behavior normal.     Comments: Well groomed, good eye contact, normal speech and thoughts    Results for orders placed or performed in visit on 01/17/21  VITAMIN D 25 Hydroxy (Vit-D Deficiency, Fractures)  Result Value Ref Range   Vit D, 25-Hydroxy 22 (L) 30 - 100 ng/mL  Hepatitis C antibody  Result Value Ref Range   Hepatitis C Ab NON-REACTIVE NON-REACTI   SIGNAL TO CUT-OFF 0.02 <1.00  Lipid panel  Result Value Ref Range   Cholesterol 188 <200 mg/dL   HDL 61 > OR = 50 mg/dL   Triglycerides 73 <706 mg/dL   LDL Cholesterol (Calc) 111 (H) mg/dL (calc)   Total CHOL/HDL Ratio 3.1 <5.0 (calc)   Non-HDL Cholesterol (Calc) 127 <130  mg/dL (calc)  COMPLETE METABOLIC PANEL WITH GFR  Result Value Ref Range   Glucose, Bld 90 65 - 99 mg/dL   BUN 7 7 - 25 mg/dL   Creat 2.37 6.28 - 3.15 mg/dL   GFR, Est Non African American 97 > OR = 60 mL/min/1.78m2   GFR, Est African American 112 > OR = 60 mL/min/1.12m2   BUN/Creatinine Ratio NOT APPLICABLE 6 - 22 (calc)   Sodium 142 135 - 146 mmol/L   Potassium 4.1 3.5 - 5.3 mmol/L   Chloride 106 98 - 110 mmol/L   CO2 30 20 - 32 mmol/L   Calcium 9.1 8.6 - 10.4 mg/dL   Total Protein 6.1 6.1 - 8.1 g/dL   Albumin 3.8 3.6 - 5.1 g/dL   Globulin 2.3 1.9 - 3.7 g/dL (calc)   AG Ratio 1.7 1.0 - 2.5 (calc)   Total Bilirubin 0.4 0.2 - 1.2 mg/dL   Alkaline phosphatase (APISO) 53 37 - 153 U/L   AST 17 10 - 35 U/L   ALT 15 6 - 29 U/L  CBC with Differential/Platelet  Result Value Ref Range   WBC 6.3 3.8 - 10.8 Thousand/uL   RBC 3.85 3.80 - 5.10 Million/uL   Hemoglobin 12.1 11.7 - 15.5 g/dL   HCT 17.6 16.0 - 73.7 %   MCV 94.0 80.0 - 100.0 fL   MCH 31.4 27.0 - 33.0 pg   MCHC 33.4 32.0 - 36.0 g/dL   RDW 10.6 26.9 - 48.5 %   Platelets 475 (H) 140 - 400 Thousand/uL   MPV 9.0 7.5 - 12.5 fL   Neutro Abs 3,043 1,500 - 7,800 cells/uL   Lymphs Abs 2,262 850 - 3,900 cells/uL   Absolute Monocytes 706 200 - 950 cells/uL   Eosinophils Absolute 189 15 - 500 cells/uL   Basophils Absolute 101 0 - 200 cells/uL   Neutrophils Relative % 48.3 %   Total Lymphocyte 35.9 %   Monocytes Relative 11.2 %   Eosinophils Relative 3.0 %   Basophils Relative 1.6 %  Hemoglobin A1c  Result Value Ref Range   Hgb A1c MFr Bld 5.8 (H) <5.7 % of total Hgb   Mean Plasma Glucose 120 mg/dL   eAG (mmol/L) 6.6 mmol/L      Assessment & Plan:   Problem List Items Addressed This Visit    Centrilobular emphysema (HCC)    Other  Visit Diagnoses    Post-COVID-19 syndrome    -  Primary   Dental infection       Relevant Medications   amoxicillin-clavulanate (AUGMENTIN) 875-125 MG tablet   Elevated hemoglobin A1c           #Dental infection Followed by Dentist, could not return to them Repeat course antibiotic, Augmentin 7 days  #Post COVID #COPD Notable improvement Now mostly off of supplemental oxygen, 99% Pulse ox on room air at rest. Uses it occasionally with activity only Continue HH RN She has completed HH PT Likely setback with COPD underlying, but now improved Continue Albuterol PRN, counseling today, she should use maintenance therapy, bring inhaler or send info to [place] if need refill.  #Elevated A1c Follow-up as planned with A1c in future 2-3 month  Meds ordered this encounter  Medications  . amoxicillin-clavulanate (AUGMENTIN) 875-125 MG tablet    Sig: Take 1 tablet by mouth 2 (two) times daily. For 7 days    Dispense:  14 tablet    Refill:  0      Follow up plan: Return in about 3 months (around 04/21/2021) for 3 month follow-up COPD, post covid.   [name], DO [facility] 01/22/2021, 2:00 PM

## 2021-01-22 NOTE — Patient Instructions (Addendum)
Thank you for coming to the office today.  COVID Antibody Test >1000 means good antibody immunity < 1000 is lower, < 500 is concerning for at risk again >2500 is excellent several months or more of protection  Call us by Monday if not heard yet on antibody test result  Low Vitamin D Start OTC Vitamin D3 5,000 iu daily for 12 weeks then reduce to OTC Vitamin D3 2,000 iu daily for maintenance Recommend VItamin C and Elderberry for immunity health  Albuterol (red for rescue) use it as needed 2 puffs when you need short of breath, cough, wheezing.  Please schedule a Follow-up Appointment to: Return in about 3 months (around 04/21/2021) for 3 month follow-up COPD, post covid.  If you have any other questions or concerns, please feel free to call the office or send a message through MyChart. You may also schedule an earlier appointment if necessary.  Additionally, you may be receiving a survey about your experience at our office within a few days to 1 week by e-mail or mail. We value your feedback.  [name], DO [facility], [place]

## 2021-01-23 LAB — LIPID PANEL
Cholesterol: 188 mg/dL (ref <200)
HDL: 61 mg/dL (ref >=50)
LDL Cholesterol (Calc): 111 mg/dL (calc) — ABNORMAL HIGH
Non-HDL Cholesterol (Calc): 127 mg/dL (calc) (ref <130)
Total CHOL/HDL Ratio: 3.1 (calc) (ref <5.0)
Triglycerides: 73 mg/dL (ref <150)

## 2021-01-23 LAB — HEMOGLOBIN A1C
Hgb A1c MFr Bld: 5.8 % of total Hgb — ABNORMAL HIGH (ref <5.7)
Mean Plasma Glucose: 120 mg/dL
eAG (mmol/L): 6.6 mmol/L

## 2021-01-23 LAB — COMPLETE METABOLIC PANEL WITH GFR
AG Ratio: 1.7 (calc) (ref 1.0–2.5)
ALT: 15 U/L (ref 6–29)
AST: 17 U/L (ref 10–35)
Albumin: 3.8 g/dL (ref 3.6–5.1)
Alkaline phosphatase (APISO): 53 U/L (ref 37–153)
BUN: 7 mg/dL (ref 7–25)
CO2: 30 mmol/L (ref 20–32)
Calcium: 9.1 mg/dL (ref 8.6–10.4)
Chloride: 106 mmol/L (ref 98–110)
Creat: 0.58 mg/dL (ref 0.50–0.99)
GFR, Est African American: 112 mL/min/{1.73_m2} (ref >=60)
GFR, Est Non African American: 97 mL/min/{1.73_m2} (ref >=60)
Globulin: 2.3 g/dL (calc) (ref 1.9–3.7)
Glucose, Bld: 90 mg/dL (ref 65–99)
Potassium: 4.1 mmol/L (ref 3.5–5.3)
Sodium: 142 mmol/L (ref 135–146)
Total Bilirubin: 0.4 mg/dL (ref 0.2–1.2)
Total Protein: 6.1 g/dL (ref 6.1–8.1)

## 2021-01-23 LAB — CBC WITH DIFFERENTIAL/PLATELET
Absolute Monocytes: 706 cells/uL (ref 200–950)
Basophils Absolute: 101 cells/uL (ref 0–200)
Basophils Relative: 1.6 %
Eosinophils Absolute: 189 cells/uL (ref 15–500)
Eosinophils Relative: 3 %
HCT: 36.2 % (ref 35.0–45.0)
Hemoglobin: 12.1 g/dL (ref 11.7–15.5)
Lymphs Abs: 2262 cells/uL (ref 850–3900)
MCH: 31.4 pg (ref 27.0–33.0)
MCHC: 33.4 g/dL (ref 32.0–36.0)
MCV: 94 fL (ref 80.0–100.0)
MPV: 9 fL (ref 7.5–12.5)
Monocytes Relative: 11.2 %
Neutro Abs: 3043 cells/uL (ref 1500–7800)
Neutrophils Relative %: 48.3 %
Platelets: 475 10*3/uL — ABNORMAL HIGH (ref 140–400)
RBC: 3.85 10*6/uL (ref 3.80–5.10)
RDW: 12.4 % (ref 11.0–15.0)
Total Lymphocyte: 35.9 %
WBC: 6.3 10*3/uL (ref 3.8–10.8)

## 2021-01-23 LAB — SARS-COV-2 SEMI-QUANTITATIVE TOTAL ANTIBODY, SPIKE: SARS COV2 AB, Total Spike Semi QN: 308 U/mL — ABNORMAL HIGH (ref <0.8)

## 2021-01-23 LAB — HEPATITIS C ANTIBODY
Hepatitis C Ab: NONREACTIVE
SIGNAL TO CUT-OFF: 0.02 (ref <1.00)

## 2021-01-23 LAB — VITAMIN D 25 HYDROXY (VIT D DEFICIENCY, FRACTURES): Vit D, 25-Hydroxy: 22 ng/mL — ABNORMAL LOW (ref 30–100)

## 2021-01-28 ENCOUNTER — Telehealth: Payer: Self-pay

## 2021-01-28 NOTE — Telephone Encounter (Signed)
Pt notified... She is asking is there anything she can do to build up the immunity to COVID?

## 2021-01-28 NOTE — Telephone Encounter (Signed)
COVID19 vaccination is main option to improve immunity.  Pfizer or [place], 2 dose series and booster then in 6 months if eligible.  [name], DO [facility] 01/28/2021, 4:31 PM

## 2021-01-28 NOTE — Telephone Encounter (Signed)
Pt was in  Office requesting her results on her antibody test

## 2021-01-28 NOTE — Telephone Encounter (Signed)
This should have already been resulted back on 2/17.  Here is copy:  Please notify patient.  COVID antibody result is 308  This is a low antibody number. She is actually at risk in future of COVID again. Typically in patients who have had COVID vaccine and had COVID infection we see much much higher levels of antibody.  Brandi Pilar, DO [facility] 01/28/2021, 1:09 PM

## 2021-01-28 NOTE — Telephone Encounter (Signed)
Please advise.. I can call the pt and go over her recent results.

## 2021-01-29 DIAGNOSIS — E871 Hypo-osmolality and hyponatremia: Secondary | ICD-10-CM | POA: Diagnosis not present

## 2021-01-29 DIAGNOSIS — D696 Thrombocytopenia, unspecified: Secondary | ICD-10-CM | POA: Diagnosis not present

## 2021-01-29 DIAGNOSIS — Z9981 Dependence on supplemental oxygen: Secondary | ICD-10-CM | POA: Diagnosis not present

## 2021-01-29 DIAGNOSIS — E785 Hyperlipidemia, unspecified: Secondary | ICD-10-CM | POA: Diagnosis not present

## 2021-01-29 DIAGNOSIS — Z7952 Long term (current) use of systemic steroids: Secondary | ICD-10-CM | POA: Diagnosis not present

## 2021-01-29 DIAGNOSIS — J1282 Pneumonia due to coronavirus disease 2019: Secondary | ICD-10-CM | POA: Diagnosis not present

## 2021-01-29 DIAGNOSIS — U071 COVID-19: Secondary | ICD-10-CM | POA: Diagnosis not present

## 2021-01-29 DIAGNOSIS — R000 Tachycardia, unspecified: Secondary | ICD-10-CM | POA: Diagnosis not present

## 2021-01-29 DIAGNOSIS — J9601 Acute respiratory failure with hypoxia: Secondary | ICD-10-CM | POA: Diagnosis not present

## 2021-02-04 DIAGNOSIS — J9601 Acute respiratory failure with hypoxia: Secondary | ICD-10-CM | POA: Diagnosis not present

## 2021-02-04 DIAGNOSIS — U071 COVID-19: Secondary | ICD-10-CM | POA: Diagnosis not present

## 2021-02-06 DIAGNOSIS — J1282 Pneumonia due to coronavirus disease 2019: Secondary | ICD-10-CM | POA: Diagnosis not present

## 2021-02-06 DIAGNOSIS — Z9981 Dependence on supplemental oxygen: Secondary | ICD-10-CM | POA: Diagnosis not present

## 2021-02-06 DIAGNOSIS — E871 Hypo-osmolality and hyponatremia: Secondary | ICD-10-CM | POA: Diagnosis not present

## 2021-02-06 DIAGNOSIS — Z1211 Encounter for screening for malignant neoplasm of colon: Secondary | ICD-10-CM | POA: Diagnosis not present

## 2021-02-06 DIAGNOSIS — R000 Tachycardia, unspecified: Secondary | ICD-10-CM | POA: Diagnosis not present

## 2021-02-06 DIAGNOSIS — E785 Hyperlipidemia, unspecified: Secondary | ICD-10-CM | POA: Diagnosis not present

## 2021-02-06 DIAGNOSIS — U071 COVID-19: Secondary | ICD-10-CM | POA: Diagnosis not present

## 2021-02-06 DIAGNOSIS — D696 Thrombocytopenia, unspecified: Secondary | ICD-10-CM | POA: Diagnosis not present

## 2021-02-06 DIAGNOSIS — Z7952 Long term (current) use of systemic steroids: Secondary | ICD-10-CM | POA: Diagnosis not present

## 2021-02-06 DIAGNOSIS — Z1212 Encounter for screening for malignant neoplasm of rectum: Secondary | ICD-10-CM | POA: Diagnosis not present

## 2021-02-06 DIAGNOSIS — J9601 Acute respiratory failure with hypoxia: Secondary | ICD-10-CM | POA: Diagnosis not present

## 2021-02-13 DIAGNOSIS — E871 Hypo-osmolality and hyponatremia: Secondary | ICD-10-CM | POA: Diagnosis not present

## 2021-02-13 DIAGNOSIS — J9601 Acute respiratory failure with hypoxia: Secondary | ICD-10-CM | POA: Diagnosis not present

## 2021-02-13 DIAGNOSIS — R000 Tachycardia, unspecified: Secondary | ICD-10-CM | POA: Diagnosis not present

## 2021-02-13 DIAGNOSIS — D696 Thrombocytopenia, unspecified: Secondary | ICD-10-CM | POA: Diagnosis not present

## 2021-02-13 DIAGNOSIS — E785 Hyperlipidemia, unspecified: Secondary | ICD-10-CM | POA: Diagnosis not present

## 2021-02-13 DIAGNOSIS — Z7952 Long term (current) use of systemic steroids: Secondary | ICD-10-CM | POA: Diagnosis not present

## 2021-02-13 DIAGNOSIS — U071 COVID-19: Secondary | ICD-10-CM | POA: Diagnosis not present

## 2021-02-13 DIAGNOSIS — J1282 Pneumonia due to coronavirus disease 2019: Secondary | ICD-10-CM | POA: Diagnosis not present

## 2021-02-13 DIAGNOSIS — Z9981 Dependence on supplemental oxygen: Secondary | ICD-10-CM | POA: Diagnosis not present

## 2021-02-14 LAB — COLOGUARD: COLOGUARD: NEGATIVE

## 2021-02-14 LAB — EXTERNAL GENERIC LAB PROCEDURE: COLOGUARD: NEGATIVE

## 2021-02-19 ENCOUNTER — Telehealth: Payer: Self-pay

## 2021-02-19 DIAGNOSIS — Z9981 Dependence on supplemental oxygen: Secondary | ICD-10-CM | POA: Diagnosis not present

## 2021-02-19 DIAGNOSIS — J9601 Acute respiratory failure with hypoxia: Secondary | ICD-10-CM | POA: Diagnosis not present

## 2021-02-19 DIAGNOSIS — J1282 Pneumonia due to coronavirus disease 2019: Secondary | ICD-10-CM | POA: Diagnosis not present

## 2021-02-19 DIAGNOSIS — R000 Tachycardia, unspecified: Secondary | ICD-10-CM | POA: Diagnosis not present

## 2021-02-19 DIAGNOSIS — E785 Hyperlipidemia, unspecified: Secondary | ICD-10-CM | POA: Diagnosis not present

## 2021-02-19 DIAGNOSIS — Z7952 Long term (current) use of systemic steroids: Secondary | ICD-10-CM | POA: Diagnosis not present

## 2021-02-19 DIAGNOSIS — D696 Thrombocytopenia, unspecified: Secondary | ICD-10-CM | POA: Diagnosis not present

## 2021-02-19 DIAGNOSIS — E871 Hypo-osmolality and hyponatremia: Secondary | ICD-10-CM | POA: Diagnosis not present

## 2021-02-19 DIAGNOSIS — U071 COVID-19: Secondary | ICD-10-CM | POA: Diagnosis not present

## 2021-02-19 NOTE — Telephone Encounter (Signed)
Brandi [name] from [facility] called with concerns. She state the patient anxiety is worsening since her recent loss of her husband to COVID. She is also concern because her SOB has increased. She state the increase in SOB could be related to her anxiety. The patient is currently using her albuterol inhaler more frequent and oxygen.   I scheduled the patient an appt for tomorrow at 10:40am.

## 2021-02-20 ENCOUNTER — Ambulatory Visit (INDEPENDENT_AMBULATORY_CARE_PROVIDER_SITE_OTHER): Payer: Medicare HMO | Admitting: Family Medicine

## 2021-02-20 ENCOUNTER — Encounter: Payer: Self-pay | Admitting: Family Medicine

## 2021-02-20 ENCOUNTER — Other Ambulatory Visit: Payer: Self-pay

## 2021-02-20 VITALS — HR 99 | Ht 64.0 in | Wt 118.6 lb

## 2021-02-20 DIAGNOSIS — J432 Centrilobular emphysema: Secondary | ICD-10-CM | POA: Diagnosis not present

## 2021-02-20 DIAGNOSIS — F4323 Adjustment disorder with mixed anxiety and depressed mood: Secondary | ICD-10-CM | POA: Diagnosis not present

## 2021-02-20 MED ORDER — BUSPIRONE HCL 5 MG PO TABS: 5.0000 mg | ORAL_TABLET | Freq: Two times a day (BID) | ORAL | 1 refills | Status: DC | PRN

## 2021-02-20 MED ORDER — BREZTRI AEROSPHERE 160-9-4.8 MCG/ACT IN AERO: 2.0000 | INHALATION_SPRAY | Freq: Two times a day (BID) | RESPIRATORY_TRACT | 0 refills | Status: DC

## 2021-02-20 NOTE — Patient Instructions (Addendum)
Thank you for coming to the office today.  Start Breztri 2 puffs twice a day every day for 1 week, call us if it is helpful and we can order it to your pharmacy.  Use Albuterol rescue inhaler less often.  Try Buspar anxiety med take 5mg  as needed up to twice a day.  Recommend COVID vaccine when ready.  Please schedule a Follow-up Appointment to: Return if symptoms worsen or fail to improve, for keep apt as scheduled.  If you have any other questions or concerns, please feel free to call the office or send a message through MyChart. You may also schedule an earlier appointment if necessary.  Additionally, you may be receiving a survey about your experience at our office within a few days to 1 week by e-mail or mail. We value your feedback.  , DO [facility], [facility]
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
## 2021-02-21 DIAGNOSIS — R000 Tachycardia, unspecified: Secondary | ICD-10-CM | POA: Diagnosis not present

## 2021-02-21 DIAGNOSIS — E785 Hyperlipidemia, unspecified: Secondary | ICD-10-CM | POA: Diagnosis not present

## 2021-02-21 DIAGNOSIS — Z7952 Long term (current) use of systemic steroids: Secondary | ICD-10-CM | POA: Diagnosis not present

## 2021-02-21 DIAGNOSIS — U071 COVID-19: Secondary | ICD-10-CM | POA: Diagnosis not present

## 2021-02-21 DIAGNOSIS — D696 Thrombocytopenia, unspecified: Secondary | ICD-10-CM | POA: Diagnosis not present

## 2021-02-21 DIAGNOSIS — Z9981 Dependence on supplemental oxygen: Secondary | ICD-10-CM | POA: Diagnosis not present

## 2021-02-21 DIAGNOSIS — J9601 Acute respiratory failure with hypoxia: Secondary | ICD-10-CM | POA: Diagnosis not present

## 2021-02-21 DIAGNOSIS — E871 Hypo-osmolality and hyponatremia: Secondary | ICD-10-CM | POA: Diagnosis not present

## 2021-02-21 DIAGNOSIS — J1282 Pneumonia due to coronavirus disease 2019: Secondary | ICD-10-CM | POA: Diagnosis not present

## 2021-02-26 DIAGNOSIS — E785 Hyperlipidemia, unspecified: Secondary | ICD-10-CM | POA: Diagnosis not present

## 2021-02-26 DIAGNOSIS — D696 Thrombocytopenia, unspecified: Secondary | ICD-10-CM | POA: Diagnosis not present

## 2021-02-26 DIAGNOSIS — J9601 Acute respiratory failure with hypoxia: Secondary | ICD-10-CM | POA: Diagnosis not present

## 2021-02-26 DIAGNOSIS — R000 Tachycardia, unspecified: Secondary | ICD-10-CM | POA: Diagnosis not present

## 2021-02-26 DIAGNOSIS — Z9981 Dependence on supplemental oxygen: Secondary | ICD-10-CM | POA: Diagnosis not present

## 2021-02-26 DIAGNOSIS — J1282 Pneumonia due to coronavirus disease 2019: Secondary | ICD-10-CM | POA: Diagnosis not present

## 2021-02-26 DIAGNOSIS — J432 Centrilobular emphysema: Secondary | ICD-10-CM | POA: Diagnosis not present

## 2021-02-26 DIAGNOSIS — U071 COVID-19: Secondary | ICD-10-CM | POA: Diagnosis not present

## 2021-02-26 DIAGNOSIS — E871 Hypo-osmolality and hyponatremia: Secondary | ICD-10-CM | POA: Diagnosis not present

## 2021-03-05 ENCOUNTER — Other Ambulatory Visit: Payer: Self-pay

## 2021-03-05 DIAGNOSIS — J9601 Acute respiratory failure with hypoxia: Secondary | ICD-10-CM | POA: Diagnosis not present

## 2021-03-05 DIAGNOSIS — J1282 Pneumonia due to coronavirus disease 2019: Secondary | ICD-10-CM | POA: Diagnosis not present

## 2021-03-05 DIAGNOSIS — K219 Gastro-esophageal reflux disease without esophagitis: Secondary | ICD-10-CM

## 2021-03-05 DIAGNOSIS — D696 Thrombocytopenia, unspecified: Secondary | ICD-10-CM | POA: Diagnosis not present

## 2021-03-05 DIAGNOSIS — R000 Tachycardia, unspecified: Secondary | ICD-10-CM | POA: Diagnosis not present

## 2021-03-05 DIAGNOSIS — E785 Hyperlipidemia, unspecified: Secondary | ICD-10-CM | POA: Diagnosis not present

## 2021-03-05 DIAGNOSIS — U071 COVID-19: Secondary | ICD-10-CM | POA: Diagnosis not present

## 2021-03-05 DIAGNOSIS — E871 Hypo-osmolality and hyponatremia: Secondary | ICD-10-CM | POA: Diagnosis not present

## 2021-03-05 DIAGNOSIS — Z9981 Dependence on supplemental oxygen: Secondary | ICD-10-CM | POA: Diagnosis not present

## 2021-03-05 DIAGNOSIS — J432 Centrilobular emphysema: Secondary | ICD-10-CM | POA: Diagnosis not present

## 2021-03-05 MED ORDER — FAMOTIDINE 20 MG PO TABS: 20.0000 mg | ORAL_TABLET | Freq: Every day | ORAL | 0 refills | Status: DC

## 2021-03-07 ENCOUNTER — Telehealth: Payer: Self-pay | Admitting: Licensed Clinical Social Worker

## 2021-03-07 NOTE — Telephone Encounter (Signed)
I called the patient and got her scheduled for lung screening on 04/09/2021 at 11am.

## 2021-03-11 ENCOUNTER — Telehealth: Payer: Self-pay | Admitting: Cardiovascular Disease

## 2021-03-11 DIAGNOSIS — Z9981 Dependence on supplemental oxygen: Secondary | ICD-10-CM | POA: Diagnosis not present

## 2021-03-11 DIAGNOSIS — U071 COVID-19: Secondary | ICD-10-CM | POA: Diagnosis not present

## 2021-03-11 DIAGNOSIS — D696 Thrombocytopenia, unspecified: Secondary | ICD-10-CM | POA: Diagnosis not present

## 2021-03-11 DIAGNOSIS — J1282 Pneumonia due to coronavirus disease 2019: Secondary | ICD-10-CM | POA: Diagnosis not present

## 2021-03-11 DIAGNOSIS — E871 Hypo-osmolality and hyponatremia: Secondary | ICD-10-CM | POA: Diagnosis not present

## 2021-03-11 DIAGNOSIS — E785 Hyperlipidemia, unspecified: Secondary | ICD-10-CM | POA: Diagnosis not present

## 2021-03-11 DIAGNOSIS — R000 Tachycardia, unspecified: Secondary | ICD-10-CM | POA: Diagnosis not present

## 2021-03-11 DIAGNOSIS — J9601 Acute respiratory failure with hypoxia: Secondary | ICD-10-CM | POA: Diagnosis not present

## 2021-03-11 DIAGNOSIS — J432 Centrilobular emphysema: Secondary | ICD-10-CM | POA: Diagnosis not present

## 2021-03-11 NOTE — Telephone Encounter (Signed)
Was able to return call to [facility] Nurse Felicia,needed  clarification on Zetia and Crestor..the patient has only been taking the Crestor lately per nurse, pt with some confusion as to what medication to take, advised on past telephone encounter with Ardine Eng,   Lynden Ang did ask about her ezetimibe and rosuvastatin. She thought the ezetimibe was to take the place of the rosuvastatin. Advised patient she is to take both ezetimibe 10 mg daily and rosuvastatin 20 mg daily. She verbalized understanding and is aware I have sent refill for rosuvastatin to her pharmacy. "  Sunny Schlein, RN will update pt regarding both Crestor and Zetia, pt has an appt with Dr. Mariah Milling on 5/10, advised may want to draw lipids then for updated labs. Did note lipids in Feb 2022. LDL elevated. Felicia grateful for the timely response, nothing further at this time.

## 2021-03-11 NOTE — Telephone Encounter (Signed)
[facility] Home Nurse calling to discuss possible drug interactions with patients chlolestorol medications, zetia and Crestor

## 2021-03-12 ENCOUNTER — Other Ambulatory Visit: Payer: Self-pay | Admitting: *Deleted

## 2021-03-12 DIAGNOSIS — Z122 Encounter for screening for malignant neoplasm of respiratory organs: Secondary | ICD-10-CM

## 2021-03-12 DIAGNOSIS — Z87891 Personal history of nicotine dependence: Secondary | ICD-10-CM

## 2021-03-12 NOTE — Progress Notes (Signed)
Contacted and scheduled for annual lung screening scan. Patient is a former smoker, quit 2017, 30.75 pack year history.

## 2021-03-19 DIAGNOSIS — U071 COVID-19: Secondary | ICD-10-CM | POA: Diagnosis not present

## 2021-03-19 DIAGNOSIS — E785 Hyperlipidemia, unspecified: Secondary | ICD-10-CM | POA: Diagnosis not present

## 2021-03-19 DIAGNOSIS — R000 Tachycardia, unspecified: Secondary | ICD-10-CM | POA: Diagnosis not present

## 2021-03-19 DIAGNOSIS — J1282 Pneumonia due to coronavirus disease 2019: Secondary | ICD-10-CM | POA: Diagnosis not present

## 2021-03-19 DIAGNOSIS — J432 Centrilobular emphysema: Secondary | ICD-10-CM | POA: Diagnosis not present

## 2021-03-19 DIAGNOSIS — J9601 Acute respiratory failure with hypoxia: Secondary | ICD-10-CM | POA: Diagnosis not present

## 2021-03-19 DIAGNOSIS — E871 Hypo-osmolality and hyponatremia: Secondary | ICD-10-CM | POA: Diagnosis not present

## 2021-03-19 DIAGNOSIS — Z9981 Dependence on supplemental oxygen: Secondary | ICD-10-CM | POA: Diagnosis not present

## 2021-03-19 DIAGNOSIS — D696 Thrombocytopenia, unspecified: Secondary | ICD-10-CM | POA: Diagnosis not present

## 2021-03-23 DIAGNOSIS — J9601 Acute respiratory failure with hypoxia: Secondary | ICD-10-CM | POA: Diagnosis not present

## 2021-03-23 DIAGNOSIS — U071 COVID-19: Secondary | ICD-10-CM | POA: Diagnosis not present

## 2021-03-26 DIAGNOSIS — U071 COVID-19: Secondary | ICD-10-CM | POA: Diagnosis not present

## 2021-03-26 DIAGNOSIS — J432 Centrilobular emphysema: Secondary | ICD-10-CM | POA: Diagnosis not present

## 2021-03-26 DIAGNOSIS — R000 Tachycardia, unspecified: Secondary | ICD-10-CM | POA: Diagnosis not present

## 2021-03-26 DIAGNOSIS — J9601 Acute respiratory failure with hypoxia: Secondary | ICD-10-CM | POA: Diagnosis not present

## 2021-03-26 DIAGNOSIS — Z9981 Dependence on supplemental oxygen: Secondary | ICD-10-CM | POA: Diagnosis not present

## 2021-03-26 DIAGNOSIS — D696 Thrombocytopenia, unspecified: Secondary | ICD-10-CM | POA: Diagnosis not present

## 2021-03-26 DIAGNOSIS — E871 Hypo-osmolality and hyponatremia: Secondary | ICD-10-CM | POA: Diagnosis not present

## 2021-03-26 DIAGNOSIS — E785 Hyperlipidemia, unspecified: Secondary | ICD-10-CM | POA: Diagnosis not present

## 2021-03-26 DIAGNOSIS — J1282 Pneumonia due to coronavirus disease 2019: Secondary | ICD-10-CM | POA: Diagnosis not present

## 2021-03-27 ENCOUNTER — Other Ambulatory Visit: Payer: Self-pay | Admitting: Family Medicine

## 2021-03-27 DIAGNOSIS — J432 Centrilobular emphysema: Secondary | ICD-10-CM

## 2021-03-27 MED ORDER — BREZTRI AEROSPHERE 160-9-4.8 MCG/ACT IN AERO: 2.0000 | INHALATION_SPRAY | Freq: Two times a day (BID) | RESPIRATORY_TRACT | 3 refills | Status: DC

## 2021-03-27 NOTE — Telephone Encounter (Signed)
Medication Refill - Medication: BREZTRI AEROSPHERE 160-9-4.8 MCG/ACT AERO     Preferred Pharmacy (with phone number or street name):  [facility] - [place], [place] - [address] Phone:  [phone]  Fax:  [phone]       Agent: Please be advised that RX refills may take up to 3 business days. We ask that you follow-up with your pharmacy.

## 2021-03-27 NOTE — Telephone Encounter (Signed)
Requested medication (s) are due for refill today: yes  Requested medication (s) are on the active medication list: yes  Last refill: 02/20/21  Future visit scheduled: yes  Notes to clinic:  no assigned protocol    Requested Prescriptions  Pending Prescriptions Disp Refills   BREZTRI AEROSPHERE 160-9-4.8 MCG/ACT AERO 5.9 g 0    Sig: Inhale 2 puffs into the lungs in the morning and at bedtime.      Off-Protocol Failed - 03/27/2021 12:50 PM      Failed - Medication not assigned to a protocol, review manually.      Passed - Valid encounter within last 12 months    Recent Outpatient Visits           1 month ago Centrilobular emphysema (HCC)   [facility] [name], DO   2 months ago Post-COVID-19 syndrome   [facility] [name], DO   2 months ago COVID-19 virus infection   [facility] [place], [name], DO   3 months ago Suspected COVID-19 virus infection   [facility] [place], [name], DO   3 months ago Dark stools   [facility] Althea Charon, [name], DO       Future Appointments             In 1 week Althea Charon, [name], DO [facility]   In 2 weeks Mariah Milling, Tollie Pizza, MD [facility], [facility]   In 1 month Althea Charon, [name], DO [facility], [facility]

## 2021-03-28 ENCOUNTER — Other Ambulatory Visit: Payer: Self-pay

## 2021-03-28 DIAGNOSIS — J432 Centrilobular emphysema: Secondary | ICD-10-CM

## 2021-03-28 MED ORDER — BREZTRI AEROSPHERE 160-9-4.8 MCG/ACT IN AERO: 2.0000 | INHALATION_SPRAY | Freq: Two times a day (BID) | RESPIRATORY_TRACT | 3 refills | Status: DC

## 2021-04-01 ENCOUNTER — Other Ambulatory Visit: Payer: Medicare HMO

## 2021-04-02 DIAGNOSIS — U071 COVID-19: Secondary | ICD-10-CM | POA: Diagnosis not present

## 2021-04-02 DIAGNOSIS — R000 Tachycardia, unspecified: Secondary | ICD-10-CM | POA: Diagnosis not present

## 2021-04-02 DIAGNOSIS — J9601 Acute respiratory failure with hypoxia: Secondary | ICD-10-CM | POA: Diagnosis not present

## 2021-04-02 DIAGNOSIS — J432 Centrilobular emphysema: Secondary | ICD-10-CM | POA: Diagnosis not present

## 2021-04-02 DIAGNOSIS — E871 Hypo-osmolality and hyponatremia: Secondary | ICD-10-CM | POA: Diagnosis not present

## 2021-04-02 DIAGNOSIS — J1282 Pneumonia due to coronavirus disease 2019: Secondary | ICD-10-CM | POA: Diagnosis not present

## 2021-04-02 DIAGNOSIS — Z9981 Dependence on supplemental oxygen: Secondary | ICD-10-CM | POA: Diagnosis not present

## 2021-04-02 DIAGNOSIS — E785 Hyperlipidemia, unspecified: Secondary | ICD-10-CM | POA: Diagnosis not present

## 2021-04-02 DIAGNOSIS — D696 Thrombocytopenia, unspecified: Secondary | ICD-10-CM | POA: Diagnosis not present

## 2021-04-07 DIAGNOSIS — R000 Tachycardia, unspecified: Secondary | ICD-10-CM | POA: Diagnosis not present

## 2021-04-07 DIAGNOSIS — J432 Centrilobular emphysema: Secondary | ICD-10-CM | POA: Diagnosis not present

## 2021-04-07 DIAGNOSIS — D696 Thrombocytopenia, unspecified: Secondary | ICD-10-CM | POA: Diagnosis not present

## 2021-04-07 DIAGNOSIS — E785 Hyperlipidemia, unspecified: Secondary | ICD-10-CM | POA: Diagnosis not present

## 2021-04-07 DIAGNOSIS — U071 COVID-19: Secondary | ICD-10-CM | POA: Diagnosis not present

## 2021-04-07 DIAGNOSIS — E871 Hypo-osmolality and hyponatremia: Secondary | ICD-10-CM | POA: Diagnosis not present

## 2021-04-07 DIAGNOSIS — J9601 Acute respiratory failure with hypoxia: Secondary | ICD-10-CM | POA: Diagnosis not present

## 2021-04-07 DIAGNOSIS — J1282 Pneumonia due to coronavirus disease 2019: Secondary | ICD-10-CM | POA: Diagnosis not present

## 2021-04-07 DIAGNOSIS — Z9981 Dependence on supplemental oxygen: Secondary | ICD-10-CM | POA: Diagnosis not present

## 2021-04-08 ENCOUNTER — Other Ambulatory Visit: Payer: Self-pay

## 2021-04-08 ENCOUNTER — Encounter: Payer: Self-pay | Admitting: Family Medicine

## 2021-04-08 ENCOUNTER — Ambulatory Visit (INDEPENDENT_AMBULATORY_CARE_PROVIDER_SITE_OTHER): Payer: Medicare HMO | Admitting: Family Medicine

## 2021-04-08 VITALS — BP 118/75 | HR 83 | Ht 64.0 in | Wt 120.2 lb

## 2021-04-08 DIAGNOSIS — Z0000 Encounter for general adult medical examination without abnormal findings: Secondary | ICD-10-CM | POA: Diagnosis not present

## 2021-04-08 DIAGNOSIS — J432 Centrilobular emphysema: Secondary | ICD-10-CM | POA: Diagnosis not present

## 2021-04-08 DIAGNOSIS — E559 Vitamin D deficiency, unspecified: Secondary | ICD-10-CM

## 2021-04-08 DIAGNOSIS — R7303 Prediabetes: Secondary | ICD-10-CM | POA: Insufficient documentation

## 2021-04-08 DIAGNOSIS — R590 Localized enlarged lymph nodes: Secondary | ICD-10-CM | POA: Diagnosis not present

## 2021-04-08 DIAGNOSIS — E782 Mixed hyperlipidemia: Secondary | ICD-10-CM

## 2021-04-08 NOTE — Progress Notes (Signed)
Subjective:    Patient ID: Brandi [name], female    DOB: 1956/10/24, 65 y.o.   MRN: [id number]  Brandi [name] is a 65 y.o. female presenting on 04/08/2021 for Annual Exam   HPI   Here for Annual Physical and Lab Review.  HYPERLIPIDEMIA: - Reports followed by Dr Mariah Milling Last lipid panel 01/2021 LDL at 111, controlled otherwise - Currently taking Rosuvastatin 20mg  daily, Zetia 10mg , tolerating well without side effects or myalgias  Centrilobular Emphysema History of covid She has albuterol PRn and has been on Breztri with improvement, now high cost on breztri 600-800 dollars with insurance coverage. Unsure exact reasons. She has not been able to afford this Has upcoming LDCT scan  PreDiabetes A1c 5.8, in 01/2021 History of mild elevated sugar. Not on medication. No new concerns.  COVID Antibody Last checked 01/2021 308  Additional complaints Tingling on bottom of Left foot.  Left groin knot vs Lymph Node Reports recurrent off and on for past 1 year or more. She has mentioned to GYN in past. Also has a possible lymph node behind L Ear episodic for years Asks about if infection can cause it. She has dental infection.   Health Maintenance:  Upcoming LDCT tomorrow.  Depression screen [facility] 2/9 02/20/2021 04/08/2020  Decreased Interest 1 0  Down, Depressed, Hopeless 1 0  PHQ - 2 Score 2 0  Altered sleeping 1 -  Tired, decreased energy 1 -  Change in appetite 1 -  Feeling bad or failure about yourself  0 -  Trouble concentrating 0 -  Moving slowly or fidgety/restless 0 -  Suicidal thoughts 0 -  PHQ-9 Score 5 -  Difficult doing work/chores Not difficult at all -    Past Medical History:  Diagnosis Date  . Hyperlipidemia    Past Surgical History:  Procedure Laterality Date  . APPENDECTOMY    . CHOLECYSTECTOMY    . SHOULDER ARTHROSCOPY Left 07/02/2015   Procedure: ,left shoulder arthroscopy, decompression and debridement;  Surgeon: 06/08/2020, MD;  Location: [facility] ORS;   Service: Orthopedics;  Laterality: Left;  . SHOULDER CLOSED REDUCTION Left 04/09/2015   Procedure: CLOSED MANIPULATION SHOULDER;  Surgeon: Christena Flake, MD;  Location: [facility] ORS;  Service: Orthopedics;  Laterality: Left;   Social History   Socioeconomic History  . Marital status: Married    Spouse name: Not on file  . Number of children: Not on file  . Years of education: Not on file  . Highest education level: Not on file  Occupational History  . Not on file  Tobacco Use  . Smoking status: Former Smoker    Packs/day: 0.75    Years: 41.00    Pack years: 30.75    Types: E-cigarettes, Cigarettes    Quit date: 12/08/2015    Years since quitting: 5.3  . Smokeless tobacco: Former [employer]  . Vaping Use: Never used  Substance and Sexual Activity  . Alcohol use: No  . Drug use: No  . Sexual activity: Yes    Birth control/protection: Post-menopausal  Other Topics Concern  . Not on file  Social History Narrative  . Not on file   Social Determinants of Health   Financial Resource Strain: Not on file  Food Insecurity: Not on file  Transportation Needs: Not on file  Physical Activity: Not on file  Stress: Not on file  Social Connections: Not on file  Intimate Partner Violence: Not on file   Family History  Problem Relation Age of  Onset  . Breast cancer Mother 4260  . Alzheimer's disease Mother    Current Outpatient Medications on File Prior to Visit  Medication Sig  . albuterol (VENTOLIN HFA) 108 (90 Base) MCG/ACT inhaler Inhale 2 puffs into the lungs every 4 (four) hours as needed for wheezing or shortness of breath.  Marland Kitchen. alendronate (FOSAMAX) 70 MG tablet Take 1 tablet (70 mg total) by mouth once a week. Take with a full glass of water on an empty stomach.  Marland Kitchen. alum & mag hydroxide-simeth (MAALOX/MYLANTA) 200-200-20 MG/5ML suspension Take 30 mLs by mouth every 4 (four) hours as needed for indigestion or heartburn.  Marland Kitchen. aspirin EC 81 MG tablet Take 1 tablet (81 mg total) by  mouth daily.  Marland Kitchen. BREZTRI AEROSPHERE 160-9-4.8 MCG/ACT AERO Inhale 2 puffs into the lungs in the morning and at bedtime.  . busPIRone (BUSPAR) 5 MG tablet Take 1 tablet (5 mg total) by mouth 2 (two) times daily as needed (anxiety).  . Calcium Carbonate-Vit D-Min (CALCIUM 1200) 1200-1000 MG-UNIT CHEW Chew 1,200 mg by mouth daily.  . famotidine (PEPCID) 20 MG tablet Take 1 tablet (20 mg total) by mouth daily.  . feeding supplement (ENSURE ENLIVE / ENSURE PLUS) LIQD Take 237 mLs by mouth 2 (two) times daily between meals.  . Investigational vitamin D 600 UNITS capsule SWOG S0812 Take 1 capsule (600 Units total) by mouth daily. Take with food.  . Multiple Vitamin (MULTIVITAMIN WITH MINERALS) TABS tablet Take 1 tablet by mouth daily.  . OXYGEN Inhale into the lungs at bedtime as needed.  . ezetimibe (ZETIA) 10 MG tablet Take 1 tablet (10 mg total) by mouth daily.  . rosuvastatin (CRESTOR) 20 MG tablet Take 1 tablet (20 mg total) by mouth daily.   No current facility-administered medications on file prior to visit.    Review of Systems  Constitutional: Negative for activity change, appetite change, chills, diaphoresis, fatigue and fever.  HENT: Negative for congestion and hearing loss.   Eyes: Negative for visual disturbance.  Respiratory: Negative for cough, chest tightness, shortness of breath and wheezing.   Cardiovascular: Negative for chest pain, palpitations and leg swelling.  Gastrointestinal: Negative for abdominal pain, constipation, diarrhea, nausea and vomiting.  Endocrine: Negative for cold intolerance.  Genitourinary: Negative for dysuria, frequency and hematuria.  Musculoskeletal: Negative for arthralgias and neck pain.  Skin: Negative for rash.  Allergic/Immunologic: Negative for environmental allergies.  Neurological: Positive for numbness (left lower extremity). Negative for dizziness, weakness, light-headedness and headaches.  Hematological: Positive for adenopathy (left  inguinal and L neck).  Psychiatric/Behavioral: Negative for behavioral problems, dysphoric mood and sleep disturbance.   Per HPI unless specifically indicated above      Objective:    BP 118/75   Pulse 83   Wt 120 lb 3.2 oz (54.5 kg)   SpO2 100%   BMI 20.63 kg/m   Wt Readings from Last 3 Encounters:  04/08/21 120 lb 3.2 oz (54.5 kg)  02/20/21 118 lb 9.6 oz (53.8 kg)  01/22/21 118 lb (53.5 kg)    Physical Exam Vitals and nursing note reviewed.  Constitutional:      General: She is not in acute distress.    Appearance: She is well-developed. She is not diaphoretic.     Comments: Well-appearing, comfortable, cooperative  HENT:     Head: Normocephalic and atraumatic.  Eyes:     General:        Right eye: No discharge.        Left eye: No  discharge.     Conjunctiva/sclera: Conjunctivae normal.     Pupils: Pupils are equal, round, and reactive to light.  Neck:     Thyroid: No thyromegaly.  Cardiovascular:     Rate and Rhythm: Normal rate and regular rhythm.     Heart sounds: Normal heart sounds. No murmur heard.   Pulmonary:     Effort: Pulmonary effort is normal. No respiratory distress.     Breath sounds: Normal breath sounds. No wheezing or rales.  Abdominal:     General: Bowel sounds are normal. There is no distension.     Palpations: Abdomen is soft. There is no mass.     Tenderness: There is no abdominal tenderness.  Musculoskeletal:        General: No tenderness. Normal range of motion.     Cervical back: Normal range of motion and neck supple.     Right lower leg: No edema.     Left lower leg: No edema.     Comments: Upper / Lower Extremities: - Normal muscle tone, strength bilateral upper extremities 5/5, lower extremities 5/5  Lymphadenopathy:     Head:     Left side of head: Posterior auricular adenopathy present.     Cervical: Cervical adenopathy present.     Left cervical: Superficial cervical adenopathy present.     Lower Body: Left inguinal  adenopathy (non tender 1 cm localized LAD) present.  Skin:    General: Skin is warm and dry.     Findings: No erythema or rash.  Neurological:     Mental Status: She is alert and oriented to person, place, and time.     Comments: Distal sensation intact to light touch all extremities  Psychiatric:        Behavior: Behavior normal.     Comments: Well groomed, good eye contact, normal speech and thoughts    Results for orders placed or performed in visit on 01/17/21  SARS-CoV-2 Semi-Quantitative Total Antibody, Spike  Result Value Ref Range   SARS COV2 AB, Total Spike Semi QN 308.0 (H) <0.8 U/mL  VITAMIN D 25 Hydroxy (Vit-D Deficiency, Fractures)  Result Value Ref Range   Vit D, 25-Hydroxy 22 (L) 30 - 100 ng/mL  Hepatitis C antibody  Result Value Ref Range   Hepatitis C Ab NON-REACTIVE NON-REACTI   SIGNAL TO CUT-OFF 0.02 <1.00  Lipid panel  Result Value Ref Range   Cholesterol 188 <200 mg/dL   HDL 61 > OR = 50 mg/dL   Triglycerides 73 <161 mg/dL   LDL Cholesterol (Calc) 111 (H) mg/dL (calc)   Total CHOL/HDL Ratio 3.1 <5.0 (calc)   Non-HDL Cholesterol (Calc) 127 <130 mg/dL (calc)  COMPLETE METABOLIC PANEL WITH GFR  Result Value Ref Range   Glucose, Bld 90 65 - 99 mg/dL   BUN 7 7 - 25 mg/dL   Creat 0.96 0.45 - 4.09 mg/dL   GFR, Est Non African American 97 > OR = 60 mL/min/1.35m2   GFR, Est African American 112 > OR = 60 mL/min/1.37m2   BUN/Creatinine Ratio NOT APPLICABLE 6 - 22 (calc)   Sodium 142 135 - 146 mmol/L   Potassium 4.1 3.5 - 5.3 mmol/L   Chloride 106 98 - 110 mmol/L   CO2 30 20 - 32 mmol/L   Calcium 9.1 8.6 - 10.4 mg/dL   Total Protein 6.1 6.1 - 8.1 g/dL   Albumin 3.8 3.6 - 5.1 g/dL   Globulin 2.3 1.9 - 3.7 g/dL (calc)   AG Ratio  1.7 1.0 - 2.5 (calc)   Total Bilirubin 0.4 0.2 - 1.2 mg/dL   Alkaline phosphatase (APISO) 53 37 - 153 U/L   AST 17 10 - 35 U/L   ALT 15 6 - 29 U/L  CBC with Differential/Platelet  Result Value Ref Range   WBC 6.3 3.8 - 10.8  Thousand/uL   RBC 3.85 3.80 - 5.10 Million/uL   Hemoglobin 12.1 11.7 - 15.5 g/dL   HCT 22.2 97.9 - 89.2 %   MCV 94.0 80.0 - 100.0 fL   MCH 31.4 27.0 - 33.0 pg   MCHC 33.4 32.0 - 36.0 g/dL   RDW 11.9 41.7 - 40.8 %   Platelets 475 (H) 140 - 400 Thousand/uL   MPV 9.0 7.5 - 12.5 fL   Neutro Abs 3,043 1,500 - 7,800 cells/uL   Lymphs Abs 2,262 850 - 3,900 cells/uL   Absolute Monocytes 706 200 - 950 cells/uL   Eosinophils Absolute 189 15 - 500 cells/uL   Basophils Absolute 101 0 - 200 cells/uL   Neutrophils Relative % 48.3 %   Total Lymphocyte 35.9 %   Monocytes Relative 11.2 %   Eosinophils Relative 3.0 %   Basophils Relative 1.6 %  Hemoglobin A1c  Result Value Ref Range   Hgb A1c MFr Bld 5.8 (H) <5.7 % of total Hgb   Mean Plasma Glucose 120 mg/dL   eAG (mmol/L) 6.6 mmol/L      Assessment & Plan:   Problem List Items Addressed This Visit    Vitamin D deficiency    Low Vitamin D  Start OTC Vitamin D3 5,000 iu daily for 12 weeks then reduce to OTC Vitamin D3 2,000 iu daily for maintenance       Pre-diabetes    Elevated A1c Counseling on lifestyle diet exercise No medication at this time       Hyperlipidemia    Followed by Cardiology On Zetia, Statin therapy, continue current regimen      Centrilobular emphysema (HCC)    Chronic problem, without flare up Upcoming LDCT scan tomorrow On Breztri - now unable to get due to cost On Albuterol PRN  Will refer to [facility] for med assistance for [employer] or other maintenance therapy      Relevant Orders   AMB Referral to [facility] Coordinaton    Other Visit Diagnoses    Annual physical exam    -  Primary   Inguinal lymphadenopathy       Relevant Orders   [place] LT LOWER EXTREM LTD SOFT TISSUE NON VASCULAR       Updated Health Maintenance information LDCT scheduled for tomorrow 04/09/21 Reviewed recent lab results with patient from 01/2021 Encouraged improvement to lifestyle with diet and exercise Goal of weight  loss  #Additional complaints - LAD L inguinal also some L occipital/neck cervical issue - chronic recurrent over prior years, L inguinal now for past 1 year - Will order ultrasound imaging of LAD in this region, unable to identify exact order in Epic will contact Ultrasound dept to get info on correct test to order.  Orders Placed This Encounter  Procedures  . [place] LT LOWER EXTREM LTD SOFT TISSUE NON VASCULAR    Standing Status:   Future    Standing Expiration Date:   04/08/2022    Order Specific Question:   Reason for Exam (SYMPTOM  OR DIAGNOSIS REQUIRED)    Answer:   Left Inguinal lymphadenopathy    Order Specific Question:   Preferred imaging location?  Answer:   [facility]  . AMB Referral to [facility] Coordinaton    Referral Priority:   Routine    Referral Type:   Consultation    Referral Reason:   Care Coordination    Number of Visits Requested:   1      No orders of the defined types were placed in this encounter.     Follow up plan: Return in about 4 months (around 08/09/2021) for 4 month follow-up COPD, updates.  [name], DO [facility] 04/08/2021, 11:14 AM

## 2021-04-08 NOTE — Assessment & Plan Note (Signed)
Chronic problem, without flare up Upcoming LDCT scan tomorrow On Breztri - now unable to get due to cost On Albuterol PRN  Will refer to [facility] for med assistance for [place] or other maintenance therapy

## 2021-04-08 NOTE — Assessment & Plan Note (Signed)
Elevated A1c Counseling on lifestyle diet exercise No medication at this time

## 2021-04-08 NOTE — Assessment & Plan Note (Signed)
Followed by Cardiology On Zetia, Statin therapy, continue current regimen

## 2021-04-08 NOTE — Assessment & Plan Note (Signed)
Low Vitamin D Start OTC Vitamin D3 5,000 iu daily for 12 weeks then reduce to OTC Vitamin D3 2,000 iu daily for maintenance  

## 2021-04-08 NOTE — Patient Instructions (Addendum)
Thank you for coming to the office today.  Low Vitamin D Start OTC Vitamin D3 5,000 iu daily for 12 weeks then reduce to OTC Vitamin D3 2,000 iu daily for maintenance  We will contact our Clinical PHarmacist and she can call you with more info on the medication / Breztri for the breathing, may need to update this or change to get the cost down or can get from company directly  We will work on ordering an image of the Left Groin stay tuned   Please schedule a Follow-up Appointment to: Return in about 4 months (around 08/09/2021) for 4 month follow-up COPD, updates.  If you have any other questions or concerns, please feel free to call the office or send a message through MyChart. You may also schedule an earlier appointment if necessary.  Additionally, you may be receiving a survey about your experience at our office within a few days to 1 week by e-mail or mail. We value your feedback.  [name], DO [facility], [place]

## 2021-04-09 ENCOUNTER — Ambulatory Visit
Admission: RE | Admit: 2021-04-09 | Discharge: 2021-04-09 | Disposition: A | Discharge status: Home / Self Care | Payer: Medicare HMO | Source: Ambulatory Visit | Attending: Oncology | Admitting: Oncology

## 2021-04-09 DIAGNOSIS — Z122 Encounter for screening for malignant neoplasm of respiratory organs: Secondary | ICD-10-CM | POA: Insufficient documentation

## 2021-04-09 DIAGNOSIS — Z87891 Personal history of nicotine dependence: Secondary | ICD-10-CM

## 2021-04-10 ENCOUNTER — Telehealth: Payer: Self-pay

## 2021-04-10 NOTE — Chronic Care Management (AMB) (Signed)
  Chronic Care Management   Note  04/10/2021 Name: Brandi [name] MRN: [id number] DOB: [birth year]  Brandi [name] is a 65 y.o. year old female who is a primary care patient of Olin Hauser, DO. I reached out to [employer] by phone today in response to a referral sent by Brandi [name]'s PCP, [place] Devonne Doughty, DO     Brandi [name] was given information about Chronic Care Management services today including:  1. CCM service includes personalized support from designated clinical staff supervised by her physician, including individualized plan of care and coordination with other care providers 2. 24/7 contact phone numbers for assistance for urgent and routine care needs. 3. Service will only be billed when office clinical staff spend 20 minutes or more in a month to coordinate care. 4. Only one practitioner may furnish and bill the service in a calendar month. 5. The patient may stop CCM services at any time (effective at the end of the month) by phone call to the office staff. 6. The patient will be responsible for cost sharing (co-pay) of up to 20% of the service fee (after annual deductible is met).  Patient agreed to services and verbal consent obtained.   Follow up plan: Telephone appointment with care management team member scheduled for:04/30/2021  Noreene Larsson, [place], [place], [place] [address] Direct Dial: [phone] [name] Website: [email or web address]

## 2021-04-15 ENCOUNTER — Encounter: Payer: Self-pay | Admitting: Cardiovascular Disease

## 2021-04-15 ENCOUNTER — Other Ambulatory Visit: Payer: Self-pay

## 2021-04-15 ENCOUNTER — Ambulatory Visit: Payer: Medicare HMO | Admitting: Cardiovascular Disease

## 2021-04-15 ENCOUNTER — Encounter: Payer: Self-pay | Admitting: *Deleted

## 2021-04-15 VITALS — BP 106/70 | HR 93 | Ht 64.0 in | Wt 121.2 lb

## 2021-04-15 DIAGNOSIS — Z2821 Immunization not carried out because of patient refusal: Secondary | ICD-10-CM

## 2021-04-15 DIAGNOSIS — E782 Mixed hyperlipidemia: Secondary | ICD-10-CM | POA: Diagnosis not present

## 2021-04-15 DIAGNOSIS — I700 Atherosclerosis of aorta: Secondary | ICD-10-CM | POA: Diagnosis not present

## 2021-04-15 DIAGNOSIS — Z87891 Personal history of nicotine dependence: Secondary | ICD-10-CM | POA: Diagnosis not present

## 2021-04-15 DIAGNOSIS — J432 Centrilobular emphysema: Secondary | ICD-10-CM | POA: Diagnosis not present

## 2021-04-15 DIAGNOSIS — I25118 Atherosclerotic heart disease of native coronary artery with other forms of angina pectoris: Secondary | ICD-10-CM

## 2021-04-15 MED ORDER — EZETIMIBE 10 MG PO TABS: 10.0000 mg | ORAL_TABLET | Freq: Every day | ORAL | 3 refills | Status: DC

## 2021-04-15 MED ORDER — ROSUVASTATIN CALCIUM 20 MG PO TABS: 20.0000 mg | ORAL_TABLET | Freq: Every day | ORAL | 3 refills | Status: DC

## 2021-04-15 NOTE — Progress Notes (Signed)
Cardiology Office Note  Date:  04/15/2021   ID:  Brandi [name], DOB 21-Dec-1955, MRN [id number]  PCP:  [name], DO   Chief Complaint  Patient presents with  . 12 month follow up     Patient c/o chest indigestion and has shortness of breath most days since she had Covid in Jan. 2022, is on 3 Liters of oxygen at home as needed. Medications reviewed by the patient verbally.      HPI:  Brandi [name] is a 65 year old woman with past medical history of Former smoker, 31 years, quit  Aortic atherosclerosis, moderate multivessel coronary calcification on CT Who presents for follow-up of her atherosclerosis, CAD  LOV 03/2020  Lost husband from covid 12/2020 She is having trouble adjusting She had covid in jan 2022  Stress,  Quit smoking  [place] farm, [place], [place]  EKG personally reviewed by myself on todays visit NSR rate 93 bpm, no ST or T changes  CT scan January 2020 Multivessel coronary artery atherosclerosis noted Aortic atherosclerosis CT scan documenting moderate emphysema, bullae Atherosclerosis is mild in the arch Coronary artery calcification appears moderate  Total cholesterol 190 LDL 118 New labs ordered    PMH:    Aortic atherosclerosis Coronary calcification Smoker   PSH:    Past Surgical History:  Procedure Laterality Date  . APPENDECTOMY    . CHOLECYSTECTOMY    . SHOULDER ARTHROSCOPY Left 07/02/2015   Procedure: ,left shoulder arthroscopy, decompression and debridement;  Surgeon: Christena Flake, MD;  Location: [facility] ORS;  Service: Orthopedics;  Laterality: Left;  . SHOULDER CLOSED REDUCTION Left 04/09/2015   Procedure: CLOSED MANIPULATION SHOULDER;  Surgeon: Kennedy Bucker, MD;  Location: [facility] ORS;  Service: Orthopedics;  Laterality: Left;    Current Outpatient Medications  Medication Sig Dispense Refill  . albuterol (VENTOLIN HFA) 108 (90 Base) MCG/ACT inhaler Inhale 2 puffs into the lungs every 4 (four) hours as needed for wheezing  or shortness of breath. 8 g 1  . alum & mag hydroxide-simeth (MAALOX/MYLANTA) 200-200-20 MG/5ML suspension Take 30 mLs by mouth every 4 (four) hours as needed for indigestion or heartburn. 355 mL 0  . aspirin EC 81 MG tablet Take 1 tablet (81 mg total) by mouth daily. 90 tablet 3  . BREZTRI AEROSPHERE 160-9-4.8 MCG/ACT AERO Inhale 2 puffs into the lungs in the morning and at bedtime. 10.7 g 3  . busPIRone (BUSPAR) 5 MG tablet Take 1 tablet (5 mg total) by mouth 2 (two) times daily as needed (anxiety). 60 tablet 1  . Calcium Carbonate-Vit D-Min (CALCIUM 1200) 1200-1000 MG-UNIT CHEW Chew 1,200 mg by mouth daily. 30 each 11  . ezetimibe (ZETIA) 10 MG tablet Take 1 tablet (10 mg total) by mouth daily. 90 tablet 3  . famotidine (PEPCID) 20 MG tablet Take 1 tablet (20 mg total) by mouth daily. 30 tablet 0  . feeding supplement (ENSURE ENLIVE / ENSURE PLUS) LIQD Take 237 mLs by mouth 2 (two) times daily between meals. 237 mL 12  . Investigational vitamin D 600 UNITS capsule SWOG S0812 Take 1 capsule (600 Units total) by mouth daily. Take with food. 30 capsule 11  . Multiple Vitamin (MULTIVITAMIN WITH MINERALS) TABS tablet Take 1 tablet by mouth daily.    . OXYGEN Inhale into the lungs at bedtime as needed.    . OXYGEN Inhale 3 L into the lungs as needed.    . rosuvastatin (CRESTOR) 20 MG tablet Take 1 tablet (20 mg total) by mouth daily.  90 tablet 3  . alendronate (FOSAMAX) 70 MG tablet Take 1 tablet (70 mg total) by mouth once a week. Take with a full glass of water on an empty stomach. (Patient not taking: Reported on 04/15/2021) 4 tablet 11   No current facility-administered medications for this visit.    Allergies:   Patient has no known allergies.   Social History:  The patient  reports that she quit smoking about 5 years ago. Her smoking use included e-cigarettes and cigarettes. She has a 30.75 pack-year smoking history. She has quit using smokeless tobacco. She reports that she does not drink  alcohol and does not use drugs.   Family History:   family history includes Alzheimer's disease in her mother; Breast cancer (age of onset: 4) in her mother.    Review of Systems: Review of Systems  Constitutional: Negative.   HENT: Negative.   Respiratory: Negative.   Cardiovascular: Negative.   Gastrointestinal: Negative.   Musculoskeletal: Negative.   Neurological: Negative.   Psychiatric/Behavioral: The patient is nervous/anxious.   All other systems reviewed and are negative.   PHYSICAL EXAM: VS:  BP 106/70 (BP Location: Left Arm, Patient Position: Sitting, Cuff Size: Normal)   Pulse 93   Ht 5\' 4"  (1.626 m)   Wt 121 lb 4 oz (55 kg)   SpO2 98%   BMI 20.81 kg/m  , BMI Body mass index is 20.81 kg/m. Constitutional:  oriented to person, place, and time. No distress.  HENT:  Head: Grossly normal Eyes:  no discharge. No scleral icterus.  Neck: No JVD, no carotid bruits  Cardiovascular: Regular rate and rhythm, no murmurs appreciated Pulmonary/Chest: Clear to auscultation bilaterally, no wheezes or rails Abdominal: Soft.  no distension.  no tenderness.  Musculoskeletal: Normal range of motion Neurological:  normal muscle tone. Coordination normal. No atrophy Skin: Skin warm and dry Psychiatric: normal affect, pleasant  Recent Labs: 12/20/2020: Magnesium 2.4 01/20/2021: ALT 15; BUN 7; Creat 0.58; Hemoglobin 12.1; Platelets 475; Potassium 4.1; Sodium 142    Lipid Panel Lab Results  Component Value Date   CHOL 188 01/20/2021   HDL 61 01/20/2021   LDLCALC 111 (H) 01/20/2021   TRIG 73 01/20/2021      Wt Readings from Last 3 Encounters:  04/15/21 121 lb 4 oz (55 kg)  04/09/21 120 lb (54.4 kg)  04/08/21 120 lb 3.2 oz (54.5 kg)      ASSESSMENT AND PLAN:  Aortic atherosclerosis (HCC) -  Quit smoking, on crestor Recommended cholesterol medication, goal LDL less than 70  Coronary artery calcification seen on CT scan -  81 mg aspirin daily Continue  crestor  Former smoker -  Smoked for 31 years, stopped  3 years ago Likely a major factor in her coronary calcification and aortic atherosclerosis  Mixed hyperlipidemia - Continue Crestor daily  SOB/Emphysema Moderate on CT scan, Diffuse bronchial wall thickening with emphysema,  Albuterol as needed    Total encounter time more than 25 minutes  Greater than 50% was spent in counseling and coordination of care with the patient   No orders of the defined types were placed in this encounter.    Signed, 06/08/21, M.D., Ph.D. 04/15/2021  [facility] [place], [place]

## 2021-04-15 NOTE — Patient Instructions (Signed)
Medication Instructions:  No changes  If you need a refill on your cardiac medications before your next appointment, please call your pharmacy.    Lab work: No new labs needed   If you have labs (blood work) drawn today and your tests are completely normal, you will receive your results only by: . MyChart Message (if you have MyChart) OR . A paper copy in the mail If you have any lab test that is abnormal or we need to change your treatment, we will call you to review the results.   Testing/Procedures: No new testing needed   Follow-Up: At [facility], you and your health needs are our priority.  As part of our continuing mission to provide you with exceptional heart care, we have created designated Provider Care Teams.  These Care Teams include your primary Cardiologist (physician) and Advanced Practice Providers (APPs -  Physician Assistants and Nurse Practitioners) who all work together to provide you with the care you need, when you need it.  . You will need a follow up appointment in 12 months  . Providers on your designated Care Team:   . Christopher Berge, NP . Ryan Dunn, PA-C . Jacquelyn Visser, PA-C  Any Other Special Instructions Will Be Listed Below (If Applicable).  COVID-19 Vaccine Information can be found at: [email or web address] For questions related to vaccine distribution or appointments, please email [email or web address] or call [phone].     

## 2021-04-17 DIAGNOSIS — J9601 Acute respiratory failure with hypoxia: Secondary | ICD-10-CM | POA: Diagnosis not present

## 2021-04-17 DIAGNOSIS — R000 Tachycardia, unspecified: Secondary | ICD-10-CM | POA: Diagnosis not present

## 2021-04-17 DIAGNOSIS — D696 Thrombocytopenia, unspecified: Secondary | ICD-10-CM | POA: Diagnosis not present

## 2021-04-17 DIAGNOSIS — E871 Hypo-osmolality and hyponatremia: Secondary | ICD-10-CM | POA: Diagnosis not present

## 2021-04-17 DIAGNOSIS — Z9981 Dependence on supplemental oxygen: Secondary | ICD-10-CM | POA: Diagnosis not present

## 2021-04-17 DIAGNOSIS — J1282 Pneumonia due to coronavirus disease 2019: Secondary | ICD-10-CM | POA: Diagnosis not present

## 2021-04-17 DIAGNOSIS — U071 COVID-19: Secondary | ICD-10-CM | POA: Diagnosis not present

## 2021-04-17 DIAGNOSIS — J432 Centrilobular emphysema: Secondary | ICD-10-CM | POA: Diagnosis not present

## 2021-04-17 DIAGNOSIS — E785 Hyperlipidemia, unspecified: Secondary | ICD-10-CM | POA: Diagnosis not present

## 2021-04-22 DIAGNOSIS — J9601 Acute respiratory failure with hypoxia: Secondary | ICD-10-CM | POA: Diagnosis not present

## 2021-04-22 DIAGNOSIS — U071 COVID-19: Secondary | ICD-10-CM | POA: Diagnosis not present

## 2021-04-23 ENCOUNTER — Other Ambulatory Visit: Payer: Self-pay | Admitting: Family Medicine

## 2021-04-23 DIAGNOSIS — J9601 Acute respiratory failure with hypoxia: Secondary | ICD-10-CM | POA: Diagnosis not present

## 2021-04-23 DIAGNOSIS — K219 Gastro-esophageal reflux disease without esophagitis: Secondary | ICD-10-CM

## 2021-04-23 DIAGNOSIS — E871 Hypo-osmolality and hyponatremia: Secondary | ICD-10-CM | POA: Diagnosis not present

## 2021-04-23 DIAGNOSIS — R000 Tachycardia, unspecified: Secondary | ICD-10-CM | POA: Diagnosis not present

## 2021-04-23 DIAGNOSIS — J1282 Pneumonia due to coronavirus disease 2019: Secondary | ICD-10-CM | POA: Diagnosis not present

## 2021-04-23 DIAGNOSIS — J432 Centrilobular emphysema: Secondary | ICD-10-CM | POA: Diagnosis not present

## 2021-04-23 DIAGNOSIS — U071 COVID-19: Secondary | ICD-10-CM | POA: Diagnosis not present

## 2021-04-23 DIAGNOSIS — Z9981 Dependence on supplemental oxygen: Secondary | ICD-10-CM | POA: Diagnosis not present

## 2021-04-23 DIAGNOSIS — D696 Thrombocytopenia, unspecified: Secondary | ICD-10-CM | POA: Diagnosis not present

## 2021-04-23 DIAGNOSIS — E785 Hyperlipidemia, unspecified: Secondary | ICD-10-CM | POA: Diagnosis not present

## 2021-04-25 ENCOUNTER — Ambulatory Visit: Payer: Medicare HMO

## 2021-04-30 ENCOUNTER — Ambulatory Visit (INDEPENDENT_AMBULATORY_CARE_PROVIDER_SITE_OTHER): Payer: Medicare HMO | Admitting: Pharmacist

## 2021-04-30 DIAGNOSIS — F4323 Adjustment disorder with mixed anxiety and depressed mood: Secondary | ICD-10-CM

## 2021-04-30 DIAGNOSIS — E559 Vitamin D deficiency, unspecified: Secondary | ICD-10-CM

## 2021-04-30 DIAGNOSIS — E782 Mixed hyperlipidemia: Secondary | ICD-10-CM | POA: Diagnosis not present

## 2021-04-30 DIAGNOSIS — J432 Centrilobular emphysema: Secondary | ICD-10-CM | POA: Diagnosis not present

## 2021-04-30 NOTE — Patient Instructions (Signed)
Visit Information   PATIENT GOALS:  Goals Addressed            This Visit's Progress   . Pharmacy Goals       Our goal bad cholesterol, or LDL, is less than 70. This is why it is important to continue taking your rosuvastatin and ezetimibe.  Please start taking OTC Vitamin D3 5,000 IU daily for 12 weeks then reduce to OTC Vitamin D3 2,000 IU daily for maintenance  Feel free to call me with any questions or concerns. I look forward to our next call!  Harlow Asa, PharmD, [place], CPP Clinical Pharmacist [facility] [phone]       Consent to CCM Services: Brandi [name] was given information about Chronic Care Management services today including:  1. CCM service includes personalized support from designated clinical staff supervised by her physician, including individualized plan of care and coordination with other care providers 2. 24/7 contact phone numbers for assistance for urgent and routine care needs. 3. Service will only be billed when office clinical staff spend 20 minutes or more in a month to coordinate care. 4. Only one practitioner may furnish and bill the service in a calendar month. 5. The patient may stop CCM services at any time (effective at the end of the month) by phone call to the office staff. 6. The patient will be responsible for cost sharing (co-pay) of up to 20% of the service fee (after annual deductible is met).  Patient agreed to services and verbal consent obtained.   The patient verbalized understanding of instructions, educational materials, and care plan provided today and declined offer to receive copy of patient instructions, educational materials, and care plan.     CLINICAL CARE PLAN: Patient Care Plan: PharmD - Medication Assistance/Adherence    Problem Identified: Disease Progression     Long-Range Goal: Disease Progression Prevented or Minimized   Start Date: 04/30/2021  Expected End Date: 07/29/2021   This Visit's Progress: On track  Priority: High  Note:   Current Barriers:  . Unable to independently afford treatment regimen o Reports cost of Breztri inhaler unaffordable through [facility] health plan o Patient to enroll in [facility] 20[facility] COPD Inhaler Support Program  Pharmacist Clinical Goal(s):  Marland Kitchen Over the next 90 days, patient will verbalize ability to afford treatment regimen through collaboration with PharmD and provider.   Interventions: . 1:1 collaboration with Olin Hauser, DO regarding development and update of comprehensive plan of care as evidenced by provider attestation and co-signature . Inter-disciplinary care team collaboration (see longitudinal plan of care) . Perform chart review o Office Visit with PCP on 5/3 for Annual Physical - Provider advised patient to start OTC Vitamin D3 5,000 iu daily for 12 weeks then reduce to OTC Vitamin D3 2,000 iu daily for maintenance - Referral to CM Pharmacist for medication assistance for Breztri inhaler o Office Visit with [facility] on 5/10 for follow up . Comprehensive medication review performed; medication list updated in electronic medical record  Medication Assistance: . Reports current copayment for Breztri inhaler is $45/month through [facility] prescription coverage and is unaffordable . Based on patient report, meets income requirement for Extra Help through [employer], but concern that would not qualify based on other resources (money received from spouse's life insurance) . Based on reported income, patient would qualify for patient assistance for Breztri inhaler from [employer], but would require patient to have completed Extra Help application o Today patient states  that she prefers to hold off on application for Extra Help at this time as feels overwhelmed and concerned that could impact her receiving food stamps . Also provide information about [facility] COPD Inhaler Support program  [phone]) for patient. Encourage patient to call to determine eligibility to receive inhaler for no or reduced cost. Note coverage only for deductible and initial coverage stage of pharmacy benefit for calendar year o Receive call back from patient. Reports has enrolled in COPD Inhaler Support Program and [name] will send request to office for Rx and then patient can receive from program for $0 copayment . Patient interested in referral to CCM Social Worker - patient expresses feeling overwhelmed with financial situation, loss of her husband (12/2020) and legal concerns with step-son o Will collaborate with CCM Social Worker  COPD: . Current treatment: o Breztri inhaler - 2 puffs in morning and at bedtime o Albuterol inhaler - 1 puffs every 4 hours as needed o On home oxygen as needed . Reports has been rationing use of Breztri inhaler due to cost . Encourage patient to use Breztri inhaler twice daily (and rinse out mouth after use) and use albuterol inhaler as needed as directed . Have offered options to assist with cost of inhaler (see above) . Reports has supply of oxygen at home, but is interested in having a portable (backpack) oxygen tank o Advise patient to follow up with PCP about interest in portable oxygen tank at upcoming appointment o Will send PCP a message  Vitamin D deficiency: . Patient denies having started Vitamin D treatment as recommended by PCP o Counsel patient to pick up and start OTC Vitamin D3 5,000 iu daily for 12 weeks then reduce to OTC Vitamin D3 2,000 iu daily for maintenance  Osteoporosis: . Last DEXA: 01/04/2019, BMD measured at Femur Total Right is 0.630 g/cm2 with a T-score of -3.0 . From review of chart, note patient advised to start taking alendronate by OB/GYN on 11/04/2020. Reports picked up but never started taking alendronate.  . Today encourage patient to start taking alendronate as directed. Counsel to take first thing in the morning and ?30  minutes before the first food, beverage (except plain water), or other medication of the day and to stay upright (not to lie down) for ?30 minutes and until after first food of the day (to reduce esophageal irritation). o Patient verbalizes understanding . Encourage patient to continue taking calcium supplement as directed  Medication Adherence: . Encourage patient to start using weekly pillbox as adherence aid  Patient Goals/Self-Care Activities . Over the next 90 days, patient will:  - take medications as prescribed collaborate with provider on medication access solutions  Follow Up Plan: Telephone follow up appointment with care management team member scheduled for: 6/22 at 11:15 am      Harlow Asa, PharmD, [place], [facility] [phone]

## 2021-04-30 NOTE — Chronic Care Management (AMB) (Signed)
Chronic Care Management Pharmacy Note  04/30/2021 Name:  Brandi [name] MRN:  [id number] DOB:  [birth year]  Subjective: Brandi [name] is an 65 y.o. year old female who is a primary patient of Olin Hauser, DO.  The CCM team was consulted for assistance with disease management and care coordination needs.    Engaged with patient by telephone for initial visit in response to provider referral for pharmacy case management and/or care coordination services.   Consent to Services:  The patient was given information about Chronic Care Management services, agreed to services, and gave verbal consent prior to initiation of services.  Please see initial visit note for detailed documentation.   Patient Care Team: Olin Hauser, DO as PCP - General (Family Medicine) Makaylen Thieme, Virl Diamond, RPH-CPP (Pharmacist)  Recent office visits: Office Visit with PCP on 5/3 for Annual Physical  Recent consult visits: Office Visit with [facility] on 5/10 for follow up  Hospital visits: Admitted to the hospital on 12/16/2020 due to COVID-19 infection. Discharge date was 12/23/2020. Discharged from [facility].    Objective:  Lab Results  Component Value Date   CREATININE 0.58 01/20/2021   CREATININE 0.51 12/23/2020   CREATININE 0.44 12/22/2020       Component Value Date/Time   CHOL 188 01/20/2021 0801   CHOL 145 04/01/2020 0848   TRIG 73 01/20/2021 0801   HDL 61 01/20/2021 0801   HDL 55 04/01/2020 0848   CHOLHDL 3.1 01/20/2021 0801   LDLCALC 111 (H) 01/20/2021 0801    Hepatic Function Latest Ref Rng & Units 01/20/2021 12/22/2020 12/21/2020  Total Protein 6.1 - 8.1 g/dL 6.1 6.2(L) 5.3(L)  Albumin 3.5 - 5.0 g/dL - 3.4(L) 3.0(L)  AST 10 - 35 U/L _0 ALT 6 - 29 U/L _1 Alk Phosphatase 38 - 126 U/L - 39 36(L)  Total Bilirubin 0.2 - 1.2 mg/dL 0.4 0.7 0.5  Bilirubin, Direct 0.00 - 0.40 mg/dL - - -     Lab Results  Component  Value Date/Time   VD25OH 22 (L) 01/20/2021 08:01 AM   VD25OH 21.4 (L) 01/31/2019 03:13 PM    Social History   Tobacco Use  Smoking Status Former Smoker  . Packs/day: 0.75  . Years: 41.00  . Pack years: 30.75  . Types: E-cigarettes, Cigarettes  . Quit date: 12/08/2015  . Years since quitting: 5.3  Smokeless Tobacco Former User   BP Readings from Last 3 Encounters:  04/15/21 106/70  04/08/21 118/75  01/22/21 101/64   Pulse Readings from Last 3 Encounters:  04/15/21 93  04/08/21 83  02/20/21 99   Wt Readings from Last 3 Encounters:  04/15/21 121 lb 4 oz (55 kg)  04/09/21 120 lb (54.4 kg)  04/08/21 120 lb 3.2 oz (54.5 kg)    Assessment: Review of patient past medical history, allergies, medications, health status, including review of consultants reports, laboratory and other test data, was performed as part of comprehensive evaluation and provision of chronic care management services.   SDOH:  (Social Determinants of Health) assessments and interventions performed: none   CCM Care Plan  No Known Allergies  Medications Reviewed Today    Reviewed by Vella Raring, RPH-CPP (Pharmacist) on 04/30/21 at [place] List Status: <None>  Medication Order Taking? Sig Documenting Provider Last Dose Status Informant  albuterol (VENTOLIN HFA) 108 (90 Base) MCG/ACT inhaler [id number] Yes Inhale 2 puffs into the lungs every 4 (four) hours as  needed for wheezing or shortness of breath. Ezekiel Slocumb, DO Taking Active   alendronate (FOSAMAX) 70 MG tablet [id number] No Take 1 tablet (70 mg total) by mouth once a week. Take with a full glass of water on an empty stomach.  Patient not taking: No sig reported   Homero Fellers, MD Not Taking Active            Med Note Arby Barrette   Tue Dec 17, 2020 10:13 AM) Patient never started medication due to stomach issues  alum & mag hydroxide-simeth (MAALOX/MYLANTA) 200-200-20 MG/5ML suspension [id number] No Take 30 mLs by mouth  every 4 (four) hours as needed for indigestion or heartburn.  Patient not taking: Reported on 04/30/2021   Ezekiel Slocumb, DO Not Taking Active   aspirin EC 81 MG tablet [id number] Yes Take 1 tablet (81 mg total) by mouth daily. Minna Merritts, MD Taking Active Self  BREZTRI AEROSPHERE 160-9-4.8 MCG/ACT Hollie Salk [id number] Yes Inhale 2 puffs into the lungs in the morning and at bedtime. Olin Hauser, DO Taking Active   busPIRone (BUSPAR) 5 MG tablet [id number] Yes Take 1 tablet (5 mg total) by mouth 2 (two) times daily as needed (anxiety). Olin Hauser, DO Taking Active   Calcium Carbonate-Vit D-Min (CALCIUM 1200) 1200-1000 MG-UNIT CHEW [id number] No Chew 1,200 mg by mouth daily.  Patient not taking: Reported on 04/30/2021   Homero Fellers, MD Not Taking Active Self  Cholecalciferol (VITAMIN D3) 125 MCG (5000 UT) TABS [id number] Yes Take by mouth. Vitamin D3 5,000 IU daily for 12 weeks then reduce to OTC Vitamin D3 2,000 IU daily for maintenance [provider]  Active   ezetimibe (ZETIA) 10 MG tablet [id number] Yes Take 1 tablet (10 mg total) by mouth daily. Minna Merritts, MD Taking Active   famotidine (PEPCID) 20 MG tablet [id number] Yes TAKE 1 TABLET BY MOUTH TWICE DAILY [name], Devonne Doughty, DO Taking Active   feeding supplement (ENSURE ENLIVE / ENSURE PLUS) LIQD [id number] No Take 237 mLs by mouth 2 (two) times daily between meals.  Patient not taking: Reported on 04/30/2021   Ezekiel Slocumb, DO Not Taking Active   Multiple Vitamin (MULTIVITAMIN WITH MINERALS) TABS tablet [id number] No Take 1 tablet by mouth daily.  Patient not taking: Reported on 04/30/2021   Ezekiel Slocumb, DO Not Taking Active   OXYGEN [id number]  Inhale into the lungs at bedtime as needed. [provider]  Active   OXYGEN [id number]  Inhale 3 L into the lungs as needed. [provider]  Active   rosuvastatin (CRESTOR) 20 MG tablet [id number] Yes Take 1 tablet (20  mg total) by mouth daily. Minna Merritts, MD Taking Active   sodium chloride (OCEAN) 0.65 % SOLN nasal spray [id number] Yes Place 1 spray into both nostrils as needed for congestion. [provider] Taking Active           Patient Active Problem List   Diagnosis Date Noted  . Pre-diabetes 04/08/2021  . Vitamin D deficiency 04/08/2021  . COVID-19 vaccination declined 12/04/2020  . Centrilobular emphysema ([place]) 04/08/2020  . Nodule of upper lobe of left lung 04/08/2020  . Aortic atherosclerosis ([place]) 05/21/2019  . Coronary artery calcification seen on CT scan 05/21/2019  . Former smoker 05/21/2019  . Hyperlipidemia 05/21/2019     There is no immunization history on file for this patient.  Conditions to be addressed/monitored: COPD, Vitamin D deficiency, HLD  Care Plan : PharmD -  Medication Assistance/Adherence  Updates made by Vella Raring, RPH-CPP since 04/30/2021 12:00 AM    Problem: Disease Progression     Long-Range Goal: Disease Progression Prevented or Minimized   Start Date: 04/30/2021  Expected End Date: 07/29/2021  This Visit's Progress: On track  Priority: High  Note:   Current Barriers:  . Unable to independently afford treatment regimen o Reports cost of Breztri inhaler unaffordable through [facility] health plan o Patient to enroll in [facility] 2022 COPD Inhaler Support Program  Pharmacist Clinical Goal(s):  Marland Kitchen Over the next 90 days, patient will verbalize ability to afford treatment regimen through collaboration with PharmD and provider.   Interventions: . 1:1 collaboration with Olin Hauser, DO regarding development and update of comprehensive plan of care as evidenced by provider attestation and co-signature . Inter-disciplinary care team collaboration (see longitudinal plan of care) . Perform chart review o Office Visit with PCP on 5/3 for Annual Physical - Provider advised patient to start OTC Vitamin D3 5,000 iu daily  for 12 weeks then reduce to OTC Vitamin D3 2,000 iu daily for maintenance - Referral to CM Pharmacist for medication assistance for Breztri inhaler o Office Visit with [facility] on 5/10 for follow up . Comprehensive medication review performed; medication list updated in electronic medical record  Medication Assistance: . Reports current copayment for Breztri inhaler is $45/month through [facility] prescription coverage and is unaffordable . Based on patient report, meets income requirement for Extra Help through [employer], but concern that would not qualify based on other resources (money received from spouse's life insurance) . Based on reported income, patient would qualify for patient assistance for Breztri inhaler from [employer], but would require patient to have completed Extra Help application o Today patient states that she prefers to hold off on application for Extra Help at this time as feels overwhelmed and concerned that could impact her receiving food stamps . Also provide information about [facility] COPD Inhaler Support program [phone]) for patient. Encourage patient to call to determine eligibility to receive inhaler for no or reduced cost. Note coverage only for deductible and initial coverage stage of pharmacy benefit for calendar year o Receive call back from patient. Reports has enrolled in COPD Inhaler Support Program today and [name] will send request to office for Rx so patient can receive from program for $0 copayment . Patient interested in referral to CCM Social Worker - patient expresses feeling overwhelmed with financial situation, loss of her husband (12/2020) and legal concerns with step-son o Will collaborate with CCM Social Worker  COPD: . Current treatment: o Breztri inhaler - 2 puffs in morning and at bedtime o Albuterol inhaler - 1 puffs every 4 hours as needed o On home oxygen as needed . Reports has been rationing use of Breztri inhaler due to  cost . Encourage patient to use Breztri inhaler twice daily (and rinse out mouth after use) and use albuterol inhaler as needed as directed . Have offered options to assist with cost of inhaler (see above) . Reports has supply of oxygen at home, but is interested in having a portable (backpack) oxygen tank o Advise patient to follow up with PCP about interest in portable oxygen tank at upcoming appointment o Will send PCP a message  Vitamin D deficiency: . Patient denies having started Vitamin D treatment as recommended by PCP o Counsel patient to pick up and start OTC Vitamin D3 5,000 iu daily for 12 weeks then reduce to OTC Vitamin  D3 2,000 iu daily for maintenance  Osteoporosis: . Last DEXA: 01/04/2019, BMD measured at Femur Total Right is 0.630 g/cm2 with a T-score of -3.0 . From review of chart, note patient advised to start taking alendronate by OB/GYN on 11/04/2020. Reports picked up but never started taking alendronate.  . Today encourage patient to start taking alendronate as directed. Counsel to take first thing in the morning and ?30 minutes before the first food, beverage (except plain water), or other medication of the day and to stay upright (not to lie down) for ?30 minutes and until after first food of the day (to reduce esophageal irritation). o Patient verbalizes understanding . Encourage patient to continue taking calcium supplement as directed  Medication Adherence: . Encourage patient to start using weekly pillbox as adherence aid  Patient Goals/Self-Care Activities . Over the next 90 days, patient will:  - take medications as prescribed collaborate with provider on medication access solutions  Follow Up Plan: Telephone follow up appointment with care management team member scheduled for: 6/22 at 11:15 am      Patient's preferred pharmacy is:  [facility] [address] [place] [address] Phone: [phone] Fax:  [phone]   Follow Up:  Patient agrees to Care Plan and Follow-up.  Harlow Asa, PharmD, Para March, CPP Clinical Pharmacist [facility] [phone]

## 2021-05-02 ENCOUNTER — Other Ambulatory Visit: Payer: Self-pay

## 2021-05-02 DIAGNOSIS — J9601 Acute respiratory failure with hypoxia: Secondary | ICD-10-CM | POA: Diagnosis not present

## 2021-05-02 DIAGNOSIS — J432 Centrilobular emphysema: Secondary | ICD-10-CM

## 2021-05-02 DIAGNOSIS — U071 COVID-19: Secondary | ICD-10-CM | POA: Diagnosis not present

## 2021-05-02 MED ORDER — BREZTRI AEROSPHERE 160-9-4.8 MCG/ACT IN AERO: 2.0000 | INHALATION_SPRAY | Freq: Two times a day (BID) | RESPIRATORY_TRACT | 3 refills | Status: DC

## 2021-05-06 ENCOUNTER — Other Ambulatory Visit: Payer: Self-pay

## 2021-05-06 ENCOUNTER — Encounter: Payer: Self-pay | Admitting: Family Medicine

## 2021-05-06 ENCOUNTER — Ambulatory Visit: Payer: Medicare HMO | Admitting: Licensed Clinical Social Worker

## 2021-05-06 ENCOUNTER — Ambulatory Visit (INDEPENDENT_AMBULATORY_CARE_PROVIDER_SITE_OTHER): Payer: Medicare HMO | Admitting: Family Medicine

## 2021-05-06 VITALS — BP 110/67 | HR 87 | Ht 64.0 in | Wt 122.0 lb

## 2021-05-06 DIAGNOSIS — J432 Centrilobular emphysema: Secondary | ICD-10-CM | POA: Diagnosis not present

## 2021-05-06 DIAGNOSIS — F4323 Adjustment disorder with mixed anxiety and depressed mood: Secondary | ICD-10-CM

## 2021-05-06 DIAGNOSIS — E559 Vitamin D deficiency, unspecified: Secondary | ICD-10-CM

## 2021-05-06 NOTE — Patient Instructions (Addendum)
Thank you for coming to the office today.  Handicap placard today  [facility] [address], [address] [place], [place] [address] Phone: [phone]  They will call you with an apt. Can discuss oxygen if needed and other testing.   Please schedule a Follow-up Appointment to: Return in about 3 months (around 08/06/2021) for 3 month COPD.  If you have any other questions or concerns, please feel free to call the office or send a message through MyChart. You may also schedule an earlier appointment if necessary.  Additionally, you may be receiving a survey about your experience at our office within a few days to 1 week by e-mail or mail. We value your feedback.  [name], DO [facility], [place]

## 2021-05-06 NOTE — Progress Notes (Signed)
Subjective:    Patient ID: Brandi [name], female    DOB: 10-04-1956, 65 y.o.   MRN: [id number]  Brandi [name] is a 65 y.o. female presenting on 05/06/2021 for COPD   HPI   Father in law passed recently. Stressors  Centrilobular Emphysema History of covid She has albuterol PRN and has been on Breztri with improvement, now high cost on breztri 600-800 dollars with insurance coverage. Working with CCM pharmacy now and has been able to get [place] approved through end of year. Has had LDCT CT screening No longer on regular oxygen use, has completed Home Health, they will pick up oxygen, she is concerned about if she needs it PRN in future. Still gradually improving lung function but asks for more help at this time  Health Maintenance: Has not received COVID19 vaccine. She is not sure if she wants to get it.  Depression screen [facility] 2/9 05/06/2021 02/20/2021 04/08/2020  Decreased Interest 0 1 0  Down, Depressed, Hopeless 0 1 0  PHQ - 2 Score 0 2 0  Altered sleeping 0 1 -  Tired, decreased energy 0 1 -  Change in appetite 0 1 -  Feeling bad or failure about yourself  0 0 -  Trouble concentrating 0 0 -  Moving slowly or fidgety/restless 0 0 -  Suicidal thoughts 0 0 -  PHQ-9 Score 0 5 -  Difficult doing work/chores Not difficult at all Not difficult at all -    Social History   Tobacco Use  . Smoking status: Former Smoker    Packs/day: 0.75    Years: 41.00    Pack years: 30.75    Types: E-cigarettes, Cigarettes    Quit date: 12/08/2015    Years since quitting: 5.4  . Smokeless tobacco: Former Clinical biochemist  . Vaping Use: Never used  Substance Use Topics  . Alcohol use: No  . Drug use: No    Review of Systems Per HPI unless specifically indicated above     Objective:    BP 110/67   Pulse 87   Ht 5\' 4"  (1.626 m)   Wt 122 lb (55.3 kg)   SpO2 98%   BMI 20.94 kg/m   Wt Readings from Last 3 Encounters:  05/06/21 122 lb (55.3 kg)  04/15/21 121 lb 4 oz (55 kg)   04/09/21 120 lb (54.4 kg)    Physical Exam Vitals and nursing note reviewed.  Constitutional:      General: She is not in acute distress.    Appearance: She is well-developed. She is not diaphoretic.     Comments: Well-appearing, comfortable, cooperative  HENT:     Head: Normocephalic and atraumatic.  Eyes:     General:        Right eye: No discharge.        Left eye: No discharge.     Conjunctiva/sclera: Conjunctivae normal.  Neck:     Thyroid: No thyromegaly.  Cardiovascular:     Rate and Rhythm: Normal rate and regular rhythm.     Heart sounds: Normal heart sounds. No murmur heard.   Pulmonary:     Effort: Pulmonary effort is normal. No respiratory distress.     Breath sounds: Normal breath sounds. No wheezing or rales.  Musculoskeletal:        General: Normal range of motion.     Cervical back: Normal range of motion and neck supple.  Lymphadenopathy:     Cervical: No cervical adenopathy.  Skin:  General: Skin is warm and dry.     Findings: No erythema or rash.  Neurological:     Mental Status: She is alert and oriented to person, place, and time.  Psychiatric:        Behavior: Behavior normal.     Comments: Well groomed, good eye contact, normal speech and thoughts      I have personally reviewed the radiology report from 04/09/21, LDCT   CT CHEST LUNG CANCER SCREENING LOW DOSE WO CONTRAST [[id number]] Resulted: 04/10/21 1208  Order Status: Completed Updated: 04/10/21 1211  Narrative:   CLINICAL DATA: Lung cancer screening. 30.75 pack-year history.  Former asymptomatic smoker.   EXAM:  CT CHEST WITHOUT CONTRAST LOW-DOSE FOR LUNG CANCER SCREENING   TECHNIQUE:  Multidetector CT imaging of the chest was performed following the  standard protocol without IV contrast.   COMPARISON: 04/01/2020   FINDINGS:  Cardiovascular: The heart size is within normal limits. Aortic  atherosclerosis. Coronary artery calcifications.   Mediastinum/Nodes: No enlarged  mediastinal, hilar, or axillary lymph  nodes. Thyroid gland, trachea, and esophagus demonstrate no  significant findings.   Lungs/Pleura: No pleural effusion. Moderate paraseptal and  centrilobular emphysema with diffuse bronchial wall thickening. No  airspace consolidation, atelectasis, or pneumothorax. Scar like  densities noted within the right lower lobe. Previous left upper  lobe lung nodule measures 2 mm on today's exam. Previously 3.9 mm.  No new lung nodules.   Upper Abdomen: No acute abnormality. Previous cholecystectomy. No  bile duct dilatation identified.   Musculoskeletal: Mild osteopenia. No acute or suspicious osseous  abnormalities. Superior endplate compression deformities identified  at T7 and T9. Unchanged.   IMPRESSION:  1. Lung-RADS 2, benign appearance or behavior. Continue annual  screening with low-dose chest CT without contrast in 12 months.  2. Diffuse bronchial wall thickening with emphysema, as above;  imaging findings suggestive of underlying COPD.  3. Coronary artery calcifications.   Aortic Atherosclerosis (ICD10-I70.0) and Emphysema (ICD10-J43.9).    Electronically Signed  By: Signa Kell M.D.  On: 04/10/2021 12:08      Results for orders placed or performed in visit on 01/17/21  SARS-CoV-2 Semi-Quantitative Total Antibody, Spike  Result Value Ref Range   SARS COV2 AB, Total Spike Semi QN 308.0 (H) <0.8 U/mL  VITAMIN D 25 Hydroxy (Vit-D Deficiency, Fractures)  Result Value Ref Range   Vit D, 25-Hydroxy 22 (L) 30 - 100 ng/mL  Hepatitis C antibody  Result Value Ref Range   Hepatitis C Ab NON-REACTIVE NON-REACTI   SIGNAL TO CUT-OFF 0.02 <1.00  Lipid panel  Result Value Ref Range   Cholesterol 188 <200 mg/dL   HDL 61 > OR = 50 mg/dL   Triglycerides 73 <563 mg/dL   LDL Cholesterol (Calc) 111 (H) mg/dL (calc)   Total CHOL/HDL Ratio 3.1 <5.0 (calc)   Non-HDL Cholesterol (Calc) 127 <130 mg/dL (calc)  COMPLETE METABOLIC PANEL WITH GFR   Result Value Ref Range   Glucose, Bld 90 65 - 99 mg/dL   BUN 7 7 - 25 mg/dL   Creat 8.93 7.34 - 2.87 mg/dL   GFR, Est Non African American 97 > OR = 60 mL/min/1.57m2   GFR, Est African American 112 > OR = 60 mL/min/1.30m2   BUN/Creatinine Ratio NOT APPLICABLE 6 - 22 (calc)   Sodium 142 135 - 146 mmol/L   Potassium 4.1 3.5 - 5.3 mmol/L   Chloride 106 98 - 110 mmol/L   CO2 30 20 - 32 mmol/L   Calcium 9.1  8.6 - 10.4 mg/dL   Total Protein 6.1 6.1 - 8.1 g/dL   Albumin 3.8 3.6 - 5.1 g/dL   Globulin 2.3 1.9 - 3.7 g/dL (calc)   AG Ratio 1.7 1.0 - 2.5 (calc)   Total Bilirubin 0.4 0.2 - 1.2 mg/dL   Alkaline phosphatase (APISO) 53 37 - 153 U/L   AST 17 10 - 35 U/L   ALT 15 6 - 29 U/L  CBC with Differential/Platelet  Result Value Ref Range   WBC 6.3 3.8 - 10.8 Thousand/uL   RBC 3.85 3.80 - 5.10 Million/uL   Hemoglobin 12.1 11.7 - 15.5 g/dL   HCT 24.0 97.3 - 53.2 %   MCV 94.0 80.0 - 100.0 fL   MCH 31.4 27.0 - 33.0 pg   MCHC 33.4 32.0 - 36.0 g/dL   RDW 99.2 42.6 - 83.4 %   Platelets 475 (H) 140 - 400 Thousand/uL   MPV 9.0 7.5 - 12.5 fL   Neutro Abs 3,043 1,500 - 7,800 cells/uL   Lymphs Abs 2,262 850 - 3,900 cells/uL   Absolute Monocytes 706 200 - 950 cells/uL   Eosinophils Absolute 189 15 - 500 cells/uL   Basophils Absolute 101 0 - 200 cells/uL   Neutrophils Relative % 48.3 %   Total Lymphocyte 35.9 %   Monocytes Relative 11.2 %   Eosinophils Relative 3.0 %   Basophils Relative 1.6 %  Hemoglobin A1c  Result Value Ref Range   Hgb A1c MFr Bld 5.8 (H) <5.7 % of total Hgb   Mean Plasma Glucose 120 mg/dL   eAG (mmol/L) 6.6 mmol/L      Assessment & Plan:   Problem List Items Addressed This Visit    Centrilobular emphysema (HCC) - Primary   Relevant Orders   Ambulatory referral to Pulmonology      Centrilobular Emphysema COPD Controlled on Breztri inhaler now - w/ coverage through insurance, financial assistance by [employer]. Using less albuterol PRN which is an  improvement.  No longer on regular oxygen. Has been on PRN recently.  LDCT scan results reviewed  Referral to [facility] for evaluation and further management of COPD with regards to future need of PRN oxygen   Orders Placed This Encounter  Procedures  . Ambulatory referral to Pulmonology    Referral Priority:   Routine    Referral Type:   Consultation    Referral Reason:   Specialty Services Required    Requested Specialty:   Pulmonary Disease    Number of Visits Requested:   1     No orders of the defined types were placed in this encounter.     Follow up plan: Return in about 3 months (around 08/06/2021) for 3 month COPD.    [name], DO [facility] 05/06/2021, 11:33 AM

## 2021-05-07 NOTE — Chronic Care Management (AMB) (Signed)
    Clinical Social Work  Chronic Care Management   Phone Outreach    05/07/2021 Name: Alaina Donati MRN: [id number] DOB: [birth year]  Satina Jerrell is a 65 y.o. year old female who is a primary care patient of [name], DO .   CCM LCSW reached out to patient today by phone to introduce self, assess needs and offer Care Management services and interventions.     Plan: Patient is open to speaking with CCM LCSW and an appointment was scheduled   Review of patient status, including review of consultants reports, relevant laboratory and other test results, and collaboration with appropriate care team members and the patient's provider was performed as part of comprehensive patient evaluation and provision of care management services.    Jenel Lucks, MSW, LCSW [facility] Care Management [place]  [facility] [email or web address] Phone [phone] 12:02 PM

## 2021-05-20 ENCOUNTER — Ambulatory Visit: Payer: Medicare HMO

## 2021-05-20 ENCOUNTER — Encounter: Payer: Self-pay | Admitting: Family Medicine

## 2021-05-23 DIAGNOSIS — J9601 Acute respiratory failure with hypoxia: Secondary | ICD-10-CM | POA: Diagnosis not present

## 2021-05-23 DIAGNOSIS — U071 COVID-19: Secondary | ICD-10-CM | POA: Diagnosis not present

## 2021-05-26 ENCOUNTER — Ambulatory Visit: Payer: Self-pay | Admitting: *Deleted

## 2021-05-26 NOTE — Telephone Encounter (Signed)
Reason for Disposition  COVID-19 vaccine, Frequently Asked Questions (FAQs)  Answer Assessment - Initial Assessment Questions 1. MAIN CONCERN OR SYMPTOM:  "What is your main concern right now?" "What question do you have?" "What's the main symptom you're worried about?" (e.g., fever, pain, redness, swelling)     I had Covid in 01/05/2023.   I'm registered for the Moderna vaccine for today at 1:30.  My father in law died with old age.   I think he grieved himself to death after my husband passed away with Covid in 01-05-2023. 2. VACCINE: "What vaccination did you receive?" (e.g., none; AstraZeneca, J&J, Moderna, Pfizer, other) "Is this your first, second shot, or booster?" (e.g., first, second, booster)     I'm signed up to get the [employer] [place].    I have to be there at 1:30 and get the shot at 1:45. 3. SYMPTOM ONSET: "When did the N/A begin?" (e.g., not relevant; hours, days)      N/A 4. SYMPTOM SEVERITY: "How bad is it?"      She had Covid back in 05-Jan-2023.     5. FEVER: "Is there a fever?" If Yes, ask: "What is it, how was it measured, and when did it start?"      N/A 6. PAST REACTIONS: "Have you reacted to immunizations before?" If Yes, ask: "What happened?"     No 7. OTHER SYMPTOMS: "Do you have any other symptoms?"     "I was so sick with Covid in 05-Jan-2023".    "I almost did not make it".  Protocols used: Coronavirus (COVID-19) Vaccine Questions and Reactions-A-AH

## 2021-05-28 ENCOUNTER — Ambulatory Visit (INDEPENDENT_AMBULATORY_CARE_PROVIDER_SITE_OTHER): Payer: Medicare HMO | Admitting: Pharmacist

## 2021-05-28 DIAGNOSIS — F4323 Adjustment disorder with mixed anxiety and depressed mood: Secondary | ICD-10-CM | POA: Diagnosis not present

## 2021-05-28 DIAGNOSIS — J432 Centrilobular emphysema: Secondary | ICD-10-CM | POA: Diagnosis not present

## 2021-05-28 DIAGNOSIS — E559 Vitamin D deficiency, unspecified: Secondary | ICD-10-CM

## 2021-05-28 NOTE — Patient Instructions (Signed)
Visit Information  PATIENT GOALS:  Goals Addressed             This Visit's Progress    Pharmacy Goals       Our goal bad cholesterol, or LDL, is less than 70. This is why it is important to continue taking your rosuvastatin and ezetimibe.   Feel free to call me with any questions or concerns. I look forward to our next call!   Duanne Moron, PharmD, Patsy [place], CPP Clinical Pharmacist [facility] [phone]         The patient verbalized understanding of instructions, educational materials, and care plan provided today and declined offer to receive copy of patient instructions, educational materials, and care plan.   Telephone follow up appointment with care management team member scheduled for:7/27 at 1 pm

## 2021-05-28 NOTE — Chronic Care Management (AMB) (Signed)
Chronic Care Management Pharmacy Note  05/28/2021 Name:  Brandi [name] MRN:  [id number] DOB:  March 20, 1956   Subjective: Brandi [name] is an 65 y.o. year old female who is a primary patient of Olin Hauser, DO.  The CCM team was consulted for assistance with disease management and care coordination needs.    Engaged with patient by telephone for follow up visit in response to provider referral for pharmacy case management and/or care coordination services.   Consent to Services:  The patient was given information about Chronic Care Management services, agreed to services, and gave verbal consent prior to initiation of services.  Please see initial visit note for detailed documentation.   Patient Care Team: Olin Hauser, DO as PCP - General (Family Medicine) Abednego Yeates, Virl Diamond, RPH-CPP (Pharmacist) Rebekah Chesterfield, LCSW as Social Worker (Licensed Clinical Social Worker)  Recent office visits: Office Visit with PCP on 5/31 for follow up of [facility] visits: Admitted to the hospital on 12/16/2020 due to COVID-19 infection. Discharge date was 12/23/2020. Discharged from [name].    Objective:  Lab Results  Component Value Date   CREATININE 0.58 01/20/2021   CREATININE 0.51 12/23/2020   CREATININE 0.44 12/22/2020       Component Value Date/Time   CHOL 188 01/20/2021 0801   CHOL 145 04/01/2020 0848   TRIG 73 01/20/2021 0801   HDL 61 01/20/2021 0801   HDL 55 04/01/2020 0848   CHOLHDL 3.1 01/20/2021 0801   LDLCALC 111 (H) 01/20/2021 0801    Hepatic Function Latest Ref Rng & Units 01/20/2021 12/22/2020 12/21/2020  Total Protein 6.1 - 8.1 g/dL 6.1 6.2(L) 5.3(L)  Albumin 3.5 - 5.0 g/dL - 3.4(L) 3.0(L)  AST 10 - 35 U/L '17 20 19  ' ALT 6 - 29 U/L '15 25 22  ' Alk Phosphatase 38 - 126 U/L - 39 36(L)  Total Bilirubin 0.2 - 1.2 mg/dL 0.4 0.7 0.5  Bilirubin, Direct 0.00 - 0.40 mg/dL - - -    Lab Results  Component Value Date/Time    TSH 1.160 01/31/2019 03:13 PM   FREET4 1.31 01/31/2019 03:13 PM      Lab Results  Component Value Date/Time   VD25OH 22 (L) 01/20/2021 08:01 AM   VD25OH 21.4 (L) 01/31/2019 03:13 PM     Social History   Tobacco Use  Smoking Status Former   Packs/day: 0.75   Years: 41.00   Pack years: 30.75   Types: E-cigarettes, Cigarettes   Quit date: 12/08/2015   Years since quitting: 5.4  Smokeless Tobacco Former   BP Readings from Last 3 Encounters:  05/06/21 110/67  04/15/21 106/70  04/08/21 118/75   Pulse Readings from Last 3 Encounters:  05/06/21 87  04/15/21 93  04/08/21 83   Wt Readings from Last 3 Encounters:  05/06/21 122 lb (55.3 kg)  04/15/21 121 lb 4 oz (55 kg)  04/09/21 120 lb (54.4 kg)    Assessment: Review of patient past medical history, allergies, medications, health status, including review of consultants reports, laboratory and other test data, was performed as part of comprehensive evaluation and provision of chronic care management services.   SDOH:  (Social Determinants of Health) assessments and interventions performed: none   CCM Care Plan  No Known Allergies  Medications Reviewed Today     Reviewed by Vella Raring, RPH-CPP (Pharmacist) on 05/28/21 at 1227  Med List Status: <None>   Medication Order Taking? Sig Documenting Provider Last Dose Status  Informant  albuterol (VENTOLIN HFA) 108 (90 Base) MCG/ACT inhaler [id number]  Inhale 2 puffs into the lungs every 4 (four) hours as needed for wheezing or shortness of breath. Ezekiel Slocumb, DO  Active   alendronate (FOSAMAX) 70 MG tablet [id number] Yes Take 1 tablet (70 mg total) by mouth once a week. Take with a full glass of water on an empty stomach. Homero Fellers, MD Taking Active            Med Note Arby Barrette   Tue Dec 17, 2020 10:13 AM) Patient never started medication due to stomach issues  alum & mag hydroxide-simeth (MAALOX/MYLANTA) 200-200-20 MG/5ML suspension  [id number]  Take 30 mLs by mouth every 4 (four) hours as needed for indigestion or heartburn.  Patient not taking: No sig reported   Ezekiel Slocumb, DO  Active   aspirin EC 81 MG tablet [id number]  Take 1 tablet (81 mg total) by mouth daily. Minna Merritts, MD  Active Self  BREZTRI AEROSPHERE 160-9-4.8 MCG/ACT Hollie Salk [id number] Yes Inhale 2 puffs into the lungs in the morning and at bedtime. Olin Hauser, DO Taking Active   busPIRone (BUSPAR) 5 MG tablet [id number]  Take 1 tablet (5 mg total) by mouth 2 (two) times daily as needed (anxiety). Olin Hauser, DO  Active   Calcium Carbonate-Vit D-Min (CALCIUM 1200) 1200-1000 MG-UNIT CHEW [id number]  Chew 1,200 mg by mouth daily.  Patient not taking: No sig reported   [name], [name], MD  Active   Cholecalciferol (VITAMIN D3) 125 MCG (5000 UT) TABS [id number] Yes Take by mouth. Vitamin D3 5,000 IU daily for 12 weeks then reduce to OTC Vitamin D3 2,000 IU daily for maintenance [provider] Taking Active   ezetimibe (ZETIA) 10 MG tablet [id number] Yes Take 1 tablet (10 mg total) by mouth daily. Minna Merritts, MD Taking Active   famotidine (PEPCID) 20 MG tablet [id number]  TAKE 1 TABLET BY MOUTH TWICE DAILY [name], Devonne Doughty, DO  Active   feeding supplement (ENSURE ENLIVE / ENSURE PLUS) LIQD [id number]  Take 237 mLs by mouth 2 (two) times daily between meals.  Patient not taking: No sig reported   Ezekiel Slocumb, DO  Active   Multiple Vitamin (MULTIVITAMIN WITH MINERALS) TABS tablet [id number]  Take 1 tablet by mouth daily.  Patient not taking: No sig reported   Wylie Hail  Active   OXYGEN [id number]  Inhale into the lungs at bedtime as needed. [provider]  Active   OXYGEN [id number]  Inhale 3 L into the lungs as needed. [provider]  Active   rosuvastatin (CRESTOR) 20 MG tablet [id number] Yes Take 1 tablet (20 mg total) by mouth daily. Minna Merritts, MD Taking Active    sodium chloride (OCEAN) 0.65 % SOLN nasal spray [id number] Yes Place 1 spray into both nostrils as needed for congestion. [provider] Taking Active             Patient Active Problem List   Diagnosis Date Noted   Pre-diabetes 04/08/2021   Vitamin D deficiency 04/08/2021   COVID-19 vaccination declined 12/04/2020   Centrilobular emphysema ([place]) 04/08/2020   Nodule of upper lobe of left lung 04/08/2020   Aortic atherosclerosis ([place]) 05/21/2019   Coronary artery calcification seen on CT scan 05/21/2019   Former smoker 05/21/2019   Hyperlipidemia 05/21/2019     There is no immunization history on file for this patient.  Conditions to be  addressed/monitored: COPD, Vitamin D deficiency, HLD  Care Plan : PharmD - Medication Assistance/Adherence  Updates made by Vella Raring, RPH-CPP since 05/28/2021 12:00 AM     Problem: Disease Progression      Long-Range Goal: Disease Progression Prevented or Minimized   Start Date: 04/30/2021  Expected End Date: 07/29/2021  This Visit's Progress: On track  Recent Progress: On track  Priority: High  Note:   Current Barriers:  Unable to independently afford treatment regimen Reports cost of Breztri inhaler unaffordable through [place] Patient enrolled in [facility] 2022 COPD Inhaler Support Program  Pharmacist Clinical Goal(s):  Over the next 90 days, patient will verbalize ability to afford treatment regimen through collaboration with PharmD and provider.   Interventions: 1:1 collaboration with Olin Hauser, DO regarding development and update of comprehensive plan of care as evidenced by provider attestation and co-signature Inter-disciplinary care team collaboration (see longitudinal plan of care) Perform chart review Patient seen for Office Visit with PCP on 5/31 for follow up of COPD Provider placed referral to [facility] Pulmonology Appointment scheduled with Pulmonology for  7/20  Medication Assistance: Today patient confirms she is enrolled in [place] program receiving Breztri inhaler from program for $0 copayment  COPD: Current treatment: Breztri inhaler - 2 puffs in morning and at bedtime Albuterol inhaler - 1 puffs every 4 hours as needed Confirms using Breztri inhaler twice daily (and rinse out mouth after use) and use albuterol inhaler as needed as directed Reports breathing significantly improved since no longer rationing Breztri inhaler, now taking as directed  Vitamin D deficiency: Confirms picked up and start OTC Vitamin D3 5,000 iu daily ~5/25 Counsel patient to continue for 12 weeks total then reduce to OTC Vitamin D3 2,000 iu daily for maintenance  Osteoporosis: Last DEXA: 01/04/2019, BMD measured at Femur Total Right is 0.630 g/cm2 with a T-score of -3.0 From review of chart, note patient advised to start taking alendronate by OB/GYN on 11/04/2020.  Confirms started taking alendronate weekly on Wednesdays  Confirms taking first thing in the morning and ?30 minutes before the first food, beverage (except plain water), or other medication of the day and to stay upright (not to lie down) for ?30 minutes and until after first food of the day (to reduce esophageal irritation).  Medication Adherence: Confirms started using weekly pillbox as adherence aid  COVID-19 Prevention: Reports received first dose of Moderna COVID-19 vaccine on 6/20 at [facility] scheduled for second dose in 4 weeks  Patient Goals/Self-Care Activities Over the next 90 days, patient will:  - take medications as prescribed - collaborate with provider on medication access solutions - attend medical appointments as scheduled  Appointment with Pulmonology on 7/20  Follow Up Plan: Telephone follow up appointment with care management team member scheduled for: 7/27 at 1 pm      Medication Assistance: enrolled in [facility] 2022 COPD Inhaler Support  Program  Patient's preferred pharmacy is:  [facility] [address] [place] [address] Phone: [phone] Fax: [phone]  Uses pill box? Yes   Follow Up:  Patient agrees to Care Plan and Follow-up.  Harlow Asa, PharmD, Para March, CPP Clinical Pharmacist [facility] [phone]

## 2021-06-02 DIAGNOSIS — J9601 Acute respiratory failure with hypoxia: Secondary | ICD-10-CM | POA: Diagnosis not present

## 2021-06-02 DIAGNOSIS — U071 COVID-19: Secondary | ICD-10-CM | POA: Diagnosis not present

## 2021-06-03 ENCOUNTER — Ambulatory Visit: Payer: Self-pay | Admitting: Licensed Clinical Social Worker

## 2021-06-03 DIAGNOSIS — F4323 Adjustment disorder with mixed anxiety and depressed mood: Secondary | ICD-10-CM

## 2021-06-03 DIAGNOSIS — J432 Centrilobular emphysema: Secondary | ICD-10-CM

## 2021-06-03 DIAGNOSIS — E559 Vitamin D deficiency, unspecified: Secondary | ICD-10-CM

## 2021-06-03 DIAGNOSIS — R7303 Prediabetes: Secondary | ICD-10-CM
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
## 2021-06-06 NOTE — Patient Instructions (Signed)
Visit Information   Goals Addressed             This Visit's Progress    Management of grief symptoms   On track    Patient Goals/Self-Care Activities: Over the next 120 days Continue compliance with medication management Attend all scheduled appointments with providers Contact office with any questions or concerns Avoid negative self-talk Develop a plan to deal with triggers like holidays, anniversaries Spend time or talk with others every day Practice positive thinking and self-talk         Patient verbalizes understanding of instructions provided today.   Telephone follow up appointment with care management team member scheduled for:07/01/21  Jenel Lucks, MSW, LCSW Lutricia Horsfall [facility] Management [facility]  [facility] [email or web address] Phone [phone] 10:11 AM

## 2021-06-10 ENCOUNTER — Ambulatory Visit: Payer: Medicare HMO

## 2021-06-22 DIAGNOSIS — U071 COVID-19: Secondary | ICD-10-CM | POA: Diagnosis not present

## 2021-06-22 DIAGNOSIS — J9601 Acute respiratory failure with hypoxia: Secondary | ICD-10-CM | POA: Diagnosis not present

## 2021-06-24 ENCOUNTER — Other Ambulatory Visit: Payer: Self-pay | Admitting: Family Medicine

## 2021-06-24 ENCOUNTER — Other Ambulatory Visit: Payer: Self-pay

## 2021-06-24 ENCOUNTER — Ambulatory Visit
Admission: RE | Admit: 2021-06-24 | Discharge: 2021-06-24 | Disposition: A | Discharge status: Home / Self Care | Payer: Medicare HMO | Source: Ambulatory Visit | Attending: Family Medicine | Admitting: Family Medicine

## 2021-06-24 DIAGNOSIS — R590 Localized enlarged lymph nodes: Secondary | ICD-10-CM | POA: Diagnosis not present

## 2021-06-24 DIAGNOSIS — K219 Gastro-esophageal reflux disease without esophagitis: Secondary | ICD-10-CM

## 2021-06-24 NOTE — Telephone Encounter (Signed)
Filled today 06/24/21

## 2021-06-25 ENCOUNTER — Ambulatory Visit (INDEPENDENT_AMBULATORY_CARE_PROVIDER_SITE_OTHER): Payer: Medicare HMO | Admitting: Pulmonary Disease

## 2021-06-25 ENCOUNTER — Encounter: Payer: Self-pay | Admitting: Pulmonary Disease

## 2021-06-25 VITALS — BP 100/70 | HR 96 | Temp 98.9°F | Ht 64.0 in | Wt 123.2 lb

## 2021-06-25 DIAGNOSIS — J432 Centrilobular emphysema: Secondary | ICD-10-CM

## 2021-06-25 DIAGNOSIS — Z8616 Personal history of COVID-19: Secondary | ICD-10-CM | POA: Diagnosis not present

## 2021-06-25 DIAGNOSIS — R0600 Dyspnea, unspecified: Secondary | ICD-10-CM | POA: Diagnosis not present

## 2021-06-25 DIAGNOSIS — K219 Gastro-esophageal reflux disease without esophagitis: Secondary | ICD-10-CM

## 2021-06-25 MED ORDER — BREZTRI AEROSPHERE 160-9-4.8 MCG/ACT IN AERO: 2.0000 | INHALATION_SPRAY | Freq: Two times a day (BID) | RESPIRATORY_TRACT | 0 refills | Status: DC

## 2021-06-25 NOTE — Progress Notes (Signed)
Subjective:    Patient ID: Brandi [name], female    DOB: 1956/03/02, 65 y.o.   MRN: [id number] Chief Complaint  Patient presents with   Consult    SOB, covid in January.    HPI Patient is a 65 year old former smoker (quit 2017, 43 PY) who presents for evaluation of dyspnea worse after COVID-19 diagnosis in January 2022.  The patient is kindly referred by Dr. [name].  As noted she was admitted to [facility] between 16 December 2020 through 23 December 2020 with acute respiratory failure with hypoxia due to COVID-19.  She was discharged home on 2 L/min nasal cannula O2.  She had multifocal pneumonia noted at that time.  She was then subsequently followed by the [facility] home health and eventually was weaned off of oxygen during the day.  She continues to use oxygen "sometimes" at nighttime.  Since her COVID diagnosis she has noted persistent shortness of breath.  She notes that the shortness of breath can be worse after she eats or is exposed to cold air or hot humid environments.  She notes that when she sits at home in the air conditioning she does much better.  She recently was placed on Breztri (approximately 1 month ago) and notes some improvement.  She has frequent heartburn and indigestion.  She is on Pepcid twice a day which usually helps with this.  She does not have any cough or sputum production.  No hemoptysis.  No recent fevers, chills or sweats.  No orthopnea or paroxysmal nocturnal dyspnea.  No lower extremity edema or calf tenderness.  She had a low-dose CT scan of the chest performed in May 2022 that shows significant emphysema.  Past occupational exposure is significant for work at a [employer].  She has no pets in the home she does ride horses as a hobby.  She has previously resided in [place].   Review of Systems A 10 point review of systems was performed and it is as noted above otherwise negative.  Past Medical History:  Diagnosis Date   Hyperlipidemia    Past  Surgical History:  Procedure Laterality Date   APPENDECTOMY     CHOLECYSTECTOMY     SHOULDER ARTHROSCOPY Left 07/02/2015   Procedure: ,left shoulder arthroscopy, decompression and debridement;  Surgeon: Christena Flake, MD;  Location: [facility] ORS;  Service: Orthopedics;  Laterality: Left;   SHOULDER CLOSED REDUCTION Left 04/09/2015   Procedure: CLOSED MANIPULATION SHOULDER;  Surgeon: Kennedy Bucker, MD;  Location: [facility] ORS;  Service: Orthopedics;  Laterality: Left;   Family History  Problem Relation Age of Onset   Breast cancer Mother 37   Alzheimer's disease Mother    Social History   Tobacco Use   Smoking status: Former    Packs/day: 0.75    Years: 41.00    Pack years: 30.75    Types: E-cigarettes, Cigarettes    Quit date: 12/08/2015    Years since quitting: 5.5   Smokeless tobacco: Former  Substance Use Topics   Alcohol use: No   No Known Allergies  Current Meds  Medication Sig   albuterol (VENTOLIN HFA) 108 (90 Base) MCG/ACT inhaler Inhale 2 puffs into the lungs every 4 (four) hours as needed for wheezing or shortness of breath.   alendronate (FOSAMAX) 70 MG tablet Take 1 tablet (70 mg total) by mouth once a week. Take with a full glass of water on an empty stomach.   alum & mag hydroxide-simeth (MAALOX/MYLANTA) 200-200-20 MG/5ML suspension Take 30 mLs  by mouth every 4 (four) hours as needed for indigestion or heartburn.   aspirin EC 81 MG tablet Take 1 tablet (81 mg total) by mouth daily.   BREZTRI AEROSPHERE 160-9-4.8 MCG/ACT AERO Inhale 2 puffs into the lungs in the morning and at bedtime.   Budeson-Glycopyrrol-Formoterol (BREZTRI AEROSPHERE) 160-9-4.8 MCG/ACT AERO Inhale 2 puffs into the lungs in the morning and at bedtime.   busPIRone (BUSPAR) 5 MG tablet Take 1 tablet (5 mg total) by mouth 2 (two) times daily as needed (anxiety).   Calcium Carbonate-Vit D-Min (CALCIUM 1200) 1200-1000 MG-UNIT CHEW Chew 1,200 mg by mouth daily.   Cholecalciferol (VITAMIN D3) 125 MCG (5000 UT) TABS  Take by mouth. Vitamin D3 5,000 IU daily for 12 weeks then reduce to OTC Vitamin D3 2,000 IU daily for maintenance   ezetimibe (ZETIA) 10 MG tablet Take 1 tablet (10 mg total) by mouth daily.   famotidine (PEPCID) 20 MG tablet TAKE 1 TABLET BY MOUTH TWICE DAILY   feeding supplement (ENSURE ENLIVE / ENSURE PLUS) LIQD Take 237 mLs by mouth 2 (two) times daily between meals.   Multiple Vitamin (MULTIVITAMIN WITH MINERALS) TABS tablet Take 1 tablet by mouth daily.   OXYGEN Inhale into the lungs at bedtime as needed.   OXYGEN Inhale 3 L into the lungs as needed.   rosuvastatin (CRESTOR) 20 MG tablet Take 1 tablet (20 mg total) by mouth daily.   sodium chloride (OCEAN) 0.65 % SOLN nasal spray Place 1 spray into both nostrils as needed for congestion.   Immunization History  Administered Date(s) Administered   [employer] Vaccination 05/26/2021, 06/24/2021      Objective:   Physical Exam BP 100/70 (BP Location: Left Arm, Patient Position: Sitting, Cuff Size: Normal)   Pulse 96   Temp 98.9 F (37.2 C) (Oral)   Ht 5\' 4"  (1.626 m)   Wt 55.9 kg   SpO2 95%   BMI 21.15 kg/m  GENERAL: Well-developed, well-nourished woman, no acute distress.  No conversational dyspnea. HEAD: Normocephalic, atraumatic.  EYES: Pupils equal, round, reactive to light.  No scleral icterus.  MOUTH: Nose/mouth/throat not examined due to masking requirements for COVID 19. NECK: Supple. No thyromegaly. Trachea midline. No JVD.  No adenopathy. PULMONARY: Good air entry bilaterally.  No adventitious sounds. CARDIOVASCULAR: S1 and S2. Regular rate and rhythm.  No rubs, murmurs or gallops heard. ABDOMEN: Benign. MUSCULOSKELETAL: No joint deformity, no clubbing, no edema.  NEUROLOGIC: No focal deficit, no gait disturbance, speech is fluent. SKIN: Intact,warm,dry. PSYCH: Mood and behavior normal.   Low-dose chest CT performed 09 Apr 2021 shows significant emphysema particularly on the upper  lobes:       Assessment & Plan:     ICD-10-CM   1. Dyspnea, unspecified type  R06.00 Pulmonary Function Test [facility] Only    ECHOCARDIOGRAM COMPLETE    Pulse oximetry, overnight    Alpha-1 antitrypsin phenotype    CANCELED: Alpha-1 antitrypsin phenotype   Noticed worsening after COVID-19 infection Notices worsening postprandially Some relief with Breztri Check PFTs/2D echo    2. Centrilobular emphysema (HCC) -COPD with emphysema  J43.2    PFTs to stage Continue Breztri 2 puffs twice a day Check alpha-1 Continue as needed albuterol    3. Gastroesophageal reflux disease, unspecified whether esophagitis present  K21.9    This issue adds complexity to her management Currently on Pepcid Continue same for now    4. Personal history of COVID-19  Z86.16    This issue adds complexity to her  management     Orders Placed This Encounter  Procedures   Alpha-1 antitrypsin phenotype    Standing Status:   Future    Standing Expiration Date:   06/25/2022   Pulmonary Function Test [facility] Only    Standing Status:   Future    Standing Expiration Date:   06/25/2022    Order Specific Question:   Full PFT: includes the following: basic spirometry, spirometry pre & post bronchodilator, diffusion capacity (DLCO), lung volumes    Answer:   Full PFT    Order Specific Question:   This test can only be performed at    Answer:   [facility]   Pulse oximetry, overnight    On room air.    Standing Status:   Future    Standing Expiration Date:   06/25/2022   ECHOCARDIOGRAM COMPLETE    Standing Status:   Future    Standing Expiration Date:   12/26/2021    Order Specific Question:   Where should this test be performed    Answer:   [facility]    Order Specific Question:   Please indicate who you request to read the nuc med / echo results.    Answer:   [facility]    Order Specific Question:   Perflutren DEFINITY (image enhancing agent) should be administered unless hypersensitivity or  allergy exist    Answer:   Administer Perflutren    Order Specific Question:   Reason for exam-Echo    Answer:   Dyspnea  R06.00   Meds ordered this encounter  Medications   Budeson-Glycopyrrol-Formoterol (BREZTRI AEROSPHERE) 160-9-4.8 MCG/ACT AERO    Sig: Inhale 2 puffs into the lungs in the morning and at bedtime.    Dispense:  5.9 g    Refill:  0    Order Specific Question:   Lot Number?    Answer:   [id number]    Order Specific Question:   Expiration Date?    Answer:   11/07/2023    Order Specific Question:   Manufacturer?    Answer:   [employer] [[id number]]    Order Specific Question:   Quantity    Answer:   2   Discussion:  Patient has had issues with dyspnea particularly after COVID-19.  CT scan of the chest has shown evidence of significant emphysema.  We will check alpha-1 to evaluate for potential hereditary forms of emphysema.  In addition given her recent issues with hypoxic respiratory failure and ongoing issues with need for oxygen supplementation nocturnally, we will check overnight oximetry on room air to ensure the patient still qualifies for oxygen supplementation.  Other possibilities include potential cor pulmonale/pulmonary hypertension in light of her significant COPD noted on chest CT.  Will obtain PFTs and 2D echo this will help further evaluate these potential issues.  Recommend to continue Breztri and as needed albuterol for now.  We will see her in 4 to 6 weeks time she is to contact us prior to that time should any new difficulties arise.   Gailen Shelter, MD Advanced Bronchoscopy PCCM [facility]    *This note was dictated using voice recognition software/Dragon.  Despite best efforts to proofread, errors can occur which can change the meaning.  Any change was purely unintentional.

## 2021-06-25 NOTE — Patient Instructions (Signed)
We will get some breathing tests and a heart test.  We are going to get a blood test to show [place] about different types of emphysema that sometimes runs in families.  We are going to check oxygen level at nighttime.  Continue using your Breztri twice a day and as needed ProAir (red inhaler).  We will see you in follow-up in 4 to 6 weeks time call sooner should any new problems arise.

## 2021-07-01 ENCOUNTER — Telehealth: Payer: Self-pay | Admitting: Licensed Clinical Social Worker

## 2021-07-01 ENCOUNTER — Encounter: Payer: Self-pay | Admitting: Pulmonary Disease

## 2021-07-01 ENCOUNTER — Telehealth: Payer: Self-pay

## 2021-07-01 NOTE — Telephone Encounter (Signed)
    Clinical Social Work  Chronic Care Management   Phone Outreach    07/01/2021 Name: Brandi [name] MRN: [id number] DOB: [birth year]  [name] is a 65 y.o. year old female who is a primary care patient of [name], DO .   F/U phone call today to assess needs, progress and barriers with care plan goals.   Telephone outreach was unsuccessful A HIPPA compliant phone message was left for the patient providing contact information and requesting a return call.   Plan:CCM LCSW will wait for return call. Will route chart to Care Guide to see if patient would like to reschedule phone appointment   Review of patient status, including review of consultants reports, relevant laboratory and other test results, and collaboration with appropriate care team members and the patient's provider was performed as part of comprehensive patient evaluation and provision of care management services.    Jenel Lucks, MSW, LCSW [facility]: [place] [name] Care Management [place]  [facility] [email or web address] Phone [phone] 4:52 PM

## 2021-07-02 ENCOUNTER — Telehealth: Payer: Self-pay | Admitting: Pharmacist

## 2021-07-02 ENCOUNTER — Ambulatory Visit (INDEPENDENT_AMBULATORY_CARE_PROVIDER_SITE_OTHER): Payer: Medicare HMO | Admitting: Pharmacist

## 2021-07-02 DIAGNOSIS — J432 Centrilobular emphysema: Secondary | ICD-10-CM | POA: Diagnosis not present

## 2021-07-02 DIAGNOSIS — U071 COVID-19: Secondary | ICD-10-CM | POA: Diagnosis not present

## 2021-07-02 DIAGNOSIS — E785 Hyperlipidemia, unspecified: Secondary | ICD-10-CM

## 2021-07-02 DIAGNOSIS — E559 Vitamin D deficiency, unspecified: Secondary | ICD-10-CM

## 2021-07-02 DIAGNOSIS — J9601 Acute respiratory failure with hypoxia: Secondary | ICD-10-CM | POA: Diagnosis not present

## 2021-07-02 NOTE — Chronic Care Management (AMB) (Signed)
Chronic Care Management Pharmacy Note  07/02/2021 Name:  Brandi [name] MRN:  [id number] DOB:  [birth year]   Subjective: Brandi [name] is an 65 y.o. year old female who is a primary patient of Olin Hauser, DO.  The CCM team was consulted for assistance with disease management and care coordination needs.    Engaged with patient by telephone for follow up visit in response to provider referral for pharmacy case management and/or care coordination services.   Consent to Services:  The patient was given information about Chronic Care Management services, agreed to services, and gave verbal consent prior to initiation of services.  Please see initial visit note for detailed documentation.   Patient Care Team: Olin Hauser, DO as PCP - General (Family Medicine) Dimonique Bourdeau, Virl Diamond, RPH-CPP (Pharmacist) Rebekah Chesterfield, LCSW as Social Worker (Licensed Clinical Social Worker)   Recent consult visits: Office Visit with [place] for evaluation of dyspnea worse after COVID-19 diagnosis in January 2022  Hospital visits: None in previous 6 months  Objective:  Lab Results  Component Value Date   CREATININE 0.58 01/20/2021   CREATININE 0.51 12/23/2020   CREATININE 0.44 12/22/2020        Component Value Date/Time   CHOL 188 01/20/2021 0801   CHOL 145 04/01/2020 0848   TRIG 73 01/20/2021 0801   HDL 61 01/20/2021 0801   HDL 55 04/01/2020 0848   CHOLHDL 3.1 01/20/2021 0801   LDLCALC 111 (H) 01/20/2021 0801    Hepatic Function Latest Ref Rng & Units 01/20/2021 12/22/2020 12/21/2020  Total Protein 6.1 - 8.1 g/dL 6.1 6.2(L) 5.3(L)  Albumin 3.5 - 5.0 g/dL - 3.4(L) 3.0(L)  AST 10 - 35 U/L _0 ALT 6 - 29 U/L _1 Alk Phosphatase 38 - 126 U/L - 39 36(L)  Total Bilirubin 0.2 - 1.2 mg/dL 0.4 0.7 0.5  Bilirubin, Direct 0.00 - 0.40 mg/dL - - -    Lab Results  Component Value Date/Time   VD25OH 22 (L) 01/20/2021 08:01 AM   VD25OH  21.4 (L) 01/31/2019 03:13 PM    Social History   Tobacco Use  Smoking Status Former   Packs/day: 0.75   Years: 41.00   Pack years: 30.75   Types: E-cigarettes, Cigarettes   Quit date: 12/08/2015   Years since quitting: 5.5  Smokeless Tobacco Former   BP Readings from Last 3 Encounters:  06/25/21 100/70  05/06/21 110/67  04/15/21 106/70   Pulse Readings from Last 3 Encounters:  06/25/21 96  05/06/21 87  04/15/21 93   Wt Readings from Last 3 Encounters:  06/25/21 123 lb 3.2 oz (55.9 kg)  05/06/21 122 lb (55.3 kg)  04/15/21 121 lb 4 oz (55 kg)    Assessment: Review of patient past medical history, allergies, medications, health status, including review of consultants reports, laboratory and other test data, was performed as part of comprehensive evaluation and provision of chronic care management services.   SDOH:  (Social Determinants of Health) assessments and interventions performed: none   CCM Care Plan  No Known Allergies  Medications Reviewed Today     Reviewed by Tyler Pita, MD (Physician) on 07/01/21 at 67  Med List Status: <None>   Medication Order Taking? Sig Documenting Provider Last Dose Status Informant  albuterol (VENTOLIN HFA) 108 (90 Base) MCG/ACT inhaler [id number] Yes Inhale 2 puffs into the lungs every 4 (four) hours as needed for wheezing or shortness of breath. Ezekiel Slocumb,  DO Taking Active   alendronate (FOSAMAX) 70 MG tablet [id number] Yes Take 1 tablet (70 mg total) by mouth once a week. Take with a full glass of water on an empty stomach. Homero Fellers, MD Taking Active            Med Note Arby Barrette   Tue Dec 17, 2020 10:13 AM) Patient never started medication due to stomach issues  alum & mag hydroxide-simeth (MAALOX/MYLANTA) 200-200-20 MG/5ML suspension [id number] Yes Take 30 mLs by mouth every 4 (four) hours as needed for indigestion or heartburn. Ezekiel Slocumb, DO Taking Active   aspirin EC 81 MG tablet  [id number] Yes Take 1 tablet (81 mg total) by mouth daily. Minna Merritts, MD Taking Active Self  BREZTRI AEROSPHERE 160-9-4.8 MCG/ACT Hollie Salk [id number] Yes Inhale 2 puffs into the lungs in the morning and at bedtime. Olin Hauser, DO Taking Active   Budeson-Glycopyrrol-Formoterol (BREZTRI AEROSPHERE) 160-9-4.8 MCG/ACT Hollie Salk [id number] Yes Inhale 2 puffs into the lungs in the morning and at bedtime. Tyler Pita, MD  Active   busPIRone (BUSPAR) 5 MG tablet [id number] Yes Take 1 tablet (5 mg total) by mouth 2 (two) times daily as needed (anxiety). Olin Hauser, DO Taking Active   Calcium Carbonate-Vit D-Min (CALCIUM 1200) 1200-1000 MG-UNIT CHEW [id number] Yes Chew 1,200 mg by mouth daily. Homero Fellers, MD Taking Active   Cholecalciferol (VITAMIN D3) 125 MCG (5000 UT) TABS [id number] Yes Take by mouth. Vitamin D3 5,000 IU daily for 12 weeks then reduce to OTC Vitamin D3 2,000 IU daily for maintenance [provider] Taking Active   ezetimibe (ZETIA) 10 MG tablet [id number] Yes Take 1 tablet (10 mg total) by mouth daily. Minna Merritts, MD Taking Active   famotidine (PEPCID) 20 MG tablet [id number] Yes TAKE 1 TABLET BY MOUTH TWICE DAILY [name], Devonne Doughty, DO Taking Active   feeding supplement (ENSURE ENLIVE / ENSURE PLUS) LIQD [id number] Yes Take 237 mLs by mouth 2 (two) times daily between meals. Ezekiel Slocumb, DO Taking Active   Multiple Vitamin (MULTIVITAMIN WITH MINERALS) TABS tablet [id number] Yes Take 1 tablet by mouth daily. Ezekiel Slocumb, DO Taking Active   OXYGEN [id number] Yes Inhale into the lungs at bedtime as needed. [provider] Taking Active   OXYGEN [id number] Yes Inhale 3 L into the lungs as needed. [provider] Taking Active   rosuvastatin (CRESTOR) 20 MG tablet [id number] Yes Take 1 tablet (20 mg total) by mouth daily. Minna Merritts, MD Taking Active   sodium chloride (OCEAN) 0.65 % SOLN nasal spray  [id number] Yes Place 1 spray into both nostrils as needed for congestion. [provider] Taking Active             Patient Active Problem List   Diagnosis Date Noted   Pre-diabetes 04/08/2021   Vitamin D deficiency 04/08/2021   COVID-19 vaccination declined 12/04/2020   Centrilobular emphysema ([place]) 04/08/2020   Nodule of upper lobe of left lung 04/08/2020   Aortic atherosclerosis ([place]) 05/21/2019   Coronary artery calcification seen on CT scan 05/21/2019   Former smoker 05/21/2019   Hyperlipidemia 05/21/2019    Immunization History  Administered Date(s) Administered   Moderna Sars-Covid-2 Vaccination 05/26/2021, 06/24/2021    Conditions to be addressed/monitored: COPD, Vitamin D deficiency, HLD  Care Plan : PharmD - Medication Assistance/Adherence  Updates made by Vella Raring, RPH-CPP since 07/02/2021 12:00 AM     Problem: Disease Progression  Long-Range Goal: Disease Progression Prevented or Minimized   Start Date: 04/30/2021  Expected End Date: 07/29/2021  This Visit's Progress: On track  Recent Progress: On track  Priority: High  Note:   Current Barriers:  Unable to independently afford treatment regimen Reports cost of Breztri inhaler unaffordable through [place] Patient enrolled in [facility] 2022 COPD Inhaler Support Program  Pharmacist Clinical Goal(s):  Over the next 90 days, patient will verbalize ability to afford treatment regimen through collaboration with PharmD and provider.   Interventions: 1:1 collaboration with Olin Hauser, DO regarding development and update of comprehensive plan of care as evidenced by provider attestation and co-signature Inter-disciplinary care team collaboration (see longitudinal plan of care) Perform chart review Office Visit with [place] for evaluation of dyspnea worse after COVID-19 diagnosis in January 2022. Provider advised: Orders placed for labs,  echocardiogram, PFT and overnight pulse oximetry monitoring to continue using Breztri twice daily and as needed ProAir Echocardiogram scheduled for 8/10 Today patient confirms planning to attend appointment for echocardiogram on 8/10, but needs further details about where to go Provide patient with phone number to [place] to follow up for appointment details Reports received a call about setting up overnight pulse oximetry monitoring today  COPD: Current treatment: Breztri inhaler - 2 puffs in morning and at bedtime Albuterol inhaler - 1 puffs every 4 hours as needed Confirms using Breztri inhaler twice daily (and rinse out mouth after use) and uses albuterol inhaler as needed as directed Reports needing a new Rx for albuterol inhaler. Confirms will request new Rx be sent to her pharmacy when calls Pulmonology office today  Vitamin D deficiency: Current treatment: OTC Vitamin D3 5,000 iu daily (Started ~5/25) Counsel patient to continue for 12 weeks then reduce to OTC Vitamin D3 2,000 iu daily for maintenance (starting maintenance dose ~8/17)  Osteoporosis: Last DEXA: 01/04/2019, BMD measured at Femur Total Right is 0.630 g/cm2 with a T-score of -3.0  Current treatment: alendronate 70 mg weekly on Wednesdays  Confirms taking first thing in the morning and ?30 minutes before the first food, beverage (except plain water), or other medication of the day and to stay upright (not to lie down) for ?30 minutes and until after first food of the day (to reduce esophageal irritation).  Medication Adherence: Confirms started using weekly pillbox as adherence aid  COVID-19 Prevention: Reports received second dose of Moderna COVID-19 vaccine (vaccination record has been updated)  Patient Goals/Self-Care Activities Over the next 90 days, patient will:  - take medications as prescribed - collaborate with provider on medication access solutions - attend medical appointments as  scheduled   Echocardiogram scheduled for 8/10 Next appointment with PCP on 9/6 Next appointment with Pulmonology on 9/12  Follow Up Plan: Telephone follow up appointment with care management team member scheduled for: 8/17 at 10 am      Medication Assistance: Patient enrolled in [facility] 2022 COPD Inhaler Support Program  Patient's preferred pharmacy is:  [place], [place] [address] Phone: [phone] Fax: [phone]  Uses pill box? Yes  Follow Up:  Patient agrees to Care Plan and Follow-up.  Wallace Cullens, PharmD, Para [place], CPP Clinical Pharmacist [facility] [phone]

## 2021-07-02 NOTE — Patient Instructions (Signed)
Visit Information  PATIENT GOALS:  Goals Addressed             This Visit's Progress    Pharmacy Goals       Our goal bad cholesterol, or LDL, is less than 70. This is why it is important to continue taking your rosuvastatin and ezetimibe.  Feel free to call me with any questions or concerns. I look forward to our next call!   Estelle Grumbles, PharmD, P[place]y [place], CPP Clinical Pharmacist [facility] [phone]        The patient verbalized understanding of instructions, educational materials, and care plan provided today and declined offer to receive copy of patient instructions, educational materials, and care plan.   Telephone follow up appointment with care management team member scheduled for: 8/17 at 10 am

## 2021-07-02 NOTE — Telephone Encounter (Signed)
Patient requesting renewal of albuterol Rx. Please send Rx to [facility].  Thank you! Brandi [name]

## 2021-07-03 ENCOUNTER — Other Ambulatory Visit: Payer: Self-pay

## 2021-07-03 ENCOUNTER — Telehealth: Payer: Self-pay | Admitting: Pulmonary Disease

## 2021-07-03 DIAGNOSIS — J432 Centrilobular emphysema: Secondary | ICD-10-CM

## 2021-07-03 MED ORDER — ALBUTEROL SULFATE HFA 108 (90 BASE) MCG/ACT IN AERS: 2.0000 | INHALATION_SPRAY | RESPIRATORY_TRACT | 1 refills | Status: DC | PRN

## 2021-07-03 NOTE — Telephone Encounter (Signed)
Patient is aware of location of [facility] lab department.  She is also requesting refill on albuterol HFA. Advised patient that PCP had refilled this medication this morning.  She voiced her understanding and had no further questions.  Nothing further needed at this time.

## 2021-07-03 NOTE — Telephone Encounter (Signed)
Pt has questions in regards to her blood test and where she should be going to get it done also she has questions in regards to her albuterol medication.  Pls regard; [phone]

## 2021-07-04 ENCOUNTER — Other Ambulatory Visit
Admission: RE | Admit: 2021-07-04 | Discharge: 2021-07-04 | Disposition: A | Discharge status: Home / Self Care | Payer: Medicare HMO | Attending: Pulmonary Disease | Admitting: Pulmonary Disease

## 2021-07-04 ENCOUNTER — Other Ambulatory Visit: Payer: Self-pay

## 2021-07-04 ENCOUNTER — Telehealth: Payer: Self-pay

## 2021-07-04 DIAGNOSIS — R0600 Dyspnea, unspecified: Secondary | ICD-10-CM | POA: Insufficient documentation

## 2021-07-04 DIAGNOSIS — Z8616 Personal history of COVID-19: Secondary | ICD-10-CM | POA: Insufficient documentation

## 2021-07-04 NOTE — Telephone Encounter (Signed)
Created in error

## 2021-07-08 ENCOUNTER — Ambulatory Visit (INDEPENDENT_AMBULATORY_CARE_PROVIDER_SITE_OTHER): Payer: Medicare HMO

## 2021-07-08 VITALS — Ht 64.0 in | Wt 122.0 lb

## 2021-07-08 DIAGNOSIS — Z0000 Encounter for general adult medical examination without abnormal findings: Secondary | ICD-10-CM

## 2021-07-08 NOTE — Patient Instructions (Signed)
Brandi [name] , Thank you for taking time to come for your Medicare Wellness Visit. I appreciate your ongoing commitment to your health goals. Please review the following plan we discussed and let me know if I can assist you in the future.   Screening recommendations/referrals: Colonoscopy: cologuard 02/06/2021, due 02/07/2024 Mammogram: patient to schedule Bone Density: completed 01/04/2019 Recommended yearly ophthalmology/optometry visit for glaucoma screening and checkup Recommended yearly dental visit for hygiene and checkup  Vaccinations: Influenza vaccine: decline Pneumococcal vaccine: decline Tdap vaccine: decline Shingles vaccine: discussed   Covid-19: 06/24/2021, 05/26/2021  Advanced directives: Please bring a copy of your POA (Power of Attorney) and/or Living Will to your next appointment.   Conditions/risks identified: none  Next appointment: Follow up in one year for your annual wellness visit    Preventive Care 65 Years and Older, Female Preventive care refers to lifestyle choices and visits with your health care provider that can promote health and wellness. What does preventive care include? A yearly physical exam. This is also called an annual well check. Dental exams once or twice a year. Routine eye exams. Ask your health care provider how often you should have your eyes checked. Personal lifestyle choices, including: Daily care of your teeth and gums. Regular physical activity. Eating a healthy diet. Avoiding tobacco and drug use. Limiting alcohol use. Practicing safe sex. Taking low-dose aspirin every day. Taking vitamin and mineral supplements as recommended by your health care provider. What happens during an annual well check? The services and screenings done by your health care provider during your annual well check will depend on your age, overall health, lifestyle risk factors, and family history of disease. Counseling  Your health care provider may ask you  questions about your: Alcohol use. Tobacco use. Drug use. Emotional well-being. Home and relationship well-being. Sexual activity. Eating habits. History of falls. Memory and ability to understand (cognition). Work and work Astronomer. Reproductive health. Screening  You may have the following tests or measurements: Height, weight, and BMI. Blood pressure. Lipid and cholesterol levels. These may be checked every 5 years, or more frequently if you are over 84 years old. Skin check. Lung cancer screening. You may have this screening every year starting at age 48 if you have a 30-pack-year history of smoking and currently smoke or have quit within the past 15 years. Fecal occult blood test (FOBT) of the stool. You may have this test every year starting at age 19. Flexible sigmoidoscopy or colonoscopy. You may have a sigmoidoscopy every 5 years or a colonoscopy every 10 years starting at age 47. Hepatitis C blood test. Hepatitis B blood test. Sexually transmitted disease (STD) testing. Diabetes screening. This is done by checking your blood sugar (glucose) after you have not eaten for a while (fasting). You may have this done every 1-3 years. Bone density scan. This is done to screen for osteoporosis. You may have this done starting at age 37. Mammogram. This may be done every 1-2 years. Talk to your health care provider about how often you should have regular mammograms. Talk with your health care provider about your test results, treatment options, and if necessary, the need for more tests. Vaccines  Your health care provider may recommend certain vaccines, such as: Influenza vaccine. This is recommended every year. Tetanus, diphtheria, and acellular pertussis (Tdap, Td) vaccine. You may need a Td booster every 10 years. Zoster vaccine. You may need this after age 41. Pneumococcal 13-valent conjugate (PCV13) vaccine. One dose is recommended after  age 60. Pneumococcal polysaccharide  (PPSV23) vaccine. One dose is recommended after age 66. Talk to your health care provider about which screenings and vaccines you need and how often you need them. This information is not intended to replace advice given to you by your health care provider. Make sure you discuss any questions you have with your health care provider. Document Released: 12/20/2015 Document Revised: 08/12/2016 Document Reviewed: 09/24/2015 Elsevier Interactive Patient Education  2017 [place] Prevention in the Home Falls can cause injuries. They can happen to people of all ages. There are many things you can do to make your home safe and to help prevent falls. What can I do on the outside of my home? Regularly fix the edges of walkways and driveways and fix any cracks. Remove anything that might make you trip as you walk through a door, such as a raised step or threshold. Trim any bushes or trees on the path to your home. Use bright outdoor lighting. Clear any walking paths of anything that might make someone trip, such as rocks or tools. Regularly check to see if handrails are loose or broken. Make sure that both sides of any steps have handrails. Any raised decks and porches should have guardrails on the edges. Have any leaves, snow, or ice cleared regularly. Use sand or salt on walking paths during winter. Clean up any spills in your garage right away. This includes oil or grease spills. What can I do in the bathroom? Use night lights. Install grab bars by the toilet and in the tub and shower. Do not use towel bars as grab bars. Use non-skid mats or decals in the tub or shower. If you need to sit down in the shower, use a plastic, non-slip stool. Keep the floor dry. Clean up any water that spills on the floor as soon as it happens. Remove soap buildup in the tub or shower regularly. Attach bath mats securely with double-sided non-slip rug tape. Do not have throw rugs and other things on the  floor that can make you trip. What can I do in the bedroom? Use night lights. Make sure that you have a light by your bed that is easy to reach. Do not use any sheets or blankets that are too big for your bed. They should not hang down onto the floor. Have a firm chair that has side arms. You can use this for support while you get dressed. Do not have throw rugs and other things on the floor that can make you trip. What can I do in the kitchen? Clean up any spills right away. Avoid walking on wet floors. Keep items that you use a lot in easy-to-reach places. If you need to reach something above you, use a strong step stool that has a grab bar. Keep electrical cords out of the way. Do not use floor polish or wax that makes floors slippery. If you must use wax, use non-skid floor wax. Do not have throw rugs and other things on the floor that can make you trip. What can I do with my stairs? Do not leave any items on the stairs. Make sure that there are handrails on both sides of the stairs and use them. Fix handrails that are broken or loose. Make sure that handrails are as long as the stairways. Check any carpeting to make sure that it is firmly attached to the stairs. Fix any carpet that is loose or worn. Avoid having throw rugs  at the top or bottom of the stairs. If you do have throw rugs, attach them to the floor with carpet tape. Make sure that you have a light switch at the top of the stairs and the bottom of the stairs. If you do not have them, ask someone to add them for you. What else can I do to help prevent falls? Wear shoes that: Do not have high heels. Have rubber bottoms. Are comfortable and fit you well. Are closed at the toe. Do not wear sandals. If you use a stepladder: Make sure that it is fully opened. Do not climb a closed stepladder. Make sure that both sides of the stepladder are locked into place. Ask someone to hold it for you, if possible. Clearly mark and make  sure that you can see: Any grab bars or handrails. First and last steps. Where the edge of each step is. Use tools that help you move around (mobility aids) if they are needed. These include: Canes. Walkers. Scooters. Crutches. Turn on the lights when you go into a dark area. Replace any light bulbs as soon as they burn out. Set up your furniture so you have a clear path. Avoid moving your furniture around. If any of your floors are uneven, fix them. If there are any pets around you, be aware of where they are. Review your medicines with your doctor. Some medicines can make you feel dizzy. This can increase your chance of falling. Ask your doctor what other things that you can do to help prevent falls. This information is not intended to replace advice given to you by your health care provider. Make sure you discuss any questions you have with your health care provider. Document Released: 09/19/2009 Document Revised: 04/30/2016 Document Reviewed: 12/28/2014 Elsevier Interactive Patient Education  2017 [employer].

## 2021-07-08 NOTE — Progress Notes (Signed)
I connected with Brandi [name] today by telephone and verified that I am speaking with the correct person using two identifiers. Location patient: home Location provider: work Persons participating in the virtual visit: Mliss FritzCharlene Mccaskill, Greggory Safranek LPN.   I discussed the limitations, risks, security and privacy concerns of performing an evaluation and management service by telephone and the availability of in person appointments. I also discussed with the patient that there may be a patient responsible charge related to this service. The patient expressed understanding and verbally consented to this telephonic visit.    Interactive audio and video telecommunications were attempted between this provider and patient, however failed, due to patient having technical difficulties OR patient did not have access to video capability.  We continued and completed visit with audio only.     Vital signs may be patient reported or missing.  Subjective:   Brandi BarreCharlene Flanigan is a 65 y.o. female who presents for an Initial Medicare Annual Wellness Visit.  Review of Systems      Cardiac Risk Factors include: advanced age (>3355men, 54>65 women);dyslipidemia     Objective:    Today's Vitals   07/08/21 1031  Weight: 122 lb (55.3 kg)  Height: 5\' 4"  (1.626 m)   Body mass index is 20.94 kg/m.  Advanced Directives 07/08/2021 12/16/2020 06/26/2015 04/05/2015  Does Patient Have a Medical Advance Directive? Yes No No No  Type of Estate agentAdvance Directive Healthcare Power of [place];Living will - - -  Copy of Healthcare Power of Attorney in Chart? No - copy requested - - -  Would patient like information on creating a medical advance directive? - No - Patient declined Yes - Educational materials given -    Current Medications (verified) Outpatient Encounter Medications as of 07/08/2021  Medication Sig   albuterol (VENTOLIN HFA) 108 (90 Base) MCG/ACT inhaler Inhale 2 puffs into the lungs every 4 (four) hours as needed for  wheezing or shortness of breath.   alendronate (FOSAMAX) 70 MG tablet Take 1 tablet (70 mg total) by mouth once a week. Take with a full glass of water on an empty stomach.   alum & mag hydroxide-simeth (MAALOX/MYLANTA) 200-200-20 MG/5ML suspension Take 30 mLs by mouth every 4 (four) hours as needed for indigestion or heartburn.   aspirin EC 81 MG tablet Take 1 tablet (81 mg total) by mouth daily.   BREZTRI AEROSPHERE 160-9-4.8 MCG/ACT AERO Inhale 2 puffs into the lungs in the morning and at bedtime.   Budeson-Glycopyrrol-Formoterol (BREZTRI AEROSPHERE) 160-9-4.8 MCG/ACT AERO Inhale 2 puffs into the lungs in the morning and at bedtime.   busPIRone (BUSPAR) 5 MG tablet Take 1 tablet (5 mg total) by mouth 2 (two) times daily as needed (anxiety).   Calcium Carbonate-Vit D-Min (CALCIUM 1200) 1200-1000 MG-UNIT CHEW Chew 1,200 mg by mouth daily.   Cholecalciferol (VITAMIN D3) 125 MCG (5000 UT) TABS Take by mouth. Vitamin D3 5,000 IU daily for 12 weeks then reduce to OTC Vitamin D3 2,000 IU daily for maintenance   ezetimibe (ZETIA) 10 MG tablet Take 1 tablet (10 mg total) by mouth daily.   famotidine (PEPCID) 20 MG tablet TAKE 1 TABLET BY MOUTH TWICE DAILY   Multiple Vitamin (MULTIVITAMIN WITH MINERALS) TABS tablet Take 1 tablet by mouth daily.   OXYGEN Inhale into the lungs at bedtime as needed.   rosuvastatin (CRESTOR) 20 MG tablet Take 1 tablet (20 mg total) by mouth daily.   sodium chloride (OCEAN) 0.65 % SOLN nasal spray Place 1 spray into both nostrils  as needed for congestion.   feeding supplement (ENSURE ENLIVE / ENSURE PLUS) LIQD Take 237 mLs by mouth 2 (two) times daily between meals. (Patient not taking: Reported on 07/08/2021)   No facility-administered encounter medications on file as of 07/08/2021.    Allergies (verified) Patient has no known allergies.   History: Past Medical History:  Diagnosis Date   Hyperlipidemia    Past Surgical History:  Procedure Laterality Date    APPENDECTOMY     CHOLECYSTECTOMY     SHOULDER ARTHROSCOPY Left 07/02/2015   Procedure: ,left shoulder arthroscopy, decompression and debridement;  Surgeon: Christena Flake, MD;  Location: [facility] ORS;  Service: Orthopedics;  Laterality: Left;   SHOULDER CLOSED REDUCTION Left 04/09/2015   Procedure: CLOSED MANIPULATION SHOULDER;  Surgeon: Kennedy Bucker, MD;  Location: [facility] OR[facility];  Service: Orthopedics;  Laterality: Left;   Family History  Problem Relation Age of Onset   Breast cancer Mother 33   Alzheimer's disease Mother    Social History   Socioeconomic History   Marital status: Married    Spouse name: Not on file   Number of children: Not on file   Years of education: Not on file   Highest education level: Not on file  Occupational History   Not on file  Tobacco Use   Smoking status: Former    Packs/day: 0.75    Years: 41.00    Pack years: 30.75    Types: E-cigarettes, Cigarettes    Quit date: 12/08/2015    Years since quitting: 5.5   Smokeless tobacco: Former  Building services engineer Use: Never used  Substance and Sexual Activity   Alcohol use: No   Drug use: No   Sexual activity: Yes    Birth control/protection: Post-menopausal  Other Topics Concern   Not on file  Social History Narrative   Not on file   Social Determinants of Health   Financial Resource Strain: Medium Risk   Difficulty of Paying Living Expenses: Somewhat hard  Food Insecurity: No Food Insecurity   Worried About Programme researcher, broadcasting/film/video in the Last Year: Never true   Ran Out of Food in the Last Year: Never true  Transportation Needs: No Transportation Needs   Lack of Transportation (Medical): No   Lack of Transportation (Non-Medical): No  Physical Activity: Inactive   Days of Exercise per Week: 0 days   Minutes of Exercise per Session: 0 min  Stress: No Stress Concern Present   Feeling of Stress : Only a little  Social Connections: Not on file    Tobacco Counseling Counseling given: Not  Answered   Clinical Intake:  Pre-visit preparation completed: Yes  Pain : No/denies pain     Nutritional Status: BMI of 19-24  Normal Nutritional Risks: None Diabetes: No  How often do you need to have someone help you when you read instructions, pamphlets, or other written materials from your doctor or pharmacy?: 1 - Never What is the last grade level you completed in school?: 12th grade  Diabetic? no  Interpreter Needed?: No  Information entered by :: NAllen LPN   Activities of Daily Living In your present state of health, do you have any difficulty performing the following activities: 07/08/2021 12/16/2020  Hearing? N N  Vision? N N  Difficulty concentrating or making decisions? N N  Walking or climbing stairs? N N  Dressing or bathing? N N  Doing errands, shopping? N N  Preparing Food and eating ? N -  Using the  Toilet? N -  In the past six months, have you accidently leaked urine? N -  Do you have problems with loss of bowel control? N -  Managing your Medications? N -  Managing your Finances? N -  Housekeeping or managing your Housekeeping? N -  Some recent data might be hidden    Patient Care Team: [name], DO as PCP - General (Family Medicine) [place], Jackelyn Poling, RPH-CPP (Pharmacist) Bridgett Larsson, LCSW as Social Worker (Licensed Clinical Social Worker)  Indicate any recent [employer] you may have received from other than [facility] providers in the past year (date may be approximate).     Assessment:   This is a routine wellness examination for [place].  Hearing/Vision screen Vision Screening - Comments:: No regular eye exams, [facility]  Dietary issues and exercise activities discussed: Current Exercise Habits: The patient has a physically strenuous job, but has no regular exercise apart from work.   Goals Addressed             This Visit's Progress    Patient Stated       07/08/2021, no goals       Depression  Screen PHQ 2/9 Scores 07/08/2021 05/06/2021 02/20/2021 04/08/2020  PHQ - 2 Score 0 0 2 0  PHQ- 9 Score - 0 5 -    Fall Risk Fall Risk  07/08/2021 05/06/2021 05/06/2021 04/08/2020  Falls in the past year? 0 0 0 0  Number falls in past yr: - 0 0 0  Injury with Fall? - 0 0 0  Risk for fall due to : Medication side effect - - -  Follow up Falls evaluation completed;Education provided;Falls prevention discussed Falls evaluation completed Falls evaluation completed Falls evaluation completed    FALL RISK PREVENTION PERTAINING TO THE HOME:  Any stairs in or around the home? Yes  If so, are there any without handrails? No  Home free of loose throw rugs in walkways, pet beds, electrical cords, etc? Yes  Adequate lighting in your home to reduce risk of falls? Yes   ASSISTIVE DEVICES UTILIZED TO PREVENT FALLS:  Life alert? No  Use of a cane, walker or w/c? No  Grab bars in the bathroom? Yes  Shower chair or bench in shower? Yes  Elevated toilet seat or a handicapped toilet? Yes   TIMED UP AND GO:  Was the test performed? No .      Cognitive Function:     6CIT Screen 07/08/2021  What Year? 0 points  What month? 0 points  What time? 0 points  Count back from 20 2 points  Months in reverse 2 points  Repeat phrase 0 points  Total Score 4    Immunizations Immunization History  Administered Date(s) Administered   Moderna Sars-Covid-2 Vaccination 05/26/2021, 06/24/2021    TDAP status: Due, Education has been provided regarding the importance of this vaccine. Advised may receive this vaccine at local pharmacy or Health Dept. Aware to provide a copy of the vaccination record if obtained from local pharmacy or Health Dept. Verbalized acceptance and understanding.  Flu Vaccine status: Declined, Education has been provided regarding the importance of this vaccine but patient still declined. Advised may receive this vaccine at local pharmacy or Health Dept. Aware to provide a copy of the  vaccination record if obtained from local pharmacy or Health Dept. Verbalized acceptance and understanding.  Pneumococcal vaccine status: Declined,  Education has been provided regarding the importance of this vaccine but patient  still declined. Advised may receive this vaccine at local pharmacy or Health Dept. Aware to provide a copy of the vaccination record if obtained from local pharmacy or Health Dept. Verbalized acceptance and understanding.   Covid-19 vaccine status: Completed vaccines  Qualifies for Shingles Vaccine? Yes   Zostavax completed No   Shingrix Completed?: No.    Education has been provided regarding the importance of this vaccine. Patient has been advised to call insurance company to determine out of pocket expense if they have not yet received this vaccine. Advised may also receive vaccine at local pharmacy or Health Dept. Verbalized acceptance and understanding.  Screening Tests Health Maintenance  Topic Date Due   Zoster Vaccines- Shingrix (1 of 2) Never done   MAMMOGRAM  01/04/2021   INFLUENZA VACCINE  08/24/2021 (Originally 07/07/2021)   COLONOSCOPY (Pts 45-36yrs Insurance coverage will need to be confirmed)  11/04/2021 (Originally 10/06/2000)   TETANUS/TDAP  07/08/2022 (Originally 10/06/1974)   PNA vac Low Risk Adult (1 of 2 - PCV13) 07/08/2022 (Originally 10/06/2020)   PAP SMEAR-Modifier  11/10/2021   COVID-19 Vaccine (3 - Booster for Moderna series) 11/24/2021   DEXA SCAN  Completed   Hepatitis C Screening  Completed   HIV Screening  Completed   HPV VACCINES  Aged Out    Health Maintenance  Health Maintenance Due  Topic Date Due   Zoster Vaccines- Shingrix (1 of 2) Never done   MAMMOGRAM  01/04/2021    Colorectal cancer screening: Type of screening: Cologuard. Completed 02/06/2021. Repeat every 3 years  Mammogram status: patient to schedule  Bone Density status: Completed 01/04/2019.   Lung Cancer Screening: (Low Dose CT Chest recommended if Age 19-80  years, 30 pack-year currently smoking OR have quit w/in 15years.) does qualify.   Lung Cancer Screening Referral: CT scan 04/09/2021  Additional Screening:  Hepatitis C Screening: does qualify; Completed 01/20/2021  Vision Screening: Recommended annual ophthalmology exams for early detection of glaucoma and other disorders of the eye. Is the patient up to date with their annual eye exam?  No  Who is the provider or what is the name of the office in which the patient attends annual eye exams? [facility] If pt is not established with a provider, would they like to be referred to a provider to establish care? No .   Dental Screening: Recommended annual dental exams for proper oral hygiene  Community Resource Referral / Chronic Care Management: CRR required this visit?  No   CCM required this visit?  No      Plan:     I have personally reviewed and noted the following in the patient's chart:   Medical and social history Use of alcohol, tobacco or illicit drugs  Current medications and supplements including opioid prescriptions. Patient is not currently taking opioid prescriptions. Functional ability and status Nutritional status Physical activity Advanced directives List of other physicians Hospitalizations, surgeries, and ER visits in previous 12 months Vitals Screenings to include cognitive, depression, and falls Referrals and appointments  In addition, I have reviewed and discussed with patient certain preventive protocols, quality metrics, and best practice recommendations. A written personalized care plan for preventive services as well as general preventive health recommendations were provided to patient.     Barb Merino, LPN   08/12/7892   Nurse Notes:

## 2021-07-09 DIAGNOSIS — G4730 Sleep apnea, unspecified: Secondary | ICD-10-CM | POA: Diagnosis not present

## 2021-07-09 DIAGNOSIS — R0683 Snoring: Secondary | ICD-10-CM | POA: Diagnosis not present

## 2021-07-09 LAB — MISC LABCORP TEST (SEND OUT): Labcorp test code: 95653

## 2021-07-10 ENCOUNTER — Telehealth: Payer: Self-pay

## 2021-07-10 ENCOUNTER — Telehealth: Payer: Self-pay | Admitting: Pulmonary Disease

## 2021-07-10 ENCOUNTER — Ambulatory Visit: Payer: Self-pay | Admitting: *Deleted

## 2021-07-10 DIAGNOSIS — Z91038 Other insect allergy status: Secondary | ICD-10-CM

## 2021-07-10 NOTE — Telephone Encounter (Signed)
Pt called complaining of ant bites, her hands are swollen and she is in a lot of pain. Discussed with triage to place clinical call    Reason for Disposition  Fire ant sting(s) with normal local reaction  Answer Assessment - Initial Assessment Questions 1. SEVERITY: "How many stings are there?"     2-3 2. ONSET: "When did it occur?"      Yesterday evening 3. LOCATION: "Where is the sting located?"  "How many stings?"     Right hand 4. SWELLING: "How big is the swelling?" (e.g., inches or cm)     Mild  "Bigger than my left hand"  Thumb swollen most 5. REDNESS: "Is the area red or pink?" If Yes, ask: "What size is the area of redness?" (e.g., inches or cm). "When did the redness start?"     Red splotchy 6. PAIN: "Is there any pain?" If Yes, ask: "How bad is it?"  (Scale 1-10; or mild, moderate, severe)     7/10 7. ITCHING: "Is there any itching?" If Yes, ask: "How bad is it?"      yes 8. RESPIRATORY DISTRESS: "Describe your breathing."     no 9. PRIOR REACTIONS: "Have you had any severe allergic reactions to stings in the past?" If yes, ask: "What happened?"     no 10. OTHER SYMPTOMS: "Do you have any other symptoms?" (e.g., abdominal pain, face or tongue swelling, new rash elsewhere, vomiting)       no  Protocols used: [employer]

## 2021-07-10 NOTE — Telephone Encounter (Signed)
Pt reports stung by fire ants yesterday evening. States 2-3 bites on right hand. Reports "Splotchy red", painful 6/10 and itchy.  Mild swelling "Well it's some bigger than my left hand." Denies any throat, tongue swelling , no SOB. Home care advised given per protocol.  Assured pt NT would route to practice for PCPs review.  Pt verbalizes understanding.

## 2021-07-10 NOTE — Telephone Encounter (Signed)
Alpha-1 was normal.     Patient is aware of results and voiced her understanding. Nothing further needed.

## 2021-07-10 NOTE — Telephone Encounter (Signed)
Copied from CRM [id number]. Topic: General - Inquiry >> Jul 10, 2021  3:02 PM Daphine Deutscher D wrote: Reason for CRM: Pt called saying she has gotten ant bites on her hands.  She said Dr. Kirtland Bouchard hs given her something before for the reaction and she wants to know if he an do the same now.  She uses [facility]  CB#  [phone]

## 2021-07-11 MED ORDER — TRIAMCINOLONE ACETONIDE 0.5 % EX CREA: 1.0000 "application " | TOPICAL_CREAM | Freq: Two times a day (BID) | CUTANEOUS | 0 refills | Status: DC

## 2021-07-11 NOTE — Telephone Encounter (Signed)
Sent Triamcinolone cream to pharmacy.  [name], DO [facility] 07/11/2021, 8:10 AM

## 2021-07-16 ENCOUNTER — Other Ambulatory Visit: Payer: Medicare HMO

## 2021-07-22 ENCOUNTER — Ambulatory Visit (INDEPENDENT_AMBULATORY_CARE_PROVIDER_SITE_OTHER): Payer: Medicare HMO | Admitting: Licensed Clinical Social Worker

## 2021-07-22 DIAGNOSIS — E559 Vitamin D deficiency, unspecified: Secondary | ICD-10-CM

## 2021-07-22 DIAGNOSIS — R7303 Prediabetes: Secondary | ICD-10-CM

## 2021-07-22 DIAGNOSIS — F4323 Adjustment disorder with mixed anxiety and depressed mood: Secondary | ICD-10-CM

## 2021-07-23 ENCOUNTER — Ambulatory Visit: Payer: Medicare HMO | Admitting: Pharmacist

## 2021-07-23 DIAGNOSIS — E782 Mixed hyperlipidemia: Secondary | ICD-10-CM

## 2021-07-23 DIAGNOSIS — J432 Centrilobular emphysema: Secondary | ICD-10-CM

## 2021-07-23 DIAGNOSIS — E559 Vitamin D deficiency, unspecified: Secondary | ICD-10-CM

## 2021-07-23 DIAGNOSIS — J9601 Acute respiratory failure with hypoxia: Secondary | ICD-10-CM | POA: Diagnosis not present

## 2021-07-23 DIAGNOSIS — U071 COVID-19: Secondary | ICD-10-CM | POA: Diagnosis not present

## 2021-07-23 NOTE — Chronic Care Management (AMB) (Signed)
Chronic Care Management Pharmacy Note  07/23/2021 Name:  Brandi [name] MRN:  [id number] DOB:  09-09-1956   Subjective: Brandi [name] is an 65 y.o. year old female who is a primary patient of Olin Hauser, DO.  The CCM team was consulted for assistance with disease management and care coordination needs.    Engaged with patient by telephone for follow up visit in response to provider referral for pharmacy case management and/or care coordination services.   Consent to Services:  The patient was given information about Chronic Care Management services, agreed to services, and gave verbal consent prior to initiation of services.  Please see initial visit note for detailed documentation.   Patient Care Team: Olin Hauser, DO as PCP - General (Family Medicine) Curley Spice, Virl Diamond, RPH-CPP (Pharmacist) Rebekah Chesterfield, LCSW as Social Worker (Licensed Clinical Social Worker)  Recent office visits: None  Hospital visits: None in previous 6 months  Objective:  Lab Results  Component Value Date   CREATININE 0.58 01/20/2021   CREATININE 0.51 12/23/2020   CREATININE 0.44 12/22/2020        Component Value Date/Time   CHOL 188 01/20/2021 0801   CHOL 145 04/01/2020 0848   TRIG 73 01/20/2021 0801   HDL 61 01/20/2021 0801   HDL 55 04/01/2020 0848   CHOLHDL 3.1 01/20/2021 0801   LDLCALC 111 (H) 01/20/2021 0801    Hepatic Function Latest Ref Rng & Units 01/20/2021 12/22/2020 12/21/2020  Total Protein 6.1 - 8.1 g/dL 6.1 6.2(L) 5.3(L)  Albumin 3.5 - 5.0 g/dL - 3.4(L) 3.0(L)  AST 10 - 35 U/L '17 20 19  ' ALT 6 - 29 U/L '15 25 22  ' Alk Phosphatase 38 - 126 U/L - 39 36(L)  Total Bilirubin 0.2 - 1.2 mg/dL 0.4 0.7 0.5  Bilirubin, Direct 0.00 - 0.40 mg/dL - - -    Lab Results  Component Value Date/Time   VD25OH 22 (L) 01/20/2021 08:01 AM   VD25OH 21.4 (L) 01/31/2019 03:13 PM    Social History   Tobacco Use  Smoking Status Former   Packs/day: 0.75   Years:  41.00   Pack years: 30.75   Types: E-cigarettes, Cigarettes   Quit date: 12/08/2015   Years since quitting: 5.6  Smokeless Tobacco Former   BP Readings from Last 3 Encounters:  06/25/21 100/70  05/06/21 110/67  04/15/21 106/70   Pulse Readings from Last 3 Encounters:  06/25/21 96  05/06/21 87  04/15/21 93   Wt Readings from Last 3 Encounters:  07/08/21 122 lb (55.3 kg)  06/25/21 123 lb 3.2 oz (55.9 kg)  05/06/21 122 lb (55.3 kg)    Assessment: Review of patient past medical history, allergies, medications, health status, including review of consultants reports, laboratory and other test data, was performed as part of comprehensive evaluation and provision of chronic care management services.   SDOH:  (Social Determinants of Health) assessments and interventions performed: none   CCM Care Plan  No Known Allergies  Medications Reviewed Today     Reviewed by Rennis Petty, RPH-CPP (Pharmacist) on 07/23/21 at 1029  Med List Status: <None>   Medication Order Taking? Sig Documenting Provider Last Dose Status Informant  albuterol (VENTOLIN HFA) 108 (90 Base) MCG/ACT inhaler [id number]  Inhale 2 puffs into the lungs every 4 (four) hours as needed for wheezing or shortness of breath. Olin Hauser, DO  Active   alendronate (FOSAMAX) 70 MG tablet [id number] Yes Take 1 tablet (70 mg total) by  mouth once a week. Take with a full glass of water on an empty stomach. Homero Fellers, MD Taking Active            Med Note Curley Spice, Nicholous Girgenti A   Wed Jul 02, 2021  1:19 PM)    alum & mag hydroxide-simeth (MAALOX/MYLANTA) 200-200-20 MG/5ML suspension [id number]  Take 30 mLs by mouth every 4 (four) hours as needed for indigestion or heartburn. Nicole Kindred A, DO  Active   aspirin EC 81 MG tablet [id number]  Take 1 tablet (81 mg total) by mouth daily. Minna Merritts, MD  Active Self  BREZTRI AEROSPHERE 160-9-4.8 MCG/ACT Hollie Salk [id number]  Inhale 2 puffs into the lungs in the  morning and at bedtime. Olin Hauser, DO  Active   Budeson-Glycopyrrol-Formoterol (BREZTRI AEROSPHERE) 160-9-4.8 MCG/ACT Hollie Salk [id number]  Inhale 2 puffs into the lungs in the morning and at bedtime. Tyler Pita, MD  Active   busPIRone (BUSPAR) 5 MG tablet [id number] Yes Take 1 tablet (5 mg total) by mouth 2 (two) times daily as needed (anxiety). Olin Hauser, DO Taking Active   Calcium Carbonate-Vit D-Min (CALCIUM 1200) 1200-1000 MG-UNIT CHEW [id number] Yes Chew 1,200 mg by mouth daily. Homero Fellers, MD Taking Active   Cholecalciferol (VITAMIN D3) 125 MCG (5000 UT) TABS [id number] Yes Take by mouth. Vitamin D3 5,000 IU daily for 12 weeks then reduce to OTC Vitamin D3 2,000 IU daily for maintenance [provider] Taking Active   ezetimibe (ZETIA) 10 MG tablet [id number] Yes Take 1 tablet (10 mg total) by mouth daily. Minna Merritts, MD Taking Active   famotidine (PEPCID) 20 MG tablet [id number]  TAKE 1 TABLET BY MOUTH TWICE DAILY [name], Devonne Doughty, DO  Active   feeding supplement (ENSURE ENLIVE / ENSURE PLUS) LIQD [id number]  Take 237 mLs by mouth 2 (two) times daily between meals.  Patient not taking: Reported on 07/08/2021   Nicole Kindred A, DO  Active   Multiple Vitamin (MULTIVITAMIN WITH MINERALS) TABS tablet [id number]  Take 1 tablet by mouth daily. Ezekiel Slocumb, DO  Active   OXYGEN [id number]  Inhale into the lungs at bedtime as needed. [provider]  Active   rosuvastatin (CRESTOR) 20 MG tablet [id number] Yes Take 1 tablet (20 mg total) by mouth daily. Minna Merritts, MD Taking Active   sodium chloride (OCEAN) 0.65 % SOLN nasal spray [id number]  Place 1 spray into both nostrils as needed for congestion. [provider]  Active   Specialty Vitamins Products (HAIR NOURISHING SUPPLEMENT PO) [id number] Yes Take 1 tablet by mouth daily. [provider]  Active   triamcinolone cream (KENALOG) 0.5 % [id number] Yes Apply  1 application topically 2 (two) times daily. To affected areas, for up to 2 weeks. Olin Hauser, DO Taking Active             Patient Active Problem List   Diagnosis Date Noted   Pre-diabetes 04/08/2021   Vitamin D deficiency 04/08/2021   COVID-19 vaccination declined 12/04/2020   Centrilobular emphysema ([place]) 04/08/2020   Nodule of upper lobe of left lung 04/08/2020   Aortic atherosclerosis ([place]) 05/21/2019   Coronary artery calcification seen on CT scan 05/21/2019   Former smoker 05/21/2019   Hyperlipidemia 05/21/2019    Immunization History  Administered Date(s) Administered   Moderna Sars-Covid-2 Vaccination 05/26/2021, 06/24/2021    Conditions to be addressed/monitored: COPD, Vitamin D deficiency, HLD  Care Plan : PharmD - Medication Assistance/Adherence  Updates made  by Rennis Petty, RPH-CPP since 07/23/2021 12:00 AM     Problem: Disease Progression      Long-Range Goal: Disease Progression Prevented or Minimized   Start Date: 04/30/2021  Expected End Date: 07/29/2021  This Visit's Progress: On track  Recent Progress: On track  Priority: High  Note:   Current Barriers:  Unable to independently afford treatment regimen Reports cost of Breztri inhaler unaffordable through [place] Patient enrolled in [facility] 2022 COPD Inhaler Support Program  Pharmacist Clinical Goal(s):  Over the next 90 days, patient will verbalize ability to afford treatment regimen through collaboration with PharmD and provider.   Interventions: 1:1 collaboration with Olin Hauser, DO regarding development and update of comprehensive plan of care as evidenced by provider attestation and co-signature Inter-disciplinary care team collaboration (see longitudinal plan of care) Perform chart review. Note patient missed appointment for Echocardiogram scheduled for 8/10 Today patient reports missed echocardiogram appointment due to death in  family Encourage patient to call Cardiology today to reschedule. Patient confirms having phone number Reports has recently started taking a hair and nails supplement as noticed increased hair loss since had COVID-19 infection  COPD: Current treatment: Breztri inhaler - 2 puffs in morning and at bedtime Albuterol inhaler - 1 puffs every 4 hours as needed Confirms picked up refill of albuterol Confirms using Breztri inhaler twice daily (and rinse out mouth after use) and use albuterol inhaler as needed as directed Confirms has sufficient supply of Breztri from [employer] Reports breathing has also improved with recent decrease in humidity  Vitamin D deficiency: Current treatment: OTC Vitamin D3 5,000 iu daily (Started ~5/25) Counsel patient may now reduce to OTC Vitamin D3 2,000 iu daily for maintenance as has completed 12 weeks of higher dose Patient verbalizes understanding and states will obtain and start lower dose strength once used up current supply  Osteoporosis: Last DEXA: 01/04/2019, BMD measured at Femur Total Right is 0.630 g/cm2 with a T-score of -3.0 Current treatment: alendronate 70 mg weekly on Wednesdays  Confirms taking first thing in the morning and ?30 minutes before the first food, beverage (except plain water), or other medication of the day and to stay upright (not to lie down) for ?30 minutes and until after first food of the day (to reduce esophageal irritation).  Medication Adherence: Confirms started using weekly pillbox as adherence aid   Patient Goals/Self-Care Activities Over the next 90 days, patient will:  - take medications as prescribed - collaborate with provider on medication access solutions - attend medical appointments as scheduled Next appointment with PCP on 9/6 Next appointment with Pulmonology on 9/12  Follow Up Plan: Telephone follow up appointment with care management team member scheduled for: 10/01/2021 at 11:15 AM        Patient's preferred pharmacy is:  [facility] [address] [place] [address] Phone: [phone] Fax: [phone]   Follow Up:  Patient agrees to Care Plan and Follow-up.  Wallace Cullens, PharmD, Para March, CPP Clinical Pharmacist [facility] [phone]

## 2021-07-23 NOTE — Patient Instructions (Signed)
Visit Information  PATIENT GOALS:  Goals Addressed             This Visit's Progress    Pharmacy Goals       Our goal bad cholesterol, or LDL, is less than 70. This is why it is important to continue taking your rosuvastatin and ezetimibe.  Feel free to call me with any questions or concerns. I look forward to our next call!  Estelle Grumbles, PharmD, Patsy [place], CPP Clinical Pharmacist [facility] [phone]        The patient verbalized understanding of instructions, educational materials, and care plan provided today and declined offer to receive copy of patient instructions, educational materials, and care plan.   Telephone follow up appointment with care management team member scheduled for: 10/01/2021 at 11:15 AM

## 2021-07-24 ENCOUNTER — Telehealth: Payer: Self-pay | Admitting: Pulmonary Disease

## 2021-07-24 NOTE — Telephone Encounter (Signed)
Rec'd fax from [employer] with results of 07/09/2021 ONO.  Emailed document to [employer] in [place] office.
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
## 2021-07-25 NOTE — Patient Instructions (Signed)
Visit Information   Goals Addressed             This Visit's Progress    Management of grief symptoms   On track    Patient Goals/Self-Care Activities: Over the next 120 days Continue compliance with medication management Attend all scheduled appointments with providers Contact office with any questions or concerns Avoid negative self-talk Develop a plan to deal with triggers like holidays, anniversaries Spend time or talk with others every day Practice positive thinking and self-talk        Patient verbalizes understanding of instructions provided today.   Telephone follow up appointment with care management team member scheduled for:09/30/21  Jenel Lucks, MSW, LCSW [facility] Management [facility]  [facility] [email or web address] Phone [phone] 11:46 PM

## 2021-07-30 ENCOUNTER — Telehealth: Payer: Self-pay

## 2021-07-30 NOTE — Telephone Encounter (Signed)
Called and spoke to patient about test results, Per Dr Jayme Cloud patient qualifies for 2L of O2 at night.  Patient was not in agreement to 2L at night until she sis able to see the ONO results her self. Will mail her a copy of results.

## 2021-07-31 ENCOUNTER — Other Ambulatory Visit: Payer: Self-pay | Admitting: Family Medicine

## 2021-07-31 DIAGNOSIS — K219 Gastro-esophageal reflux disease without esophagitis: Secondary | ICD-10-CM

## 2021-07-31 NOTE — Telephone Encounter (Signed)
Patient will call with decision once she has reviewed ONO per Jinger Neighbors, CMA verbally.  Will close encounter at this time.

## 2021-08-02 DIAGNOSIS — U071 COVID-19: Secondary | ICD-10-CM | POA: Diagnosis not present

## 2021-08-02 DIAGNOSIS — J9601 Acute respiratory failure with hypoxia: Secondary | ICD-10-CM | POA: Diagnosis not present

## 2021-08-06 DIAGNOSIS — E782 Mixed hyperlipidemia: Secondary | ICD-10-CM | POA: Diagnosis not present

## 2021-08-06 DIAGNOSIS — J432 Centrilobular emphysema: Secondary | ICD-10-CM | POA: Diagnosis not present

## 2021-08-06 DIAGNOSIS — F4323 Adjustment disorder with mixed anxiety and depressed mood: Secondary | ICD-10-CM | POA: Diagnosis not present

## 2021-08-08 ENCOUNTER — Telehealth: Payer: Self-pay

## 2021-08-08 NOTE — Telephone Encounter (Signed)
Called and spoke to patient about upcoming COVID test, nothing further is needed.

## 2021-08-12 ENCOUNTER — Other Ambulatory Visit: Payer: Self-pay

## 2021-08-12 ENCOUNTER — Ambulatory Visit (INDEPENDENT_AMBULATORY_CARE_PROVIDER_SITE_OTHER): Payer: Medicare HMO | Admitting: Family Medicine

## 2021-08-12 ENCOUNTER — Encounter: Payer: Self-pay | Admitting: Family Medicine

## 2021-08-12 VITALS — BP 109/71 | HR 79 | Ht 64.0 in | Wt 122.8 lb

## 2021-08-12 DIAGNOSIS — I700 Atherosclerosis of aorta: Secondary | ICD-10-CM | POA: Diagnosis not present

## 2021-08-12 DIAGNOSIS — E782 Mixed hyperlipidemia: Secondary | ICD-10-CM

## 2021-08-12 DIAGNOSIS — F4323 Adjustment disorder with mixed anxiety and depressed mood: Secondary | ICD-10-CM | POA: Diagnosis not present

## 2021-08-12 DIAGNOSIS — E559 Vitamin D deficiency, unspecified: Secondary | ICD-10-CM | POA: Diagnosis not present

## 2021-08-12 DIAGNOSIS — J432 Centrilobular emphysema: Secondary | ICD-10-CM

## 2021-08-12 MED ORDER — BUSPIRONE HCL 5 MG PO TABS: 5.0000 mg | ORAL_TABLET | Freq: Two times a day (BID) | ORAL | 2 refills | Status: DC | PRN

## 2021-08-12 NOTE — Assessment & Plan Note (Addendum)
Identified on CT imaging 04/2021 Was on Statin therapy previously but has myalgia and symptoms may come off now

## 2021-08-12 NOTE — Patient Instructions (Addendum)
Thank you for coming to the office today.  If you are having muscle symptoms aches and cramps, you can try pausing the Rosuvastatin 20mg  for 2 weeks to see if your symptoms go away.  If you improve off of this, then you will likely need a lower dose or spacing the dose out, you can reduce it to half pill or every other day or about 2 times a week.  Breztri sample if we have it.   Please schedule a Follow-up Appointment to: Return in about 6 months (around 02/09/2022) for 6 month follow-up COPD, Hyperlipidemia.  If you have any other questions or concerns, please feel free to call the office or send a message through MyChart. You may also schedule an earlier appointment if necessary.  Additionally, you may be receiving a survey about your experience at our office within a few days to 1 week by e-mail or mail. We value your feedback.  04/11/2022, DO [facility], [facility]

## 2021-08-12 NOTE — Progress Notes (Signed)
Subjective:    Patient ID: Brandi [name], female    DOB: Apr 17, 1956, 65 y.o.   MRN: [id number]  Brandi [name] is a 65 y.o. female presenting on 08/12/2021 for COPD and Numbness   HPI  Centrilobular Emphysema History of covid Followed by Dr Kearney Hard Pulmonology  She has had Overnight Pulse Oximetry showed some low oxygen overnight by her report she declines oxygen however at this time. Taking Breztri inhaler for maintenance with improvement Has Albuterol PRN LDCT  Vitamin D3 Deficiency She will take Vitamin D3 5,000 every other day now since she has completed her 12 week therapy.  Hyperlipidemia Followed by Dr Mariah Milling Cardiology On Zetia 10mg  and Rosuvastatin 20mg   Admits since starting Statin has had muscle ache and cramps.   Depression screen [facility] 2/9 08/12/2021 07/08/2021 05/06/2021  Decreased Interest 0 0 0  Down, Depressed, Hopeless 0 0 0  PHQ - 2 Score 0 0 0  Altered sleeping 0 - 0  Tired, decreased energy 0 - 0  Change in appetite 0 - 0  Feeling bad or failure about yourself  0 - 0  Trouble concentrating 0 - 0  Moving slowly or fidgety/restless 0 - 0  Suicidal thoughts 0 - 0  PHQ-9 Score 0 - 0  Difficult doing work/chores Not difficult at all - Not difficult at all    Social History   Tobacco Use   Smoking status: Former    Packs/day: 0.75    Years: 41.00    Pack years: 30.75    Types: E-cigarettes, Cigarettes    Quit date: 12/08/2015    Years since quitting: 5.6   Smokeless tobacco: Former  05/08/2021 Use: Never used  Substance Use Topics   Alcohol use: No   Drug use: No    Review of Systems Per HPI unless specifically indicated above     Objective:    BP 109/71   Pulse 79   Ht 5\' 4"  (1.626 m)   Wt 122 lb 12.8 oz (55.7 kg)   SpO2 99%   BMI 21.08 kg/m   Wt Readings from Last 3 Encounters:  08/12/21 122 lb 12.8 oz (55.7 kg)  07/08/21 122 lb (55.3 kg)  06/25/21 123 lb 3.2 oz (55.9 kg)    Physical Exam Vitals and nursing note  reviewed.  Constitutional:      General: She is not in acute distress.    Appearance: Normal appearance. She is well-developed. She is not diaphoretic.     Comments: Well-appearing, comfortable, cooperative  HENT:     Head: Normocephalic and atraumatic.  Eyes:     General:        Right eye: No discharge.        Left eye: No discharge.     Conjunctiva/sclera: Conjunctivae normal.  Cardiovascular:     Rate and Rhythm: Normal rate.  Pulmonary:     Effort: Pulmonary effort is normal.     Comments: Slight reduced air movement overall diffusely stable at baseline. No inc work of breathing. Otherwise clear lungs. Skin:    General: Skin is warm and dry.     Findings: No erythema or rash.  Neurological:     Mental Status: She is alert and oriented to person, place, and time.  Psychiatric:        Mood and Affect: Mood normal.        Behavior: Behavior normal.        Thought Content: Thought content normal.  Comments: Well groomed, good eye contact, normal speech and thoughts    I have personally reviewed the radiology report from 04/09/21.  CLINICAL DATA:  Lung cancer screening. 30.75 pack-year history. Former asymptomatic smoker.   EXAM: CT CHEST WITHOUT CONTRAST LOW-DOSE FOR LUNG CANCER SCREENING   TECHNIQUE: Multidetector CT imaging of the chest was performed following the standard protocol without IV contrast.   COMPARISON:  04/01/2020   FINDINGS: Cardiovascular: The heart size is within normal limits. Aortic atherosclerosis. Coronary artery calcifications.   Mediastinum/Nodes: No enlarged mediastinal, hilar, or axillary lymph nodes. Thyroid gland, trachea, and esophagus demonstrate no significant findings.   Lungs/Pleura: No pleural effusion. Moderate paraseptal and centrilobular emphysema with diffuse bronchial wall thickening. No airspace consolidation, atelectasis, or pneumothorax. Scar like densities noted within the right lower lobe. Previous left upper lobe  lung nodule measures 2 mm on today's exam. Previously 3.9 mm. No new lung nodules.   Upper Abdomen: No acute abnormality. Previous cholecystectomy. No bile duct dilatation identified.   Musculoskeletal: Mild osteopenia. No acute or suspicious osseous abnormalities. Superior endplate compression deformities identified at T7 and T9. Unchanged.   IMPRESSION: 1. Lung-RADS 2, benign appearance or behavior. Continue annual screening with low-dose chest CT without contrast in 12 months. 2. Diffuse bronchial wall thickening with emphysema, as above; imaging findings suggestive of underlying COPD. 3. Coronary artery calcifications.   Aortic Atherosclerosis (ICD10-I70.0) and Emphysema (ICD10-J43.9).     Electronically Signed   By: Signa Kell M.D.   On: 04/10/2021 12:08  Results for orders placed or performed during the hospital encounter of 07/04/21  Miscellaneous [facility] test (send-out)  Result Value Ref Range   [facility] test code [id number]    [facility] test name antitrypsin phenotyping    Misc [facility] result COMMENT       Assessment & Plan:   Problem List Items Addressed This Visit     Vitamin D deficiency   Hyperlipidemia   Centrilobular emphysema (HCC) - Primary   Aortic atherosclerosis (HCC)    Identified on CT imaging 04/2021 Was on Statin therapy previously but has myalgia and symptoms may come off now      Other Visit Diagnoses     Acute adjustment disorder with mixed anxiety and depressed mood       Relevant Medications   busPIRone (BUSPAR) 5 MG tablet       Re order Buspar for anxiety PRN  Emphysema / COPD Followed by Pulmonology Dr Jayme Cloud Continue on prescribed Breztri have sample inhaler for her today just in case as she enters medicare donut hole  May have some statin induced myopathy symptoms She can trial holding statin for 2 weeks to see if symptoms resolve if they do then she may need lower dose, switch dose or intermittent dosing, can notify us or  cardiology Continue zetia  Vitamin D can take 5k every other day since she has pill bottle for now.   Meds ordered this encounter  Medications   busPIRone (BUSPAR) 5 MG tablet    Sig: Take 1 tablet (5 mg total) by mouth 2 (two) times daily as needed (anxiety).    Dispense:  60 tablet    Refill:  2    Add refills on file if need      Follow up plan: Return in about 6 months (around 02/09/2022) for 6 month follow-up COPD, Hyperlipidemia.   [name], DO [facility] 08/12/2021, 11:18 AM

## 2021-08-13 ENCOUNTER — Other Ambulatory Visit
Admission: RE | Admit: 2021-08-13 | Discharge: 2021-08-13 | Disposition: A | Discharge status: Home / Self Care | Payer: Medicare HMO | Source: Ambulatory Visit | Attending: Pulmonary Disease | Admitting: Pulmonary Disease

## 2021-08-13 DIAGNOSIS — Z20822 Contact with and (suspected) exposure to covid-19: Secondary | ICD-10-CM | POA: Insufficient documentation

## 2021-08-13 DIAGNOSIS — Z01812 Encounter for preprocedural laboratory examination: Secondary | ICD-10-CM | POA: Diagnosis not present

## 2021-08-13 LAB — SARS CORONAVIRUS 2 (TAT 6-24 HRS): SARS Coronavirus 2: NEGATIVE

## 2021-08-14 ENCOUNTER — Other Ambulatory Visit: Payer: Self-pay

## 2021-08-14 ENCOUNTER — Ambulatory Visit (INDEPENDENT_AMBULATORY_CARE_PROVIDER_SITE_OTHER): Payer: Medicare HMO

## 2021-08-14 ENCOUNTER — Ambulatory Visit: Payer: Medicare HMO | Attending: Pulmonary Disease

## 2021-08-14 DIAGNOSIS — R058 Other specified cough: Secondary | ICD-10-CM | POA: Insufficient documentation

## 2021-08-14 DIAGNOSIS — R0609 Other forms of dyspnea: Secondary | ICD-10-CM | POA: Diagnosis not present

## 2021-08-14 DIAGNOSIS — R0600 Dyspnea, unspecified: Secondary | ICD-10-CM | POA: Diagnosis not present

## 2021-08-14 DIAGNOSIS — Z87891 Personal history of nicotine dependence: Secondary | ICD-10-CM | POA: Diagnosis not present

## 2021-08-14 DIAGNOSIS — Z8616 Personal history of COVID-19: Secondary | ICD-10-CM | POA: Insufficient documentation

## 2021-08-16 LAB — ECHOCARDIOGRAM COMPLETE
AR max vel: 2.35 cm2
AV Area VTI: 2.56 cm2
AV Area mean vel: 2.22 cm2
AV Mean grad: 2 mmHg
AV Peak grad: 4.2 mmHg
Ao pk vel: 1.03 m/s
Area-P 1/2: 3.11 cm2
Calc EF: 65.2 %
S' Lateral: 3 cm
Single Plane A2C EF: 65.7 %
Single Plane A4C EF: 64.5 %

## 2021-08-18 ENCOUNTER — Ambulatory Visit: Payer: Medicare HMO | Admitting: Pulmonary Disease

## 2021-08-23 DIAGNOSIS — J9601 Acute respiratory failure with hypoxia: Secondary | ICD-10-CM | POA: Diagnosis not present

## 2021-08-23 DIAGNOSIS — U071 COVID-19: Secondary | ICD-10-CM | POA: Diagnosis not present

## 2021-09-02 DIAGNOSIS — U071 COVID-19: Secondary | ICD-10-CM | POA: Diagnosis not present

## 2021-09-02 DIAGNOSIS — J9601 Acute respiratory failure with hypoxia: Secondary | ICD-10-CM | POA: Diagnosis not present

## 2021-09-17 ENCOUNTER — Other Ambulatory Visit: Payer: Self-pay | Admitting: Family Medicine

## 2021-09-17 DIAGNOSIS — F4323 Adjustment disorder with mixed anxiety and depressed mood: Secondary | ICD-10-CM

## 2021-09-17 DIAGNOSIS — K219 Gastro-esophageal reflux disease without esophagitis: Secondary | ICD-10-CM

## 2021-09-18 NOTE — Telephone Encounter (Signed)
Requested Prescriptions  Pending Prescriptions Disp Refills  . famotidine (PEPCID) 20 MG tablet [Pharmacy Med Name: FAMOTIDINE 20 MG TAB] 30 tablet 0    Sig: TAKE 1 TABLET BY MOUTH TWICE DAILY     Gastroenterology:  H2 Antagonists Passed - 09/17/2021  2:12 PM      Passed - Valid encounter within last 12 months    Recent Outpatient Visits          1 month ago Centrilobular emphysema (HCC)   [facility] [name], DO   4 months ago Centrilobular emphysema [facility])   [facility] [name], DO   5 months ago Annual physical exam   [facility] [name], DO   7 months ago Centrilobular emphysema [facility])   [facility] [name], DO   7 months ago Post-COVID-19 syndrome   [facility] Althea Charon, [name], DO      Future Appointments            In 9 months [facility]            . busPIRone (BUSPAR) 5 MG tablet [Pharmacy Med Name: BUSPIRONE HCL 5 MG TAB] 60 tablet 2    Sig: TAKE 1 TABLET BY MOUTH TWICE DAILY AS NEEDED FOR ANXIETY     Psychiatry: Anxiolytics/Hypnotics - Non-controlled Passed - 09/17/2021  2:12 PM      Passed - Valid encounter within last 6 months    Recent Outpatient Visits          1 month ago Centrilobular emphysema (HCC)   [facility] [name], DO   4 months ago Centrilobular emphysema [facility])   [facility] [name], DO   5 months ago Annual physical exam   [facility] [name], DO   7 months ago Centrilobular emphysema [facility])   [facility] [name], DO   7 months ago Post-COVID-19 syndrome   [facility] Althea Charon, [name], DO      Future Appointments            In 9 months [facility]

## 2021-09-22 DIAGNOSIS — J9601 Acute respiratory failure with hypoxia: Secondary | ICD-10-CM | POA: Diagnosis not present

## 2021-09-22 DIAGNOSIS — U071 COVID-19: Secondary | ICD-10-CM | POA: Diagnosis not present

## 2021-09-30 ENCOUNTER — Ambulatory Visit (INDEPENDENT_AMBULATORY_CARE_PROVIDER_SITE_OTHER): Payer: Medicare HMO | Admitting: Licensed Clinical Social Worker

## 2021-09-30 DIAGNOSIS — E782 Mixed hyperlipidemia: Secondary | ICD-10-CM

## 2021-09-30 DIAGNOSIS — R7303 Prediabetes: Secondary | ICD-10-CM

## 2021-09-30 DIAGNOSIS — F4323 Adjustment disorder with mixed anxiety and depressed mood: Secondary | ICD-10-CM

## 2021-10-01 ENCOUNTER — Telehealth: Payer: Medicare HMO

## 2021-10-02 DIAGNOSIS — U071 COVID-19: Secondary | ICD-10-CM | POA: Diagnosis not present

## 2021-10-02 DIAGNOSIS — J9601 Acute respiratory failure with hypoxia: Secondary | ICD-10-CM | POA: Diagnosis not present

## 2021-10-05 NOTE — Patient Instructions (Signed)
Visit Information   Goals Addressed             This Visit's Progress    Management of grief symptoms   On track    Patient Goals/Self-Care Activities: Over the next 120 days Continue compliance with medication management Attend all scheduled appointments with providers Contact office with any questions or concerns Avoid negative self-talk Develop a plan to deal with triggers like holidays, anniversaries Spend time or talk with others every day Practice positive thinking and self-talk        Patient verbalizes understanding of instructions provided today.  Telephone follow up appointment with care management team member scheduled for:11/11/21  Jenel Lucks, MSW, LCSW [facility] Management [facility]  [facility] [email or web address] Phone [phone] 1:42 AM
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
## 2021-10-06 DIAGNOSIS — E782 Mixed hyperlipidemia: Secondary | ICD-10-CM

## 2021-10-06 DIAGNOSIS — F4323 Adjustment disorder with mixed anxiety and depressed mood: Secondary | ICD-10-CM | POA: Diagnosis not present

## 2021-10-13 ENCOUNTER — Other Ambulatory Visit: Payer: Self-pay | Admitting: Obstetrics and Gynecology

## 2021-10-13 ENCOUNTER — Other Ambulatory Visit: Payer: Self-pay | Admitting: Family Medicine

## 2021-10-13 DIAGNOSIS — Z1231 Encounter for screening mammogram for malignant neoplasm of breast: Secondary | ICD-10-CM

## 2021-10-22 ENCOUNTER — Telehealth: Payer: Self-pay | Admitting: Pharmacist

## 2021-10-22 ENCOUNTER — Ambulatory Visit (INDEPENDENT_AMBULATORY_CARE_PROVIDER_SITE_OTHER): Payer: Medicare HMO | Admitting: Pharmacist

## 2021-10-22 DIAGNOSIS — E782 Mixed hyperlipidemia: Secondary | ICD-10-CM

## 2021-10-22 DIAGNOSIS — J432 Centrilobular emphysema: Secondary | ICD-10-CM

## 2021-10-22 DIAGNOSIS — K219 Gastro-esophageal reflux disease without esophagitis: Secondary | ICD-10-CM

## 2021-10-22 DIAGNOSIS — E559 Vitamin D deficiency, unspecified: Secondary | ICD-10-CM

## 2021-10-22 MED ORDER — ROSUVASTATIN CALCIUM 5 MG PO TABS: 5.0000 mg | ORAL_TABLET | Freq: Every day | ORAL | 3 refills | Status: DC

## 2021-10-22 MED ORDER — FAMOTIDINE 20 MG PO TABS: 20.0000 mg | ORAL_TABLET | Freq: Two times a day (BID) | ORAL | 3 refills | Status: DC

## 2021-10-22 MED ORDER — ALBUTEROL SULFATE HFA 108 (90 BASE) MCG/ACT IN AERS: 2.0000 | INHALATION_SPRAY | RESPIRATORY_TRACT | 3 refills | Status: DC | PRN

## 2021-10-22 NOTE — Patient Instructions (Signed)
Visit Information  Thank you for taking time to visit with me today. Please don't hesitate to contact me if I can be of assistance to you before our next scheduled telephone appointment.  Our goal bad cholesterol, or LDL, is less than 70. This is why it is important to continue taking your rosuvastatin and ezetimibe.  Feel free to call me with any questions or concerns. I look forward to our next call!  Estelle Grumbles, PharmD, Patsy [place], CPP Clinical Pharmacist [facility] [phone]  If you need to cancel or re-schedule our visit, please call [phone] and our care guide team will be happy to assist you.   The patient verbalized understanding of instructions, educational materials, and care plan provided today and declined offer to receive copy of patient instructions, educational materials, and care plan.

## 2021-10-22 NOTE — Chronic Care Management (AMB) (Signed)
Chronic Care Management CCM Pharmacy Note  10/22/2021 Name:  Brandi [name] MRN:  [id number] DOB:  08-19-1956  Subjective: Brandi [name] is an 65 y.o. year old female who is a primary patient of [name], DO.  The CCM team was consulted for assistance with disease management and care coordination needs.    Engaged with patient by telephone for follow up visit for pharmacy case management and/or care coordination services.   Objective:  Medications Reviewed Today     Reviewed by [name], DO (Physician) on 08/12/21 at 1118  Med List Status: <None>   Medication Order Taking? Sig Documenting Provider Last Dose Status Informant  albuterol (VENTOLIN HFA) 108 (90 Base) MCG/ACT inhaler [id number] Yes Inhale 2 puffs into the lungs every 4 (four) hours as needed for wheezing or shortness of breath. [name], DO Taking Active   alendronate (FOSAMAX) 70 MG tablet [id number] Yes Take 1 tablet (70 mg total) by mouth once a week. Take with a full glass of water on an empty stomach. [name], MD Taking Active            Med Note [name], [name] A   Wed Jul 02, 2021  1:19 PM)    alum & mag hydroxide-simeth (MAALOX/MYLANTA) 200-200-20 MG/5ML suspension [id number] Yes Take 30 mLs by mouth every 4 (four) hours as needed for indigestion or heartburn. [name], DO Taking Active   aspirin EC 81 MG tablet [id number] Yes Take 1 tablet (81 mg total) by mouth daily. [name], MD Taking Active Self  BREZTRI AEROSPHERE 160-9-4.8 MCG/ACT [name] [id number] Yes Inhale 2 puffs into the lungs in the morning and at bedtime. [name], DO Taking Active   Budeson-Glycopyrrol-Formoterol (BREZTRI AEROSPHERE) 160-9-4.8 MCG/ACT [name] [id number] Yes Inhale 2 puffs into the lungs in the morning and at bedtime. [name], MD Taking Active   busPIRone (BUSPAR) 5 MG tablet [id number] Yes Take 1 tablet (5 mg total) by mouth 2 (two) times  daily as needed (anxiety). [name], DO Taking Active   Calcium Carbonate-Vit D-Min (CALCIUM 1200) 1200-1000 MG-UNIT CHEW [id number] Yes Chew 1,200 mg by mouth daily. [name], MD Taking Active   Cholecalciferol (VITAMIN D3) 125 MCG (5000 UT) TABS [id number] Yes Take by mouth. Vitamin D3 5,000 IU daily for 12 weeks then reduce to OTC Vitamin D3 2,000 IU daily for maintenance [provider] Taking Active   ezetimibe (ZETIA) 10 MG tablet [id number] Yes Take 1 tablet (10 mg total) by mouth daily. [name], MD Taking Active   famotidine (PEPCID) 20 MG tablet [id number] Yes TAKE 1 TABLET BY MOUTH TWICE DAILY [name], [name], DO Taking Active   feeding supplement (ENSURE ENLIVE / ENSURE PLUS) LIQD [id number] Yes Take 237 mLs by mouth 2 (two) times daily between meals. [name], DO Taking Active   Multiple Vitamin (MULTIVITAMIN WITH MINERALS) TABS tablet [id number] Yes Take 1 tablet by mouth daily. [name], DO Taking Active   OXYGEN [id number] Yes Inhale into the lungs at bedtime as needed. [provider] Taking Active   rosuvastatin (CRESTOR) 20 MG tablet [id number] Yes Take 1 tablet (20 mg total) by mouth daily. [name], MD Taking Active   sodium chloride (OCEAN) 0.65 % SOLN nasal spray [id number] Yes Place 1 spray into both nostrils as needed for congestion. [provider] Taking Active   Specialty Vitamins Products (HAIR NOURISHING SUPPLEMENT PO) [id number] Yes Take 1 tablet by  mouth daily. [provider] Taking Active   triamcinolone cream (KENALOG) 0.5 % [id number] Yes Apply 1 application topically 2 (two) times daily. To affected areas, for up to 2 weeks. [name], DO Taking Active             Pertinent Labs:  Lab Results  Component Value Date   HGBA1C 5.8 (H) 01/20/2021   Lab Results  Component Value Date   CHOL 188 01/20/2021   HDL 61 01/20/2021   LDLCALC 111 (H)  01/20/2021   TRIG 73 01/20/2021   CHOLHDL 3.1 01/20/2021   Lab Results  Component Value Date   CREATININE 0.58 01/20/2021   BUN 7 01/20/2021   NA 142 01/20/2021   K 4.1 01/20/2021   CL 106 01/20/2021   CO2 30 01/20/2021    SDOH:  (Social Determinants of Health) assessments and interventions performed:    CCM Care Plan  Review of patient past medical history, allergies, medications, health status, including review of consultants reports, laboratory and other test data, was performed as part of comprehensive evaluation and provision of chronic care management services.   Care Plan : PharmD - Medication Assistance/Adherence  Updates made by [name], RPH-CPP since 10/22/2021 12:00 AM     Problem: Disease Progression      Long-Range Goal: Disease Progression Prevented or Minimized   Start Date: 04/30/2021  Expected End Date: 07/29/2021  This Visit's Progress: On track  Recent Progress: On track  Priority: High  Note:   Current Barriers:  Unable to independently afford treatment regimen Reports cost of Breztri inhaler unaffordable through [facility] health plan Patient enrolled in [facility] 2022 COPD Inhaler Support Program  Pharmacist Clinical Goal(s):  Over the next 90 days, patient will verbalize ability to afford treatment regimen through collaboration with PharmD and provider.   Interventions: 1:1 collaboration with [name], DO regarding development and update of comprehensive plan of care as evidenced by provider attestation and co-signature Inter-disciplinary care team collaboration (see longitudinal plan of care) Perform chart review Patient seen for Office Visit with PCP on 9/6. Provider advised: Can trial holding statin for 2 weeks to see if symptoms resolve if they do then she may need lower dose, switch dose or intermittent dosing, can notify us or cardiology Today patient requests renewal of albuterol and famotidine Rx For  famotidine Rx, previously has been written for 15 day supply (30 tablets). Reports she is using as needed, but BID most days for control of acid symptoms. Patient interested in receiving 30 or 90 day supply Collaborate with clinical team/PCP  COPD: Current treatment: Breztri inhaler - 2 puffs in morning and at bedtime Albuterol inhaler - 1 puffs every 4 hours as needed Overnight oxygen Confirms using Breztri inhaler twice daily (and rinse out mouth after use) and use albuterol inhaler as needed as directed Confirms has sufficient supply of Breztri from [employer] through end of 2022 calendar year Reports planning to switch insurance plans in 2023  Medication Assistance: Reports planning to switch to new health plan in 2023, [facility] Plan, so will no longer be eligible to receive Breztri through [facility] COPD Inhaler Support program Based on patient report, meets income requirement for Extra Help through [employer], but is concerned that would not qualify based on other resources Based on reported income, patient would qualify for patient assistance for Breztri inhaler from [employer], but program would require patient to have completed Extra Help application Counsel patient on options for  completing Extra Help application online or paper application from [employer] office. Offer to assist patient with Extra Help application over the phone Patient prefers to complete paper copy of application. States will call Social Security office to request paper copy of Extra Help application Counsel patient to complete and submit paper copy back to [employer] and then expect a response in the mail within ~4-6 weeks. Advise patient to keep a copy of response, even if a denial letter, to use for patient assistance application  Hyperlipidemia: Current treatment: Ezetimibe 10 mg daily Reports stopped rosuvastatin as discussed with PCP and numbness and tingling improved, but still  has occasional numbness and tingling.  Patient agreeable to restart a low dose of rosuvastatin 5 mg daily CPP will send Rx to pharmacy  Vitamin D deficiency: Current treatment: OTC Vitamin D3 5,000 IU every OTHER daily Patient will pick up OTC Vitamin D3 2,000 iu to take daily for maintenance once used up current supply   Medication Adherence: Confirms using weekly pillbox as adherence aid   Preventative Care: Encourage patient to consider obtaining annual influenza vaccine and COVID-19 bivalent vaccine  Patient Goals/Self-Care Activities Over the next 90 days, patient will:  - take medications as prescribed - collaborate with provider on medication access solutions - attend medical appointments as scheduled Next appointment with PCP on 9/6 Next appointment with Pulmonology on 9/12  Follow Up Plan: Telephone follow up appointment with care management team member scheduled for: 12/14 at 2:30 pm      Estelle Grumbles, PharmD, BCACP, CPP Clinical Pharmacist [facility] [phone]

## 2021-10-22 NOTE — Telephone Encounter (Signed)
Alright. Sent rx Albuterol refill and increase pill count for famotidine to 90 day or 180 pills  Thanks  [name], DO Evlyn Kanner [facility] 10/22/2021, 3:12 PM

## 2021-10-22 NOTE — Telephone Encounter (Signed)
Patient requesting renewal of albuterol Rx.  Also, requesting renewal of famotidine Rx. Previously has been written for 15 day supply (30 tablets). Reports she is using as needed, but BID most days. Patient interested in receiving 30 day supply, or even 90 day supply at a time - would you consider increasing quantity for renewal?  Thank you,  Estelle Grumbles, PharmD, P[place]y [place], CPP Clinical Pharmacist [facility] [phone]

## 2021-11-05 DIAGNOSIS — E782 Mixed hyperlipidemia: Secondary | ICD-10-CM

## 2021-11-05 DIAGNOSIS — Z7951 Long term (current) use of inhaled steroids: Secondary | ICD-10-CM

## 2021-11-05 DIAGNOSIS — J432 Centrilobular emphysema: Secondary | ICD-10-CM | POA: Diagnosis not present

## 2021-11-05 DIAGNOSIS — Z87891 Personal history of nicotine dependence: Secondary | ICD-10-CM | POA: Diagnosis not present

## 2021-11-10 ENCOUNTER — Other Ambulatory Visit: Payer: Self-pay | Admitting: Obstetrics and Gynecology

## 2021-11-10 DIAGNOSIS — M810 Age-related osteoporosis without current pathological fracture: Secondary | ICD-10-CM

## 2021-11-11 ENCOUNTER — Ambulatory Visit: Payer: Medicare HMO | Admitting: Licensed Clinical Social Worker

## 2021-11-11 DIAGNOSIS — R7303 Prediabetes: Secondary | ICD-10-CM

## 2021-11-11 DIAGNOSIS — I2510 Atherosclerotic heart disease of native coronary artery without angina pectoris: Secondary | ICD-10-CM

## 2021-11-11 DIAGNOSIS — I700 Atherosclerosis of aorta: Secondary | ICD-10-CM

## 2021-11-11 DIAGNOSIS — F4323 Adjustment disorder with mixed anxiety and depressed mood: Secondary | ICD-10-CM

## 2021-11-12 NOTE — Patient Instructions (Signed)
Visit Information  Thank you for taking time to visit with me today. Please don't hesitate to contact me if I can be of assistance to you before our next scheduled telephone appointment.  Following are the goals we discussed today:  Patient Goals/Self-Care Activities: Over the next 120 days Continue compliance with medication management Attend all scheduled appointments with providers Contact office with any questions or concerns Avoid negative self-talk Develop a plan to deal with triggers like holidays, anniversaries Spend time or talk with others every day Practice positive thinking and self-talk  Our next appointment is by telephone on 01/06/22 at 1:00 PM  Please call the care guide team at [phone] if you need to cancel or reschedule your appointment.   If you are experiencing a Mental Health or Behavioral Health Crisis or need someone to talk to, please call the Suicide and Crisis Lifeline: 988 call 911   Patient verbalizes understanding of instructions provided today  Jenel Lucks, MSW, LCSW [facility] Care Management [facility]  [facility] [email or web address] Phone [phone] 3:33 PM
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
## 2021-11-17 ENCOUNTER — Encounter: Payer: Self-pay | Admitting: Medical

## 2021-11-17 ENCOUNTER — Other Ambulatory Visit
Admission: RE | Admit: 2021-11-17 | Discharge: 2021-11-17 | Disposition: A | Discharge status: Home / Self Care | Payer: Medicare HMO | Source: Ambulatory Visit | Attending: Pulmonary Disease | Admitting: Pulmonary Disease

## 2021-11-17 ENCOUNTER — Ambulatory Visit (INDEPENDENT_AMBULATORY_CARE_PROVIDER_SITE_OTHER): Payer: Medicare HMO | Admitting: Medical

## 2021-11-17 ENCOUNTER — Ambulatory Visit: Payer: Medicare HMO | Admitting: Cardiovascular Disease

## 2021-11-17 ENCOUNTER — Ambulatory Visit (INDEPENDENT_AMBULATORY_CARE_PROVIDER_SITE_OTHER): Payer: Medicare HMO | Admitting: Pulmonary Disease

## 2021-11-17 ENCOUNTER — Ambulatory Visit (INDEPENDENT_AMBULATORY_CARE_PROVIDER_SITE_OTHER): Payer: Medicare HMO

## 2021-11-17 ENCOUNTER — Other Ambulatory Visit: Payer: Self-pay

## 2021-11-17 ENCOUNTER — Telehealth: Payer: Self-pay | Admitting: Cardiovascular Disease

## 2021-11-17 ENCOUNTER — Encounter: Payer: Self-pay | Admitting: Pulmonary Disease

## 2021-11-17 VITALS — BP 100/80 | HR 89 | Temp 97.0°F | Ht 64.0 in | Wt 127.6 lb

## 2021-11-17 VITALS — BP 115/71 | HR 76 | Ht 64.0 in | Wt 127.0 lb

## 2021-11-17 DIAGNOSIS — Z87891 Personal history of nicotine dependence: Secondary | ICD-10-CM

## 2021-11-17 DIAGNOSIS — R0602 Shortness of breath: Secondary | ICD-10-CM | POA: Diagnosis not present

## 2021-11-17 DIAGNOSIS — I2510 Atherosclerotic heart disease of native coronary artery without angina pectoris: Secondary | ICD-10-CM

## 2021-11-17 DIAGNOSIS — J432 Centrilobular emphysema: Secondary | ICD-10-CM | POA: Diagnosis not present

## 2021-11-17 DIAGNOSIS — R009 Unspecified abnormalities of heart beat: Secondary | ICD-10-CM

## 2021-11-17 DIAGNOSIS — I700 Atherosclerosis of aorta: Secondary | ICD-10-CM

## 2021-11-17 DIAGNOSIS — R001 Bradycardia, unspecified: Secondary | ICD-10-CM

## 2021-11-17 DIAGNOSIS — I499 Cardiac arrhythmia, unspecified: Secondary | ICD-10-CM

## 2021-11-17 DIAGNOSIS — J449 Chronic obstructive pulmonary disease, unspecified: Secondary | ICD-10-CM

## 2021-11-17 DIAGNOSIS — E782 Mixed hyperlipidemia: Secondary | ICD-10-CM

## 2021-11-17 DIAGNOSIS — G4736 Sleep related hypoventilation in conditions classified elsewhere: Secondary | ICD-10-CM

## 2021-11-17 DIAGNOSIS — R002 Palpitations: Secondary | ICD-10-CM | POA: Diagnosis not present

## 2021-11-17 DIAGNOSIS — J439 Emphysema, unspecified: Secondary | ICD-10-CM | POA: Diagnosis not present

## 2021-11-17 MED ORDER — ALBUTEROL SULFATE (2.5 MG/3ML) 0.083% IN NEBU: 2.5000 mg | INHALATION_SOLUTION | Freq: Four times a day (QID) | RESPIRATORY_TRACT | 12 refills | Status: DC | PRN

## 2021-11-17 MED ORDER — BREZTRI AEROSPHERE 160-9-4.8 MCG/ACT IN AERO: 160.0000 ug | INHALATION_SPRAY | Freq: Every day | RESPIRATORY_TRACT | 0 refills | Status: DC

## 2021-11-17 NOTE — Telephone Encounter (Signed)
Patient returning call States not sure what it was in regards to  Please call to clarify

## 2021-11-17 NOTE — Patient Instructions (Addendum)
Continue using the Breztri 2 puffs twice a day  I recommend that he continue using oxygen at nighttime every night.  Continue using albuterol as needed  We have sent in a prescription for a nebulizer machine  We have sent in a prescription for the solution to put in the nebulizer machine  We will have Dr. Mariah Milling recheck issues with your heart since you are heart rate drop significantly when you walk  We will see her in follow-up in 2 to 3 months time call sooner should any new problems arise

## 2021-11-17 NOTE — Patient Instructions (Signed)
Medication Instructions:  Your physician recommends that you continue on your current medications as directed. Please refer to the Current Medication list given to you today.  *If you need a refill on your cardiac medications before your next appointment, please call your pharmacy*   Lab Work: Bmp, Tsh, Mag today  If you have labs (blood work) drawn today and your tests are completely normal, you will receive your results only by: MyChart Message (if you have MyChart) OR A paper copy in the mail If you have any lab test that is abnormal or we need to change your treatment, we will call you to review the results.   Testing/Procedures: Your physician has recommended that you wear a Zio monitor XT. To be worn for 14 days.  The monitor will be mailed to your home. Please follow the application and return instructions that come with the monitor.  This monitor is a medical device that records the heart's electrical activity. Doctors most often use these monitors to diagnose arrhythmias. Arrhythmias are problems with the speed or rhythm of the heartbeat. The monitor is a small device applied to your chest. You can wear one while you do your normal daily activities. While wearing this monitor if you have any symptoms to push the button and record what you felt. Once you have worn this monitor for the period of time provider prescribed (Usually 14 days), you will return the monitor device in the postage paid box. Once it is returned they will download the data collected and provide [place] with a report which the provider will then review and we will call you with those results. Important tips:  Avoid showering during the first 24 hours of wearing the monitor. Avoid excessive sweating to help maximize wear time. Do not submerge the device, no hot tubs, and no swimming pools. Keep any lotions or oils away from the patch. After 24 hours you may shower with the patch on. Take brief showers with your back  facing the shower head.  Do not remove patch once it has been placed because that will interrupt data and decrease adhesive wear time. Push the button when you have any symptoms and write down what you were feeling. Once you have completed wearing your monitor, remove and place into box which has postage paid and place in your outgoing mailbox.  If for some reason you have misplaced your box then call our office and we can provide another box and/or mail it off for you.      Follow-Up: At [facility], you and your health needs are our priority.  As part of our continuing mission to provide you with exceptional heart care, we have created designated Provider Care Teams.  These Care Teams include your primary Cardiologist (physician) and Advanced Practice Providers (APPs -  Physician Assistants and Nurse Practitioners) who all work together to provide you with the care you need, when you need it.  We recommend signing up for the patient portal called "MyChart".  Sign up information is provided on this After Visit Summary.  MyChart is used to connect with patients for Virtual Visits (Telemedicine).  Patients are able to view lab/test results, encounter notes, upcoming appointments, etc.  Non-urgent messages can be sent to your provider as well.   To learn more about what you can do with MyChart, go to [email or web address].    Your next appointment:   5 week(s)  The format for your next appointment:   In Person  Provider:  Julien Nordmann, MD{   Other Instructions N/A

## 2021-11-17 NOTE — Progress Notes (Signed)
Subjective:    Patient ID: Brandi [name], female    DOB: 11-09-1956, 65 y.o.   MRN: [id number] Chief Complaint  Patient presents with   Follow-up    HPI Patient is a 65 year old former smoker (70 PY) who presents for follow-up on the issue of COPD.  She was initially evaluated here on 25 June 2021.  This is a scheduled visit.  Recall that the patient had COVID-19 in January 2022 she was admitted to [facility] between 16 December 2020 through 23 December 2020.  She had acute respiratory failure with hypoxia and was discharged home on 2 L/min nasal cannula O2 24/7.  She was subsequently followed by [facility] and was weaned off of O2 during the day.  She however remained on oxygen nocturnally.  She states that she only uses this "sometimes".  After her initial visit here she had an overnight oximetry on room air that confirmed that she needs to continue using oxygen nocturnally however she has continued to use it only sporadically.  I have advised her that the oxygen will be discontinued if she is not compliant.  She has been on Breztri 2 puffs twice a day which she notes helped some with her breathing however she continues to have significant dyspnea on exertion.  She had pulmonary function testing performed on 14 August 2021 that shows that she has severe COPD with numbers as below.  Her FEV1 is in the 46 percentile.  She had also had a CT chest performed in May 2022 for lung cancer screening this showed moderate to severe paraseptal and centrilobular emphysema.  The patient states today that she never had COPD before COVID.  I have advised her that this is a chronic disease and that was not caused by COVID but by her prior smoking.  CT performed in May also showed coronary artery calcifications.  Since her prior visit she also has been having significant tachypalpitations particularly during exertion.  Sensation of chest discomfort during exertion as well.  She also notes sneezing and increasing  shortness of breath when dealing with her horses in the barn.  She wonders if she may have some allergies.  Today a nebulizer machine.  DATA 04/09/2021 LDCT: Moderate to severe paraseptal and centrilobular emphysema.  Left upper lobe nodule measuring 2 mm (decreased from prior), compression fractures T7 and T9 noted previously and unchanged.  Coronary artery calcifications. 07/04/2021 alpha-1 antitrypsin: Phenotype MM, level 140 mg/dL (normal) 07/09/2021 overnight oximetry: Baseline O2 sat 90% during sleep with desaturations as low as 83% during the night desaturation events lasted for over an hour. 08/14/2021 PFTs: FEV1 1.13 L or 46% predicted, FVC 2.42 L or 76% predicted, FEV1/FVC 47%, there is a mild response to bronchodilator with a 10% net change.  There is hyperinflation with TLC at 128% and air trapping with RV at 188%.  Diffusion capacity is moderately to severely reduced by Kco.  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  Review of Systems A 10 point review of systems was performed and it is as noted above otherwise negative.  Patient Active Problem List   Diagnosis Date Noted   Pre-diabetes 04/08/2021   Vitamin D deficiency 04/08/2021   COVID-19 vaccination declined 12/04/2020   Centrilobular emphysema ([place]) 04/08/2020   Nodule of upper lobe of left lung 04/08/2020   Aortic atherosclerosis ([place]) 05/21/2019   Coronary artery calcification seen on CT scan 05/21/2019   Former smoker 05/21/2019   Hyperlipidemia 05/21/2019   Social History  Tobacco Use   Smoking status: Former    Packs/day: 0.75    Years: 41.00    Pack years: 30.75    Types: E-cigarettes, Cigarettes    Quit date: 12/08/2015    Years since quitting: 5.9   Smokeless tobacco: Former  Substance Use Topics   Alcohol use: No   No Known Allergies Current Meds  Medication Sig   albuterol (VENTOLIN HFA) 108 (90 Base) MCG/ACT inhaler Inhale 2 puffs into the lungs every 4 (four) hours as needed for  wheezing or shortness of breath.   alendronate (FOSAMAX) 70 MG tablet TAKE ONE TABLET BY MOUTH EACH WEEK, ON AN EMPTY STOMACH BEFORE BREAKFAST WITH 8oz OF WATER AND REMAIN UPRIGHT FOR :30   alum & mag hydroxide-simeth (MAALOX/MYLANTA) 200-200-20 MG/5ML suspension Take 30 mLs by mouth every 4 (four) hours as needed for indigestion or heartburn.   aspirin EC 81 MG tablet Take 1 tablet (81 mg total) by mouth daily.   BREZTRI AEROSPHERE 160-9-4.8 MCG/ACT AERO Inhale 2 puffs into the lungs in the morning and at bedtime.   busPIRone (BUSPAR) 5 MG tablet TAKE 1 TABLET BY MOUTH TWICE DAILY AS NEEDED FOR ANXIETY   Calcium Carbonate-Vit D-Min (CALCIUM 1200) 1200-1000 MG-UNIT CHEW Chew 1,200 mg by mouth daily.   Cholecalciferol (VITAMIN D3) 125 MCG (5000 UT) TABS Take by mouth. Vitamin D3 5,000 IU daily for 12 weeks then reduce to OTC Vitamin D3 2,000 IU daily for maintenance   ezetimibe (ZETIA) 10 MG tablet Take 1 tablet (10 mg total) by mouth daily.   famotidine (PEPCID) 20 MG tablet Take 1 tablet (20 mg total) by mouth 2 (two) times daily.   feeding supplement (ENSURE ENLIVE / ENSURE PLUS) LIQD Take 237 mLs by mouth 2 (two) times daily between meals.   Multiple Vitamin (MULTIVITAMIN WITH MINERALS) TABS tablet Take 1 tablet by mouth daily.   OXYGEN Inhale into the lungs at bedtime as needed.   rosuvastatin (CRESTOR) 5 MG tablet Take 1 tablet (5 mg total) by mouth daily.   sodium chloride (OCEAN) 0.65 % SOLN nasal spray Place 1 spray into both nostrils as needed for congestion.   Specialty Vitamins Products (HAIR NOURISHING SUPPLEMENT PO) Take 1 tablet by mouth daily.   triamcinolone cream (KENALOG) 0.5 % Apply 1 application topically 2 (two) times daily. To affected areas, for up to 2 weeks.   Immunization History  Administered Date(s) Administered   [employer] Vaccination 05/26/2021, 06/24/2021       Objective:   Physical Exam BP 100/80 (BP Location: Left Arm, Patient Position: Sitting,  Cuff Size: Normal)   Pulse 89   Temp (!) 97 F (36.1 C) (Oral)   Ht 5\' 4"  (1.626 m)   Wt 127 lb 9.6 oz (57.9 kg)   BMI 21.90 kg/m   GENERAL: Well-developed, well-nourished woman, no acute distress.  No conversational dyspnea. HEAD: Normocephalic, atraumatic.  EYES: Pupils equal, round, reactive to light.  No scleral icterus.  MOUTH: Nose/mouth/throat not examined due to masking requirements for COVID 19. NECK: Supple. No thyromegaly. Trachea midline. No JVD.  No adenopathy. PULMONARY: Good air entry bilaterally.  No adventitious sounds. CARDIOVASCULAR: S1 and S2. Regular rate and rhythm.  No rubs, murmurs or gallops heard. ABDOMEN: Benign. MUSCULOSKELETAL: No joint deformity, no clubbing, no edema.  NEUROLOGIC: No focal deficit, no gait disturbance, speech is fluent. SKIN: Intact,warm,dry. PSYCH: Mood and behavior normal.  Ambulatory oximetry was performed today: At rest oxygen saturation 99%, heart rate 89 bpm immediately upon initiation  of ambulation patient developed bradycardia at 56 to 60 bpm at 750 feet had transient desaturation to 90% but no further.  Patient noted shortness of breath that onset of bradycardia but no chest pain or other discomfort noted.    Assessment & Plan:     ICD-10-CM   1. Stage 3 severe COPD by GOLD classification (HCC)   J44.9 AMB REFERRAL FOR DME    CANCELED: AMB REFERRAL FOR DME   Continue Breztri 2 puffs twice a day Continue as needed albuterol Mild asthmatic component Check allergen panel    2. Nocturnal hypoxemia due to emphysema (HCC)  J43.9    G47.36    Recommend she continue 2 L/min O2 during sleep It is recommended that she use this every night    3. Intermittent palpitations  R00.2    Worse during exertion Referral to cardiology Query impact on her shortness of breath    4. SOB (shortness of breath)  R06.02 Allergen Panel (27) + IGE    CBC With Differential    CANCELED: CBC With Differential   Referral to cardiology for  evaluation of cardiac sources of dyspnea Noted coronary artery calcifications on CT     Orders Placed This Encounter  Procedures   Allergen Panel (27) + IGE    Standing Status:   Future    Number of Occurrences:   1    Standing Expiration Date:   11/17/2022   CBC With Differential   AMB REFERRAL FOR DME    Referral Priority:   Routine    Referral Type:   Durable Medical Equipment Purchase    Number of Visits Requested:   1   Meds ordered this encounter  Medications   Budeson-Glycopyrrol-Formoterol (BREZTRI AEROSPHERE) 160-9-4.8 MCG/ACT AERO    Sig: Inhale 160 mcg into the lungs daily.    Dispense:  5.9 g    Refill:  0    Order Specific Question:   Lot Number?    Answer:   [id number]    Order Specific Question:   Expiration Date?    Answer:   06/05/2024    Order Specific Question:   NDC    Answer:   [id number] [[id number]]    Order Specific Question:   Quantity    Answer:   2   albuterol (PROVENTIL) (2.5 MG/3ML) 0.083% nebulizer solution    Sig: Take 3 mLs (2.5 mg total) by nebulization every 6 (six) hours as needed for wheezing or shortness of breath.    Dispense:  75 mL    Refill:  12   Patient has severe COPD currently fairly well controlled on Breztri and as needed albuterol.  However she has other symptoms that may be cardiac related.  Of note during ambulatory oximetry she developed bradycardia with only a transient drop on oxygen saturation to 90% but maintaining above 90% throughout the exercise.  She has been having tachypalpitations and chest discomfort on exertion query whether she has a cardiac component to her dyspnea.  I have discussed the case with Dr. Mariah Milling her cardiologist and this will be worked up.  We will see the patient in follow-up in 2 to 3 months time she is to contact us prior to that time should any new difficulties arise.  Gailen Shelter, MD Advanced Bronchoscopy PCCM [facility]    *This note was dictated using voice  recognition software/Dragon.  Despite best efforts to proofread, errors can occur which can change the meaning. Any transcriptional errors that result from this  process are unintentional and may not be fully corrected at the time of dictation.

## 2021-11-17 NOTE — Progress Notes (Signed)
Cardiology Office Note:    Date:  11/17/2021   ID:  Brandi [name], DOB 1955-12-08, MRN [id number]  PCP:  [name], DO  [facility] Cardiologist:  None  [facility] Electrophysiologist:  None   Referring MD: [name] *   Chief Complaint: Pulse  History of Present Illness:    Brandi [name] is a 66 y.o. female with a hx of former smoker, aortic atherosclerosis, moderate multivessel coronary calcification on CT who presents for follow-up.   Last seen 04/15/21 and was stable from a cardiac perspective.   Today, the patient reports breathing issues. Just saw pulmonology for breathing, she gave albuterol and nebulizer machine. Labs taken for allergies. No chest pain, LLE, orthopnea, pnd. Has orthopnea since COVID. Occasional dizziness or lightheaded. Pulmonology said that heart rate is decreasing when she walks. Pulmonology said she needs O2 at night, she has not been using it every night. She plans on using it every night.   BMET CBC TSH Heart monitor  Past Medical History:  Diagnosis Date   Hyperlipidemia     Past Surgical History:  Procedure Laterality Date   APPENDECTOMY     CHOLECYSTECTOMY     SHOULDER ARTHROSCOPY Left 07/02/2015   Procedure: ,left shoulder arthroscopy, decompression and debridement;  Surgeon: Christena Flake, MD;  Location: [facility] ORS;  Service: Orthopedics;  Laterality: Left;   SHOULDER CLOSED REDUCTION Left 04/09/2015   Procedure: CLOSED MANIPULATION SHOULDER;  Surgeon: Kennedy Bucker, MD;  Location: [facility] ORS;  Service: Orthopedics;  Laterality: Left;    Current Medications: Current Meds  Medication Sig   albuterol (PROVENTIL) (2.5 MG/3ML) 0.083% nebulizer solution Take 3 mLs (2.5 mg total) by nebulization every 6 (six) hours as needed for wheezing or shortness of breath.   albuterol (VENTOLIN HFA) 108 (90 Base) MCG/ACT inhaler Inhale 2 puffs into the lungs every 4 (four) hours as needed for wheezing or shortness of breath.    alendronate (FOSAMAX) 70 MG tablet TAKE ONE TABLET BY MOUTH EACH WEEK, ON AN EMPTY STOMACH BEFORE BREAKFAST WITH 8oz OF WATER AND REMAIN UPRIGHT FOR :30   alum & mag hydroxide-simeth (MAALOX/MYLANTA) 200-200-20 MG/5ML suspension Take 30 mLs by mouth every 4 (four) hours as needed for indigestion or heartburn.   aspirin EC 81 MG tablet Take 1 tablet (81 mg total) by mouth daily.   BREZTRI AEROSPHERE 160-9-4.8 MCG/ACT AERO Inhale 2 puffs into the lungs in the morning and at bedtime.   Budeson-Glycopyrrol-Formoterol (BREZTRI AEROSPHERE) 160-9-4.8 MCG/ACT AERO Inhale 160 mcg into the lungs daily.   busPIRone (BUSPAR) 5 MG tablet TAKE 1 TABLET BY MOUTH TWICE DAILY AS NEEDED FOR ANXIETY   Calcium Carbonate-Vit D-Min (CALCIUM 1200) 1200-1000 MG-UNIT CHEW Chew 1,200 mg by mouth daily.   Cholecalciferol (VITAMIN D3) 125 MCG (5000 UT) TABS Take by mouth. Vitamin D3 5,000 IU daily for 12 weeks then reduce to OTC Vitamin D3 2,000 IU daily for maintenance   ezetimibe (ZETIA) 10 MG tablet Take 1 tablet (10 mg total) by mouth daily.   famotidine (PEPCID) 20 MG tablet Take 1 tablet (20 mg total) by mouth 2 (two) times daily.   feeding supplement (ENSURE ENLIVE / ENSURE PLUS) LIQD Take 237 mLs by mouth 2 (two) times daily between meals.   Multiple Vitamin (MULTIVITAMIN WITH MINERALS) TABS tablet Take 1 tablet by mouth daily.   OXYGEN Inhale into the lungs at bedtime as needed.   rosuvastatin (CRESTOR) 5 MG tablet Take 1 tablet (5 mg total) by mouth daily.   sodium  chloride (OCEAN) 0.65 % SOLN nasal spray Place 1 spray into both nostrils as needed for congestion.   Specialty Vitamins Products (HAIR NOURISHING SUPPLEMENT PO) Take 1 tablet by mouth daily.   triamcinolone cream (KENALOG) 0.5 % Apply 1 application topically 2 (two) times daily. To affected areas, for up to 2 weeks.     Allergies:   Patient has no known allergies.   Social History   Socioeconomic History   Marital status: Married    Spouse name:  Not on file   Number of children: Not on file   Years of education: Not on file   Highest education level: Not on file  Occupational History   Not on file  Tobacco Use   Smoking status: Former    Packs/day: 0.75    Years: 41.00    Pack years: 30.75    Types: E-cigarettes, Cigarettes    Quit date: 12/08/2015    Years since quitting: 5.9   Smokeless tobacco: Former  Scientific laboratory technician Use: Never used  Substance and Sexual Activity   Alcohol use: No   Drug use: No   Sexual activity: Yes    Birth control/protection: Post-menopausal  Other Topics Concern   Not on file  Social History Narrative   Not on file   Social Determinants of Health   Financial Resource Strain: Medium Risk   Difficulty of Paying Living Expenses: Somewhat hard  Food Insecurity: No Food Insecurity   Worried About Charity fundraiser in the Last Year: Never true   Ran Out of Food in the Last Year: Never true  Transportation Needs: No Transportation Needs   Lack of Transportation (Medical): No   Lack of Transportation (Non-Medical): No  Physical Activity: Inactive   Days of Exercise per Week: 0 days   Minutes of Exercise per Session: 0 min  Stress: No Stress Concern Present   Feeling of Stress : Only a little  Social Connections: Not on file     Family History: The patient's family history includes Alzheimer's disease in her mother; Breast cancer (age of onset: 21) in her mother.  ROS:   Please see the history of present illness.     All other systems reviewed and are negative.  EKGs/Labs/Other Studies Reviewed:    The following studies were reviewed today:  Echo 08/2021 1. Left ventricular ejection fraction, by estimation, is 60 to 65%. The  left ventricle has normal function. The left ventricle has no regional  wall motion abnormalities. Left ventricular diastolic parameters are  consistent with Grade I diastolic  dysfunction (impaired relaxation). The average left ventricular global   longitudinal strain is -18.5 %. The global longitudinal strain is normal.   2. Right ventricular systolic function is normal. The right ventricular  size is normal. There is normal pulmonary artery systolic pressure. The  estimated right ventricular systolic pressure is [id number] mmHg.   3. The mitral valve is normal in structure. No evidence of mitral valve  regurgitation. No evidence of mitral stenosis.   4. The aortic valve is normal in structure. Aortic valve regurgitation is  not visualized. No aortic stenosis is present.   5. The inferior vena cava is normal in size with greater than 50%  respiratory variability, suggesting right atrial pressure of 3 mmHg.   EKG:  EKG is ordered today.  The ekg ordered today demonstrates NSR 76bpm, nonspecific T wave changes  Recent Labs: 12/20/2020: Magnesium 2.4 01/20/2021: ALT 15; BUN 7; Creat 0.58; Hemoglobin 12.1;  Platelets 475; Potassium 4.1; Sodium 142  Recent Lipid Panel    Component Value Date/Time   CHOL 188 01/20/2021 0801   CHOL 145 04/01/2020 0848   TRIG 73 01/20/2021 0801   HDL 61 01/20/2021 0801   HDL 55 04/01/2020 0848   CHOLHDL 3.1 01/20/2021 0801   LDLCALC 111 (H) 01/20/2021 0801     Physical Exam:    VS:  BP 115/71 (BP Location: Right Arm, Patient Position: Sitting, Cuff Size: Normal)   Pulse 76   Ht 5\' 4"  (1.626 m)   Wt 127 lb (57.6 kg)   SpO2 98%   BMI 21.80 kg/m     Wt Readings from Last 3 Encounters:  11/17/21 127 lb (57.6 kg)  11/17/21 127 lb 9.6 oz (57.9 kg)  08/12/21 122 lb 12.8 oz (55.7 kg)     GEN:  Well nourished, well developed in no acute distress HEENT: Normal NECK: No JVD; No carotid bruits LYMPHATICS: No lymphadenopathy CARDIAC: RRR, no murmurs, rubs, gallops RESPIRATORY:  diminished breath sounds  ABDOMEN: Soft, non-tender, non-distended MUSCULOSKELETAL:  No edema; No deformity  SKIN: Warm and dry NEUROLOGIC:  Alert and oriented x 3 PSYCHIATRIC:  Normal affect   ASSESSMENT:    1. Abnormal  heart rate   2. Aortic atherosclerosis ([place])   3. Coronary artery calcification seen on CT scan   4. Former smoker   5. Centrilobular emphysema ([place])   6. Mixed hyperlipidemia   7. Bradycardia    PLAN:    In order of problems listed above:  ?Bradycardia Patient reports just saw pulmonology earlier today who noted HR dropped when she ambulates. Also noted she had low hear rates overnight, but is not wearing needed O2. She is not on rate lowering medication. She says she will start wearing O2 at night. Orthostatics negative. EKG with SR and heart rate of 76bpm. In the office patient heart rate elevated during ambulation. I will order BMET, CBC, TSH and a 2 week heart monitor.   Coronary artery calcification on CT scan Aortic atherosclerosis No chest pain reported. Continue Aspirin, statin. Not on BB. No further work-up at this time.   HLD Continue Crestor and Zetia.  Former smoker SOB/emphysema Patient has chronic SOB, she is followed closely by pulmonology.  HFpEF Echo 08/2021 showed LVEF60-65%, no WMA, G1DD. Appears euvolemic on exam.   Disposition: Follow up in 2 month(s) with MD    Signed, Ingvald Theisen Ninfa Meeker, PA-C  11/17/2021 12:34 PM    [place]

## 2021-11-17 NOTE — Telephone Encounter (Signed)
Patient needed appt change information for 1/30 appt with Dr. Mariah Milling   Provided details. Nothing further needed at this time.

## 2021-11-18 LAB — BASIC METABOLIC PANEL
BUN/Creatinine Ratio: 14 (ref 12–28)
BUN: 12 mg/dL (ref 8–27)
CO2: 24 mmol/L (ref 20–29)
Calcium: 9.6 mg/dL (ref 8.7–10.3)
Chloride: 102 mmol/L (ref 96–106)
Creatinine, Ser: 0.87 mg/dL (ref 0.57–1.00)
Glucose: 74 mg/dL (ref 70–99)
Potassium: 4.2 mmol/L (ref 3.5–5.2)
Sodium: 140 mmol/L (ref 134–144)
eGFR: 73 mL/min/{1.73_m2} (ref >59)

## 2021-11-18 LAB — TSH: TSH: 0.969 u[IU]/mL (ref 0.450–4.500)

## 2021-11-18 LAB — MAGNESIUM: Magnesium: 2.1 mg/dL (ref 1.6–2.3)

## 2021-11-19 ENCOUNTER — Ambulatory Visit (INDEPENDENT_AMBULATORY_CARE_PROVIDER_SITE_OTHER): Payer: Medicare HMO | Admitting: Pharmacist

## 2021-11-19 DIAGNOSIS — J432 Centrilobular emphysema: Secondary | ICD-10-CM

## 2021-11-19 DIAGNOSIS — I700 Atherosclerosis of aorta: Secondary | ICD-10-CM

## 2021-11-19 NOTE — Chronic Care Management (AMB) (Signed)
Chronic Care Management CCM Pharmacy Note  11/19/2021 Name:  Brandi [name] MRN:  [id number] DOB:  1956/07/22   Subjective: Brandi [name] is an 65 y.o. year old female who is a primary patient of [name], DO.  The CCM team was consulted for assistance with disease management and care coordination needs.    Engaged with patient by telephone for follow up visit for pharmacy case management and/or care coordination services.   Objective:  Medications Reviewed Today     Reviewed by [name], RPH-CPP (Pharmacist) on 11/19/21 at 1529  Med List Status: <None>   Medication Order Taking? Sig Documenting Provider Last Dose Status Informant  albuterol (PROVENTIL) (2.5 MG/3ML) 0.083% nebulizer solution [id number]  Take 3 mLs (2.5 mg total) by nebulization every 6 (six) hours as needed for wheezing or shortness of breath. [name], MD  Active   albuterol (VENTOLIN HFA) 108 (90 Base) MCG/ACT inhaler [id number] Yes Inhale 2 puffs into the lungs every 4 (four) hours as needed for wheezing or shortness of breath. [name], DO Taking Active   alendronate (FOSAMAX) 70 MG tablet [id number]  TAKE ONE TABLET BY MOUTH EACH WEEK, ON AN EMPTY STOMACH BEFORE BREAKFAST WITH 8oz OF WATER AND REMAIN UPRIGHT FOR :30 [name], [name], MD  Active   alum & mag hydroxide-simeth (MAALOX/MYLANTA) 200-200-20 MG/5ML suspension [id number]  Take 30 mLs by mouth every 4 (four) hours as needed for indigestion or heartburn. [name], DO  Active   aspirin EC 81 MG tablet [id number]  Take 1 tablet (81 mg total) by mouth daily. [name], MD  Active Self  BREZTRI AEROSPHERE 160-9-4.8 MCG/ACT [name] [id number] Yes Inhale 2 puffs into the lungs in the morning and at bedtime. [name], DO Taking Active   Budeson-Glycopyrrol-Formoterol (BREZTRI AEROSPHERE) 160-9-4.8 MCG/ACT AERO [id number]  Inhale 160 mcg into the lungs daily. [name], MD  Active    busPIRone (BUSPAR) 5 MG tablet [id number]  TAKE 1 TABLET BY MOUTH TWICE DAILY AS NEEDED FOR ANXIETY [name], [name], DO  Active   Calcium Carbonate-Vit D-Min (CALCIUM 1200) 1200-1000 MG-UNIT CHEW [id number]  Chew 1,200 mg by mouth daily. [name], MD  Active   Cholecalciferol (VITAMIN D3) 125 MCG (5000 UT) TABS [id number]  Take by mouth. Vitamin D3 5,000 IU daily for 12 weeks then reduce to OTC Vitamin D3 2,000 IU daily for maintenance [provider]  Active   ezetimibe (ZETIA) 10 MG tablet [id number] Yes Take 1 tablet (10 mg total) by mouth daily. [name], MD Taking Active   famotidine (PEPCID) 20 MG tablet [id number]  Take 1 tablet (20 mg total) by mouth 2 (two) times daily. [name], [name], DO  Active   feeding supplement (ENSURE ENLIVE / ENSURE PLUS) LIQD [id number]  Take 237 mLs by mouth 2 (two) times daily between meals. [name], DO  Active   Multiple Vitamin (MULTIVITAMIN WITH MINERALS) TABS tablet [id number]  Take 1 tablet by mouth daily. [name], DO  Active   OXYGEN [id number]  Inhale into the lungs at bedtime as needed. [provider]  Active   rosuvastatin (CRESTOR) 5 MG tablet [id number] Yes Take 1 tablet (5 mg total) by mouth daily. [name], DO Taking Active   sodium chloride (OCEAN) 0.65 % SOLN nasal spray [id number]  Place 1 spray into both nostrils as needed for congestion. [provider]  Active   Specialty Vitamins Products (HAIR NOURISHING  SUPPLEMENT PO) [id number]  Take 1 tablet by mouth daily. [provider]  Active   triamcinolone cream (KENALOG) 0.5 % [id number]  Apply 1 application topically 2 (two) times daily. To affected areas, for up to 2 weeks. [name], DO  Active             Pertinent Labs:  Lab Results  Component Value Date   CHOL 188 01/20/2021   HDL 61 01/20/2021   LDLCALC 111 (H) 01/20/2021   TRIG 73 01/20/2021   CHOLHDL 3.1  01/20/2021   Lab Results  Component Value Date   CREATININE 0.87 11/17/2021   BUN 12 11/17/2021   NA 140 11/17/2021   K 4.2 11/17/2021   CL 102 11/17/2021   CO2 24 11/17/2021    SDOH:  (Social Determinants of Health) assessments and interventions performed:    CCM Care Plan  Review of patient past medical history, allergies, medications, health status, including review of consultants reports, laboratory and other test data, was performed as part of comprehensive evaluation and provision of chronic care management services.   Care Plan : PharmD - Medication Assistance/Adherence  Updates made by [name], RPH-CPP since 11/19/2021 12:00 AM     Problem: Disease Progression      Long-Range Goal: Disease Progression Prevented or Minimized   Start Date: 04/30/2021  Expected End Date: 07/29/2021  This Visit's Progress: On track  Recent Progress: On track  Priority: High  Note:   Current Barriers:  Unable to independently afford treatment regimen Reports cost of Breztri inhaler unaffordable through [facility] health plan Patient enrolled in [facility] 2022 COPD Inhaler Support Program  Pharmacist Clinical Goal(s):  Over the next 90 days, patient will verbalize ability to afford treatment regimen through collaboration with PharmD and provider.   Interventions: 1:1 collaboration with [name], DO regarding development and update of comprehensive plan of care as evidenced by provider attestation and co-signature Inter-disciplinary care team collaboration (see longitudinal plan of care) Perform chart review Patient seen for Office Visit with [facility] on 12/12. Provider advised: Continue using the Breztri 2 puffs twice a day Recommended she continue using oxygen at nighttime every night. Continue using albuterol as needed Sent in a prescription for a nebulizer machine and solution to put in the nebulizer machine Office Visit with  [facility] on 12/12. Provider advised: Orders placed for BMET, CBC, TSH and a 2 week heart monitor Today patient reports she is expecting to receive heart monitor in the mail  COPD: Current treatment: Breztri inhaler - 2 puffs in morning and at bedtime Albuterol inhaler - 1 puffs every 4 hours as needed Overnight oxygen Confirms using Breztri inhaler twice daily (and rinse out mouth after use) and use albuterol inhaler as needed as directed Confirms has sufficient supply of Breztri from [employer] through end of 2022 calendar year Reports planning to switch insurance plans in 2023  Medication Assistance: Reports planning to switch to new health plan in 2023, [facility] Plan, so will no longer be eligible to receive Breztri through [facility] COPD Inhaler Support program Based on patient report, meets income requirement for Extra Help through [employer], but is concerned that would not qualify based on other resources (money received from [employer]) Based on reported income, patient would qualify for patient assistance for [employer] inhaler from [employer], but program would require patient to have completed Extra Help application Counsel patient on options for completing Extra Help application online  or paper application from [employer] office. Offer to assist patient with Extra Help application over the phone Patient prefers to complete paper copy of application. Again states will call Social Security office to request paper copy of Extra Help application Have counseled patient to complete and submit paper copy back to [employer] and then expect a response in the mail within ~4-6 weeks. Advised patient to keep a copy of response, even if a denial letter, to use for patient assistance application  Hyperlipidemia: Current treatment: Ezetimibe 10 mg daily Rosuvastatin 5 mg daily Reports tolerating this dose well Previous therapies tried:  rosuvastatin 20 mg daily (numbness/tingling)   Medication Adherence: Patient uses weekly pillbox as adherence aid   Patient Goals/Self-Care Activities Over the next 90 days, patient will:  - take medications as prescribed - collaborate with provider on medication access solutions - attend medical appointments as scheduled  Follow Up Plan: Telephone follow up appointment with care management team member scheduled for: 12/19/2021 at 10 am       Estelle Grumbles, PharmD, [place], CPP Clinical Pharmacist [facility] [phone]

## 2021-11-19 NOTE — Patient Instructions (Signed)
Visit Information  Thank you for taking time to visit with me today. Please don't hesitate to contact me if I can be of assistance to you before our next scheduled telephone appointment.  Following are the goals we discussed today:   Goals Addressed             This Visit's Progress    Pharmacy Goals       Our goal bad cholesterol, or LDL, is less than 70. This is why it is important to continue taking your rosuvastatin and ezetimibe.  Feel free to call me with any questions or concerns. I look forward to our next call!   Estelle Grumbles, PharmD, [place], CPP Clinical Pharmacist [facility] [phone]         Our next appointment is by telephone on 12/19/2021 at 10 am  Please call the care guide team at [phone] if you need to cancel or reschedule your appointment.    The patient verbalized understanding of instructions, educational materials, and care plan provided today and declined offer to receive copy of patient instructions, educational materials, and care plan.

## 2021-11-20 ENCOUNTER — Encounter: Payer: Self-pay | Admitting: Pulmonary Disease

## 2021-11-20 DIAGNOSIS — R001 Bradycardia, unspecified: Secondary | ICD-10-CM | POA: Diagnosis not present

## 2021-11-20 LAB — ALLERGEN PANEL (27) + IGE
Alternaria Alternata IgE: <0.1 kU/L
Aspergillus Fumigatus IgE: <0.1 kU/L
Bahia Grass IgE: <0.1 kU/L
Bermuda Grass IgE: <0.1 kU/L
Cat Dander IgE: <0.1 kU/L
Cedar, Mountain IgE: <0.1 kU/L
Cladosporium Herbarum IgE: <0.1 kU/L
Cocklebur IgE: <0.1 kU/L
Cockroach, American IgE: <0.1 kU/L
Common Silver Birch IgE: <0.1 kU/L
D Farinae IgE: 0.29 kU/L — AB
D Pteronyssinus IgE: <0.1 kU/L
Dog Dander IgE: <0.1 kU/L
Elm, American IgE: <0.1 kU/L
Hickory, White IgE: <0.1 kU/L
IgE (Immunoglobulin E), Serum: 397 IU/mL (ref 6–495)
Johnson Grass IgE: 0.11 kU/L — AB
Kentucky Bluegrass IgE: <0.1 kU/L
Maple/Box Elder IgE: <0.1 kU/L
Mucor Racemosus IgE: <0.1 kU/L
Oak, White IgE: <0.1 kU/L
Penicillium Chrysogen IgE: <0.1 kU/L
Pigweed, Rough IgE: <0.1 kU/L
Plantain, English IgE: <0.1 kU/L
Ragweed, Short IgE: <0.1 kU/L
Setomelanomma Rostrat: <0.1 kU/L
Timothy Grass IgE: <0.1 kU/L
White Mulberry IgE: <0.1 kU/L

## 2021-12-01 DIAGNOSIS — R001 Bradycardia, unspecified: Secondary | ICD-10-CM | POA: Diagnosis not present

## 2021-12-03 ENCOUNTER — Telehealth: Payer: Self-pay | Admitting: Cardiovascular Disease

## 2021-12-03 ENCOUNTER — Ambulatory Visit: Payer: Self-pay | Admitting: *Deleted

## 2021-12-03 NOTE — Telephone Encounter (Addendum)
. °  Chief Complaint: SOB Symptoms: SOB, fullness in her upper chest Frequency: 5 days Pertinent Negatives: Patient denies fever/dizziness/cough Disposition: [x] ED /[] Urgent Care (no appt availability in office) / [] Appointment(In office/virtual)/ []  [facility]/ [] Home Care/ [x] Refused Recommended Disposition  Additional Notes:  Patient using O2 at night and during the day over the last few days. A fullness in her chest like indigestion-pepto helps briefly. Recently completed heart monitoring at home and asking for results. O2 sat without O2 now 98%. Due to recent heart study and symptoms she reported I urged ED/UC. Patient refused. Appointment made for tomorrow was earliest.Patient will call Cardiologist now. Again patient urged to go to the ED/UC.    Reason for Disposition  [1] MODERATE difficulty breathing (e.g., speaks in phrases, SOB even at rest, pulse 100-120) AND [2] NEW-onset or WORSE than normal  Answer Assessment - Initial Assessment Questions 1. RESPIRATORY STATUS: "Describe your breathing?" (e.g., wheezing, shortness of breath, unable to speak, severe coughing)      Cold air taking the breath away 2. ONSET: "When did this breathing problem begin?"      5 days ago 3. PATTERN "Does the difficult breathing come and go, or has it been constant since it started?"      constant 4. SEVERITY: "How bad is your breathing?" (e.g., mild, moderate, severe)    - MILD: No SOB at rest, mild SOB with walking, speaks normally in sentences, can lie down, no retractions, pulse < 100.    - MODERATE: SOB at rest, SOB with minimal exertion and prefers to sit, cannot lie down flat, speaks in phrases, mild retractions, audible wheezing, pulse 100-120.    - SEVERE: Very SOB at rest, speaks in single words, struggling to breathe, sitting hunched forward, retractions, pulse > 120      severe 5. RECURRENT SYMPTOM: "Have you had difficulty breathing before?" If Yes, ask: "When was the last time?"  and "What happened that time?"      Yes 6. CARDIAC HISTORY: "Do you have any history of heart disease?" (e.g., heart attack, angina, bypass surgery, angioplasty)      Yes 7. LUNG HISTORY: "Do you have any history of lung disease?"  (e.g., pulmonary embolus, asthma, emphysema)     Yes 8. CAUSE: "What do you think is causing the breathing problem?"      unsure 9. OTHER SYMPTOMS: "Do you have any other symptoms? (e.g., dizziness, runny nose, cough, chest pain, fever)     Full chest 10. O2 SATURATION MONITOR:  "Do you use an oxygen saturation monitor (pulse oximeter) at home?" If Yes, "What is your reading (oxygen level) today?" "What is your usual oxygen saturation reading?" (e.g., 95%)       90"s 11. PREGNANCY: "Is there any chance you are pregnant?" "When was your last menstrual period?"        12. TRAVEL: "Have you traveled out of the country in the last month?" (e.g., travel history, exposures)  Protocols used: Breathing Difficulty-A-AH

## 2021-12-03 NOTE — Telephone Encounter (Signed)
Called patient and reviewed the copied and pasted assessment form her previous conversation with a triage nurse today. Patient confirmed that this is correct and accurate. She wears O2 at night, but states that she has not needed it today. She is not SOB when inactive, only when exerting herself or walking outside in the cold air. I informed patient that we do not have any earlier appointments available with Dr. Mariah Milling than her scheduled appointment on 01/05/2022. I gave her a preliminary report of her Zio monitor in [place] suite that was unremarkable.  I informed her that she should go to the ER if her SOB increases, and to make sure she goes to her appointment scheduled with her PCP tomorrow.   Patient verbalized understanding and agreed with plan.  Answer Assessment - Initial Assessment Questions 1. RESPIRATORY STATUS: "Describe your breathing?" (e.g., wheezing, shortness of breath, unable to speak, severe coughing)      Cold air taking the breath away 2. ONSET: "When did this breathing problem begin?"      5 days ago 3. PATTERN "Does the difficult breathing come and go, or has it been constant since it started?"      constant 4. SEVERITY: "How bad is your breathing?" (e.g., mild, moderate, severe)    - MILD: No SOB at rest, mild SOB with walking, speaks normally in sentences, can lie down, no retractions, pulse < 100.    - MODERATE: SOB at rest, SOB with minimal exertion and prefers to sit, cannot lie down flat, speaks in phrases, mild retractions, audible wheezing, pulse 100-120.    - SEVERE: Very SOB at rest, speaks in single words, struggling to breathe, sitting hunched forward, retractions, pulse > 120      severe 5. RECURRENT SYMPTOM: "Have you had difficulty breathing before?" If Yes, ask: "When was the last time?" and "What happened that time?"      Yes 6. CARDIAC HISTORY: "Do you have any history of heart disease?" (e.g., heart attack, angina, bypass surgery, angioplasty)      Yes 7.  LUNG HISTORY: "Do you have any history of lung disease?"  (e.g., pulmonary embolus, asthma, emphysema)     Yes 8. CAUSE: "What do you think is causing the breathing problem?"      unsure 9. OTHER SYMPTOMS: "Do you have any other symptoms? (e.g., dizziness, runny nose, cough, chest pain, fever)     Full chest 10. O2 SATURATION MONITOR:  "Do you use an oxygen saturation monitor (pulse oximeter) at home?" If Yes, "What is your reading (oxygen level) today?" "What is your usual oxygen saturation reading?" (e.g., 95%)       90"s

## 2021-12-03 NOTE — Telephone Encounter (Signed)
Noted Pt added to wait list for possible sooner appt

## 2021-12-03 NOTE — Telephone Encounter (Signed)
FYI. Called pt again to let her know due to her symptoms she should go to UC/ED. Pt still refused. Pt said she will come to her appointment scheduled for tomorrow 12/29 but if she feels worse she will go to ED/UC.  KP

## 2021-12-03 NOTE — Telephone Encounter (Signed)
Pt c/o Shortness Of Breath: STAT if SOB developed within the last 24 hours or pt is noticeably SOB on the phone  1. Are you currently SOB (can you hear that pt is SOB on the phone)? yes  2. How long have you been experiencing SOB? Over a week  3. Are you SOB when sitting or when up moving around? When she is outside / moving around   4. Are you currently experiencing any other symptoms? Oxygen reading 93 HR 93 but difficult to breathe  Patient wanting to schedule an appt ASAP Unable to reach triage - sending high priority

## 2021-12-04 ENCOUNTER — Ambulatory Visit
Admission: RE | Admit: 2021-12-04 | Discharge: 2021-12-04 | Disposition: A | Discharge status: Home / Self Care | Payer: Medicare HMO | Source: Ambulatory Visit | Attending: Internal Medicine | Admitting: Internal Medicine

## 2021-12-04 ENCOUNTER — Ambulatory Visit (INDEPENDENT_AMBULATORY_CARE_PROVIDER_SITE_OTHER): Payer: Medicare HMO | Admitting: Internal Medicine

## 2021-12-04 ENCOUNTER — Other Ambulatory Visit: Payer: Self-pay

## 2021-12-04 ENCOUNTER — Encounter: Payer: Self-pay | Admitting: Internal Medicine

## 2021-12-04 ENCOUNTER — Ambulatory Visit
Admission: RE | Admit: 2021-12-04 | Discharge: 2021-12-04 | Disposition: A | Discharge status: Home / Self Care | Payer: Medicare HMO | Attending: Internal Medicine | Admitting: Internal Medicine

## 2021-12-04 VITALS — BP 103/57 | HR 93 | Temp 97.8°F | Resp 18 | Ht 64.0 in | Wt 124.6 lb

## 2021-12-04 DIAGNOSIS — K219 Gastro-esophageal reflux disease without esophagitis: Secondary | ICD-10-CM

## 2021-12-04 DIAGNOSIS — J432 Centrilobular emphysema: Secondary | ICD-10-CM | POA: Diagnosis not present

## 2021-12-04 DIAGNOSIS — R0602 Shortness of breath: Secondary | ICD-10-CM | POA: Insufficient documentation

## 2021-12-04 DIAGNOSIS — J439 Emphysema, unspecified: Secondary | ICD-10-CM | POA: Diagnosis not present

## 2021-12-04 DIAGNOSIS — R0609 Other forms of dyspnea: Secondary | ICD-10-CM

## 2021-12-04 DIAGNOSIS — J9611 Chronic respiratory failure with hypoxia: Secondary | ICD-10-CM | POA: Diagnosis not present

## 2021-12-04 MED ORDER — OMEPRAZOLE 20 MG PO CPDR: 20.0000 mg | DELAYED_RELEASE_CAPSULE | Freq: Every day | ORAL | 0 refills | Status: DC

## 2021-12-04 NOTE — Patient Instructions (Signed)
Falta de ar, adultos °Shortness of Breath, Adult °Falta de ar significa que você tem problemas para respirar. A falta de ar pode ser um sinal de algum problema médico. °Siga estas instruções em casa: °Poluição °Não fume nem use produtos que contenham nicotina ou tabaco. Se precisar de ajuda para parar de fumar, fale com seu médico. °Evite coisas que podem dificultar a respiração, tais como: °Fumaça de todos os tipos. Isso inclui fumaça de fogueiras ou incêndios florestais. Não fume nem permita que qualquer outra pessoa fume em sua casa. °Mofo. °Poeira. °Poluição do ar. °Cheiros de substâncias químicas. °Substâncias que podem lhe causem reação alérgica (alérgenos), caso você tenha alguma alergia. °Mantenha sua casa limpa. Use produtos que ajudem a remover o mofo e a poeira. °Instruções gerais °Fique atento a eventuais alterações em seus sintomas. °Tome medicamentos vendidos com ou sem prescrição somente de acordo com as indicações do seu médico. Isso inclui oxigenoterapia e outros medicamento por via inalatória. °Repouse conforme necessário. °Retorne às suas atividades normais quando seu médico disser que é seguro. °Compareça a todas as consultas de acompanhamento. °Entre em contato com um médico se: °Seu quadro clínico não melhorar quando esperado. °Tiver dificuldades para realizar atividades normais mesmo depois de repousar. °Surgirem novos sintomas. °Não conseguir subir escadas. °Não conseguir fazer exercícios como costumava fazer. °Busque ajuda imediatamente se: °A falta de ar piorar. °Tiver dificuldade para respirar quando em repouso. °Sentir tonturas ou desmaiar. °Ocorrer tosse não aliviada por medicamentos. °Tossir sangue. °Sentir dor ao respirar. °Sentir dor no peito, braços, ombros ou barriga (abdome). °Tiver febre. °Esses sintomas podem ser uma emergência. Obtenha ajuda imediatamente. Ligue para 911. °Não espere para ver se os sintomas desaparecem. °Não dirija por conta própria até o  hospital. °Resumo °Falta de ar é quando você tem dificuldade de respirar ar suficiente. Ela pode ser um sinal de um problema médico. °Evite coisas que atrapalhem a respiração, como fumar, poluição, mofo e poeira. °Fique atento a eventuais alterações em seus sintomas. Entre em contato com seu médico se você não melhorar ou se piorar. °Estas informações não se destinam a substituir as recomendações de seu médico. Não deixe de discutir quaisquer dúvidas com seu médico. °Document Revised: 08/06/2021 Document Reviewed: 08/06/2021 °Elsevier Patient Education © 2022 [employer] ° °

## 2021-12-04 NOTE — Progress Notes (Signed)
Subjective:    Patient ID: Brandi [name], female    DOB: 1956/02/13, 65 y.o.   MRN: [id number]  HPI  Pt presents to the clinic today with c/o shortness of breath with exertion.  She reports this started 2 weeks ago.  She is not short of breath at rest but has shortness of breath with exertion or lifting or when the weather is cold outside.  She has had some postnasal drip but denies cough.  She denies headache, runny nose, nasal congestion, ear pain, sore throat.  She reports some chest pressure but denies chest pain.  She does have some reflux that seems worse lately, triggered by everything that she eats and is not sure if this is a contributing factor.  She takes Famotidine twice daily with minimal relief of symptoms.  She denies nausea, vomiting, bloating, diarrhea or constipation.  She has a history of COPD, managed with Brezetri and Albuterol.  She wears oxygen at night.  She has a follow-up with pulmonology in January.  Review of Systems     Past Medical History:  Diagnosis Date   Hyperlipidemia     Current Outpatient Medications  Medication Sig Dispense Refill   albuterol (PROVENTIL) (2.5 MG/3ML) 0.083% nebulizer solution Take 3 mLs (2.5 mg total) by nebulization every 6 (six) hours as needed for wheezing or shortness of breath. 75 mL 12   albuterol (VENTOLIN HFA) 108 (90 Base) MCG/ACT inhaler Inhale 2 puffs into the lungs every 4 (four) hours as needed for wheezing or shortness of breath. 8 g 3   alendronate (FOSAMAX) 70 MG tablet TAKE ONE TABLET BY MOUTH EACH WEEK, ON AN EMPTY STOMACH BEFORE BREAKFAST WITH 8oz OF WATER AND REMAIN UPRIGHT FOR :30 4 tablet 11   alum & mag hydroxide-simeth (MAALOX/MYLANTA) 200-200-20 MG/5ML suspension Take 30 mLs by mouth every 4 (four) hours as needed for indigestion or heartburn. 355 mL 0   aspirin EC 81 MG tablet Take 1 tablet (81 mg total) by mouth daily. 90 tablet 3   BREZTRI AEROSPHERE 160-9-4.8 MCG/ACT AERO Inhale 2 puffs into the lungs in the  morning and at bedtime. 10.7 g 3   Budeson-Glycopyrrol-Formoterol (BREZTRI AEROSPHERE) 160-9-4.8 MCG/ACT AERO Inhale 160 mcg into the lungs daily. 5.9 g 0   busPIRone (BUSPAR) 5 MG tablet TAKE 1 TABLET BY MOUTH TWICE DAILY AS NEEDED FOR ANXIETY 60 tablet 2   Calcium Carbonate-Vit D-Min (CALCIUM 1200) 1200-1000 MG-UNIT CHEW Chew 1,200 mg by mouth daily. 30 each 11   Cholecalciferol (VITAMIN D3) 125 MCG (5000 UT) TABS Take by mouth. Vitamin D3 5,000 IU daily for 12 weeks then reduce to OTC Vitamin D3 2,000 IU daily for maintenance     ezetimibe (ZETIA) 10 MG tablet Take 1 tablet (10 mg total) by mouth daily. 90 tablet 3   famotidine (PEPCID) 20 MG tablet Take 1 tablet (20 mg total) by mouth 2 (two) times daily. 180 tablet 3   feeding supplement (ENSURE ENLIVE / ENSURE PLUS) LIQD Take 237 mLs by mouth 2 (two) times daily between meals. 237 mL 12   Multiple Vitamin (MULTIVITAMIN WITH MINERALS) TABS tablet Take 1 tablet by mouth daily.     OXYGEN Inhale into the lungs at bedtime as needed.     rosuvastatin (CRESTOR) 5 MG tablet Take 1 tablet (5 mg total) by mouth daily. 90 tablet 3   sodium chloride (OCEAN) 0.65 % SOLN nasal spray Place 1 spray into both nostrils as needed for congestion.     Specialty  Vitamins Products (HAIR NOURISHING SUPPLEMENT PO) Take 1 tablet by mouth daily.     triamcinolone cream (KENALOG) 0.5 % Apply 1 application topically 2 (two) times daily. To affected areas, for up to 2 weeks. 30 g 0   No current facility-administered medications for this visit.    No Known Allergies  Family History  Problem Relation Age of Onset   Breast cancer Mother 88   Alzheimer's disease Mother     Social History   Socioeconomic History   Marital status: Married    Spouse name: Not on file   Number of children: Not on file   Years of education: Not on file   Highest education level: Not on file  Occupational History   Not on file  Tobacco Use   Smoking status: Former    Packs/day:  0.75    Years: 41.00    Pack years: 30.75    Types: E-cigarettes, Cigarettes    Quit date: 12/08/2015    Years since quitting: 5.9   Smokeless tobacco: Former  Building services engineer Use: Never used  Substance and Sexual Activity   Alcohol use: No   Drug use: No   Sexual activity: Yes    Birth control/protection: Post-menopausal  Other Topics Concern   Not on file  Social History Narrative   Not on file   Social Determinants of Health   Financial Resource Strain: Medium Risk   Difficulty of Paying Living Expenses: Somewhat hard  Food Insecurity: No Food Insecurity   Worried About Programme researcher, broadcasting/film/video in the Last Year: Never true   Ran Out of Food in the Last Year: Never true  Transportation Needs: No Transportation Needs   Lack of Transportation (Medical): No   Lack of Transportation (Non-Medical): No  Physical Activity: Inactive   Days of Exercise per Week: 0 days   Minutes of Exercise per Session: 0 min  Stress: No Stress Concern Present   Feeling of Stress : Only a little  Social Connections: Not on file  Intimate Partner Violence: Not on file     Constitutional: Denies fever, malaise, fatigue, headache or abrupt weight changes.  HEENT: Pt reports post nasal drip. Denies eye pain, eye redness, ear pain, ringing in the ears, wax buildup, runny nose, nasal congestion, bloody nose, or sore throat. Respiratory: Pt reports shortness of breath with exertion. Denies difficulty breathing, cough or sputum production.   Cardiovascular: Pt reports chest tightness. Denies chest pain, chest tightness, palpitations or swelling in the hands or feet.  Gastrointestinal: Pt reports reflux. Denies abdominal pain, bloating, constipation, diarrhea or blood in the stool.   No other specific complaints in a complete review of systems (except as listed in HPI above).  Objective:   Physical Exam   BP (!) 103/57 (BP Location: Right Arm, Patient Position: Sitting, Cuff Size: Normal)    Pulse 93     Temp 97.8 F (36.6 C) (Temporal)    Resp 18    Ht 5\' 4"  (1.626 m)    Wt 124 lb 9.6 oz (56.5 kg)    SpO2 95%    BMI 21.39 kg/m   Wt Readings from Last 3 Encounters:  11/17/21 127 lb (57.6 kg)  11/17/21 127 lb 9.6 oz (57.9 kg)  08/12/21 122 lb 12.8 oz (55.7 kg)    General: Appears her stated age, well developed, well nourished in NAD. Skin: Warm, dry and intact.  HEENT: Head: normal shape and size; Eyes: sclera white and EOMs intact;  Neck: No adenopathy noted. Cardiovascular: Normal rate and rhythm. S1,S2 noted.  No murmur, rubs or gallops noted. Pulmonary/Chest: Normal effort and clear but diminished breath sounds. No respiratory distress. No wheezes, rales or ronchi noted.  Abdomen: Soft and nontender. Normal bowel sounds.  Musculoskeletal: No difficulty with gait.  Neurological: Alert and oriented.  BMET    Component Value Date/Time   NA 140 11/17/2021 1239   K 4.2 11/17/2021 1239   CL 102 11/17/2021 1239   CO2 24 11/17/2021 1239   GLUCOSE 74 11/17/2021 1239   GLUCOSE 90 01/20/2021 0801   BUN 12 11/17/2021 1239   CREATININE 0.87 11/17/2021 1239   CREATININE 0.58 01/20/2021 0801   CALCIUM 9.6 11/17/2021 1239   GFRNONAA 97 01/20/2021 0801   GFRAA 112 01/20/2021 0801    Lipid Panel     Component Value Date/Time   CHOL 188 01/20/2021 0801   CHOL 145 04/01/2020 0848   TRIG 73 01/20/2021 0801   HDL 61 01/20/2021 0801   HDL 55 04/01/2020 0848   CHOLHDL 3.1 01/20/2021 0801   LDLCALC 111 (H) 01/20/2021 0801    CBC    Component Value Date/Time   WBC 6.3 01/20/2021 0801   RBC 3.85 01/20/2021 0801   HGB 12.1 01/20/2021 0801   HGB 14.1 11/10/2018 1043   HCT 36.2 01/20/2021 0801   HCT 40.3 11/10/2018 1043   PLT 475 (H) 01/20/2021 0801   PLT 287 11/10/2018 1043   MCV 94.0 01/20/2021 0801   MCV 92 11/10/2018 1043   MCH 31.4 01/20/2021 0801   MCHC 33.4 01/20/2021 0801   RDW 12.4 01/20/2021 0801   RDW 11.8 (L) 11/10/2018 1043   LYMPHSABS 2,262 01/20/2021 0801    MONOABS 0.3 12/20/2020 0437   EOSABS 189 01/20/2021 0801   BASOSABS 101 01/20/2021 0801    Hgb A1C Lab Results  Component Value Date   HGBA1C 5.8 (H) 01/20/2021           Assessment & Plan:   Shortness of Breath with Exertion, COPD, GERD:  I am concerned that her breathing problems are worse secondary to uncontrolled GERD She is demanding an antibiotic but there is no indication that she needs an antibiotic at this time Walking sat down to 86% on room air, she did not bring her oxygen with her-she likely needs oxygen with exertion and not just at bedtime Continue inhalers as prescribed by pulmonology She does not smoke Chest x-ray today Rx for Omeprazole 20 mg daily at bedtime in addition to Famotidine 10 mg 2 times daily  We will follow-up after imaging with further recommendation and treatment plan, follow-up with pulmonology as previously scheduled  Nicki Reaper, NP This visit occurred during the SARS-CoV-2 public health emergency.  Safety protocols were in place, including screening questions prior to the visit, additional usage of staff PPE, and extensive cleaning of exam room while observing appropriate contact time as indicated for disinfecting solutions.

## 2021-12-06 DIAGNOSIS — E785 Hyperlipidemia, unspecified: Secondary | ICD-10-CM | POA: Diagnosis not present

## 2021-12-06 DIAGNOSIS — J432 Centrilobular emphysema: Secondary | ICD-10-CM | POA: Diagnosis not present

## 2021-12-06 DIAGNOSIS — I700 Atherosclerosis of aorta: Secondary | ICD-10-CM | POA: Diagnosis not present

## 2021-12-09 ENCOUNTER — Telehealth: Payer: Self-pay | Admitting: Medical

## 2021-12-09 NOTE — Telephone Encounter (Signed)
Brandi Ninfa Meeker, PA-C  12/09/2021  3:22 PM EST     Heart monitor showed normal rhythm with minimum heart rate of 59bpm. Overall very reassuring, heart rate is not dropping too low

## 2021-12-09 NOTE — Telephone Encounter (Signed)
I spoke with the patient regarding her monitor results. She voices understanding of these.  She proceeded to tell me that her SOB is getting progressively worse. She is scheduled to [facility] on 12/11/21 for re-evaluation of her O2 for Medicare. She reports her oxygen readings can be normal, but she still feels SOB. She feels SOB just waking up in the morning.  She was seen by her PCP last week who started her on Prilosec in addition to pepcid as they felt acid reflux could be causing some symptoms of SOB.   The patient advised her breathing is worse "since the cold weather started." She advised she feels like she needs help and just can't catch her breath.  I advised the patient that from a cardiac perspective her heart monitor was benign and her echo done in 08/2021 showed no major abnormalities (some diastolic dysfunction).   I explained to the patient that the only other thing we could look at would be the blood flow to her heart, but I will need to review with Cadence, PA if she feels further cardiac testing is appropriate.  The patient is on intermittent O2 by nasal cannula. She advised she had this on 4-5 L today and her sats were 83-86 % while talking on the phone. HR- 44.  I have advised the patient that she needs to follow up with Gonzalez's office as scheduled on 1/5. She is also advised if her breathing issues worsen in any way acutely, she should call 911 to have them evaluate her and take her to the ER if needed.  Otherwise, she is aware someone from our office will call her back once we can review with Cadence if any further cardiac testing is needed.  The patient is scheduled to follow up with Dr. Mariah Milling on 01/05/22.  The patient voices understanding of the above and is agreeable.

## 2021-12-11 ENCOUNTER — Encounter: Payer: Self-pay | Admitting: Pulmonary Disease

## 2021-12-11 ENCOUNTER — Ambulatory Visit (INDEPENDENT_AMBULATORY_CARE_PROVIDER_SITE_OTHER): Payer: No Typology Code available for payment source | Admitting: Pulmonary Disease

## 2021-12-11 ENCOUNTER — Other Ambulatory Visit
Admission: RE | Admit: 2021-12-11 | Discharge: 2021-12-11 | Disposition: A | Discharge status: Home / Self Care | Payer: No Typology Code available for payment source | Source: Ambulatory Visit | Attending: Pulmonary Disease | Admitting: Pulmonary Disease

## 2021-12-11 ENCOUNTER — Other Ambulatory Visit: Payer: Self-pay

## 2021-12-11 VITALS — BP 110/62 | HR 100 | Temp 96.9°F | Ht 64.0 in | Wt 123.2 lb

## 2021-12-11 DIAGNOSIS — I2584 Coronary atherosclerosis due to calcified coronary lesion: Secondary | ICD-10-CM

## 2021-12-11 DIAGNOSIS — J432 Centrilobular emphysema: Secondary | ICD-10-CM

## 2021-12-11 DIAGNOSIS — R0602 Shortness of breath: Secondary | ICD-10-CM

## 2021-12-11 DIAGNOSIS — J441 Chronic obstructive pulmonary disease with (acute) exacerbation: Secondary | ICD-10-CM

## 2021-12-11 DIAGNOSIS — I2510 Atherosclerotic heart disease of native coronary artery without angina pectoris: Secondary | ICD-10-CM

## 2021-12-11 DIAGNOSIS — J449 Chronic obstructive pulmonary disease, unspecified: Secondary | ICD-10-CM

## 2021-12-11 DIAGNOSIS — K219 Gastro-esophageal reflux disease without esophagitis: Secondary | ICD-10-CM

## 2021-12-11 DIAGNOSIS — J439 Emphysema, unspecified: Secondary | ICD-10-CM

## 2021-12-11 LAB — D-DIMER, QUANTITATIVE: D-Dimer, Quant: <0.27 ug/mL-FEU (ref 0.00–0.50)

## 2021-12-11 MED ORDER — METHYLPREDNISOLONE 4 MG PO TBPK: ORAL_TABLET | ORAL | 0 refills | Status: DC

## 2021-12-11 MED ORDER — ALBUTEROL SULFATE HFA 108 (90 BASE) MCG/ACT IN AERS: 2.0000 | INHALATION_SPRAY | RESPIRATORY_TRACT | 3 refills | Status: DC | PRN

## 2021-12-11 NOTE — Patient Instructions (Addendum)
We are checking an oxygen level at nighttime.  The day that you do the test do not wear your oxygen as we need to know what your oxygen level is doing.  This is to qualify you for the nighttime oxygen.  We have checked your oxygen levels when you walk once on 12 December and once today and you do not meet qualification for a portable oxygen source.  We are giving you Medrol taper pack this should hopefully help with your sinus issues.  Make sure you also keep your nasal passages moist with either nasal saline or nasal saline gel.  You can get these over-the-counter.  You have a follow-up appointment on 15 February with me, keep that appointment.  Call sooner if you have any new issues.  Keep your appointment with the cardiologist that you have upcoming.

## 2021-12-11 NOTE — Telephone Encounter (Signed)
Called patient and informed her of Brandi [name]'s recommendation. Patient verbalized understanding and agreed with plan.

## 2021-12-11 NOTE — Progress Notes (Signed)
Subjective:    Patient ID: Brandi [name], female    DOB: 09/25/1956, 66 y.o.   MRN: [id number] Chief Complaint  Patient presents with   Acute Visit    Increased nasal congestion, cough productive of white sputum.  Symptoms worse over the last 2 to 3 days.    HPI Patient is a 66 year old former smoker (28 PY) who presents for an acute visit.  She was last seen here 17 November 2021. Recall that the patient had COVID-19 in January 2022 she was admitted to [facility] between 16 December 2020 through 23 December 2020.  She had acute respiratory failure with hypoxia and was discharged home on 2 L/min nasal cannula O2 24/7.  She was subsequently followed by [facility] and was weaned off of O2 during the day.  She however remained on oxygen nocturnally.  At her prior visit on December 12 she was referred to cardiology due to tachypalpitations and also it was recommended that she continue using her oxygen nocturnally.  Over the last few days however she has noted increased cough productive of clear sputum, no fevers, chills or sweats.  She has had increased nasal congestion and clear nasal discharge.  Has also noticed wheezing and some increased shortness of breath over her baseline.  No orthopnea.  She does have occasional chest discomfort but no chest pain per se.  She does have gastroesophageal reflux symptoms but these are fairly well-controlled with omeprazole and Pepcid.  DATA 04/09/2021 LDCT: Moderate to severe paraseptal and centrilobular emphysema.  Left upper lobe nodule measuring 2 mm (decreased from prior), compression fractures T7 and T9 noted previously and unchanged.  Coronary artery calcifications. 07/04/2021 alpha-1 antitrypsin: Phenotype MM, level 140 mg/dL (normal) 40/98/1191 overnight oximetry: Baseline O2 sat 90% during sleep with desaturations as low as 83% during the night desaturation events lasted for over an hour. 08/14/2021 PFTs: FEV1 1.13 L or 46% predicted, FVC 2.42 L or 76%  predicted, FEV1/FVC 47%, there is a mild response to bronchodilator with a 10% net change.  There is hyperinflation with TLC at 128% and air trapping with RV at 188%.  Diffusion capacity is moderately to severely reduced by Kco.  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  Review of Systems A 10 point review of systems was performed and it is as noted above otherwise negative.  Patient Active Problem List   Diagnosis Date Noted   Pre-diabetes 04/08/2021   Vitamin D deficiency 04/08/2021   COVID-19 vaccination declined 12/04/2020   Centrilobular emphysema (HCC) 04/08/2020   Nodule of upper lobe of left lung 04/08/2020   Aortic atherosclerosis (HCC) 05/21/2019   Coronary artery calcification seen on CT scan 05/21/2019   Former smoker 05/21/2019   Hyperlipidemia 05/21/2019   Social History   Tobacco Use   Smoking status: Former    Packs/day: 0.75    Years: 41.00    Pack years: 30.75    Types: E-cigarettes, Cigarettes    Quit date: 12/08/2015    Years since quitting: 6.0   Smokeless tobacco: Former  Substance Use Topics   Alcohol use: No   No Known Allergies  Current Meds  Medication Sig   albuterol (PROVENTIL) (2.5 MG/3ML) 0.083% nebulizer solution Take 3 mLs (2.5 mg total) by nebulization every 6 (six) hours as needed for wheezing or shortness of breath.   albuterol (VENTOLIN HFA) 108 (90 Base) MCG/ACT inhaler Inhale 2 puffs into the lungs every 4 (four) hours as needed for wheezing or shortness of  breath.   alendronate (FOSAMAX) 70 MG tablet TAKE ONE TABLET BY MOUTH EACH WEEK, ON AN EMPTY STOMACH BEFORE BREAKFAST WITH 8oz OF WATER AND REMAIN UPRIGHT FOR :30   alum & mag hydroxide-simeth (MAALOX/MYLANTA) 200-200-20 MG/5ML suspension Take 30 mLs by mouth every 4 (four) hours as needed for indigestion or heartburn.   aspirin EC 81 MG tablet Take 1 tablet (81 mg total) by mouth daily.   BREZTRI AEROSPHERE 160-9-4.8 MCG/ACT AERO Inhale 2 puffs into the lungs in the morning  and at bedtime.   Budeson-Glycopyrrol-Formoterol (BREZTRI AEROSPHERE) 160-9-4.8 MCG/ACT AERO Inhale 160 mcg into the lungs daily.   busPIRone (BUSPAR) 5 MG tablet TAKE 1 TABLET BY MOUTH TWICE DAILY AS NEEDED FOR ANXIETY   Calcium Carbonate-Vit D-Min (CALCIUM 1200) 1200-1000 MG-UNIT CHEW Chew 1,200 mg by mouth daily.   Cholecalciferol (VITAMIN D3) 125 MCG (5000 UT) TABS Take by mouth. Vitamin D3 5,000 IU daily for 12 weeks then reduce to OTC Vitamin D3 2,000 IU daily for maintenance   ezetimibe (ZETIA) 10 MG tablet Take 1 tablet (10 mg total) by mouth daily.   famotidine (PEPCID) 20 MG tablet Take 1 tablet (20 mg total) by mouth 2 (two) times daily.   feeding supplement (ENSURE ENLIVE / ENSURE PLUS) LIQD Take 237 mLs by mouth 2 (two) times daily between meals.   Multiple Vitamin (MULTIVITAMIN WITH MINERALS) TABS tablet Take 1 tablet by mouth daily.   omeprazole (PRILOSEC) 20 MG capsule Take 1 capsule (20 mg total) by mouth daily.   OXYGEN Inhale into the lungs at bedtime as needed.   rosuvastatin (CRESTOR) 5 MG tablet Take 1 tablet (5 mg total) by mouth daily.   sodium chloride (OCEAN) 0.65 % SOLN nasal spray Place 1 spray into both nostrils as needed for congestion.   Specialty Vitamins Products (HAIR NOURISHING SUPPLEMENT PO) Take 1 tablet by mouth daily.   triamcinolone cream (KENALOG) 0.5 % Apply 1 application topically 2 (two) times daily. To affected areas, for up to 2 weeks.   Immunization History  Administered Date(s) Administered   [employer] Vaccination 05/26/2021, 06/24/2021       Objective:   Physical Exam BP 110/62 (BP Location: Left Arm, Patient Position: Sitting, Cuff Size: Normal)   Pulse 100   Temp (!) 96.9 F (36.1 C) (Oral)   Ht 5\' 4"  (1.626 m)   Wt 123 lb 3.2 oz (55.9 kg)   SpO2 95%   BMI 21.15 kg/m   SpO2: 95 % O2 Device: None (Room air)  GENERAL: Well-developed, well-nourished woman, no acute distress.  No conversational dyspnea. HEAD:  Normocephalic, atraumatic.  EYES: Pupils equal, round, reactive to light.  No scleral icterus.  MOUTH: Nose/mouth/throat not examined due to masking requirements for COVID 19. NECK: Supple. No thyromegaly. Trachea midline. No JVD.  No adenopathy. PULMONARY: Good air entry bilaterally.  Few rhonchi noted, no wheezes. CARDIOVASCULAR: S1 and S2. Regular rate and rhythm.  No rubs, murmurs or gallops heard. ABDOMEN: Benign. MUSCULOSKELETAL: No joint deformity, no clubbing, no edema.  NEUROLOGIC: No focal deficit, no gait disturbance, speech is fluent. SKIN: Intact,warm,dry. PSYCH: Mood and behavior normal.       Assessment & Plan:     ICD-10-CM   1. COPD with acute exacerbation (HCC)  J44.1 CANCELED: Pulse oximetry, overnight   Continue Breztri 2 puffs twice a day Continue as needed albuterol Medrol Dosepak No antibiotics indicated    2. Nocturnal hypoxemia due to emphysema (HCC)  J43.9    G47.36  She states she is compliant Needs requalification Overnight oximetry ordered    3. SOB (shortness of breath)  R06.02 D-dimer, quantitative    methylPREDNISolone (MEDROL DOSEPAK) 4 MG TBPK tablet   Will check D-dimer Treat mild exacerbation as above    4. Coronary artery calcification  I25.10    I25.84    Has upcoming appointment with cardiology    5. Gastroesophageal reflux disease, unspecified whether esophagitis present  K21.9    Continue omeprazole/Pepcid Antireflux measures     Orders Placed This Encounter  Procedures   D-dimer, quantitative    Standing Status:   Future    Number of Occurrences:   1    Standing Expiration Date:   12/11/2022   Meds ordered this encounter  Medications   methylPREDNISolone (MEDROL DOSEPAK) 4 MG TBPK tablet    Sig: USE AS DIRECTED    Dispense:  21 tablet    Refill:  0   albuterol (VENTOLIN HFA) 108 (90 Base) MCG/ACT inhaler    Sig: Inhale 2 puffs into the lungs every 4 (four) hours as needed for wheezing or shortness of breath.     Dispense:  8 g    Refill:  3   Will obtain a D-dimer.  If D-dimer is elevated we will proceed with CT angio chest to evaluate her worsening shortness of breath however feel that this is secondary to a minor COPD exacerbation.  Patient has already has follow-up established with me on 15 February.  She has been advised to keep that appointment.  Call sooner if she has any major issues.  Gailen Shelter, MD Advanced Bronchoscopy PCCM [facility]    *This note was dictated using voice recognition software/Dragon.  Despite best efforts to proofread, errors can occur which can change the meaning. Any transcriptional errors that result from this process are unintentional and may not be fully corrected at the time of dictation.

## 2021-12-18 ENCOUNTER — Ambulatory Visit
Admission: RE | Admit: 2021-12-18 | Discharge: 2021-12-18 | Disposition: A | Discharge status: Home / Self Care | Payer: No Typology Code available for payment source | Source: Ambulatory Visit | Attending: Obstetrics and Gynecology | Admitting: Obstetrics and Gynecology

## 2021-12-18 ENCOUNTER — Other Ambulatory Visit: Payer: Self-pay

## 2021-12-18 DIAGNOSIS — Z1231 Encounter for screening mammogram for malignant neoplasm of breast: Secondary | ICD-10-CM | POA: Diagnosis not present

## 2021-12-19 ENCOUNTER — Ambulatory Visit (INDEPENDENT_AMBULATORY_CARE_PROVIDER_SITE_OTHER): Payer: Medicaid Other | Admitting: Pharmacist

## 2021-12-19 DIAGNOSIS — K219 Gastro-esophageal reflux disease without esophagitis: Secondary | ICD-10-CM

## 2021-12-19 DIAGNOSIS — J432 Centrilobular emphysema: Secondary | ICD-10-CM

## 2021-12-19 NOTE — Patient Instructions (Signed)
Visit Information  Thank you for taking time to visit with me today. Please don't hesitate to contact me if I can be of assistance to you before our next scheduled telephone appointment.  Following are the goals we discussed today:   Goals Addressed             This Visit's Progress    Pharmacy Goals       Our goal bad cholesterol, or LDL, is less than 70. This is why it is important to continue taking your rosuvastatin and ezetimibe.  Feel free to call me with any questions or concerns. I look forward to our next call!  Estelle Grumbles, PharmD, P[place]y [place], CPP Clinical Pharmacist [facility] [phone]         Our next appointment is by telephone on 02/04/2022 at 10:30 am  Please call the care guide team at [phone] if you need to cancel or reschedule your appointment.    The patient verbalized understanding of instructions, educational materials, and care plan provided today and declined offer to receive copy of patient instructions, educational materials, and care plan.

## 2021-12-19 NOTE — Chronic Care Management (AMB) (Signed)
Chronic Care Management CCM Pharmacy Note  12/19/2021 Name:  Brandi [name] MRN:  [id number] DOB:  15-Aug-1956   Subjective: Brandi [name] is an 66 y.o. year old female who is a primary patient of [name], DO.  The CCM team was consulted for assistance with disease management and care coordination needs.    Engaged with patient by telephone for follow up visit for pharmacy case management and/or care coordination services.   Objective:  Medications Reviewed Today     Reviewed by [name], RPH-CPP (Pharmacist) on 12/19/21 at 1021  Med List Status: <None>   Medication Order Taking? Sig Documenting Provider Last Dose Status Informant  albuterol (PROVENTIL) (2.5 MG/3ML) 0.083% nebulizer solution [id number]  Take 3 mLs (2.5 mg total) by nebulization every 6 (six) hours as needed for wheezing or shortness of breath. [name], MD  Active   albuterol (VENTOLIN HFA) 108 (90 Base) MCG/ACT inhaler [id number] Yes Inhale 2 puffs into the lungs every 4 (four) hours as needed for wheezing or shortness of breath. [name], MD Taking Active   alendronate (FOSAMAX) 70 MG tablet [id number]  TAKE ONE TABLET BY MOUTH EACH WEEK, ON AN EMPTY STOMACH BEFORE BREAKFAST WITH 8oz OF WATER AND REMAIN UPRIGHT FOR :30 [name], [name], MD  Active   alum & mag hydroxide-simeth (MAALOX/MYLANTA) 200-200-20 MG/5ML suspension [id number]  Take 30 mLs by mouth every 4 (four) hours as needed for indigestion or heartburn. [name], DO  Active   aspirin EC 81 MG tablet [id number]  Take 1 tablet (81 mg total) by mouth daily. [name], MD  Active Self  BREZTRI AEROSPHERE 160-9-4.8 MCG/ACT [name] [id number] Yes Inhale 2 puffs into the lungs in the morning and at bedtime. [name], DO Taking Active   Budeson-Glycopyrrol-Formoterol (BREZTRI AEROSPHERE) 160-9-4.8 MCG/ACT AERO [id number]  Inhale 160 mcg into the lungs daily. [name], MD  Active    busPIRone (BUSPAR) 5 MG tablet [id number]  TAKE 1 TABLET BY MOUTH TWICE DAILY AS NEEDED FOR ANXIETY [name], [name], DO  Active   Calcium Carbonate-Vit D-Min (CALCIUM 1200) 1200-1000 MG-UNIT CHEW [id number]  Chew 1,200 mg by mouth daily. [name], MD  Active   Cholecalciferol (VITAMIN D3) 125 MCG (5000 UT) TABS [id number]  Take by mouth. Vitamin D3 5,000 IU daily for 12 weeks then reduce to OTC Vitamin D3 2,000 IU daily for maintenance [provider]  Active   ezetimibe (ZETIA) 10 MG tablet [id number] Yes Take 1 tablet (10 mg total) by mouth daily. [name], MD Taking Active   famotidine (PEPCID) 20 MG tablet [id number] No Take 1 tablet (20 mg total) by mouth 2 (two) times daily.  Patient not taking: Reported on 12/19/2021   [name], DO Not Taking Active   feeding supplement (ENSURE ENLIVE / ENSURE PLUS) LIQD [id number]  Take 237 mLs by mouth 2 (two) times daily between meals. [name], DO  Active   Multiple Vitamin (MULTIVITAMIN WITH MINERALS) TABS tablet [id number]  Take 1 tablet by mouth daily. [name], DO  Active   omeprazole (PRILOSEC) 20 MG capsule [id number] Yes Take 1 capsule (20 mg total) by mouth daily. Lorre Munroe, NP Taking Active   OXYGEN [id number]  Inhale into the lungs at bedtime as needed. [provider]  Active   rosuvastatin (CRESTOR) 5 MG tablet [id number] Yes Take 1 tablet (5 mg total) by mouth daily. [name], DO Taking Active  sodium chloride (OCEAN) 0.65 % SOLN nasal spray [id number] Yes Place 1 spray into both nostrils as needed for congestion. [provider] Taking Active   Specialty Vitamins Products (HAIR NOURISHING SUPPLEMENT PO) [id number]  Take 1 tablet by mouth daily. [provider]  Active   triamcinolone cream (KENALOG) 0.5 % [id number]  Apply 1 application topically 2 (two) times daily. To affected areas, for up to 2 weeks. Smitty CordsKaramalegos, Alexander J, DO   Active             Pertinent Labs:   Lab Results  Component Value Date   CHOL 188 01/20/2021   HDL 61 01/20/2021   LDLCALC 111 (H) 01/20/2021   TRIG 73 01/20/2021   CHOLHDL 3.1 01/20/2021   Lab Results  Component Value Date   CREATININE 0.87 11/17/2021   BUN 12 11/17/2021   NA 140 11/17/2021   K 4.2 11/17/2021   CL 102 11/17/2021   CO2 24 11/17/2021    SDOH:  (Social Determinants of Health) assessments and interventions performed:    CCM Care Plan  Review of patient past medical history, allergies, medications, health status, including review of consultants reports, laboratory and other test data, was performed as part of comprehensive evaluation and provision of chronic care management services.   Care Plan : PharmD - Medication Assistance/Adherence  Updates made by Manuela Neptuneelles, Alisha Bacus A, RPH-CPP since 12/19/2021 12:00 AM     Problem: Disease Progression      Long-Range Goal: Disease Progression Prevented or Minimized   Start Date: 04/30/2021  Expected End Date: 07/29/2021  Recent Progress: On track  Priority: High  Note:   Current Barriers:  Unable to independently afford treatment regimen Reports cost of Breztri inhaler unaffordable through [facility] [employer] Medicare health plan [facility] enrolled in [facility] 2022 COPD Inhaler Support Program  Pharmacist Clinical Goal(s):  Over the next 90 days, patient will verbalize ability to afford treatment regimen through collaboration with PharmD and provider.   Interventions: 1:1 collaboration with Smitty CordsKaramalegos, Alexander J, DO regarding development and update of comprehensive plan of care as evidenced by provider attestation and co-signature Inter-disciplinary care team collaboration (see longitudinal plan of care) Perform chart review [facility]. Provider advised patient: Ordered overnight pulse oximetry Rx for Medrol taper pack given Use nasal saline or nasal  saline gel to keep nasal passages moist Keep upcoming appointment with Cardiologist Office Visit with Nicki Reaperegina Baity, NP on 12/29 for c/o shortness of breath with exertion. Provider advised: Continue inhalers as prescribed by pulmonology Chest x-ray completed Rx for Omeprazole 20 mg daily at bedtime in addition to Famotidine 10 mg 2 times daily Today patient reports breathing has improved some since completed steroid course Reports she is expecting to receive device for overnight oxygen test next week. Confirms will complete test a directed by Pulmonologist Reports acid symptoms currently controlled by taking omeprazole 20 mg daily  COPD: Current treatment: Breztri inhaler - 2 puffs in morning and at bedtime Albuterol inhaler - 1 puffs every 4 hours as needed Overnight oxygen Using nasal saline as directed Encourage patient to continue using Breztri inhaler twice daily (and rinse out mouth after use) and use albuterol inhaler as needed as directed Reports has ~3 Breztri inhalers remaining  Medication Assistance: Reports now has new health plan in 2023, [facility].  Based on patient report, meets income requirement for Extra Help through [employer], but is concerned that would not qualify based on other resources (money  received from spouse's life insurance) Based on reported income, patient would qualify for patient assistance for Breztri inhaler from [employer], but program would require patient to have completed Extra Help application Have counseled patient on options for completing Extra Help application online or paper application from [employer] office. Offer to assist patient with Extra Help application over the phone Patient reports that she is not interested in working on patient assistance or extra help applications at this time. Instead prefers to pay tier 3 copayment, which per [facility] website, is $45/month  Hyperlipidemia: Current treatment: Ezetimibe 10  mg daily Rosuvastatin 5 mg daily Reports tolerating this dose well Previous therapies tried: rosuvastatin 20 mg daily (numbness/tingling)   Medication Adherence: Patient uses weekly pillbox as adherence aid   Patient Goals/Self-Care Activities Over the next 90 days, patient will:  - take medications as prescribed - collaborate with provider on medication access solutions - attend medical appointments as scheduled   Next appointment with Cardiology on 1/30 Next appointment with Pulmonology on 2/15  Follow Up Plan: Telephone follow up appointment with care management team member scheduled for: 02/04/2022 at 10:30 am       Estelle Grumbles, PharmD, Patsy [place], CPP Clinical Pharmacist [facility] [phone]

## 2021-12-21 NOTE — Progress Notes (Signed)
Cardiology Office Note  Date:  12/22/2021   ID:  Mystery Brandi [name], DOB [birth year], MRN [id number]  PCP:  Olin Hauser, DO   Chief Complaint  Patient presents with   5 week follow up     Patient c/o shortness of breath with little to no activity. Medications reviewed by the patient verbally.     HPI:  [name] is a 66 year old woman with past medical history of Former smoker, 31 years, quit  Aortic atherosclerosis, moderate multivessel coronary calcification on CT Who presents for follow-up of her atherosclerosis, CAD  LOV 03/2020 Lost husband from covid 12/2020 She had covid in jan 2022  Having chest discomfort, SOB episodes Concerned about blockages  Zio monitor, reviewed Normal sinus rhythm Patient had a min HR of 59 bpm, max HR of 139 bpm, and avg HR of 84 bpm.  1 run of Supraventricular Tachycardia occurred lasting 15 beats with a max rate of 139 bpm (avg 120 bpm). Isolated SVEs were occasional (2.4%, 7076), SVE Couplets were rare (<1.0%, 13), and SVE Triplets were rare (<1.0%, 2).  Isolated VEs were rare (<1.0%), and no VE Couplets or VE Triplets were present.  Patient triggered events (2) not associated with significant arrhythmia, artifact noted  Echo, reviewed  1. Left ventricular ejection fraction, by estimation, is 60 to 65%. The  left ventricle has normal function. The left ventricle has no regional  wall motion abnormalities. Left ventricular diastolic parameters are  consistent with Grade I diastolic  dysfunction (impaired relaxation). The average left ventricular global  longitudinal strain is -18.5 %. The global longitudinal strain is normal.   2. Right ventricular systolic function is normal. The right ventricular  size is normal. There is normal pulmonary artery systolic pressure. The  estimated right ventricular systolic pressure is [id number] mmHg.   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, horses,  cows Neighbor helps with feeding  EKG personally reviewed by myself on todays visit NSR rate 87 bpm, no ST or T changes  Other past medical history reviewed CT scan January 2020 Multivessel coronary artery atherosclerosis noted Aortic atherosclerosis CT scan documenting moderate emphysema, bullae Atherosclerosis is mild in the arch Coronary artery calcification appears moderate   PMH:    Aortic atherosclerosis Coronary calcification Smoker   PSH:    Past Surgical History:  Procedure Laterality Date   APPENDECTOMY     CHOLECYSTECTOMY     SHOULDER ARTHROSCOPY Left 07/02/2015   Procedure: ,left shoulder arthroscopy, decompression and debridement;  Surgeon: Corky Mull, MD;  Location: [facility] ORS;  Service: Orthopedics;  Laterality: Left;   SHOULDER CLOSED REDUCTION Left 04/09/2015   Procedure: CLOSED MANIPULATION SHOULDER;  Surgeon: Hessie Knows, MD;  Location: [facility] ORS;  Service: Orthopedics;  Laterality: Left;    Current Outpatient Medications  Medication Sig Dispense Refill   albuterol (PROVENTIL) (2.5 MG/3ML) 0.083% nebulizer solution Take 3 mLs (2.5 mg total) by nebulization every 6 (six) hours as needed for wheezing or shortness of breath. 75 mL 12   albuterol (VENTOLIN HFA) 108 (90 Base) MCG/ACT inhaler Inhale 2 puffs into the lungs every 4 (four) hours as needed for wheezing or shortness of breath. 8 g 3   alendronate (FOSAMAX) 70 MG tablet TAKE ONE TABLET BY MOUTH EACH WEEK, ON AN EMPTY STOMACH BEFORE BREAKFAST WITH 8oz OF WATER AND REMAIN UPRIGHT FOR :30 4 tablet 11   alum & mag hydroxide-simeth (MAALOX/MYLANTA) 200-200-20 MG/5ML suspension Take 30 mLs by mouth every 4 (  four) hours as needed for indigestion or heartburn. 355 mL 0   aspirin EC 81 MG tablet Take 1 tablet (81 mg total) by mouth daily. 90 tablet 3   BREZTRI AEROSPHERE 160-9-4.8 MCG/ACT AERO Inhale 2 puffs into the lungs in the morning and at bedtime. 10.7 g 3   Budeson-Glycopyrrol-Formoterol (BREZTRI AEROSPHERE)  160-9-4.8 MCG/ACT AERO Inhale 160 mcg into the lungs daily. 5.9 g 0   busPIRone (BUSPAR) 5 MG tablet TAKE 1 TABLET BY MOUTH TWICE DAILY AS NEEDED FOR ANXIETY 60 tablet 2   Calcium Carbonate-Vit D-Min (CALCIUM 1200) 1200-1000 MG-UNIT CHEW Chew 1,200 mg by mouth daily. 30 each 11   Cholecalciferol (VITAMIN D3) 125 MCG (5000 UT) TABS Take by mouth. Vitamin D3 5,000 IU daily for 12 weeks then reduce to OTC Vitamin D3 2,000 IU daily for maintenance     ezetimibe (ZETIA) 10 MG tablet Take 1 tablet (10 mg total) by mouth daily. 90 tablet 3   famotidine (PEPCID) 20 MG tablet Take 1 tablet (20 mg total) by mouth 2 (two) times daily. 180 tablet 3   feeding supplement (ENSURE ENLIVE / ENSURE PLUS) LIQD Take 237 mLs by mouth 2 (two) times daily between meals. 237 mL 12   Multiple Vitamin (MULTIVITAMIN WITH MINERALS) TABS tablet Take 1 tablet by mouth daily.     omeprazole (PRILOSEC) 20 MG capsule Take 1 capsule (20 mg total) by mouth daily. 30 capsule 0   OXYGEN Inhale into the lungs at bedtime as needed.     rosuvastatin (CRESTOR) 5 MG tablet Take 1 tablet (5 mg total) by mouth daily. 90 tablet 3   sodium chloride (OCEAN) 0.65 % SOLN nasal spray Place 1 spray into both nostrils as needed for congestion.     Specialty Vitamins Products (HAIR NOURISHING SUPPLEMENT PO) Take 1 tablet by mouth daily.     triamcinolone cream (KENALOG) 0.5 % Apply 1 application topically 2 (two) times daily. To affected areas, for up to 2 weeks. 30 g 0   No current facility-administered medications for this visit.    Allergies:   Patient has no known allergies.   Social History:  The patient  reports that she quit smoking about 6 years ago. Her smoking use included e-cigarettes and cigarettes. She has a 30.75 pack-year smoking history. She has quit using smokeless tobacco. She reports that she does not drink alcohol and does not use drugs.   Family History:   family history includes Alzheimer's disease in her mother; Breast  cancer (age of onset: 59) in her mother.    Review of Systems: Review of Systems  Constitutional: Negative.   HENT: Negative.    Respiratory: Negative.    Cardiovascular: Negative.   Gastrointestinal: Negative.   Musculoskeletal: Negative.   Neurological: Negative.   Psychiatric/Behavioral:  The patient is nervous/anxious.   All other systems reviewed and are negative.  PHYSICAL EXAM: VS:  BP 102/60 (BP Location: Left Arm, Patient Position: Sitting, Cuff Size: Normal)    Pulse 87    Ht 5\' 4"  (1.626 m)    Wt 123 lb 6 oz (56 kg)    SpO2 99%    BMI 21.18 kg/m  , BMI Body mass index is 21.18 kg/m. Constitutional:  oriented to person, place, and time. No distress.  HENT:  Head: Grossly normal Eyes:  no discharge. No scleral icterus.  Neck: No JVD, no carotid bruits  Cardiovascular: Regular rate and rhythm, no murmurs appreciated Pulmonary/Chest: Clear to auscultation bilaterally, no wheezes or rails  Abdominal: Soft.  no distension.  no tenderness.  Musculoskeletal: Normal range of motion Neurological:  normal muscle tone. Coordination normal. No atrophy Skin: Skin warm and dry Psychiatric: normal affect, pleasant   Recent Labs: 01/20/2021: ALT 15; Hemoglobin 12.1; Platelets 475 11/17/2021: BUN 12; Creatinine, Ser 0.87; Magnesium 2.1; Potassium 4.2; Sodium 140; TSH 0.969    Lipid Panel Lab Results  Component Value Date   CHOL 188 01/20/2021   HDL 61 01/20/2021   LDLCALC 111 (H) 01/20/2021   TRIG 73 01/20/2021      Wt Readings from Last 3 Encounters:  12/22/21 123 lb 6 oz (56 kg)  12/11/21 123 lb 3.2 oz (55.9 kg)  12/04/21 124 lb 9.6 oz (56.5 kg)      ASSESSMENT AND PLAN:  Aortic atherosclerosis (HCC) -  Quit smoking, on crestor and Zetia Goal LDL less than 70  Coronary disease with stable angina Coronary artery calcification seen on CT scan -  Having some chest pain concerning for angina Have ordered Lexiscan Myoview  Former smoker -  Smoked for 31 years,  stopped  3 years ago Work-up as above  Mixed hyperlipidemia - Continue Crestor daily with Zetia  SOB/Emphysema Moderate on CT scan, Diffuse bronchial wall thickening with emphysema,  Albuterol as needed Followed by pulmonary   Total encounter time more than 25 minutes  Greater than 50% was spent in counseling and coordination of care with the patient   Orders Placed This Encounter  Procedures   EKG 12-Lead     Signed, Esmond Plants, M.D., Ph.D. 12/22/2021  [place], [place]

## 2021-12-22 ENCOUNTER — Encounter: Payer: Self-pay | Admitting: Cardiovascular Disease

## 2021-12-22 ENCOUNTER — Other Ambulatory Visit: Payer: Self-pay

## 2021-12-22 ENCOUNTER — Ambulatory Visit (INDEPENDENT_AMBULATORY_CARE_PROVIDER_SITE_OTHER): Payer: No Typology Code available for payment source | Admitting: Cardiovascular Disease

## 2021-12-22 VITALS — BP 102/60 | HR 87 | Ht 64.0 in | Wt 123.4 lb

## 2021-12-22 DIAGNOSIS — E782 Mixed hyperlipidemia: Secondary | ICD-10-CM

## 2021-12-22 DIAGNOSIS — R001 Bradycardia, unspecified: Secondary | ICD-10-CM | POA: Diagnosis not present

## 2021-12-22 DIAGNOSIS — J432 Centrilobular emphysema: Secondary | ICD-10-CM | POA: Diagnosis not present

## 2021-12-22 DIAGNOSIS — Z87891 Personal history of nicotine dependence: Secondary | ICD-10-CM | POA: Diagnosis not present

## 2021-12-22 DIAGNOSIS — I700 Atherosclerosis of aorta: Secondary | ICD-10-CM

## 2021-12-22 DIAGNOSIS — I208 Other forms of angina pectoris: Secondary | ICD-10-CM

## 2021-12-22 DIAGNOSIS — R0602 Shortness of breath: Secondary | ICD-10-CM | POA: Diagnosis not present

## 2021-12-22 NOTE — Patient Instructions (Signed)
Medication Instructions:  No changes  If you need a refill on your cardiac medications before your next appointment, please call your pharmacy.   Lab work: Lipids  Testing/Procedures: Scientist, physiological (stress test)  Follow-Up: At [facility], you and your health needs are our priority.  As part of our continuing mission to provide you with exceptional heart care, we have created designated Provider Care Teams.  These Care Teams include your primary Cardiologist (physician) and Advanced Practice Providers (APPs -  Physician Assistants and Nurse Practitioners) who all work together to provide you with the care you need, when you need it.  You will need a follow up appointment in 12 months  Providers on your designated Care Team:   Nicolasa Ducking, NP Eula Listen, PA-C Cadence Fransico Michael, [place]  COVID-19 Vaccine Information can be found at: [email or web address] For questions related to vaccine distribution or appointments, please email vaccine@ .com or call [phone].   [facility] MYOVIEW Chartered loss adjuster)  Your caregiver has ordered a Stress Test with nuclear imaging. The purpose of this test is to evaluate the blood supply to your heart muscle. This procedure is referred to as a "Non-Invasive Stress Test." This is because other than having an IV started in your vein, nothing is inserted or "invades" your body. Cardiac stress tests are done to find areas of poor blood flow to the heart by determining the extent of coronary artery disease (CAD). Some patients exercise on a treadmill, which naturally increases the blood flow to your heart, while others who are  unable to walk on a treadmill due to physical limitations have a pharmacologic/chemical stress agent called Lexiscan . This medicine will mimic walking on a treadmill by temporarily increasing your coronary blood flow.   Please note: these test may take anywhere between 2-4 hours to  complete  PLEASE REPORT TO [facility] MEDICAL MALL ENTRANCE  THE VOLUNTEERS AT THE FIRST DESK WILL DIRECT YOU WHERE TO GO  Instructions regarding medication:    ____:  Hold other medications as follows: None, may take all your medications with a small sip of water  How to prepare for your Myoview test:  Do not eat or drink after midnight No caffeine for 24 hours prior to test No smoking 24 hours prior to test. ALL your medication may be taken with a few sips of water.   (Except for the meds mention above) Please wear a short sleeve shirt. Comfortable pants are appropriate. No perfume, cologne or lotion.  PLEASE NOTIFY THE OFFICE AT LEAST 24 HOURS IN ADVANCE IF YOU ARE UNABLE TO KEEP YOUR APPOINTMENT.  [phone] AND  PLEASE NOTIFY NUCLEAR MEDICINE AT [facility] AT LEAST 24 HOURS IN ADVANCE IF YOU ARE UNABLE TO KEEP YOUR APPOINTMENT. [phone]

## 2021-12-23 DIAGNOSIS — J9601 Acute respiratory failure with hypoxia: Secondary | ICD-10-CM | POA: Diagnosis not present

## 2021-12-23 DIAGNOSIS — U071 COVID-19: Secondary | ICD-10-CM | POA: Diagnosis not present

## 2021-12-23 LAB — LIPID PANEL
Chol/HDL Ratio: 2.6 ratio (ref 0.0–4.4)
Cholesterol, Total: 165 mg/dL (ref 100–199)
HDL: 63 mg/dL (ref >39)
LDL Chol Calc (NIH): 89 mg/dL (ref 0–99)
Triglycerides: 64 mg/dL (ref 0–149)
VLDL Cholesterol Cal: 13 mg/dL (ref 5–40)

## 2021-12-24 ENCOUNTER — Other Ambulatory Visit: Payer: Self-pay | Admitting: Internal Medicine

## 2021-12-24 DIAGNOSIS — G4730 Sleep apnea, unspecified: Secondary | ICD-10-CM | POA: Diagnosis not present

## 2021-12-24 DIAGNOSIS — R0683 Snoring: Secondary | ICD-10-CM | POA: Diagnosis not present

## 2021-12-24 NOTE — Telephone Encounter (Signed)
Requested Prescriptions  Pending Prescriptions Disp Refills   omeprazole (PRILOSEC) 20 MG capsule [Pharmacy Med Name: OMEPRAZOLE DR 20 MG CAP] 30 capsule 0    Sig: TAKE 1 CAPSULE BY MOUTH ONCE DAILY     Gastroenterology: Proton Pump Inhibitors Passed - 12/24/2021  2:48 PM      Passed - Valid encounter within last 12 months    Recent Outpatient Visits          2 weeks ago Dyspnea on exertion   [facility] [place], Salvadore Oxford, NP   4 months ago Centrilobular emphysema [facility])   [facility] [name], DO   7 months ago Centrilobular emphysema [facility])   [facility] [name], DO   8 months ago Annual physical exam   [facility] [name], DO   10 months ago Centrilobular emphysema [facility])   [facility] Charon, [name], DO      Future Appointments            In 6 months [facility], [facility]

## 2021-12-25 ENCOUNTER — Telehealth: Payer: Self-pay | Admitting: Cardiovascular Disease

## 2021-12-25 NOTE — Telephone Encounter (Signed)
Return call to Brandi [name], preliminary results reviewed by nurse, also can review on her MyChart for reference. Dr. Mariah Milling has not reviewed her results at this time. Advised once Dr. Mariah Milling reads her report, will be able to give his interpretation and POC if any. Normal, will be on her MyChart, if need to adjust medications or make changes, she should expect a phone call.  Brandi [name] verbalized understanding and thankful for the return call.

## 2021-12-25 NOTE — Telephone Encounter (Signed)
Patient would like her lab resutls

## 2021-12-26 ENCOUNTER — Ambulatory Visit: Payer: Medicare HMO | Admitting: Medical

## 2021-12-26 ENCOUNTER — Other Ambulatory Visit: Payer: Self-pay

## 2021-12-26 ENCOUNTER — Ambulatory Visit
Admission: RE | Admit: 2021-12-26 | Discharge: 2021-12-26 | Disposition: A | Discharge status: Home / Self Care | Payer: No Typology Code available for payment source | Source: Ambulatory Visit | Attending: Cardiovascular Disease | Admitting: Cardiovascular Disease

## 2021-12-26 DIAGNOSIS — I208 Other forms of angina pectoris: Secondary | ICD-10-CM

## 2021-12-26 DIAGNOSIS — R0602 Shortness of breath: Secondary | ICD-10-CM | POA: Diagnosis not present

## 2021-12-26 MED ORDER — TECHNETIUM TC 99M TETROFOSMIN IV KIT
10.0000 | PACK | Freq: Once | INTRAVENOUS | Status: AC | PRN
  Administered 2021-12-26: 9.6 via INTRAVENOUS

## 2021-12-26 MED ORDER — REGADENOSON 0.4 MG/5ML IV SOLN
0.4000 mg | Freq: Once | INTRAVENOUS | Status: AC
  Administered 2021-12-26: 0.4 mg via INTRAVENOUS

## 2021-12-26 MED ORDER — TECHNETIUM TC 99M TETROFOSMIN IV KIT
30.0000 | PACK | Freq: Once | INTRAVENOUS | Status: AC | PRN
  Administered 2021-12-26: 30.4 via INTRAVENOUS

## 2021-12-27 LAB — NM MYOCAR MULTI W/SPECT W/WALL MOTION / EF
LV dias vol: 33 mL (ref 46–106)
LV sys vol: 13 mL
Nuc Stress EF: 61 %
Peak HR: 113 {beats}/min
Percent HR: 73 %
Rest HR: 74 {beats}/min
Rest Nuclear Isotope Dose: 9.6 mCi
SDS: 0
SRS: 0
SSS: 2
ST Depression (mm): 0 mm
Stress Nuclear Isotope Dose: 30.4 mCi
TID: 0.86

## 2021-12-29 ENCOUNTER — Telehealth: Payer: Self-pay

## 2021-12-29 DIAGNOSIS — E782 Mixed hyperlipidemia: Secondary | ICD-10-CM

## 2021-12-29 MED ORDER — ROSUVASTATIN CALCIUM 10 MG PO TABS: 5.0000 mg | ORAL_TABLET | Freq: Every day | ORAL | 5 refills | Status: DC

## 2021-12-29 NOTE — Telephone Encounter (Signed)
-----   Message from [name], MD sent at 12/28/2021  9:38 PM EST ----- Stress test No ischemia, low risk study, normal ejection fraction Coronary calcification noted,  consider increasing Crestor up to 10 to get LDL lower Continue Zetia

## 2021-12-29 NOTE — Telephone Encounter (Signed)
The patient has been notified of the result and verbalized understanding.  All questions (if any) were answered. Gibson Ramp, RN 12/29/2021 12:39 PM

## 2022-01-02 ENCOUNTER — Telehealth: Payer: Self-pay | Admitting: Pulmonary Disease

## 2022-01-02 DIAGNOSIS — J3489 Other specified disorders of nose and nasal sinuses: Secondary | ICD-10-CM

## 2022-01-02 DIAGNOSIS — U071 COVID-19: Secondary | ICD-10-CM | POA: Diagnosis not present

## 2022-01-02 DIAGNOSIS — J9601 Acute respiratory failure with hypoxia: Secondary | ICD-10-CM | POA: Diagnosis not present

## 2022-01-02 NOTE — Telephone Encounter (Signed)
Ok for ENT referral, Dx: Chronic nasal obstruction

## 2022-01-02 NOTE — Telephone Encounter (Signed)
ENT referral placed. Pt is aware and voiced her understanding. Nothing further needed.

## 2022-01-02 NOTE — Telephone Encounter (Signed)
Spoke to patient, who is requesting ENT referral.  She is concern that sinus issues are causing her shortness of breath. She can not breath through her right nostril.   Dr. Patsey Berthold, please advise. Thanks

## 2022-01-05 ENCOUNTER — Ambulatory Visit: Payer: Medicare HMO | Admitting: Cardiovascular Disease

## 2022-01-05 NOTE — Telephone Encounter (Signed)
Lm for patient to verify this information.

## 2022-01-05 NOTE — Telephone Encounter (Signed)
Called and spoke to patient who was not aware of DME change, called [facility] at [facility]  and let her know we are unsure of the oxygen order as ONO results, have not been read.  Nothing further needed.

## 2022-01-06 ENCOUNTER — Other Ambulatory Visit: Payer: Self-pay | Admitting: Family Medicine

## 2022-01-06 ENCOUNTER — Ambulatory Visit: Payer: Medicare HMO | Admitting: Licensed Clinical Social Worker

## 2022-01-06 DIAGNOSIS — J432 Centrilobular emphysema: Secondary | ICD-10-CM | POA: Diagnosis not present

## 2022-01-06 DIAGNOSIS — E782 Mixed hyperlipidemia: Secondary | ICD-10-CM

## 2022-01-06 DIAGNOSIS — R7303 Prediabetes: Secondary | ICD-10-CM

## 2022-01-06 NOTE — Telephone Encounter (Signed)
Requested medication (s) are due for refill today: yes  Requested medication (s) are on the active medication list: yes  Last refill:  05/02/21   Future visit scheduled: no  Notes to clinic:  Unable to refill per protocol, medication not assigned to the refill protocol.      Requested Prescriptions  Pending Prescriptions Disp Refills   BREZTRI AEROSPHERE 160-9-4.8 MCG/ACT AERO [Pharmacy Med Name: BREZTRI AEROSPHERE 160-9-4.8 MCG/AC] 10.7 g 3    Sig: USE 2 INHALATION MY MOIUTH TWICE A DAY RINSE MOUTH AFTER EACH USE     Off-Protocol Failed - 01/06/2022 12:07 PM      Failed - Medication not assigned to a protocol, review manually.      Passed - Valid encounter within last 12 months    Recent Outpatient Visits           1 month ago Dyspnea on exertion   [facility], Salvadore Oxford, NP   4 months ago Centrilobular emphysema [facility])   [facility] [name], DO   8 months ago Centrilobular emphysema [facility])   [facility] [name], DO   9 months ago Annual physical exam   [place] [name], DO   10 months ago Centrilobular emphysema [facility])   [facility] Althea Charon, [name], DO       Future Appointments             In 6 months [facility], [facility]

## 2022-01-06 NOTE — Chronic Care Management (AMB) (Signed)
° °   Clinical Social Work  Care Management   Phone Outreach    01/06/2022 Name: Yona Kosek MRN: [id number] DOB: 11-05-1956  Anavi Branscum is a 67 y.o. year old female who is a primary care patient of [name], DO .   Reason for referral: Mental Health Counseling and Resources.    F/U phone call today to assess needs, progress and barriers with care plan goals.   Patient was pre-occupied and was unable to keep phone appointment today. CCM LCSW agreed to reschedule for a later date  Plan:Appointment was rescheduled with CCM LCSW  Review of patient status, including review of consultants reports, relevant laboratory and other test results, and collaboration with appropriate care team members and the patient's provider was performed as part of comprehensive patient evaluation and provision of care management services.    Jenel Lucks, MSW, LCSW [facility] Care Management [place]   [facility] [email or web address] Phone [phone] 4:10 PM

## 2022-01-16 NOTE — Telephone Encounter (Signed)
Spoke to [name] with [employer]. She will fax ONO. [name] is not aware of patient switching DME company nor is she familiar with [employer]

## 2022-01-16 NOTE — Telephone Encounter (Signed)
ONO received and placed in Dr. Gonzalez's folder for review.  ? ?

## 2022-01-16 NOTE — Telephone Encounter (Signed)
Spoke to [place] with [employer]. Brandi [name] is requesting ONO results.  According to below message, patient was not aware of DME switch. Spoke to patient and verified this information. Patient is under the impression that her DME company is [employer].  Lm for [name] with [employer] to request ONO results from 12/2021.

## 2022-01-19 DIAGNOSIS — J9601 Acute respiratory failure with hypoxia: Secondary | ICD-10-CM | POA: Diagnosis not present

## 2022-01-19 DIAGNOSIS — U071 COVID-19: Secondary | ICD-10-CM | POA: Diagnosis not present

## 2022-01-21 ENCOUNTER — Encounter: Payer: Self-pay | Admitting: Pulmonary Disease

## 2022-01-21 ENCOUNTER — Ambulatory Visit (INDEPENDENT_AMBULATORY_CARE_PROVIDER_SITE_OTHER): Payer: No Typology Code available for payment source | Admitting: Pulmonary Disease

## 2022-01-21 ENCOUNTER — Other Ambulatory Visit: Payer: Self-pay

## 2022-01-21 VITALS — BP 100/60 | HR 84 | Temp 97.9°F | Ht 64.0 in | Wt 128.0 lb

## 2022-01-21 DIAGNOSIS — J449 Chronic obstructive pulmonary disease, unspecified: Secondary | ICD-10-CM

## 2022-01-21 DIAGNOSIS — R0902 Hypoxemia: Secondary | ICD-10-CM | POA: Diagnosis not present

## 2022-01-21 DIAGNOSIS — Z87891 Personal history of nicotine dependence: Secondary | ICD-10-CM

## 2022-01-21 DIAGNOSIS — J3489 Other specified disorders of nose and nasal sinuses: Secondary | ICD-10-CM | POA: Diagnosis not present

## 2022-01-21 DIAGNOSIS — G4736 Sleep related hypoventilation in conditions classified elsewhere: Secondary | ICD-10-CM

## 2022-01-21 DIAGNOSIS — J439 Emphysema, unspecified: Secondary | ICD-10-CM

## 2022-01-21 DIAGNOSIS — R0602 Shortness of breath: Secondary | ICD-10-CM | POA: Diagnosis not present

## 2022-01-21 MED ORDER — BREZTRI AEROSPHERE 160-9-4.8 MCG/ACT IN AERO: 2.0000 | INHALATION_SPRAY | Freq: Two times a day (BID) | RESPIRATORY_TRACT | 0 refills | Status: DC

## 2022-01-21 NOTE — Telephone Encounter (Signed)
Routing to Dr. Jayme Cloud for f/u.  Patient scheduled for visit today.

## 2022-01-21 NOTE — Telephone Encounter (Signed)
Seen today in clinic, patient instructed to continue oxygen at 2 L/min nocturnally.

## 2022-01-21 NOTE — Patient Instructions (Signed)
Continue using your Breztri.  We are going to refer you to pulmonary rehab, this is an exercise program that is geared towards helping you with managing her COPD and breathing better.  We will see you in follow-up in 3 months time call sooner should any new problems arise.

## 2022-01-21 NOTE — Progress Notes (Incomplete)
Subjective:    Patient ID: Brandi [name], female    DOB: Jan 20, 1956, 66 y.o.   MRN: [id number]  HPI  DATA 04/09/2021 LDCT: Moderate to severe paraseptal and centrilobular emphysema.  Left upper lobe nodule measuring 2 mm (decreased from prior), compression fractures T7 and T9 noted previously and unchanged.  Coronary artery calcifications. 07/04/2021 alpha-1 antitrypsin: Phenotype MM, level 140 mg/dL (normal) 62/02/5596 overnight oximetry: Baseline O2 sat 90% during sleep with desaturations as low as 83% during the night desaturation events lasted for over an hour. 08/14/2021 PFTs: FEV1 1.13 L or 46% predicted, FVC 2.42 L or 76% predicted, FEV1/FVC 47%, there is a mild response to bronchodilator with a 10% net change.  There is hyperinflation with TLC at 128% and air trapping with RV at 188%.  Diffusion capacity is moderately to severely reduced by Kco.  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  Review of Systems A 10 point review of systems was performed and it is as noted above otherwise negative.  Patient Active Problem List   Diagnosis Date Noted   Pre-diabetes 04/08/2021   Vitamin D deficiency 04/08/2021   COVID-19 vaccination declined 12/04/2020   Centrilobular emphysema (HCC) 04/08/2020   Nodule of upper lobe of left lung 04/08/2020   Aortic atherosclerosis (HCC) 05/21/2019   Coronary artery calcification seen on CT scan 05/21/2019   Former smoker 05/21/2019   Hyperlipidemia 05/21/2019      Current Meds  Medication Sig   albuterol (PROVENTIL) (2.5 MG/3ML) 0.083% nebulizer solution Take 3 mLs (2.5 mg total) by nebulization every 6 (six) hours as needed for wheezing or shortness of breath.   albuterol (VENTOLIN HFA) 108 (90 Base) MCG/ACT inhaler Inhale 2 puffs into the lungs every 4 (four) hours as needed for wheezing or shortness of breath.   alendronate (FOSAMAX) 70 MG tablet TAKE ONE TABLET BY MOUTH EACH WEEK, ON AN EMPTY STOMACH BEFORE BREAKFAST  WITH 8oz OF WATER AND REMAIN UPRIGHT FOR :30   alum & mag hydroxide-simeth (MAALOX/MYLANTA) 200-200-20 MG/5ML suspension Take 30 mLs by mouth every 4 (four) hours as needed for indigestion or heartburn.   aspirin EC 81 MG tablet Take 1 tablet (81 mg total) by mouth daily.   BREZTRI AEROSPHERE 160-9-4.8 MCG/ACT AERO USE 2 INHALATION MY MOIUTH TWICE A DAY RINSE MOUTH AFTER EACH USE   busPIRone (BUSPAR) 5 MG tablet TAKE 1 TABLET BY MOUTH TWICE DAILY AS NEEDED FOR ANXIETY   Calcium Carbonate-Vit D-Min (CALCIUM 1200) 1200-1000 MG-UNIT CHEW Chew 1,200 mg by mouth daily.   Cholecalciferol (VITAMIN D3) 125 MCG (5000 UT) TABS Take by mouth. Vitamin D3 5,000 IU daily for 12 weeks then reduce to OTC Vitamin D3 2,000 IU daily for maintenance   ezetimibe (ZETIA) 10 MG tablet Take 1 tablet (10 mg total) by mouth daily.   famotidine (PEPCID) 20 MG tablet Take 1 tablet (20 mg total) by mouth 2 (two) times daily.   feeding supplement (ENSURE ENLIVE / ENSURE PLUS) LIQD Take 237 mLs by mouth 2 (two) times daily between meals.   Multiple Vitamin (MULTIVITAMIN WITH MINERALS) TABS tablet Take 1 tablet by mouth daily.   omeprazole (PRILOSEC) 20 MG capsule TAKE 1 CAPSULE BY MOUTH ONCE DAILY   OXYGEN Inhale into the lungs at bedtime as needed.   rosuvastatin (CRESTOR) 10 MG tablet Take 0.5 tablets (5 mg total) by mouth daily.   sodium chloride (OCEAN) 0.65 % SOLN nasal spray Place 1 spray into both nostrils as needed for congestion.  Specialty Vitamins Products (HAIR NOURISHING SUPPLEMENT PO) Take 1 tablet by mouth daily.   triamcinolone cream (KENALOG) 0.5 % Apply 1 application topically 2 (two) times daily. To affected areas, for up to 2 weeks.       Objective:   Physical Exam BP 100/60 (BP Location: Left Arm, Patient Position: Sitting, Cuff Size: Normal)    Pulse 84    Temp 97.9 F (36.6 C) (Oral)    Ht 5\' 4"  (1.626 m)    Wt 128 lb (58.1 kg)    SpO2 96%    BMI 21.97 kg/m  GENERAL: Well-developed,  well-nourished woman, no acute distress.  No conversational dyspnea. HEAD: Normocephalic, atraumatic.  EYES: Pupils equal, round, reactive to light.  No scleral icterus.  MOUTH: Poor dentition.  She does have visible nasal septal deviation. NECK: Supple. No thyromegaly. Trachea midline. No JVD.  No adenopathy. PULMONARY: Good air entry bilaterally.  No adventitious sounds. CARDIOVASCULAR: S1 and S2. Regular rate and rhythm.  No rubs, murmurs or gallops heard. ABDOMEN: Benign. MUSCULOSKELETAL: No joint deformity, no clubbing, no edema.  NEUROLOGIC: No focal deficit, no gait disturbance, speech is fluent. SKIN: Intact,warm,dry. PSYCH: Mood and behavior normal.       Assessment & Plan:

## 2022-01-21 NOTE — Telephone Encounter (Signed)
Noted.  Will close encounter.  

## 2022-01-23 DIAGNOSIS — J9601 Acute respiratory failure with hypoxia: Secondary | ICD-10-CM | POA: Diagnosis not present

## 2022-01-23 DIAGNOSIS — U071 COVID-19: Secondary | ICD-10-CM | POA: Diagnosis not present

## 2022-01-25 ENCOUNTER — Other Ambulatory Visit: Payer: Self-pay | Admitting: Internal Medicine

## 2022-01-26 NOTE — Telephone Encounter (Signed)
Requested Prescriptions  Pending Prescriptions Disp Refills   omeprazole (PRILOSEC) 20 MG capsule [Pharmacy Med Name: OMEPRAZOLE DR 20 MG CAP] 90 capsule 0    Sig: TAKE 1 CAPSULE BY MOUTH ONCE DAILY     Gastroenterology: Proton Pump Inhibitors Passed - 01/25/2022  3:44 PM      Passed - Valid encounter within last 12 months    Recent Outpatient Visits          1 month ago Dyspnea on exertion   [facility], Salvadore Oxford, NP   5 months ago Centrilobular emphysema [facility])   [facility] [name], DO   8 months ago Centrilobular emphysema [facility])   [facility] [name], DO   9 months ago Annual physical exam   [facility] [name], DO   11 months ago Centrilobular emphysema [facility])   [facility] Althea Charon, [name], DO      Future Appointments            In 5 months [facility], [facility]

## 2022-01-27 ENCOUNTER — Ambulatory Visit (INDEPENDENT_AMBULATORY_CARE_PROVIDER_SITE_OTHER): Payer: No Typology Code available for payment source | Admitting: Licensed Clinical Social Worker

## 2022-01-27 DIAGNOSIS — R7303 Prediabetes: Secondary | ICD-10-CM

## 2022-01-27 DIAGNOSIS — I700 Atherosclerosis of aorta: Secondary | ICD-10-CM

## 2022-01-27 DIAGNOSIS — E782 Mixed hyperlipidemia: Secondary | ICD-10-CM
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
## 2022-01-27 NOTE — Patient Instructions (Signed)
Visit Information  Thank you for taking time to visit with me today. Please don't hesitate to contact me if I can be of assistance to you before our next scheduled telephone appointment.  Following are the goals we discussed today:  Patient Goals/Self-Care Activities: Over the next 120 days Continue compliance with medication management Attend all scheduled appointments with providers Contact office with any questions or concerns Avoid negative self-talk Develop a plan to deal with triggers like holidays, anniversaries Spend time or talk with others every day Practice positive thinking and self-talk  Our next appointment is by telephone on 04/07/22 at 1:15 PM  Please call the care guide team at [phone] if you need to cancel or reschedule your appointment.   If you are experiencing a Mental Health or Behavioral Health Crisis or need someone to talk to, please call the Suicide and Crisis Lifeline: 988 call 911   Patient verbalizes understanding of instructions and care plan provided today and agrees to view in MyChart. Active MyChart status confirmed with patient.    Jenel Lucks, MSW, LCSW [facility] Care Management [place]   [facility] [email or web address] Phone [phone] 5:24 PM

## 2022-02-02 DIAGNOSIS — J9601 Acute respiratory failure with hypoxia: Secondary | ICD-10-CM | POA: Diagnosis not present

## 2022-02-02 DIAGNOSIS — U071 COVID-19: Secondary | ICD-10-CM | POA: Diagnosis not present

## 2022-02-03 DIAGNOSIS — E782 Mixed hyperlipidemia: Secondary | ICD-10-CM

## 2022-02-04 ENCOUNTER — Ambulatory Visit (INDEPENDENT_AMBULATORY_CARE_PROVIDER_SITE_OTHER): Payer: No Typology Code available for payment source | Admitting: Pharmacist

## 2022-02-04 DIAGNOSIS — I700 Atherosclerosis of aorta: Secondary | ICD-10-CM

## 2022-02-04 DIAGNOSIS — J432 Centrilobular emphysema: Secondary | ICD-10-CM

## 2022-02-04 NOTE — Patient Instructions (Signed)
Visit Information ? ?Thank you for taking time to visit with me today. Please don't hesitate to contact me if I can be of assistance to you before our next scheduled telephone appointment. ? ?Following are the goals we discussed today:  ? Goals Addressed   ? ?  ?  ?  ?  ? This Visit's Progress  ?  Pharmacy Goals     ?  Our goal bad cholesterol, or LDL, is less than 70. This is why it is important to continue taking your rosuvastatin and ezetimibe. ? ?Feel free to call me with any questions or concerns. I look forward to our next call! ? ? ?Estelle Grumbles, PharmD, BCACP, CPP ?Clinical Pharmacist ?[facility] ?[place] ?[phone] ?  ? ?  ? ? ? ?Our next appointment is by telephone on 03/11/2022 at 10:45 AM ? ?Please call the care guide team at [phone] if you need to cancel or reschedule your appointment.  ? ? ?Patient verbalizes understanding of instructions and care plan provided today and agrees to view in MyChart. Active MyChart status confirmed with patient.   ? ?

## 2022-02-04 NOTE — Chronic Care Management (AMB) (Signed)
Chronic Care Management CCM Pharmacy Note  02/04/2022 Name:  Brandi [name] MRN:  [id number] DOB:  02/15/1956   Subjective: Brandi [name] is an 66 y.o. year old female who is a primary patient of [name], DO.  The CCM team was consulted for assistance with disease management and care coordination needs.    Engaged with patient by telephone for follow up visit for pharmacy case management and/or care coordination services.   Objective:  Medications Reviewed Today     Reviewed by [name], RPH-CPP (Pharmacist) on 02/04/22 at 1103  Med List Status: <None>   Medication Order Taking? Sig Documenting Provider Last Dose Status Informant  albuterol (PROVENTIL) (2.5 MG/3ML) 0.083% nebulizer solution [id number] Yes Take 3 mLs (2.5 mg total) by nebulization every 6 (six) hours as needed for wheezing or shortness of breath. [name], MD Taking Active   albuterol (VENTOLIN HFA) 108 (90 Base) MCG/ACT inhaler [id number] Yes Inhale 2 puffs into the lungs every 4 (four) hours as needed for wheezing or shortness of breath. [name], MD Taking Active   alendronate (FOSAMAX) 70 MG tablet [id number]  TAKE ONE TABLET BY MOUTH EACH WEEK, ON AN EMPTY STOMACH BEFORE BREAKFAST WITH 8oz OF WATER AND REMAIN UPRIGHT FOR :30 [name], [name], MD  Active   alum & mag hydroxide-simeth (MAALOX/MYLANTA) 200-200-20 MG/5ML suspension [id number]  Take 30 mLs by mouth every 4 (four) hours as needed for indigestion or heartburn. [name], DO  Active   aspirin EC 81 MG tablet [id number]  Take 1 tablet (81 mg total) by mouth daily. [name], MD  Active Self  BREZTRI AEROSPHERE 160-9-4.8 MCG/ACT [name] [id number] Yes USE 2 INHALATION MY MOIUTH TWICE A DAY RINSE MOUTH AFTER EACH USE [name], DO Taking Active   Budeson-Glycopyrrol-Formoterol (BREZTRI AEROSPHERE) 160-9-4.8 MCG/ACT [name] [id number]  Inhale 2 puffs into the lungs in the morning and at bedtime.  [name], MD  Active   busPIRone (BUSPAR) 5 MG tablet [id number]  TAKE 1 TABLET BY MOUTH TWICE DAILY AS NEEDED FOR ANXIETY [name], [name], DO  Active   Calcium Carbonate-Vit D-Min (CALCIUM 1200) 1200-1000 MG-UNIT CHEW [id number]  Chew 1,200 mg by mouth daily. [name], MD  Active   Cholecalciferol (VITAMIN D3) 125 MCG (5000 UT) TABS [id number]  Take by mouth. Vitamin D3 5,000 IU daily for 12 weeks then reduce to OTC Vitamin D3 2,000 IU daily for maintenance [provider]  Active   ezetimibe (ZETIA) 10 MG tablet [id number] Yes Take 1 tablet (10 mg total) by mouth daily. [name], MD Taking Active   famotidine (PEPCID) 20 MG tablet [id number]  Take 1 tablet (20 mg total) by mouth 2 (two) times daily.  Patient taking differently: Take 20 mg by mouth 2 (two) times daily as needed.   [name], [name], DO  Active   feeding supplement (ENSURE ENLIVE / ENSURE PLUS) LIQD [id number]  Take 237 mLs by mouth 2 (two) times daily between meals. [name], DO  Active   Multiple Vitamin (MULTIVITAMIN WITH MINERALS) TABS tablet [id number]  Take 1 tablet by mouth daily. [name], DO  Active   omeprazole (PRILOSEC) 20 MG capsule [id number] Yes TAKE 1 CAPSULE BY MOUTH ONCE DAILY Lorre Munroe, NP Taking Active   OXYGEN [id number]  Inhale into the lungs at bedtime as needed. [provider]  Active   rosuvastatin (CRESTOR) 10 MG tablet [id number]  Take 0.5 tablets (5  mg total) by mouth daily. [name], MD  Active   sodium chloride (OCEAN) 0.65 % SOLN nasal spray [id number]  Place 1 spray into both nostrils as needed for congestion. [provider]  Active   Specialty Vitamins Products (HAIR NOURISHING SUPPLEMENT PO) [id number]  Take 1 tablet by mouth daily. [provider]  Active             Pertinent Labs:  Lab Results  Component Value Date   HGBA1C 5.8 (H) 01/20/2021   Lab Results  Component Value Date    CHOL 165 12/22/2021   HDL 63 12/22/2021   LDLCALC 89 12/22/2021   TRIG 64 12/22/2021   CHOLHDL 2.6 12/22/2021   Lab Results  Component Value Date   CREATININE 0.87 11/17/2021   BUN 12 11/17/2021   NA 140 11/17/2021   K 4.2 11/17/2021   CL 102 11/17/2021   CO2 24 11/17/2021    SDOH:  (Social Determinants of Health) assessments and interventions performed:    CCM Care Plan  Review of patient past medical history, allergies, medications, health status, including review of consultants reports, laboratory and other test data, was performed as part of comprehensive evaluation and provision of chronic care management services.   Care Plan : PharmD - Medication Assistance/Adherence  Updates made by [name], RPH-CPP since 02/04/2022 12:00 AM     Problem: Disease Progression      Long-Range Goal: Disease Progression Prevented or Minimized   Start Date: 04/30/2021  Expected End Date: 07/29/2021  This Visit's Progress: On track  Recent Progress: On track  Priority: High  Note:   Current Barriers:  Unable to independently afford treatment regimen Reports cost of Breztri inhaler unaffordable through [facility] health plan Patient enrolled in [facility] 2022 COPD Inhaler Support Program  Pharmacist Clinical Goal(s):  Over the next 90 days, patient will verbalize ability to afford treatment regimen through collaboration with PharmD and provider.   Interventions: 1:1 collaboration with [name], DO regarding development and update of comprehensive plan of care as evidenced by provider attestation and co-signature Inter-disciplinary care team collaboration (see longitudinal plan of care) Perform chart review Office Visit with [facility] on 12/22/2021. Provider advised patient: Penne Lash Myoview Per result note from lipid panel on 12/22/2021, Cardiologist advised patient to increasing Crestor up to 10 mg daily continue Zetia to target LDL  goal of <70 Lexiscan Myoview completed on 12/26/2021 Per telephone note from [facility] [place] on 01/02/2022, pulmonologist placed referral to ENT as requested by patient Office Visit with [facility] [place] on 01/21/2022. Provider advised patient: Continue using Breztri Referral placed to pulmonary rehab Today patient reports she has been unable to afford to start Pulmonary Rehab at this time due to cost (copayment: $15/visit x 3 times/week) Reports called her insurance company, but health plan was unable to offer an option to reduce this cost Patient reports feels like she is starting to have a sinus infection. Reports pressure in her sinuses and sometimes having dried blood in her nose. Encourage patient to follow up with PCP or pulmonologist regarding symptoms.  States will place call to provider Reports acid symptoms currently controlled by taking omeprazole 20 mg daily, but occasionally uses famotidine as needed  Counsel on strategies to help avoid acid triggers  Hyperlipidemia: Current treatment: Ezetimibe 10 mg daily Rosuvastatin 5 mg daily Previous therapies tried: rosuvastatin 20 mg daily (numbness/tingling) Today identify patient has continued to take rosuvastatin 5 mg daily, rather than  increase to rosuvastatin 10 mg daily Reports will start taking rosuvastatin 10 mg daily today, first using up current supply of rosuvastatin 5 mg - 2 tablets (for 10 mg total) daily Note latest Rx sent by Cardiology to place on hold with pharmacy is for rosuvastatin 10 mg, but directions "Take 0.5 tablets (5 mg total) by mouth daily" Send message to Cardiology team to request prescription be updated if Cardiologist wanting patient to have dose of: rosuvastatin 10 mg daily Note patient uses weekly pillbox to organize her medications/adherence aid  COPD: Current treatment: Breztri inhaler - 2 puffs in morning and at bedtime Albuterol inhaler - 1 puffs every 4 hours as  needed Overnight oxygen Using nasal saline as directed Reports using Breztri inhaler twice daily (and rinse out mouth after use) and use albuterol inhaler as needed as directed  Medication Assistance: Based on patient report, meets income requirement for Extra Help through [employer], but is concerned that would not qualify based on other resources (money received from [employer]) Based on reported income, patient would qualify for patient assistance for [employer] inhaler from [employer], but program would require patient to have completed Extra Help application Have counseled patient on options for completing Extra Help application online or paper application from [employer] office. Offer to assist patient with Extra Help application over the phone Patient reports that she is not interested in working on patient assistance or extra help applications at this time. Instead prefers to pay tier 3 copayment, which per [facility] website, is $45/month  Medication Adherence: Patient uses weekly pillbox as adherence aid   Patient Goals/Self-Care Activities Over the next 90 days, patient will:  - take medications as prescribed - collaborate with provider on medication access solutions - attend medical appointments as scheduled   Appointment with ENT on 3/8 to check out her sinuses  Follow Up Plan: Telephone follow up appointment with care management team member scheduled for: 03/11/2022 at 10:45 AM       Estelle Grumbles, PharmD, Patsy [place], CPP Clinical Pharmacist [facility] [phone]

## 2022-02-06 ENCOUNTER — Telehealth: Payer: Self-pay | Admitting: Cardiovascular Disease

## 2022-02-06 DIAGNOSIS — E782 Mixed hyperlipidemia: Secondary | ICD-10-CM

## 2022-02-06 NOTE — Telephone Encounter (Signed)
PCP pharmacist states Crestor was changed to 10 mg. States directions were different, states cut in half, which would be 5 mg

## 2022-02-09 MED ORDER — ROSUVASTATIN CALCIUM 10 MG PO TABS: 10.0000 mg | ORAL_TABLET | Freq: Every day | ORAL | 3 refills | Status: DC

## 2022-02-09 NOTE — Telephone Encounter (Signed)
Crestor updated to 10 daily ? ?RN who spoke with pt regarding her results and recommendation from Dr. Mariah Milling to increase to Crestor 10 mg daily, did not update med list or send in new script.  ? ?Script sent in to pharmacy  ?

## 2022-02-11 DIAGNOSIS — H6063 Unspecified chronic otitis externa, bilateral: Secondary | ICD-10-CM | POA: Diagnosis not present

## 2022-02-11 DIAGNOSIS — R0602 Shortness of breath: Secondary | ICD-10-CM | POA: Diagnosis not present

## 2022-02-11 DIAGNOSIS — J301 Allergic rhinitis due to pollen: Secondary | ICD-10-CM | POA: Diagnosis not present

## 2022-02-11 DIAGNOSIS — J3489 Other specified disorders of nose and nasal sinuses: Secondary | ICD-10-CM | POA: Diagnosis not present

## 2022-02-12 ENCOUNTER — Encounter: Payer: Self-pay | Admitting: Family Medicine

## 2022-02-12 ENCOUNTER — Other Ambulatory Visit: Payer: Self-pay | Admitting: Otolaryngology

## 2022-02-12 ENCOUNTER — Ambulatory Visit (INDEPENDENT_AMBULATORY_CARE_PROVIDER_SITE_OTHER): Payer: No Typology Code available for payment source | Admitting: Family Medicine

## 2022-02-12 ENCOUNTER — Other Ambulatory Visit: Payer: Self-pay

## 2022-02-12 VITALS — BP 118/68 | HR 84 | Ht 64.0 in | Wt 126.2 lb

## 2022-02-12 DIAGNOSIS — J441 Chronic obstructive pulmonary disease with (acute) exacerbation: Secondary | ICD-10-CM

## 2022-02-12 DIAGNOSIS — J3489 Other specified disorders of nose and nasal sinuses: Secondary | ICD-10-CM

## 2022-02-12 MED ORDER — PREDNISONE 20 MG PO TABS: ORAL_TABLET | ORAL | 0 refills | Status: DC

## 2022-02-12 MED ORDER — LEVOFLOXACIN 500 MG PO TABS: 500.0000 mg | ORAL_TABLET | Freq: Every day | ORAL | 0 refills | Status: DC

## 2022-02-12 NOTE — Patient Instructions (Addendum)
Thank you for coming to the office today. ? ?COPD Flare likely ?I don't hear evidence of pneumonia ? ?Start taking Levaquin antibiotic 500mg  daily x 7 days ?Start Prednisone taper 7 ? ?X-ray if you are interested, walk in first come first serve Mon-Thurs only. ? ? ?Please schedule a Follow-up Appointment to: Return if symptoms worsen or fail to improve. ? ?If you have any other questions or concerns, please feel free to call the office or send a message through MyChart. You may also schedule an earlier appointment if necessary. ? ?Additionally, you may be receiving a survey about your experience at our office within a few days to 1 week by e-mail or mail. We value your feedback. ? ? , DO ?[facility], [facility] ?

## 2022-02-12 NOTE — Progress Notes (Signed)
? ?Subjective:  ? ? Patient ID: Brandi [name], female    DOB: Oct 21, 1956, 66 y.o.   MRN: [id number] ? ?Ciaran Imdieke is a 66 y.o. female presenting on 02/12/2022 for Chest Pain ? ? ?HPI ? ?ACUTE COPD Exacerbation ?Last seen by Pulmonology on 01/21/22, she was advised to continue Breztri and given referral for Pulmonary Rehab she did not pursue this due to cost. ? ?She has increased sputum production and chest wall muscles sore with breathing and cough at times. ? ?She is worried about progression to pneumonia ? ?She had amoxicillin for dental problem. recently ? ?Has seen ENT scheduled for CT Scan, pending. ? ?Denies fever chills  ? ? ?Depression screen [facility] 2/9 02/12/2022 08/12/2021 07/08/2021  ?Decreased Interest 0 0 0  ?Down, Depressed, Hopeless 0 0 0  ?PHQ - 2 Score 0 0 0  ?Altered sleeping 0 0 -  ?Tired, decreased energy 0 0 -  ?Change in appetite 0 0 -  ?Feeling bad or failure about yourself  0 0 -  ?Trouble concentrating 0 0 -  ?Moving slowly or fidgety/restless 0 0 -  ?Suicidal thoughts 0 0 -  ?PHQ-9 Score 0 0 -  ?Difficult doing work/chores Not difficult at all Not difficult at all -  ? ? ?Social History  ? ?Tobacco Use  ? Smoking status: Former  ?  Packs/day: 0.75  ?  Years: 41.00  ?  Pack years: 30.75  ?  Types: E-cigarettes, Cigarettes  ?  Quit date: 12/08/2015  ?  Years since quitting: 6.1  ? Smokeless tobacco: Former  ?Vaping Use  ? Vaping Use: Never used  ?Substance Use Topics  ? Alcohol use: No  ? Drug use: No  ? ? ?Review of Systems ?Per HPI unless specifically indicated above ? ?   ?Objective:  ?  ?BP 118/68   Pulse 84   Ht 5\' 4"  (1.626 m)   Wt 126 lb 3.2 oz (57.2 kg)   SpO2 98%   BMI 21.66 kg/m?   ?Wt Readings from Last 3 Encounters:  ?02/12/22 126 lb 3.2 oz (57.2 kg)  ?01/21/22 128 lb (58.1 kg)  ?12/22/21 123 lb 6 oz (56 kg)  ?  ?Physical Exam ?Vitals and nursing note reviewed.  ?Constitutional:   ?   General: She is not in acute distress. ?   Appearance: She is well-developed. She is not diaphoretic.   ?   Comments: Well-appearing, comfortable, cooperative  ?HENT:  ?   Head: Normocephalic and atraumatic.  ?Eyes:  ?   General:     ?   Right eye: No discharge.     ?   Left eye: No discharge.  ?   Conjunctiva/sclera: Conjunctivae normal.  ?Neck:  ?   Thyroid: No thyromegaly.  ?Cardiovascular:  ?   Rate and Rhythm: Normal rate and regular rhythm.  ?   Heart sounds: Normal heart sounds. No murmur heard. ?Pulmonary:  ?   Effort: No respiratory distress.  ?   Breath sounds: No wheezing or rales.  ?   Comments: Reduced air movement. ?Musculoskeletal:     ?   General: Normal range of motion.  ?   Cervical back: Normal range of motion and neck supple.  ?Lymphadenopathy:  ?   Cervical: No cervical adenopathy.  ?Skin: ?   General: Skin is warm and dry.  ?   Findings: No erythema or rash.  ?Neurological:  ?   Mental Status: She is alert and oriented to person, place, and time.  ?  Psychiatric:     ?   Behavior: Behavior normal.  ?   Comments: Well groomed, good eye contact, normal speech and thoughts  ? ?Results for orders placed or performed during the hospital encounter of 12/26/21  ?NM Myocar Multi W/Spect W/Wall Motion / EF  ?Result Value Ref Range  ? Rest HR 74.0 bpm  ? Rest BP 134/79 mmHg  ? Peak HR 113 bpm  ? Peak BP 146/72 mmHg  ? Percent HR 73.0 %  ? ST Depression (mm) 0 mm  ? Rest Nuclear Isotope Dose 9.6 mCi  ? Stress Nuclear Isotope Dose 30.4 mCi  ? SSS 2.0   ? SRS 0.0   ? SDS 0.0   ? TID 0.86   ? LV sys vol 13.0 mL  ? LV dias vol 33.0 46 - 106 mL  ? Nuc Stress EF 61 %  ? ?   ?Assessment & Plan:  ? ?Problem List Items Addressed This Visit   ? ? Centrilobular emphysema ([place]) - Primary  ? Relevant Medications  ? levofloxacin (LEVAQUIN) 500 MG tablet  ? predniSONE (DELTASONE) 20 MG tablet  ?  ?COPD with early flare, inc sputum production ?Afebrile. History and Exam not suggestive of PNA ?No wheezing today, some reduced air movement ? ?Prednisone 7 day and Levaquin 7 day taper. ? ?Continue current Breztri per  Pulmonology, she is unable to proceed w/ Pulm rehab at this time. ? ?Return criteria given ? ?Ordered CXR as back up option if she is worsening can do walk in for CXR. ? ?Meds ordered this encounter  ?Medications  ? levofloxacin (LEVAQUIN) 500 MG tablet  ?  Sig: Take 1 tablet (500 mg total) by mouth daily. For 7 days  ?  Dispense:  7 tablet  ?  Refill:  0  ? predniSONE (DELTASONE) 20 MG tablet  ?  Sig: Take daily with food. Start with 60mg  (3 pills) x 2 days, then reduce to 40mg  (2 pills) x 2 days, then 20mg  (1 pill) x 3 days  ?  Dispense:  13 tablet  ?  Refill:  0  ? ? ?Follow up plan: ?Return if symptoms worsen or fail to improve. ? ? ?Nobie Putnam, DO ?[facility] ?[facility] ?02/12/2022, 11:05 AM ?

## 2022-02-16 DIAGNOSIS — J9601 Acute respiratory failure with hypoxia: Secondary | ICD-10-CM | POA: Diagnosis not present

## 2022-02-16 DIAGNOSIS — U071 COVID-19: Secondary | ICD-10-CM | POA: Diagnosis not present

## 2022-02-19 ENCOUNTER — Telehealth: Payer: Self-pay | Admitting: Pulmonary Disease

## 2022-02-19 NOTE — Telephone Encounter (Signed)
Will forward to [facility] [place]. Thanks.  ?

## 2022-02-19 NOTE — Telephone Encounter (Signed)
I called and spoke with Tenny Craw at [facility] and he stated the patient was asking them to call the office for 02 order. We have never placed an 02 order for this patient. She did have an ONO back on 07/09/2021 and  Per Dr Jayme Cloud patient qualifies for 2L of O2 at night. ?  ?Patient was not in agreement to 2L at night until she is able to see the ONO results her self. Will mail her a copy of results.   ? ?The patient has been seen 11/17/21, 12/11/21, 01/21/22 ? ?We have checked your oxygen levels when you walk once on 12 December and once today and you do not meet qualification for a portable oxygen source. ?  ? ? ?

## 2022-02-20 ENCOUNTER — Telehealth: Payer: Self-pay

## 2022-02-20 DIAGNOSIS — J9601 Acute respiratory failure with hypoxia: Secondary | ICD-10-CM | POA: Diagnosis not present

## 2022-02-20 DIAGNOSIS — J449 Chronic obstructive pulmonary disease, unspecified: Secondary | ICD-10-CM

## 2022-02-20 DIAGNOSIS — U071 COVID-19: Secondary | ICD-10-CM | POA: Diagnosis not present

## 2022-02-20 NOTE — Telephone Encounter (Signed)
Created in error

## 2022-02-20 NOTE — Telephone Encounter (Signed)
I was speaking with [facility]. The patient now has [facility] which uses [facility] for 02.  Mrs. Boggio states that she has 02 at home that she uses that has been supplied by [employer].  She states she has been using 02 at home since she had Covid back in 01/2021.  I know Dr. Jayme Cloud ordered ONO for this patient and she does need 02 at night.  When the patient was tested in the office she did not qualify for 02 during they day. ? ?Dr. Jayme Cloud the patient needs an order placed for 02 at night to be sent to [facility] along with ONO results  ?Fax # [phone] ?

## 2022-02-20 NOTE — Telephone Encounter (Signed)
Order placed nothing further needed.  °

## 2022-02-20 NOTE — Telephone Encounter (Signed)
Order placed for 2L OF O2 at night. Nothing further needed.  ?

## 2022-02-24 ENCOUNTER — Ambulatory Visit
Admission: RE | Admit: 2022-02-24 | Discharge: 2022-02-24 | Disposition: A | Discharge status: Home / Self Care | Payer: No Typology Code available for payment source | Source: Ambulatory Visit | Attending: Otolaryngology | Admitting: Otolaryngology

## 2022-02-24 DIAGNOSIS — R600 Localized edema: Secondary | ICD-10-CM | POA: Diagnosis not present

## 2022-02-24 DIAGNOSIS — J3489 Other specified disorders of nose and nasal sinuses: Secondary | ICD-10-CM

## 2022-03-02 DIAGNOSIS — J9601 Acute respiratory failure with hypoxia: Secondary | ICD-10-CM | POA: Diagnosis not present

## 2022-03-02 DIAGNOSIS — U071 COVID-19: Secondary | ICD-10-CM | POA: Diagnosis not present

## 2022-03-06 DIAGNOSIS — J449 Chronic obstructive pulmonary disease, unspecified: Secondary | ICD-10-CM

## 2022-03-06 DIAGNOSIS — E785 Hyperlipidemia, unspecified: Secondary | ICD-10-CM

## 2022-03-10 ENCOUNTER — Ambulatory Visit: Payer: Self-pay | Admitting: *Deleted

## 2022-03-10 NOTE — Telephone Encounter (Signed)
?  Chief Complaint: Vomiting ?Symptoms: Nausea and vomiting since last evening, last vomited during night, clear. 1 episode of loose stool today. Subjective fever, mild lightheadedness "Haven't eaten, weak." ?Frequency: Yesterday evening ?Pertinent Negatives: Patient denies abdominal pain. States is staying hydrated with Pedialyte. Urinating as usual, not dark. ?Disposition: [] ED /[] Urgent Care (no appt availability in office) / [x] Appointment(In office/virtual)/ []  [place] [facility]/ [] Home Care/ [] Refused Recommended Disposition /[] [place] [facility]/ []  Follow-up with PCP ?Additional Notes: Virtual appt secured for 4pm tomorrow per protocol of 24 hr time frame. Pt states will cancel if feeling better tomorrow.Care advise given.  ?Reason for Disposition ? [1] MILD or MODERATE vomiting AND [2] present > 48 hours (2 days) (Exception: mild vomiting with associated diarrhea) ? ?Answer Assessment - Initial Assessment Questions ?1. VOMITING SEVERITY: "How many times have you vomited in the past 24 hours?"  ?   - MILD:  1 - 2 times/day ?   - MODERATE: 3 - 5 times/day, decreased oral intake without significant weight loss or symptoms of dehydration ?   - SEVERE: 6 or more times/day, vomits everything or nearly everything, with significant weight loss, symptoms of dehydration  ?    moderate ?2. ONSET: "When did the vomiting begin?"  ?    Yesterday evening ?3. FLUIDS: "What fluids or food have you vomited up today?" "Have you been able to keep any fluids down?" ?    Yes, some ?4. ABDOMINAL PAIN: "Are your having any abdominal pain?" If yes : "How bad is it and what does it feel like?" (e.g., crampy, dull, intermittent, constant)  ?    no ?5. DIARRHEA: "Is there any diarrhea?" If Yes, ask: "How many times today?"  ?    1 episode loose stool ?6. CONTACTS: "Is there anyone else in the family with the same symptoms?"  ?    no ?7. CAUSE: "What do you think is causing your vomiting?" ?     ?8. HYDRATION STATUS: "Any  signs of dehydration?" (e.g., dry mouth [not only dry lips], too weak to stand) "When did you last urinate?" ?    Lightheaded, urinating, not dark. ?9. OTHER SYMPTOMS: "Do you have any other symptoms?" (e.g., fever, headache, vertigo, vomiting blood or coffee grounds, recent head injury) ?    Nausea, vomiting, subjective fever earlier ? ?Protocols used: Vomiting-A-AH ? ?

## 2022-03-11 ENCOUNTER — Telehealth: Payer: No Typology Code available for payment source

## 2022-03-11 ENCOUNTER — Telehealth (INDEPENDENT_AMBULATORY_CARE_PROVIDER_SITE_OTHER): Payer: No Typology Code available for payment source | Admitting: Family Medicine

## 2022-03-11 ENCOUNTER — Encounter: Payer: Self-pay | Admitting: Family Medicine

## 2022-03-11 VITALS — Ht 64.0 in | Wt 126.0 lb

## 2022-03-11 DIAGNOSIS — R112 Nausea with vomiting, unspecified: Secondary | ICD-10-CM | POA: Diagnosis not present

## 2022-03-11 DIAGNOSIS — R197 Diarrhea, unspecified: Secondary | ICD-10-CM | POA: Diagnosis not present

## 2022-03-11 DIAGNOSIS — A084 Viral intestinal infection, unspecified: Secondary | ICD-10-CM | POA: Diagnosis not present

## 2022-03-11 MED ORDER — ONDANSETRON 4 MG PO TBDP: 4.0000 mg | ORAL_TABLET | Freq: Three times a day (TID) | ORAL | 0 refills | Status: DC | PRN

## 2022-03-11 NOTE — Progress Notes (Signed)
Virtual Visit via Telephone ?The purpose of this virtual visit is to provide medical care while limiting exposure to the novel coronavirus (COVID19) for both patient and office staff. ? ?Consent was obtained for phone visit:  Yes.   ?Answered questions that patient had about telehealth interaction:  Yes.   ?I discussed the limitations, risks, security and privacy concerns of performing an evaluation and management service by telephone. I also discussed with the patient that there may be a patient responsible charge related to this service. The patient expressed understanding and agreed to proceed. ? ?Patient Location: Home ?Provider Location: [facility] (Office) ? ?Participants in virtual visit: ?- Patient: Brandi [name] ?- CMA: Orinda Kenner, CMA ?- Provider: Dr Parks Ranger ? ?---------------------------------------------------------------------- ?Chief Complaint  ?Patient presents with  ? Emesis  ? ? ?S: Reviewed CMA documentation. I have called patient and gathered additional HPI as follows: ? ?Nausea Vomiting ?Diarrhea ?Reports that symptoms started 2 days ago with sudden onset nausea vomiting, clear emesis large amount. No blood in. She has been trying to stay hydrated with some fluids, not eating much. Had a piece of toast this morning. She had diarrhea as well. No blood or other symptoms.  Admits some mild fever initially. ?Tried OTC Pepto Bismol ? ?Denies any known or suspected exposure to person with or possibly with COVID19. ? ?No further diarrhea nausea vomiting ? ?Denies any active fevers, chills, sweats, body ache, cough, shortness of breath, sinus pain or pressure, headache, abdominal pain. ? ?Past Medical History:  ?Diagnosis Date  ? Hyperlipidemia   ? ?Social History  ? ?Tobacco Use  ? Smoking status: Former  ?  Packs/day: 0.75  ?  Years: 41.00  ?  Pack years: 30.75  ?  Types: E-cigarettes, Cigarettes  ?  Quit date: 12/08/2015  ?  Years since quitting: 6.2  ? Smokeless tobacco:  Former  ?Vaping Use  ? Vaping Use: Never used  ?Substance Use Topics  ? Alcohol use: No  ? Drug use: No  ? ? ?Current Outpatient Medications:  ?  ondansetron (ZOFRAN-ODT) 4 MG disintegrating tablet, Take 1 tablet (4 mg total) by mouth every 8 (eight) hours as needed for nausea or vomiting., Disp: 30 tablet, Rfl: 0 ?  albuterol (PROVENTIL) (2.5 MG/3ML) 0.083% nebulizer solution, Take 3 mLs (2.5 mg total) by nebulization every 6 (six) hours as needed for wheezing or shortness of breath., Disp: 75 mL, Rfl: 12 ?  albuterol (VENTOLIN HFA) 108 (90 Base) MCG/ACT inhaler, Inhale 2 puffs into the lungs every 4 (four) hours as needed for wheezing or shortness of breath., Disp: 8 g, Rfl: 3 ?  alendronate (FOSAMAX) 70 MG tablet, TAKE ONE TABLET BY MOUTH EACH WEEK, ON AN EMPTY STOMACH BEFORE BREAKFAST WITH 8oz OF WATER AND REMAIN UPRIGHT FOR :30, Disp: 4 tablet, Rfl: 11 ?  alum & mag hydroxide-simeth (MAALOX/MYLANTA) 200-200-20 MG/5ML suspension, Take 30 mLs by mouth every 4 (four) hours as needed for indigestion or heartburn., Disp: 355 mL, Rfl: 0 ?  aspirin EC 81 MG tablet, Take 1 tablet (81 mg total) by mouth daily., Disp: 90 tablet, Rfl: 3 ?  BREZTRI AEROSPHERE 160-9-4.8 MCG/ACT AERO, USE 2 INHALATION MY MOIUTH TWICE A DAY RINSE MOUTH AFTER EACH USE, Disp: 10.7 g, Rfl: 3 ?  Budeson-Glycopyrrol-Formoterol (BREZTRI AEROSPHERE) 160-9-4.8 MCG/ACT AERO, Inhale 2 puffs into the lungs in the morning and at bedtime., Disp: 5.9 g, Rfl: 0 ?  busPIRone (BUSPAR) 5 MG tablet, TAKE 1 TABLET BY MOUTH TWICE DAILY AS  NEEDED FOR ANXIETY, Disp: 60 tablet, Rfl: 2 ?  Calcium Carbonate-Vit D-Min (CALCIUM 1200) 1200-1000 MG-UNIT CHEW, Chew 1,200 mg by mouth daily., Disp: 30 each, Rfl: 11 ?  Cholecalciferol (VITAMIN D3) 125 MCG (5000 UT) TABS, Take by mouth. Vitamin D3 5,000 IU daily for 12 weeks then reduce to OTC Vitamin D3 2,000 IU daily for maintenance, Disp: , Rfl:  ?  ezetimibe (ZETIA) 10 MG tablet, Take 1 tablet (10 mg total) by mouth  daily., Disp: 90 tablet, Rfl: 3 ?  famotidine (PEPCID) 20 MG tablet, Take 1 tablet (20 mg total) by mouth 2 (two) times daily. (Patient taking differently: Take 20 mg by mouth 2 (two) times daily as needed.), Disp: 180 tablet, Rfl: 3 ?  feeding supplement (ENSURE ENLIVE / ENSURE PLUS) LIQD, Take 237 mLs by mouth 2 (two) times daily between meals., Disp: 237 mL, Rfl: 12 ?  Multiple Vitamin (MULTIVITAMIN WITH MINERALS) TABS tablet, Take 1 tablet by mouth daily., Disp: , Rfl:  ?  omeprazole (PRILOSEC) 20 MG capsule, TAKE 1 CAPSULE BY MOUTH ONCE DAILY, Disp: 90 capsule, Rfl: 0 ?  OXYGEN, Inhale into the lungs at bedtime as needed., Disp: , Rfl:  ?  predniSONE (DELTASONE) 20 MG tablet, Take daily with food. Start with 60mg  (3 pills) x 2 days, then reduce to 40mg  (2 pills) x 2 days, then 20mg  (1 pill) x 3 days, Disp: 13 tablet, Rfl: 0 ?  rosuvastatin (CRESTOR) 10 MG tablet, Take 1 tablet (10 mg total) by mouth daily., Disp: 90 tablet, Rfl: 3 ?  sodium chloride (OCEAN) 0.65 % SOLN nasal spray, Place 1 spray into both nostrils as needed for congestion., Disp: , Rfl:  ?  Specialty Vitamins Products (HAIR NOURISHING SUPPLEMENT PO), Take 1 tablet by mouth daily., Disp: , Rfl:  ? ? ?  02/12/2022  ? 10:42 AM 08/12/2021  ? 11:07 AM 07/08/2021  ? 10:40 AM  ?Depression screen PHQ 2/9  ?Decreased Interest 0 0 0  ?Down, Depressed, Hopeless 0 0 0  ?PHQ - 2 Score 0 0 0  ?Altered sleeping 0 0   ?Tired, decreased energy 0 0   ?Change in appetite 0 0   ?Feeling bad or failure about yourself  0 0   ?Trouble concentrating 0 0   ?Moving slowly or fidgety/restless 0 0   ?Suicidal thoughts 0 0   ?PHQ-9 Score 0 0   ?Difficult doing work/chores Not difficult at all Not difficult at all   ? ? ? ?  02/12/2022  ? 10:42 AM 08/12/2021  ? 11:07 AM 05/06/2021  ? 11:08 AM 02/20/2021  ? 10:33 AM  ?GAD 7 : Generalized Anxiety Score  ?Nervous, Anxious, on Edge 0 0 0 1  ?Control/stop worrying 0 0 0 1  ?Worry too much - different things 0 0 0 1  ?Trouble relaxing 0 0 0  1  ?Restless 0 0 0 0  ?Easily annoyed or irritable 0 0 0 0  ?Afraid - awful might happen 0 0 0 0  ?Total GAD 7 Score 0 0 0 4  ?Anxiety Difficulty Not difficult at all Not difficult at all Not difficult at all Not difficult at all  ? ? ?-------------------------------------------------------------------------- ?O: No physical exam performed due to remote telephone encounter. ? ?Lab results reviewed. ? ?Recent Results (from the past 2160 hour(s))  ?D-dimer, quantitative     Status: None  ? Collection Time: 12/11/21  3:57 PM  ?Result Value Ref Range  ? D-Dimer, Quant <0.27 0.00 - 0.50  ug/mL-FEU  ?  Comment: (NOTE) ?At the manufacturer cut-off value of 0.5 ?g/mL FEU, this assay has a ?negative predictive value of 95-100%.This assay is intended for use ?in conjunction with a clinical pretest probability (PTP) assessment ?model to exclude pulmonary embolism (PE) and deep venous thrombosis ?(DVT) in outpatients suspected of PE or DVT. ?Results should be correlated with clinical presentation. ?Performed at [facility], [place], ?[place] [address] ?  ?Lipid panel     Status: None  ? Collection Time: 12/22/21  9:52 AM  ?Result Value Ref Range  ? Cholesterol, Total 165 100 - 199 mg/dL  ? Triglycerides 64 0 - 149 mg/dL  ? HDL 63 >39 mg/dL  ? VLDL Cholesterol Cal 13 5 - 40 mg/dL  ? LDL Chol Calc (NIH) 89 0 - 99 mg/dL  ? Chol/HDL Ratio 2.6 0.0 - 4.4 ratio  ?  Comment:                                   T. Chol/HDL Ratio ?                                            Men  Women ?                              1/2 Avg.Risk  3.4    3.3 ?                                  Avg.Risk  5.0    4.4 ?                               2X Avg.Risk  9.6    7.1 ?                               3X Avg.Risk 23.4   11.0 ?  ?NM Myocar Multi W/Spect W/Wall Motion / EF     Status: None  ? Collection Time: 12/26/21 11:09 AM  ?Result Value Ref Range  ? Rest HR 74.0 bpm  ? Rest BP 134/79 mmHg  ? Peak HR 113 bpm  ? Peak BP 146/72 mmHg   ? Percent HR 73.0 %  ? ST Depression (mm) 0 mm  ? Rest Nuclear Isotope Dose 9.6 mCi  ? Stress Nuclear Isotope Dose 30.4 mCi  ? SSS 2.0   ? SRS 0.0   ? SDS 0.0   ? TID 0.86   ? LV sys vol 13.0 mL  ? LV dias vol

## 2022-03-11 NOTE — Patient Instructions (Addendum)
Thank you for coming to the office today. ? ?Zofran ODT ordered ? ?Imodium as needed for diarrhea. ? ?Viral Gastroenteritis, Adult ?Viral gastroenteritis is also known as the stomach flu. This condition may affect your stomach, small intestine, and large intestine. It can cause sudden watery diarrhea, fever, and vomiting. This condition is caused by many different viruses. These viruses can be passed from person to person very easily (are contagious). ?Diarrhea and vomiting can make you feel weak and cause you to become dehydrated. You may not be able to keep fluids down. Dehydration can make you tired and thirsty, cause you to have a dry mouth, and decrease how often you urinate. It is important to replace the fluids that you lose from diarrhea and vomiting. ?What are the causes? ?Gastroenteritis is caused by many viruses, including rotavirus and norovirus. Norovirus is the most common cause in adults. You can get sick after being exposed to the viruses from other people. You can also get sick by: ?Eating food, drinking water, or touching a surface contaminated with one of these viruses. ?Sharing utensils or other personal items with an infected person. ?What increases the risk? ?You are more likely to develop this condition if you: ?Have a weak body defense system (immune system). ?Live with one or more children who are younger than 50 years old. ?Live in a nursing home. ?Travel on cruise ships. ?What are the signs or symptoms? ?Symptoms of this condition start suddenly 1-3 days after exposure to a virus. Symptoms may last for a few days or for as long as a week. Common symptoms include watery diarrhea and vomiting. Other symptoms include: ?Fever. ?Headache. ?Fatigue. ?Pain in the abdomen. ?Chills. ?Weakness. ?Nausea. ?Muscle aches. ?Loss of appetite. ?How is this diagnosed? ?This condition is diagnosed with a medical history and physical exam. You may also have a stool test to check for viruses or other  infections. ?How is this treated? ?This condition typically goes away on its own. The focus of treatment is to prevent dehydration and restore lost fluids (rehydration). This condition may be treated with: ?An oral rehydration solution (ORS) to replace important salts and minerals (electrolytes) in your body. Take this if told by your health care provider. This is a drink that is sold at pharmacies and retail stores. ?Medicines to help with your symptoms. ?Probiotic supplements to reduce symptoms of diarrhea. ?Fluids given through an IV, if dehydration is severe. ?Older adults and people with other diseases or a weak immune system are at higher risk for dehydration. ?Follow these instructions at home: ?Eating and drinking ? ?Take an ORS as told by your health care provider. ?Drink clear fluids in small amounts as you are able. Clear fluids include: ?Water. ?Ice chips. ?Diluted fruit juice. ?Low-calorie sports drinks. ?Drink enough fluid to keep your urine pale yellow. ?Eat small amounts of healthy foods every 3-4 hours as you are able. This may include whole grains, fruits, vegetables, lean meats, and yogurt. ?Avoid fluids that contain a lot of sugar or caffeine, such as energy drinks, sports drinks, and soda. ?Avoid spicy or fatty foods. ?Avoid alcohol. ?General instructions ?Wash your hands often, especially after having diarrhea or vomiting. If soap and water are not available, use hand sanitizer. ?Make sure that all people in your household wash their hands well and often. ?Take over-the-counter and prescription medicines only as told by your health care provider. ?Rest at home while you recover. ?Watch your condition for any changes. ?Take a warm bath to relieve any  burning or pain from frequent diarrhea episodes. ?Keep all follow-up visits as told by your health care provider. This is important. ?Contact a health care provider if you: ?Cannot keep fluids down. ?Have symptoms that get worse. ?Have new  symptoms. ?Feel light-headed or dizzy. ?Have muscle cramps. ?Get help right away if you: ?Have chest pain. ?Feel extremely weak or you faint. ?See blood in your vomit. ?Have vomit that looks like coffee grounds. ?Have bloody or black stools or stools that look like tar. ?Have a severe headache, a stiff neck, or both. ?Have a rash. ?Have severe pain, cramping, or bloating in your abdomen. ?Have trouble breathing or you are breathing very quickly. ?Have a fast heartbeat. ?Have skin that feels cold and clammy. ?Feel confused. ?Have pain when you urinate. ?Have signs of dehydration, such as: ?Dark urine, very little urine, or no urine. ?Cracked lips. ?Dry mouth. ?Sunken eyes. ?Sleepiness. ?Weakness. ?Summary ?Viral gastroenteritis is also known as the stomach flu. It can cause sudden watery diarrhea, fever, and vomiting. ?This condition can be passed from person to person very easily (is contagious). ?Take an ORS if told by your health care provider. This is a drink that is sold at pharmacies and retail stores. ?Wash your hands often, especially after having diarrhea or vomiting. If soap and water are not available, use hand sanitizer. ?This information is not intended to replace advice given to you by your health care provider. Make sure you discuss any questions you have with your health care provider. ?Document Revised: 05/12/2019 Document Reviewed: 09/28/2018 ?Elsevier Patient Education ? 2022 [employer]. ? ? ? ? ?Please schedule a Follow-up Appointment to: Return in about 1 week (around 03/18/2022), or if symptoms worsen or fail to improve, for viral gastroenteritis. ? ?If you have any other questions or concerns, please feel free to call the office or send a message through [place]. You may also schedule an earlier appointment if necessary. ? ?Additionally, you may be receiving a survey about your experience at our office within a few days to 1 week by e-mail or mail. We value your feedback. ? ?Nobie Putnam, DO ?[place] ?

## 2022-03-18 IMAGING — MG MM DIGITAL SCREENING BILAT W/ TOMO AND CAD
6 of 10 series · 6 of 30 positions shown · non-contrast
Comparison: Previous exam(s).

CLINICAL DATA: Screening.

EXAM:
DIGITAL SCREENING BILATERAL MAMMOGRAM WITH TOMOSYNTHESIS AND CAD
TECHNIQUE: Bilateral screening digital craniocaudal and mediolateral oblique
mammograms were obtained. Bilateral screening digital breast
tomosynthesis was performed. The images were evaluated with
computer-aided detection.

[L MLO synth-2D]
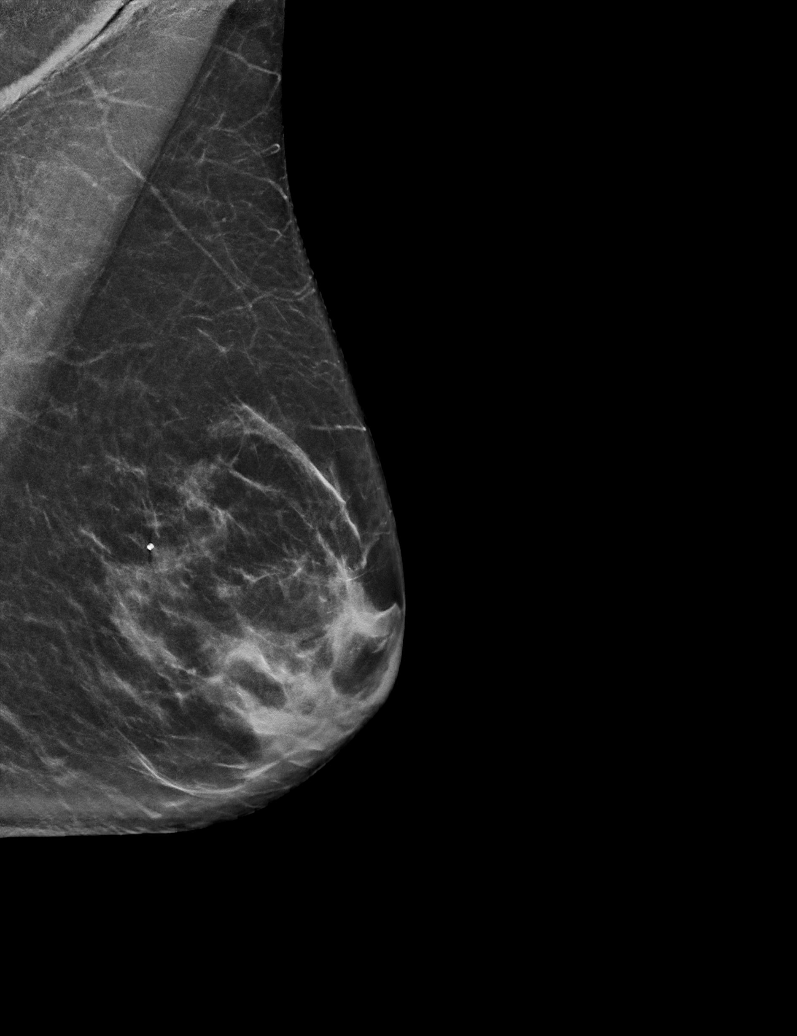

[L CC synth-2D (1 of 2)]
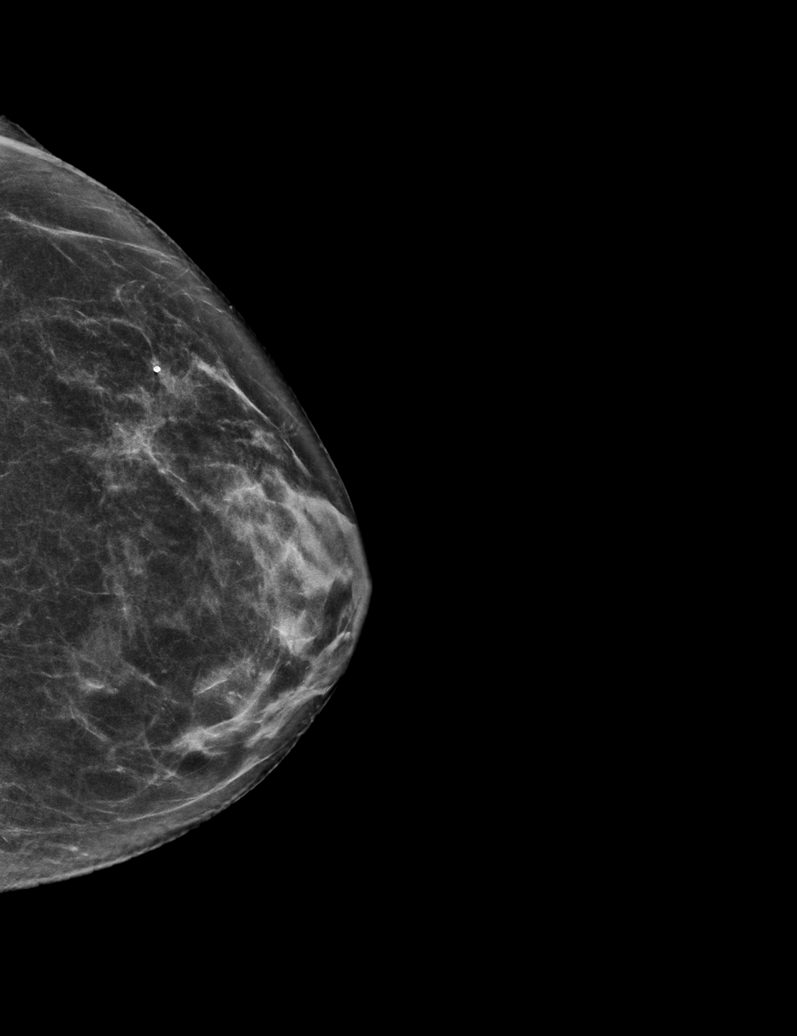

[R MLO synth-2D]
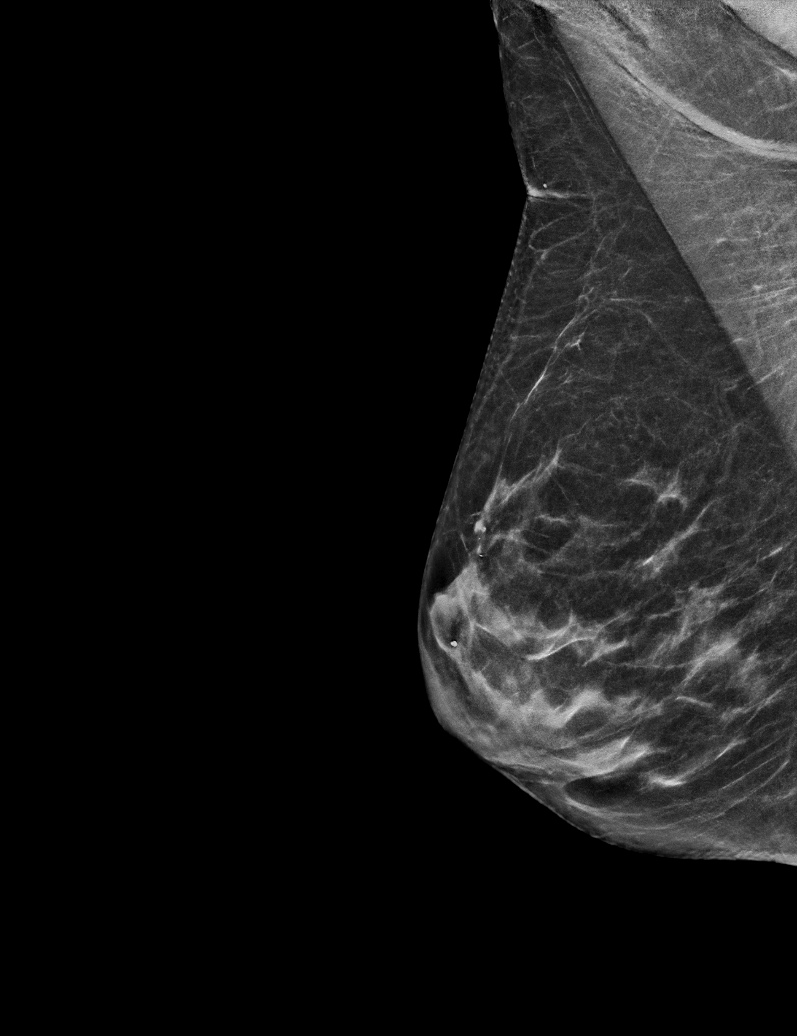

[L CC synth-2D (2 of 2)]
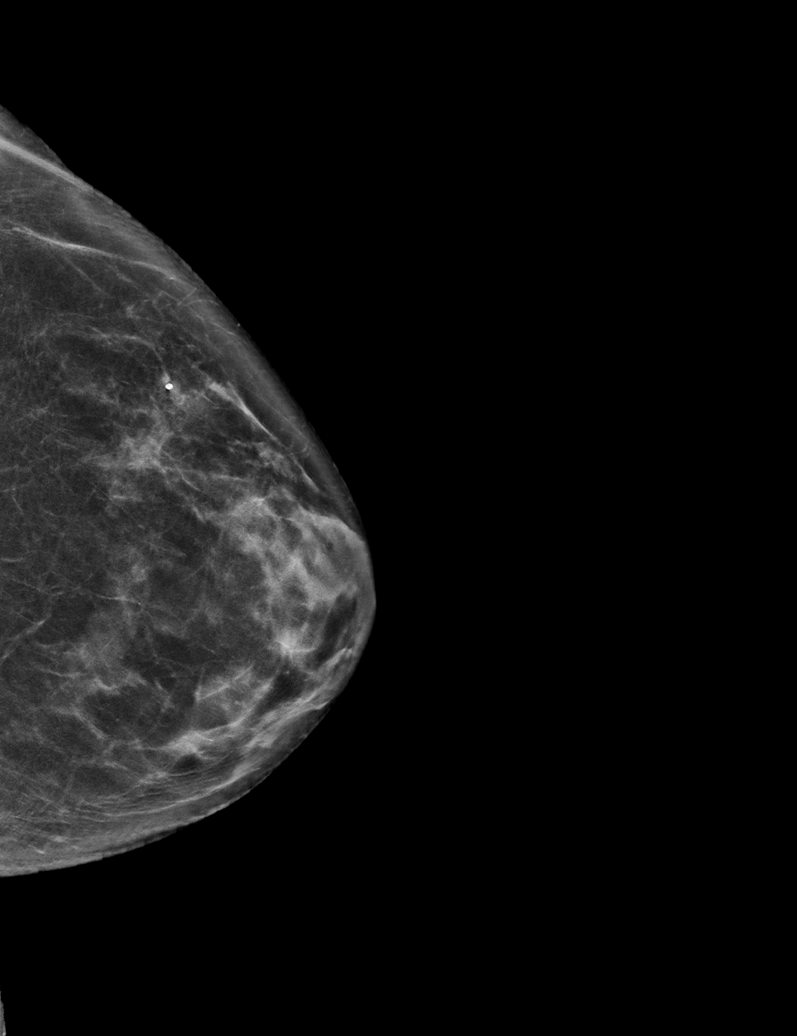

[R CC synth-2D]
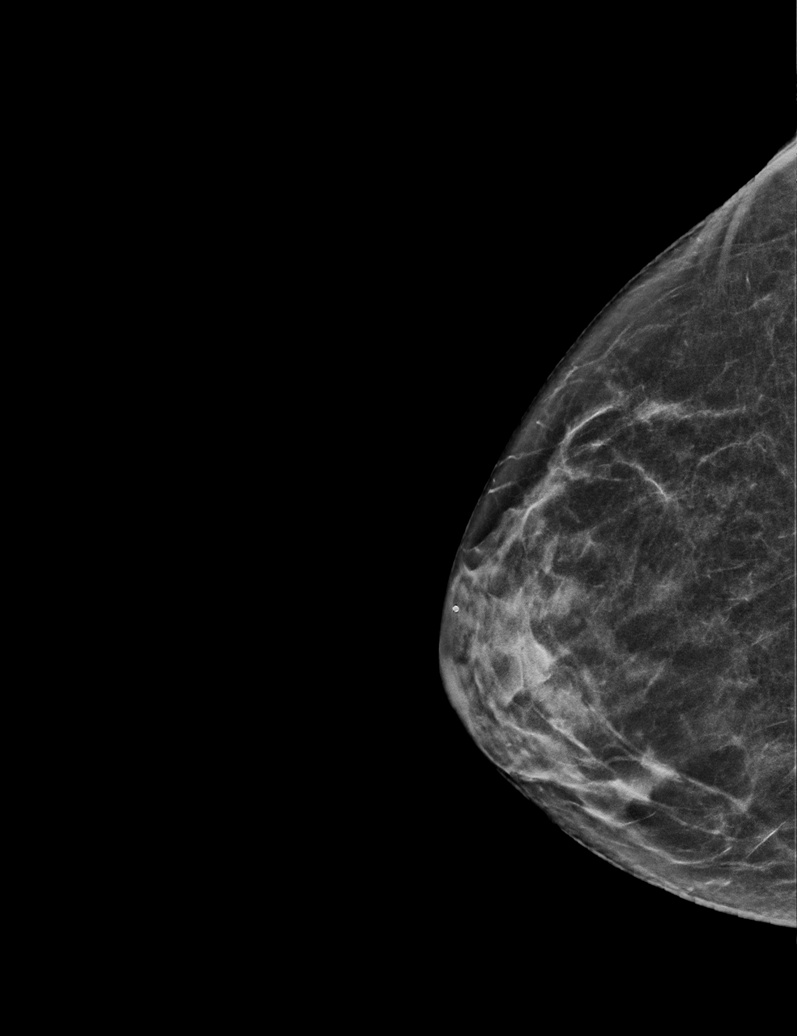

[L MLO tomo · tomo slice 33/66.0]
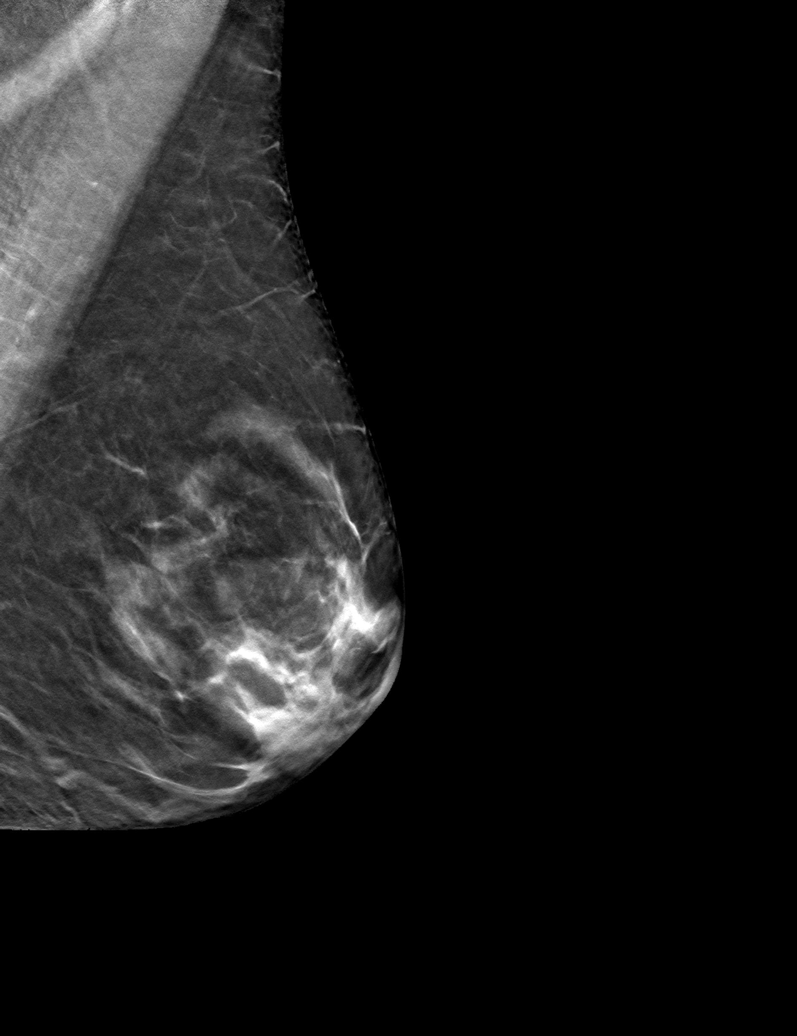

[6 of 30 positions shown; findings below may reference images not displayed]

ACR Breast Density Category b: There are scattered areas of
fibroglandular density.
FINDINGS: There are no findings suspicious for malignancy.
IMPRESSION: No mammographic evidence of malignancy. A result letter of this
screening mammogram will be mailed directly to the patient.

RECOMMENDATION:
Screening mammogram in one year. (Code:51-O-LD2)

BI-RADS CATEGORY  1: Negative.

## 2022-03-19 DIAGNOSIS — U071 COVID-19: Secondary | ICD-10-CM | POA: Diagnosis not present

## 2022-03-19 DIAGNOSIS — J9601 Acute respiratory failure with hypoxia: Secondary | ICD-10-CM | POA: Diagnosis not present

## 2022-03-23 ENCOUNTER — Other Ambulatory Visit: Payer: Self-pay | Admitting: Internal Medicine

## 2022-03-23 DIAGNOSIS — J9601 Acute respiratory failure with hypoxia: Secondary | ICD-10-CM | POA: Diagnosis not present

## 2022-03-23 DIAGNOSIS — U071 COVID-19: Secondary | ICD-10-CM | POA: Diagnosis not present

## 2022-03-24 NOTE — Telephone Encounter (Signed)
Refused Prilosec 20 mg because requested too early. ?

## 2022-03-25 ENCOUNTER — Ambulatory Visit (INDEPENDENT_AMBULATORY_CARE_PROVIDER_SITE_OTHER): Payer: No Typology Code available for payment source | Admitting: Pharmacist

## 2022-03-25 DIAGNOSIS — J432 Centrilobular emphysema: Secondary | ICD-10-CM

## 2022-03-25 DIAGNOSIS — E782 Mixed hyperlipidemia: Secondary | ICD-10-CM

## 2022-03-25 NOTE — Chronic Care Management (AMB) (Signed)
? ?Chronic Care Management ?CCM Pharmacy Note ? ?03/25/2022 ?Name:  Idaly Verret MRN:  [id number] DOB:  Feb 20, 1956 ? ? ?Subjective: ?Chyrel Taha is an 66 y.o. year old female who is a primary patient of [name], DO.  The CCM team was consulted for assistance with disease management and care coordination needs.   ? ?Engaged with patient by telephone for follow up visit for pharmacy case management and/or care coordination services.  ? ?Objective: ? ?Medications Reviewed Today   ? ? Reviewed by [name], RPH-CPP (Pharmacist) on 03/25/22 at 1159  Med List Status: <None>  ? ?Medication Order Taking? Sig Documenting Provider Last Dose Status Informant  ?albuterol (PROVENTIL) (2.5 MG/3ML) 0.083% nebulizer solution [id number] Yes Take 3 mLs (2.5 mg total) by nebulization every 6 (six) hours as needed for wheezing or shortness of breath. [name], MD Taking Active   ?albuterol (VENTOLIN HFA) 108 (90 Base) MCG/ACT inhaler [id number] Yes Inhale 2 puffs into the lungs every 4 (four) hours as needed for wheezing or shortness of breath. [name], MD Taking Active   ?alendronate (FOSAMAX) 70 MG tablet [id number] Yes TAKE ONE TABLET BY MOUTH EACH WEEK, ON AN EMPTY STOMACH BEFORE BREAKFAST WITH 8oz OF WATER AND REMAIN UPRIGHT FOR :30 [name], Jaquelyn Bitter, MD Taking Active   ?alum & mag hydroxide-simeth (MAALOX/MYLANTA) 200-200-20 MG/5ML suspension [id number]  Take 30 mLs by mouth every 4 (four) hours as needed for indigestion or heartburn. [name], DO  Active   ?aspirin EC 81 MG tablet [id number] Yes Take 1 tablet (81 mg total) by mouth daily. [name], MD Taking Active Self  ?BREZTRI AEROSPHERE 160-9-4.8 MCG/ACT AERO [id number] Yes USE 2 INHALATION MY MOIUTH TWICE A DAY RINSE MOUTH AFTER EACH USE [name], DO Taking Active   ?Budeson-Glycopyrrol-Formoterol (BREZTRI AEROSPHERE) 160-9-4.8 MCG/ACT AERO [id number]  Inhale 2 puffs into the lungs in the  morning and at bedtime. [name], MD  Active   ?busPIRone (BUSPAR) 5 MG tablet [id number]  TAKE 1 TABLET BY MOUTH TWICE DAILY AS NEEDED FOR ANXIETY Althea Charon [name], DO  Active   ?Calcium Carbonate-Vit D-Min (CALCIUM 1200) 1200-1000 MG-UNIT CHEW [id number]  Chew 1,200 mg by mouth daily. [name], MD  Active   ?Cholecalciferol (VITAMIN D3) 125 MCG (5000 UT) TABS [id number]  Take by mouth. Vitamin D3 5,000 IU daily for 12 weeks then reduce to OTC Vitamin D3 2,000 IU daily for maintenance [provider]  Active   ?ezetimibe (ZETIA) 10 MG tablet [id number] Yes Take 1 tablet (10 mg total) by mouth daily. [name], MD Taking Active   ?famotidine (PEPCID) 20 MG tablet [id number]  Take 1 tablet (20 mg total) by mouth 2 (two) times daily.  ?Patient taking differently: Take 20 mg by mouth 2 (two) times daily as needed.  ? [name], DO  Active   ?feeding supplement (ENSURE ENLIVE / ENSURE PLUS) LIQD [id number]  Take 237 mLs by mouth 2 (two) times daily between meals. [name], DO  Active   ?Multiple Vitamin (MULTIVITAMIN WITH MINERALS) TABS tablet [id number]  Take 1 tablet by mouth daily. [name], DO  Active   ?omeprazole (PRILOSEC) 20 MG capsule [id number] Yes TAKE 1 CAPSULE BY MOUTH ONCE DAILY Baity, Salvadore Oxford, NP Taking Active   ?ondansetron (ZOFRAN-ODT) 4 MG disintegrating tablet [id number]  Take 1 tablet (4 mg total) by mouth every 8 (eight) hours as needed for nausea or vomiting. Althea Charon, [name],  DO  Active   ?OXYGEN [id number]  Inhale into the lungs at bedtime as needed. [provider]  Active   ?rosuvastatin (CRESTOR) 10 MG tablet [id number] Yes Take 1 tablet (10 mg total) by mouth daily. [name], MD Taking Active   ?sodium chloride (OCEAN) 0.65 % SOLN nasal spray [id number]  Place 1 spray into both nostrils as needed for congestion. [provider]  Active   ?Specialty Vitamins Products (HAIR NOURISHING  SUPPLEMENT PO) [id number]  Take 1 tablet by mouth daily. [provider]  Active   ? ?  ?  ? ?  ? ? ?Pertinent Labs:  ?Lab Results  ?Component Value Date  ? HGBA1C 5.8 (H) 01/20/2021  ? ?Lab Results  ?Component Value Date  ? CHOL 165 12/22/2021  ? HDL 63 12/22/2021  ? LDLCALC 89 12/22/2021  ? TRIG 64 12/22/2021  ? CHOLHDL 2.6 12/22/2021  ? ?Lab Results  ?Component Value Date  ? CREATININE 0.87 11/17/2021  ? BUN 12 11/17/2021  ? NA 140 11/17/2021  ? K 4.2 11/17/2021  ? CL 102 11/17/2021  ? CO2 24 11/17/2021  ? ? ?SDOH:  (Social Determinants of Health) assessments and interventions performed:  ? ? ?CCM Care Plan ? ?Review of patient past medical history, allergies, medications, health status, including review of consultants reports, laboratory and other test data, was performed as part of comprehensive evaluation and provision of chronic care management services.  ? ?Care Plan : PharmD - Medication Assistance/Adherence  ?Updates made by [name], RPH-CPP since 03/25/2022 12:00 AM  ?  ? ?Problem: Disease Progression   ?  ? ?Long-Range Goal: Disease Progression Prevented or Minimized   ?Start Date: 04/30/2021  ?Expected End Date: 07/29/2021  ?Recent Progress: On track  ?Priority: High  ?Note:   ?Current Barriers:  ?Unable to independently afford treatment regimen ?Reports cost of Breztri inhaler unaffordable through [facility] health plan ? ?Pharmacist Clinical Goal(s):  ?Over the next 90 days, patient will verbalize ability to afford treatment regimen through collaboration with PharmD and provider.  ? ?Interventions: ?1:1 collaboration with [name], DO regarding development and update of comprehensive plan of care as evidenced by provider attestation and co-signature ?Inter-disciplinary care team collaboration (see longitudinal plan of care) ?Perform chart review ?Patient had Video Visit on 03/11/2022 related to nausea/vomiting/diarrhea ?Office Visit with PCP on 3/9  ?Today reports  nausea/vomiting/diarrhea resolved ?Reports currently having difficulty with a bill from DME company, but working with her insurance [employer] to resolve this ?Patient declines need for care guide referral ? ?Hyperlipidemia: ?Current treatment: ?Ezetimibe 10 mg daily ?Rosuvastatin 10 mg daily ?Previous therapies tried: rosuvastatin 20 mg daily (numbness/tingling) ?Note patient uses weekly pillbox to organize her medications/adherence aid ? ?COPD: ?Current treatment: ?Breztri inhaler - 2 puffs in morning and at bedtime ?Albuterol inhaler - 1 puffs every 4 hours as needed ?Overnight oxygen ?Using nasal saline as directed ?Reports using Breztri inhaler twice daily (and rinses out mouth after use) and use albuterol inhaler as needed as directed ?Unable to afford Pulmonary Rehab at this time (copayment: $15/visit) ? ?Medication Assistance: ?Based on patient report, meets income requirement for Extra Help through [employer], but is concerned that would not qualify based on other resources (money received from spouse's life insurance) ?Based on reported income, patient would qualify for patient assistance for Breztri inhaler from [employer], but program would require patient to have completed Extra Help application ?Again counsel patient on options for completing Extra Help application online  or paper application from [employer] office.  ?Again offer to assist patient with Extra Help application over the phone. Patient declines, but states will call me back if would like further help ?Patient not interested in working on patient assistance or extra help applications at this time. Instead paying tier 3 copayment, [facility] ? ?Medication Adherence: ?Patient uses weekly pillbox as adherence aid ? ? ?Patient Goals/Self-Care Activities ?Over the next 90 days, patient will:  ?- take medications as prescribed ?- collaborate with provider on medication access solutions ?- attend  medical appointments as scheduled ? Appointment with Pulmonologist on 5/17 ? ?Follow Up Plan: Telephone follow up appointment with care management team member scheduled for: 05/06/2022 at 11:15 AM ? ?  ?  ? ?Vallery SaElisabeth D

## 2022-03-25 NOTE — Patient Instructions (Signed)
Visit Information ? ?Thank you for taking time to visit with me today. Please don't hesitate to contact me if I can be of assistance to you before our next scheduled telephone appointment. ? ?Following are the goals we discussed today:  ? Goals Addressed   ? ?  ?  ?  ?  ? This Visit's Progress  ?  Pharmacy Goals     ?  Our goal bad cholesterol, or LDL, is less than 70. This is why it is important to continue taking your rosuvastatin and ezetimibe. ? ?Feel free to call me with any questions or concerns. I look forward to our next call! ? ?Estelle Grumbles, PharmD, BCACP, CPP ?Clinical Pharmacist ?[facility] ?[place] ?[phone] ?  ? ?  ? ? ? ?Our next appointment is by telephone on 05/06/2022 at 11:15 AM ? ?Please call the care guide team at [phone] if you need to cancel or reschedule your appointment.  ? ? ?Patient verbalizes understanding of instructions and care plan provided today and agrees to view in MyChart. Active MyChart status confirmed with patient.   ? ?

## 2022-04-02 DIAGNOSIS — J9601 Acute respiratory failure with hypoxia: Secondary | ICD-10-CM | POA: Diagnosis not present

## 2022-04-02 DIAGNOSIS — U071 COVID-19: Secondary | ICD-10-CM | POA: Diagnosis not present

## 2022-04-05 DIAGNOSIS — E782 Mixed hyperlipidemia: Secondary | ICD-10-CM | POA: Diagnosis not present

## 2022-04-05 DIAGNOSIS — J432 Centrilobular emphysema: Secondary | ICD-10-CM | POA: Diagnosis not present

## 2022-04-06 ENCOUNTER — Other Ambulatory Visit: Payer: Self-pay | Admitting: Internal Medicine

## 2022-04-07 ENCOUNTER — Telehealth: Payer: No Typology Code available for payment source

## 2022-04-07 NOTE — Telephone Encounter (Signed)
Requested Prescriptions  ?Pending Prescriptions Disp Refills  ?? omeprazole (PRILOSEC) 20 MG capsule [Pharmacy Med Name: OMEPRAZOLE DR 20 MG CAP] 90 capsule 1  ?  Sig: TAKE 1 CAPSULE BY MOUTH ONCE DAILY  ?  ? Gastroenterology: Proton Pump Inhibitors Passed - 04/06/2022  1:21 PM  ?  ?  Passed - Valid encounter within last 12 months  ?  Recent Outpatient Visits   ?      ? 3 weeks ago Viral gastroenteritis  ? [facility] [name], DO  ? 1 month ago COPD with acute exacerbation [name])  ? [facility] [place], [name], DO  ? 4 months ago Dyspnea on exertion  ? [facility] [place], [place], NP  ? 7 months ago Centrilobular emphysema [facility])  ? [facility] [place], [name], DO  ? 11 months ago Centrilobular emphysema [facility])  ? [facility] [name], DO  ?  ?  ?Future Appointments   ?        ? In 3 months [facility], [place]   ?  ? ?  ?  ?  ? ?

## 2022-04-21 ENCOUNTER — Ambulatory Visit (INDEPENDENT_AMBULATORY_CARE_PROVIDER_SITE_OTHER): Payer: No Typology Code available for payment source | Admitting: Licensed Clinical Social Worker

## 2022-04-21 DIAGNOSIS — F4323 Adjustment disorder with mixed anxiety and depressed mood: Secondary | ICD-10-CM

## 2022-04-21 DIAGNOSIS — J441 Chronic obstructive pulmonary disease with (acute) exacerbation: Secondary | ICD-10-CM

## 2022-04-21 NOTE — Patient Instructions (Signed)
Visit Information ? ?Thank you for taking time to visit with me today. Please don't hesitate to contact me if I can be of assistance to you before our next scheduled telephone appointment. ? ?Following are the goals we discussed today:  ?Patient Goals/Self-Care Activities: Over the next 120 days ?Continue compliance with medication management ?Attend all scheduled appointments with providers ?Contact office with any questions or concerns ?Follow up Pulmonology about affording oxygen ?Follow plan to deal with triggers  ? ?Our next appointment is by telephone on 06/30/22 at 10:45 AM ? ?Please call the care guide team at [phone] if you need to cancel or reschedule your appointment.  ? ?If you are experiencing a Mental Health or Behavioral Health Crisis or need someone to talk to, please call the Suicide and Crisis Lifeline: 988 ?call 911  ? ?Patient verbalizes understanding of instructions and care plan provided today and agrees to view in MyChart. Active MyChart status confirmed with patient.   ? ?Jenel Lucks, MSW, LCSW ?[facility] Care Management ?[place]  [facility] ?[email or web address] ?Phone [phone] ?5:35 PM ? ?

## 2022-04-21 NOTE — Chronic Care Management (AMB) (Signed)
?Chronic Care Management  ? ? Clinical Social Work Note ? ?04/21/2022 ?Name: Brandi [name] MRN: [id number] DOB: 18-Apr-1956 ? ?Brandi [name] is a 66 y.o. year old female who is a primary care patient of [name], DO. The CCM team was consulted to assist the patient with chronic disease management and/or care coordination needs related to: [employer]  and Mental Health Counseling and Resources.  ? ?Engaged with patient by telephone for follow up visit in response to provider referral for social work chronic care management and care coordination services.  ? ?Consent to Services:  ?The patient was given information about Chronic Care Management services, agreed to services, and gave verbal consent prior to initiation of services.  Please see initial visit note for detailed documentation.  ? ?Patient agreed to services and consent obtained.  ? ?Assessment: Review of patient past medical history, allergies, medications, and health status, including review of relevant consultants reports was performed today as part of a comprehensive evaluation and provision of chronic care management and care coordination services.    ? ?SDOH (Social Determinants of Health) assessments and interventions performed:   ? ?Advanced Directives Status: Not addressed in this encounter. ? ?CCM Care Plan ? ?No Known Allergies ? ?Outpatient Encounter Medications as of 04/21/2022  ?Medication Sig  ? albuterol (PROVENTIL) (2.5 MG/3ML) 0.083% nebulizer solution Take 3 mLs (2.5 mg total) by nebulization every 6 (six) hours as needed for wheezing or shortness of breath.  ? albuterol (VENTOLIN HFA) 108 (90 Base) MCG/ACT inhaler Inhale 2 puffs into the lungs every 4 (four) hours as needed for wheezing or shortness of breath.  ? alendronate (FOSAMAX) 70 MG tablet TAKE ONE TABLET BY MOUTH EACH WEEK, ON AN EMPTY STOMACH BEFORE BREAKFAST WITH 8oz OF WATER AND REMAIN UPRIGHT FOR :30  ? alum & mag hydroxide-simeth (MAALOX/MYLANTA)  200-200-20 MG/5ML suspension Take 30 mLs by mouth every 4 (four) hours as needed for indigestion or heartburn.  ? aspirin EC 81 MG tablet Take 1 tablet (81 mg total) by mouth daily.  ? BREZTRI AEROSPHERE 160-9-4.8 MCG/ACT AERO USE 2 INHALATION MY MOIUTH TWICE A DAY RINSE MOUTH AFTER EACH USE  ? Budeson-Glycopyrrol-Formoterol (BREZTRI AEROSPHERE) 160-9-4.8 MCG/ACT AERO Inhale 2 puffs into the lungs in the morning and at bedtime.  ? busPIRone (BUSPAR) 5 MG tablet TAKE 1 TABLET BY MOUTH TWICE DAILY AS NEEDED FOR ANXIETY  ? Calcium Carbonate-Vit D-Min (CALCIUM 1200) 1200-1000 MG-UNIT CHEW Chew 1,200 mg by mouth daily.  ? Cholecalciferol (VITAMIN D3) 125 MCG (5000 UT) TABS Take by mouth. Vitamin D3 5,000 IU daily for 12 weeks then reduce to OTC Vitamin D3 2,000 IU daily for maintenance  ? ezetimibe (ZETIA) 10 MG tablet Take 1 tablet (10 mg total) by mouth daily.  ? famotidine (PEPCID) 20 MG tablet Take 1 tablet (20 mg total) by mouth 2 (two) times daily. (Patient taking differently: Take 20 mg by mouth 2 (two) times daily as needed.)  ? feeding supplement (ENSURE ENLIVE / ENSURE PLUS) LIQD Take 237 mLs by mouth 2 (two) times daily between meals.  ? Multiple Vitamin (MULTIVITAMIN WITH MINERALS) TABS tablet Take 1 tablet by mouth daily.  ? omeprazole (PRILOSEC) 20 MG capsule TAKE 1 CAPSULE BY MOUTH ONCE DAILY  ? ondansetron (ZOFRAN-ODT) 4 MG disintegrating tablet Take 1 tablet (4 mg total) by mouth every 8 (eight) hours as needed for nausea or vomiting.  ? OXYGEN Inhale into the lungs at bedtime as needed.  ? rosuvastatin (CRESTOR) 10 MG tablet Take 1  tablet (10 mg total) by mouth daily.  ? sodium chloride (OCEAN) 0.65 % SOLN nasal spray Place 1 spray into both nostrils as needed for congestion.  ? Specialty Vitamins Products (HAIR NOURISHING SUPPLEMENT PO) Take 1 tablet by mouth daily.  ? ?No facility-administered encounter medications on file as of 04/21/2022.  ? ? ?Patient Active Problem List  ? Diagnosis Date Noted  ?  Pre-diabetes 04/08/2021  ? Vitamin D deficiency 04/08/2021  ? COVID-19 vaccination declined 12/04/2020  ? Centrilobular emphysema (HCC) 04/08/2020  ? Nodule of upper lobe of left lung 04/08/2020  ? Aortic atherosclerosis (HCC) 05/21/2019  ? Coronary artery calcification seen on CT scan 05/21/2019  ? Former smoker 05/21/2019  ? Hyperlipidemia 05/21/2019  ? ? ?Conditions to be addressed/monitored: COPD; Mental Health Concerns  ? ?Care Plan : General Social Work (Adult)  ?Updates made by Bridgett Larsson, LCSW since 04/21/2022 12:00 AM  ?  ? ?Problem: Coping Skills (General Plan of Care)   ?  ? ?Long-Range Goal: Coping Skills Enhanced   ?Start Date: 06/03/2021  ?Expected End Date: 08/06/2022  ?This Visit's Progress: On track  ?Recent Progress: On track  ?Priority: High  ?Note:   ?Current barriers:   ?Acute Mental Health needs related to grief ?Mental Health Concerns  ?Needs Support, Education, and Care Coordination in order to meet unmet mental health needs. ?Clinical Goal(s): Over the next 120 days, patient will work with SW, counselor and therapist to reduce or manage symptoms of agitation, mood instability, stress, and bipolar until connected for ongoing counseling. ?Clinical Interventions:  ?Assessed patient's previous and current treatment, coping skills, support system and barriers to care  ?Pt reports difficulty managing feelings of frustration regarding step-son filing for a stake in pt's land and/or home. Validation and encouragement was provided ?Mindfulness strategies were discussed and pt identified plan to utilize techniques when triggered in the future ?Patient reports difficulty maintaining payments (approx. $127 monthly) associated to having oxygen. Pt agreed to discuss resources with Pulmonologist at upcoming appt. CCM LCSW will complete referral to Care Guide to inquire about additional resources ?Patient continues to receive strong support from family and friends. She has a Clinical research associate to assist with pending  claim. Pt is looking forward to [place] trip to the mountains next month ?CCM LCSW discussed strategies to assist with establishing healthy boundaries. Patient was commended on strengthening her support system during this difficult time. ?CCM LCSW reviewed upcoming appointments ?Mindfulness or Relaxation training provided ?Active listening / Reflection utilized  ?Emotional Support Provided ?Behavioral Activation reviewed ?Provided psychoeducation for mental health needs  ?Reviewed mental health medications and discussed importance of compliance: Patient reports compliance with medication management ?Verbalization of feelings encouraged   ?Discussed plans with patient for ongoing care management follow up and provided patient with direct contact information for care management team ?Collaboration with PCP regarding development and update of comprehensive plan of care as evidenced by provider attestation and co-signature ?Inter-disciplinary care team collaboration (see longitudinal plan of care) ?Patient Goals/Self-Care Activities: Over the next 120 days ?Continue compliance with medication management ?Attend all scheduled appointments with providers ?Contact office with any questions or concerns ?Follow up Pulmonology about affording oxygen ?Follow plan to deal with triggers  ? ? ?  ?  ? ?Follow Up Plan: Appointment scheduled for SW follow up with client by phone on: 06/30/22 ? ?Jenel Lucks, MSW, LCSW ?[facility] Care Management ?[place]  [facility] ?[email or web address] ?Phone [phone] ?5:40 PM ? ? ? ?

## 2022-04-22 ENCOUNTER — Telehealth: Payer: Self-pay | Admitting: *Deleted

## 2022-04-22 ENCOUNTER — Ambulatory Visit (INDEPENDENT_AMBULATORY_CARE_PROVIDER_SITE_OTHER): Payer: No Typology Code available for payment source | Admitting: Pulmonary Disease

## 2022-04-22 ENCOUNTER — Encounter: Payer: Self-pay | Admitting: Pulmonary Disease

## 2022-04-22 VITALS — BP 120/80 | HR 95 | Temp 97.7°F | Ht 64.0 in | Wt 124.4 lb

## 2022-04-22 DIAGNOSIS — H547 Unspecified visual loss: Secondary | ICD-10-CM

## 2022-04-22 DIAGNOSIS — J439 Emphysema, unspecified: Secondary | ICD-10-CM

## 2022-04-22 DIAGNOSIS — J449 Chronic obstructive pulmonary disease, unspecified: Secondary | ICD-10-CM

## 2022-04-22 DIAGNOSIS — Z87891 Personal history of nicotine dependence: Secondary | ICD-10-CM

## 2022-04-22 DIAGNOSIS — R0602 Shortness of breath: Secondary | ICD-10-CM

## 2022-04-22 NOTE — Progress Notes (Signed)
Subjective:    Patient ID: Brandi [name], female    DOB: 11/18/1956, 66 y.o.   MRN: [id number] Patient Care Team: [name], DO as PCP - General (Family Medicine) [name], Jackelyn Poling, RPH-CPP (Pharmacist) Bridgett Larsson, LCSW as Social Worker (Licensed Clinical Social Worker)  Chief Complaint  Patient presents with   Follow-up    SOB with exertion.    HPI This is a 66 year old former smoker (52 PVY) who presents for follow-up on the issue of severe COPD.  Last visit here was 21 January 2022.  She was referred to pulmonary rehab at that time however, the patient did not follow through with the referral.  She is currently on Breztri 2 puffs twice a day and albuterol as needed.  She is doing well on this regard.  She continues to have some shortness of breath on exertion however it is not deemed severe at this point.  She has nocturnal hypoxemia associated with COPD.  She is compliant with oxygen at 2 L/min nocturnally.  She notes benefit of the therapy.  She does not endorse any new complaint today.  Dyspnea on exertion is about baseline.  She was encouraged to reach out to the pulmonary rehab team as I believe this will help her with her dyspnea on exertion.  The patient will consider this.  Her only complaint today is that of decreased visual acuity.  Recommended evaluation by ophthalmology/optometry.  She remains abstinent of cigarettes.   DATA 04/09/2021 LDCT: Moderate to severe paraseptal and centrilobular emphysema.  Left upper lobe nodule measuring 2 mm (decreased from prior), compression fractures T7 and T9 noted previously and unchanged.  Coronary artery calcifications. 07/04/2021 alpha-1 antitrypsin: Phenotype MM, level 140 mg/dL (normal) 40/98/1191 overnight oximetry: Baseline O2 sat 90% during sleep with desaturations as low as 83% during the night desaturation events lasted for over an hour. 08/14/2021 PFTs: FEV1 1.13 L or 46% predicted, FVC 2.42 L or 76%  predicted, FEV1/FVC 47%, there is a mild response to bronchodilator with a 10% net change.  There is hyperinflation with TLC at 128% and air trapping with RV at 188%.  Diffusion capacity is moderately to severely reduced by Kco.  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 08/14/2021 cardiogram: LVEF 60 to 65%, grade 1 DD 12/04/2021 chest x-ray: Emphysema with no evidence of acute cardiopulmonary disease 12/24/2021 overnight oximetry: Baseline O2 sat 90% desaturations as low as 83%.   12/26/2021 nuclear medicine stress test: No significant ischemia, normal wall motion, EF estimated at 77%, coronary calcification in the LAD, mild aortic atherosclerosis   Review of Systems A 10 point review of systems was performed and it is as noted above otherwise negative.  Patient Active Problem List   Diagnosis Date Noted   Pre-diabetes 04/08/2021   Vitamin D deficiency 04/08/2021   COVID-19 vaccination declined 12/04/2020   Centrilobular emphysema (HCC) 04/08/2020   Nodule of upper lobe of left lung 04/08/2020   Aortic atherosclerosis (HCC) 05/21/2019   Coronary artery calcification seen on CT scan 05/21/2019   Former smoker 05/21/2019   Hyperlipidemia 05/21/2019   Social History   Tobacco Use   Smoking status: Former    Packs/day: 0.75    Years: 41.00    Pack years: 30.75    Types: E-cigarettes, Cigarettes    Quit date: 12/08/2015    Years since quitting: 6.3   Smokeless tobacco: Former  Substance Use Topics   Alcohol use: No   No Known Allergies  Current  Meds  Medication Sig   albuterol (PROVENTIL) (2.5 MG/3ML) 0.083% nebulizer solution Take 3 mLs (2.5 mg total) by nebulization every 6 (six) hours as needed for wheezing or shortness of breath.   albuterol (VENTOLIN HFA) 108 (90 Base) MCG/ACT inhaler Inhale 2 puffs into the lungs every 4 (four) hours as needed for wheezing or shortness of breath.   alendronate (FOSAMAX) 70 MG tablet TAKE ONE TABLET BY MOUTH EACH WEEK, ON AN  EMPTY STOMACH BEFORE BREAKFAST WITH 8oz OF WATER AND REMAIN UPRIGHT FOR :30   alum & mag hydroxide-simeth (MAALOX/MYLANTA) 200-200-20 MG/5ML suspension Take 30 mLs by mouth every 4 (four) hours as needed for indigestion or heartburn.   aspirin EC 81 MG tablet Take 1 tablet (81 mg total) by mouth daily.   BREZTRI AEROSPHERE 160-9-4.8 MCG/ACT AERO USE 2 INHALATION MY MOIUTH TWICE A DAY RINSE MOUTH AFTER EACH USE   busPIRone (BUSPAR) 5 MG tablet TAKE 1 TABLET BY MOUTH TWICE DAILY AS NEEDED FOR ANXIETY   Calcium Carbonate-Vit D-Min (CALCIUM 1200) 1200-1000 MG-UNIT CHEW Chew 1,200 mg by mouth daily.   Cholecalciferol (VITAMIN D3) 125 MCG (5000 UT) TABS Take by mouth. Vitamin D3 5,000 IU daily for 12 weeks then reduce to OTC Vitamin D3 2,000 IU daily for maintenance   ezetimibe (ZETIA) 10 MG tablet Take 1 tablet (10 mg total) by mouth daily.   famotidine (PEPCID) 20 MG tablet Take 1 tablet (20 mg total) by mouth 2 (two) times daily. (Patient taking differently: Take 20 mg by mouth 2 (two) times daily as needed.)   feeding supplement (ENSURE ENLIVE / ENSURE PLUS) LIQD Take 237 mLs by mouth 2 (two) times daily between meals.   Multiple Vitamin (MULTIVITAMIN WITH MINERALS) TABS tablet Take 1 tablet by mouth daily.   omeprazole (PRILOSEC) 20 MG capsule TAKE 1 CAPSULE BY MOUTH ONCE DAILY   ondansetron (ZOFRAN-ODT) 4 MG disintegrating tablet Take 1 tablet (4 mg total) by mouth every 8 (eight) hours as needed for nausea or vomiting.   OXYGEN Inhale into the lungs at bedtime as needed.   rosuvastatin (CRESTOR) 10 MG tablet Take 1 tablet (10 mg total) by mouth daily.   sodium chloride (OCEAN) 0.65 % SOLN nasal spray Place 1 spray into both nostrils as needed for congestion.   Specialty Vitamins Products (HAIR NOURISHING SUPPLEMENT PO) Take 1 tablet by mouth daily.   [DISCONTINUED] Budeson-Glycopyrrol-Formoterol (BREZTRI AEROSPHERE) 160-9-4.8 MCG/ACT AERO Inhale 2 puffs into the lungs in the morning and at bedtime.    Immunization History  Administered Date(s) Administered   [employer] Vaccination 05/26/2021, 06/24/2021       Objective:   Physical Exam BP 120/80 (BP Location: Left Arm, Cuff Size: Normal)   Pulse 95   Temp 97.7 F (36.5 C) (Temporal)   Ht 5\' 4"  (1.626 m)   Wt 124 lb 6.4 oz (56.4 kg)   SpO2 93%   BMI 21.35 kg/m  GENERAL: Well-developed, well-nourished woman, no acute distress.  No conversational dyspnea. HEAD: Normocephalic, atraumatic.  EYES: Pupils equal, round, reactive to light.  No scleral icterus.  MOUTH: Poor dentition.  She does have visible nasal septal deviation. NECK: Supple. No thyromegaly. Trachea midline. No JVD.  No adenopathy. PULMONARY: Good air entry bilaterally.  Coarse, otherwise no adventitious sounds. CARDIOVASCULAR: S1 and S2. Regular rate and rhythm.  No rubs, murmurs or gallops heard. ABDOMEN: Benign. MUSCULOSKELETAL: No joint deformity, no clubbing, no edema.  NEUROLOGIC: No focal deficit, no gait disturbance, speech is fluent. SKIN: Intact,warm,dry. PSYCH: Mood  and behavior normal.    Assessment & Plan:     ICD-10-CM   1. Stage 3 severe COPD by GOLD classification (HCC)  J44.9    Continue Breztri 2 puffs twice a day Continue as needed albuterol    2. Nocturnal hypoxemia due to emphysema (HCC)  J43.9    G47.36    Cannula oxygen at 2 L/min nocturnally Patient compliant and notes benefit of therapy    3. SOB (shortness of breath)  R06.02    Reconsider pulmonary rehab    4. Decreased vision  H54.7 Ambulatory referral to Ophthalmology   Patient referred to ophthalmology    5. Former smoker  Z87.891    Patient remains abstinent of cigarettes     Orders Placed This Encounter  Procedures   Ambulatory referral to Ophthalmology    Referral Priority:   Routine    Referral Type:   Consultation    Referral Reason:   Specialty Services Required    Requested Specialty:   Ophthalmology    Number of Visits Requested:   1   Will see  the patient in follow-up in 6 months time she is to contact us prior to that time should any new difficulties arise.  Gailen Shelter, MD Advanced Bronchoscopy PCCM [facility]    *This note was dictated using voice recognition software/Dragon.  Despite best efforts to proofread, errors can occur which can change the meaning. Any transcriptional errors that result from this process are unintentional and may not be fully corrected at the time of dictation.

## 2022-04-22 NOTE — Telephone Encounter (Signed)
? ?  Telephone encounter was:  Successful.  ?04/22/2022 ?Name: Rhyli Depaula MRN: [id number] DOB: 1956-07-31 ? ?Laurabelle Gorczyca is a 66 y.o. year old female who is a primary care patient of [name], DO . The community resource team was consulted for assistance with oxygen ? ?Care guide performed the following interventions: Patient provided with information about care guide support team and interviewed to confirm resource needs ?Patient will call back in the care devoted copay for oxygen is 20%of the cost patient has outstanding bill for 170.00 . ? ?Follow Up Plan:  Client will call back ? ?Aleese Kamps Greenauer -Berneda Rose ?Care Guide , Embedded Care Coordination ?[place], Care Management  ?[phone] ?[address] [place] , [place] [address] ?Email : [email or web address] ?  ?

## 2022-04-22 NOTE — Patient Instructions (Signed)
Continue taking Breztri 2 puffs twice a day. ? ?Continue your oxygen at nighttime. ? ?We have referred you to an eye doctor locally. ? ?We will see you in follow-up in 6 months time call sooner should any new problems arise. ? ? ?

## 2022-04-28 ENCOUNTER — Telehealth: Payer: Self-pay | Admitting: *Deleted

## 2022-04-28 NOTE — Telephone Encounter (Signed)
   Telephone encounter was:  Successful.  04/28/2022 Name: Michaela Broski MRN: [id number] DOB: May 17, 1956  Ivanna Kocak is a 66 y.o. year old female who is a primary care patient of [name], DO . The community resource team was consulted for assistance with Food Insecurity and oxygen  Care guide performed the following interventions: needs oxygen for nights and copay is 20 % so going to send back the o2 from [employer] as she has been given a concentrator will also provide her food banks list denies need for meals on wheels . Marland Kitchen  Follow Up Plan:  No further follow up planned at this time. The patient has been provided with needed resources. Alois Cliche -[facility] Guide , Embedded Care Coordination [facility], Care Management  [phone] [address] [place] , [place] [address] Email : [email or web address]

## 2022-05-02 DIAGNOSIS — J9601 Acute respiratory failure with hypoxia: Secondary | ICD-10-CM | POA: Diagnosis not present

## 2022-05-02 DIAGNOSIS — U071 COVID-19: Secondary | ICD-10-CM | POA: Diagnosis not present

## 2022-05-06 ENCOUNTER — Ambulatory Visit: Payer: No Typology Code available for payment source | Admitting: Pharmacist

## 2022-05-06 DIAGNOSIS — Z87891 Personal history of nicotine dependence: Secondary | ICD-10-CM

## 2022-05-06 DIAGNOSIS — J449 Chronic obstructive pulmonary disease, unspecified: Secondary | ICD-10-CM

## 2022-05-06 DIAGNOSIS — E785 Hyperlipidemia, unspecified: Secondary | ICD-10-CM | POA: Diagnosis not present

## 2022-05-06 DIAGNOSIS — J432 Centrilobular emphysema: Secondary | ICD-10-CM

## 2022-05-06 NOTE — Patient Instructions (Addendum)
Visit Information  Thank you for taking time to visit with me today. Please don't hesitate to contact me if I can be of assistance to you before our next scheduled telephone appointment.  Following are the goals we discussed today:   Goals Addressed             This Visit's Progress    Pharmacy Goals       Our goal bad cholesterol, or LDL, is less than 70. This is why it is important to continue taking your rosuvastatin and ezetimibe.  Feel free to call me with any questions or concerns. I look forward to our next call!   Estelle Grumbles, PharmD, P[place]y [place], CPP Clinical Pharmacist [facility] [phone]         Our next appointment is by telephone on 6/14 at 9 am  Please call the care guide team at [phone] if you need to cancel or reschedule your appointment.    Patient verbalizes understanding of instructions and care plan provided today and agrees to view in MyChart. Active MyChart status and patient understanding of how to access instructions and care plan via MyChart confirmed with patient.

## 2022-05-06 NOTE — Chronic Care Management (AMB) (Deleted)
Care Management   Pharmacy Note  05/06/2022 Name: Brandi BarreCharlene Silverthorne MRN: [id number] DOB: 1956-05-05  Subjective: Brandi [name] is a 66 y.o. year old female who is a primary care patient of Smitty CordsKaramalegos, Alexander J, DO. The Care Management team was consulted for assistance with care management and care coordination needs.    Engaged with patient by telephone for follow up visit in response to provider referral for pharmacy case management and/or care coordination services.   The patient was given information about Care Management services today including:  Care Management services includes personalized support from designated clinical staff supervised by the patient's primary care provider, including individualized plan of care and coordination with other care providers. 24/7 contact phone numbers for assistance for urgent and routine care needs. The patient may stop case management services at any time by phone call to the office staff.  Patient agreed to services and consent obtained.  Assessment:  Review of patient status, including review of consultants reports, laboratory and other test data, was performed as part of comprehensive evaluation and provision of chronic care management services.   SDOH (Social Determinants of Health) assessments and interventions performed:    Objective:  Lab Results  Component Value Date   CREATININE 0.87 11/17/2021   CREATININE 0.58 01/20/2021   CREATININE 0.51 12/23/2020     Care Plan  No Known Allergies  Medications Reviewed Today     Reviewed by Manuela Neptuneelles, Verla Bryngelson A, RPH-CPP (Pharmacist) on 05/06/22 at 1129  Med List Status: <None>   Medication Order Taking? Sig Documenting Provider Last Dose Status Informant  albuterol (PROVENTIL) (2.5 MG/3ML) 0.083% nebulizer solution [id number] Yes Take 3 mLs (2.5 mg total) by nebulization every 6 (six) hours as needed for wheezing or shortness of breath. Salena SanerGonzalez, Carmen L, MD Taking Active   albuterol  (VENTOLIN HFA) 108 (90 Base) MCG/ACT inhaler [id number] Yes Inhale 2 puffs into the lungs every 4 (four) hours as needed for wheezing or shortness of breath. Salena SanerGonzalez, Carmen L, MD Taking Active   alendronate (FOSAMAX) 70 MG tablet [id number] Yes TAKE ONE TABLET BY MOUTH EACH WEEK, ON AN EMPTY STOMACH BEFORE BREAKFAST WITH 8oz OF WATER AND REMAIN UPRIGHT FOR :30 [name], [name], MD Taking Active   alum & mag hydroxide-simeth (MAALOX/MYLANTA) 200-200-20 MG/5ML suspension [id number]  Take 30 mLs by mouth every 4 (four) hours as needed for indigestion or heartburn. Esaw GrandchildGriffith, Kelly A, DO  Active   aspirin EC 81 MG tablet [id number]  Take 1 tablet (81 mg total) by mouth daily. Antonieta IbaGollan, [name], MD  Active Self  BREZTRI AEROSPHERE 160-9-4.8 MCG/ACT Sandrea MatteAERO [id number] Yes USE 2 INHALATION MY MOIUTH TWICE A DAY RINSE MOUTH AFTER EACH USE Smitty CordsKaramalegos, Alexander J, DO Taking Active   busPIRone (BUSPAR) 5 MG tablet [id number]  TAKE 1 TABLET BY MOUTH TWICE DAILY AS NEEDED FOR ANXIETY [name], Netta NeatAlexander J, DO  Active   Calcium Carbonate-Vit D-Min (CALCIUM 1200) 1200-1000 MG-UNIT CHEW [id number]  Chew 1,200 mg by mouth daily. Natale MilchSchuman, [name], MD  Active   Cholecalciferol (VITAMIN D3) 125 MCG (5000 UT) TABS [id number]  Take by mouth. Vitamin D3 5,000 IU daily for 12 weeks then reduce to OTC Vitamin D3 2,000 IU daily for maintenance [provider]  Active   ezetimibe (ZETIA) 10 MG tablet [id number] Yes Take 1 tablet (10 mg total) by mouth daily. Antonieta IbaGollan, [name], MD Taking Active   famotidine (PEPCID) 20 MG tablet [id number] Yes Take 1 tablet (20 mg total) by mouth 2 (two) times daily.  Patient taking differently: Take 20 mg by mouth 2 (two) times daily as needed.   [name], DO Taking Active   feeding supplement (ENSURE ENLIVE / ENSURE PLUS) LIQD [id number]  Take 237 mLs by mouth 2 (two) times daily between meals. [name], DO  Active   Multiple Vitamin (MULTIVITAMIN WITH  MINERALS) TABS tablet [id number]  Take 1 tablet by mouth daily. [name], DO  Active   omeprazole (PRILOSEC) 20 MG capsule [id number] Yes TAKE 1 CAPSULE BY MOUTH ONCE DAILY [name], [name], DO Taking Active   ondansetron (ZOFRAN-ODT) 4 MG disintegrating tablet [id number]  Take 1 tablet (4 mg total) by mouth every 8 (eight) hours as needed for nausea or vomiting. [name], DO  Active   OXYGEN [id number]  Inhale into the lungs at bedtime as needed. [provider]  Active   rosuvastatin (CRESTOR) 10 MG tablet [id number] Yes Take 1 tablet (10 mg total) by mouth daily. [name], MD Taking Active   sodium chloride (OCEAN) 0.65 % SOLN nasal spray [id number]  Place 1 spray into both nostrils as needed for congestion. [provider]  Active   Specialty Vitamins Products (HAIR NOURISHING SUPPLEMENT PO) [id number]  Take 1 tablet by mouth daily. [provider]  Active             Patient Active Problem List   Diagnosis Date Noted   Pre-diabetes 04/08/2021   Vitamin D deficiency 04/08/2021   COVID-19 vaccination declined 12/04/2020   Centrilobular emphysema (HCC) 04/08/2020   Nodule of upper lobe of left lung 04/08/2020   Aortic atherosclerosis (HCC) 05/21/2019   Coronary artery calcification seen on CT scan 05/21/2019   Former smoker 05/21/2019   Hyperlipidemia 05/21/2019     Care Plan : PharmD - Medication Assistance/Adherence  Updates made by [name], RPH-CPP since 05/06/2022 12:00 AM     Problem: Disease Progression      Long-Range Goal: Disease Progression Prevented or Minimized   Start Date: 04/30/2021  Expected End Date: 07/29/2021  Recent Progress: On track  Priority: High  Note:   Current Barriers:  Unable to independently afford treatment regimen Reports cost of Breztri inhaler unaffordable through [facility] health plan  Pharmacist Clinical Goal(s):  Over the next 90 days, patient will  verbalize ability to afford treatment regimen through collaboration with PharmD and provider.   Interventions: 1:1 collaboration with [name], DO regarding development and update of comprehensive plan of care as evidenced by provider attestation and co-signature Inter-disciplinary care team collaboration (see longitudinal plan of care) Perform chart review Patient seen for Office Visit with [facility] [place] on 5/17. Provider advised patient: Continue taking Breztri 2 puffs twice a day. Continue your oxygen at nighttime. Referral placed to an eye doctor locally Today patient reports she has called the eye doctor to schedule an appointment and is waiting on a call back  Hyperlipidemia: Current treatment: Ezetimibe 10 mg daily Rosuvastatin 10 mg daily Previous therapies tried: rosuvastatin 20 mg daily (numbness/tingling) Note patient uses weekly pillbox to organize her medications/adherence aid  COPD: Current treatment: Breztri inhaler - 2 puffs in morning and at bedtime Albuterol inhaler - 1 puffs every 4 hours as needed Overnight oxygen Using nasal saline as directed Reports using Breztri inhaler twice daily (and rinses out mouth after use) and use albuterol inhaler as needed as directed Note patient reports unable to afford Pulmonary Rehab at this time   Medication Assistance: Based on patient  report, meets income requirement for Extra Help through [employer], but is concerned that would not qualify based on other resources (money received from [employer]) Based on reported income, patient would qualify for patient assistance for [employer] inhaler from [employer], but program would require patient to have completed Extra Help application Again counsel patient on options for completing Extra Help application online or paper application from [employer] office.  Again offer to assist patient with Extra Help application over the phone. Schedule  appointment to complete this application with patient as requested  Medication Adherence: Patient uses weekly pillbox as adherence aid   Patient Goals/Self-Care Activities Over the next 90 days, patient will:  - take medications as prescribed - collaborate with provider on medication access solutions - attend medical appointments as scheduled  Follow Up Plan: Telephone follow up appointment with care management team member scheduled for: 6/14 at 9 am for medication assistance follow up      Estelle Grumbles, PharmD, Patsy [place], CPP Clinical Pharmacist [facility] [phone]

## 2022-05-06 NOTE — Chronic Care Management (AMB) (Signed)
Chronic Care Management CCM Pharmacy Note  05/06/2022 Name:  Brandi BarreCharlene Behrendt MRN:  [id number] DOB:  [birth year]   Subjective: Brandi [name] is an 66 y.o. year old female who is a primary patient of Smitty CordsKaramalegos, Alexander J, DO.  The CCM team was consulted for assistance with disease management and care coordination needs.    Engaged with patient by telephone for follow up visit for pharmacy case management and/or care coordination services.   Objective:  Medications Reviewed Today     Reviewed by Manuela Neptuneelles, Addi Pak A, RPH-CPP (Pharmacist) on 05/06/22 at 1129  Med List Status: <None>   Medication Order Taking? Sig Documenting Provider Last Dose Status Informant  albuterol (PROVENTIL) (2.5 MG/3ML) 0.083% nebulizer solution [id number] Yes Take 3 mLs (2.5 mg total) by nebulization every 6 (six) hours as needed for wheezing or shortness of breath. Salena SanerGonzalez, Carmen L, MD Taking Active   albuterol (VENTOLIN HFA) 108 (90 Base) MCG/ACT inhaler [id number] Yes Inhale 2 puffs into the lungs every 4 (four) hours as needed for wheezing or shortness of breath. Salena SanerGonzalez, Carmen L, MD Taking Active   alendronate (FOSAMAX) 70 MG tablet [id number] Yes TAKE ONE TABLET BY MOUTH EACH WEEK, ON AN EMPTY STOMACH BEFORE BREAKFAST WITH 8oz OF WATER AND REMAIN UPRIGHT FOR :30 [name], [name], MD Taking Active   alum & mag hydroxide-simeth (MAALOX/MYLANTA) 200-200-20 MG/5ML suspension [id number]  Take 30 mLs by mouth every 4 (four) hours as needed for indigestion or heartburn. Esaw GrandchildGriffith, Kelly A, DO  Active   aspirin EC 81 MG tablet [id number]  Take 1 tablet (81 mg total) by mouth daily. Antonieta IbaGollan, [name], MD  Active Self  BREZTRI AEROSPHERE 160-9-4.8 MCG/ACT Sandrea MatteAERO [id number] Yes USE 2 INHALATION MY MOIUTH TWICE A DAY RINSE MOUTH AFTER EACH USE Smitty CordsKaramalegos, Alexander J, DO Taking Active   busPIRone (BUSPAR) 5 MG tablet [id number]  TAKE 1 TABLET BY MOUTH TWICE DAILY AS NEEDED FOR ANXIETY [name], Netta NeatAlexander J, DO  Active    Calcium Carbonate-Vit D-Min (CALCIUM 1200) 1200-1000 MG-UNIT CHEW [id number]  Chew 1,200 mg by mouth daily. Natale MilchSchuman, [name], MD  Active   Cholecalciferol (VITAMIN D3) 125 MCG (5000 UT) TABS [id number]  Take by mouth. Vitamin D3 5,000 IU daily for 12 weeks then reduce to OTC Vitamin D3 2,000 IU daily for maintenance [provider]  Active   ezetimibe (ZETIA) 10 MG tablet [id number] Yes Take 1 tablet (10 mg total) by mouth daily. Antonieta IbaGollan, [name], MD Taking Active   famotidine (PEPCID) 20 MG tablet [id number] Yes Take 1 tablet (20 mg total) by mouth 2 (two) times daily.  Patient taking differently: Take 20 mg by mouth 2 (two) times daily as needed.   Smitty CordsKaramalegos, Alexander J, DO Taking Active   feeding supplement (ENSURE ENLIVE / ENSURE PLUS) LIQD [id number]  Take 237 mLs by mouth 2 (two) times daily between meals. Pennie BanterGriffith, Kelly A, DO  Active   Multiple Vitamin (MULTIVITAMIN WITH MINERALS) TABS tablet [id number]  Take 1 tablet by mouth daily. Pennie BanterGriffith, Kelly A, DO  Active   omeprazole (PRILOSEC) 20 MG capsule [id number] Yes TAKE 1 CAPSULE BY MOUTH ONCE DAILY [name], Netta NeatAlexander J, DO Taking Active   ondansetron (ZOFRAN-ODT) 4 MG disintegrating tablet [id number]  Take 1 tablet (4 mg total) by mouth every 8 (eight) hours as needed for nausea or vomiting. Smitty CordsKaramalegos, Alexander J, DO  Active   OXYGEN [id number]  Inhale into the lungs at bedtime as needed. [provider]  Active   rosuvastatin (CRESTOR) 10 MG tablet  [id number] Yes Take 1 tablet (10 mg total) by mouth daily. [name], MD Taking Active   sodium chloride (OCEAN) 0.65 % SOLN nasal spray [id number]  Place 1 spray into both nostrils as needed for congestion. [provider]  Active   Specialty Vitamins Products (HAIR NOURISHING SUPPLEMENT PO) [id number]  Take 1 tablet by mouth daily. [provider]  Active             Pertinent Labs:   Lab Results  Component Value Date   CREATININE 0.87  11/17/2021   BUN 12 11/17/2021   NA 140 11/17/2021   K 4.2 11/17/2021   CL 102 11/17/2021   CO2 24 11/17/2021    SDOH:  (Social Determinants of Health) assessments and interventions performed:    CCM Care Plan  Review of patient past medical history, allergies, medications, health status, including review of consultants reports, laboratory and other test data, was performed as part of comprehensive evaluation and provision of chronic care management services.   Care Plan : PharmD - Medication Assistance/Adherence  Updates made by [name], RPH-CPP since 05/06/2022 12:00 AM     Problem: Disease Progression      Long-Range Goal: Disease Progression Prevented or Minimized   Start Date: 04/30/2021  Expected End Date: 07/29/2021  Recent Progress: On track  Priority: High  Note:   Current Barriers:  Unable to independently afford treatment regimen Reports cost of Breztri inhaler unaffordable through [facility] health plan  Pharmacist Clinical Goal(s):  Over the next 90 days, patient will verbalize ability to afford treatment regimen through collaboration with PharmD and provider.   Interventions: 1:1 collaboration with [name], DO regarding development and update of comprehensive plan of care as evidenced by provider attestation and co-signature Inter-disciplinary care team collaboration (see longitudinal plan of care) Perform chart review Patient seen for Office Visit with [facility] [place] on 5/17. Provider advised patient: Continue taking Breztri 2 puffs twice a day. Continue your oxygen at nighttime. Referral placed to an eye doctor locally Today patient reports she has called the eye doctor to schedule an appointment and is waiting on a call back  Hyperlipidemia: Current treatment: Ezetimibe 10 mg daily Rosuvastatin 10 mg daily Previous therapies tried: rosuvastatin 20 mg daily (numbness/tingling) Note patient uses weekly  pillbox to organize her medications/adherence aid  COPD: Current treatment: Breztri inhaler - 2 puffs in morning and at bedtime Albuterol inhaler - 1 puffs every 4 hours as needed Overnight oxygen Using nasal saline as directed Reports using Breztri inhaler twice daily (and rinses out mouth after use) and use albuterol inhaler as needed as directed Note patient reports unable to afford Pulmonary Rehab at this time   Medication Assistance: Based on patient report, meets income requirement for Extra Help through [employer], but is concerned that would not qualify based on other resources (money received from [employer]) Based on reported income, patient would qualify for patient assistance for [employer] inhaler from [employer], but program would require patient to have completed Extra Help application Again counsel patient on options for completing Extra Help application online or paper application from [employer] office.  Again offer to assist patient with Extra Help application over the phone. Schedule appointment to complete this application with patient as requested  Medication Adherence: Patient uses weekly pillbox as adherence aid   Patient Goals/Self-Care Activities Over the next 90 days, patient will:  - take medications as prescribed - collaborate with provider on medication access  solutions - attend medical appointments as scheduled  Follow Up Plan: Telephone follow up appointment with care management team member scheduled for: 6/14 at 9 am for medication assistance follow up       Estelle Grumbles, PharmD, Patsy [place], CPP Clinical Pharmacist [facility] [phone]

## 2022-05-20 ENCOUNTER — Ambulatory Visit (INDEPENDENT_AMBULATORY_CARE_PROVIDER_SITE_OTHER): Payer: No Typology Code available for payment source | Admitting: Pharmacist

## 2022-05-20 DIAGNOSIS — J432 Centrilobular emphysema: Secondary | ICD-10-CM

## 2022-05-20 NOTE — Chronic Care Management (AMB) (Signed)
Chronic Care Management CCM Pharmacy Note  05/20/2022 Name:  Brandi [name] MRN:  [id number] DOB:  03-22-1956   Subjective: Brandi [name] is an 66 y.o. year old female who is a primary patient of [name], DO.  The CCM team was consulted for assistance with disease management and care coordination needs.    Engaged with patient by telephone for follow up visit for pharmacy case management and/or care coordination services.   Objective:  Medications Reviewed Today     Reviewed by [name], RPH-CPP (Pharmacist) on 05/06/22 at 1129  Med List Status: <None>   Medication Order Taking? Sig Documenting Provider Last Dose Status Informant  albuterol (PROVENTIL) (2.5 MG/3ML) 0.083% nebulizer solution [id number] Yes Take 3 mLs (2.5 mg total) by nebulization every 6 (six) hours as needed for wheezing or shortness of breath. [name], MD Taking Active   albuterol (VENTOLIN HFA) 108 (90 Base) MCG/ACT inhaler [id number] Yes Inhale 2 puffs into the lungs every 4 (four) hours as needed for wheezing or shortness of breath. [name], MD Taking Active   alendronate (FOSAMAX) 70 MG tablet [id number] Yes TAKE ONE TABLET BY MOUTH EACH WEEK, ON AN EMPTY STOMACH BEFORE BREAKFAST WITH 8oz OF WATER AND REMAIN UPRIGHT FOR :30 [name], [name], MD Taking Active   alum & mag hydroxide-simeth (MAALOX/MYLANTA) 200-200-20 MG/5ML suspension [id number]  Take 30 mLs by mouth every 4 (four) hours as needed for indigestion or heartburn. [name], DO  Active   aspirin EC 81 MG tablet [id number]  Take 1 tablet (81 mg total) by mouth daily. [name], MD  Active Self  BREZTRI AEROSPHERE 160-9-4.8 MCG/ACT [name] [id number] Yes USE 2 INHALATION MY MOIUTH TWICE A DAY RINSE MOUTH AFTER EACH USE [name], DO Taking Active   busPIRone (BUSPAR) 5 MG tablet [id number]  TAKE 1 TABLET BY MOUTH TWICE DAILY AS NEEDED FOR ANXIETY [name], [name], DO  Active    Calcium Carbonate-Vit D-Min (CALCIUM 1200) 1200-1000 MG-UNIT CHEW [id number]  Chew 1,200 mg by mouth daily. [name], MD  Active   Cholecalciferol (VITAMIN D3) 125 MCG (5000 UT) TABS [id number]  Take by mouth. Vitamin D3 5,000 IU daily for 12 weeks then reduce to OTC Vitamin D3 2,000 IU daily for maintenance [provider]  Active   ezetimibe (ZETIA) 10 MG tablet [id number] Yes Take 1 tablet (10 mg total) by mouth daily. [name], MD Taking Active   famotidine (PEPCID) 20 MG tablet [id number] Yes Take 1 tablet (20 mg total) by mouth 2 (two) times daily.  Patient taking differently: Take 20 mg by mouth 2 (two) times daily as needed.   [name], DO Taking Active   feeding supplement (ENSURE ENLIVE / ENSURE PLUS) LIQD [id number]  Take 237 mLs by mouth 2 (two) times daily between meals. [name], DO  Active   Multiple Vitamin (MULTIVITAMIN WITH MINERALS) TABS tablet [id number]  Take 1 tablet by mouth daily. [name], DO  Active   omeprazole (PRILOSEC) 20 MG capsule [id number] Yes TAKE 1 CAPSULE BY MOUTH ONCE DAILY [name], [name], DO Taking Active   ondansetron (ZOFRAN-ODT) 4 MG disintegrating tablet [id number]  Take 1 tablet (4 mg total) by mouth every 8 (eight) hours as needed for nausea or vomiting. [name], DO  Active   OXYGEN [id number]  Inhale into the lungs at bedtime as needed. [provider]  Active   rosuvastatin (CRESTOR) 10 MG tablet  [id number] Yes Take 1 tablet (10 mg total) by mouth daily. [name], MD Taking Active   sodium chloride (OCEAN) 0.65 % SOLN nasal spray [id number]  Place 1 spray into both nostrils as needed for congestion. [provider]  Active   Specialty Vitamins Products (HAIR NOURISHING SUPPLEMENT PO) [id number]  Take 1 tablet by mouth daily. [provider]  Active              SDOH:  (Social Determinants of Health) assessments and interventions  performed:    CCM Care Plan  Review of patient past medical history, allergies, medications, health status, including review of consultants reports, laboratory and other test data, was performed as part of comprehensive evaluation and provision of chronic care management services.   Care Plan : PharmD - Medication Assistance/Adherence  Updates made by [name], RPH-CPP since 05/20/2022 12:00 AM     Problem: Disease Progression      Long-Range Goal: Disease Progression Prevented or Minimized   Start Date: 04/30/2021  Expected End Date: 07/29/2021  Recent Progress: On track  Priority: High  Note:   Current Barriers:  Unable to independently afford treatment regimen Reports cost of Breztri inhaler unaffordable through [facility] health plan  Pharmacist Clinical Goal(s):  Over the next 90 days, patient will verbalize ability to afford treatment regimen through collaboration with PharmD and provider.   Interventions: 1:1 collaboration with [name], DO regarding development and update of comprehensive plan of care as evidenced by provider attestation and co-signature Inter-disciplinary care team collaboration (see longitudinal plan of care)  COPD: Current treatment: Breztri inhaler - 2 puffs in morning and at bedtime Albuterol inhaler - 1 puffs every 4 hours as needed Overnight oxygen Have counseled on using Breztri inhaler twice daily (and rinses out mouth after use) and use albuterol inhaler as needed as directed Note patient reports unable to afford Pulmonary Rehab at this time   Medication Assistance: Based on patient report, meets income requirement for Extra Help through [employer], but is concerned that would not qualify based on other resources (money received from [employer]) Based on reported income, patient would qualify for patient assistance for [employer] inhaler from [employer], but program would require patient to have  completed Extra Help application Today reach out to patient to assist patient with Extra Help application over the phone as requested/scheduled Start to complete this application with patient today, but as completing form, patient finds that she has questions to ask Social Security before proceeding. Provide patient with phone number for Social Security from this application.  Insufficient information entered to reach page where application is saved, so would need to restart application if patient wishes to proceed Patient states that she will return call to [facility] Pharmacist if/when ready to proceed  Medication Adherence: Patient uses weekly pillbox as adherence aid   Patient Goals/Self-Care Activities Over the next 90 days, patient will:  - take medications as prescribed - collaborate with provider on medication access solutions - attend medical appointments as scheduled  Follow Up Plan:  1) Patient denies need to schedule follow up appointment with CM Pharmacist today, rather prefers to call back when needed for medication questions/concerns 2) Confirm patient has contact information for CM Pharmacist       Estelle Grumbles, PharmD, Patsy [place], CPP Clinical Pharmacist [facility] [phone]

## 2022-05-20 NOTE — Patient Instructions (Signed)
Visit Information  Thank you for taking time to visit with me today. Please don't hesitate to contact me if I can be of assistance to you before our next scheduled telephone appointment.  Following are the goals we discussed today:   Goals Addressed             This Visit's Progress    Pharmacy Goals       Our goal bad cholesterol, or LDL, is less than 70. This is why it is important to continue taking your rosuvastatin and ezetimibe.  Feel free to call me with any questions or concerns. I look forward to our next call!  Wallace Cullens, PharmD, Para March, CPP Clinical Pharmacist [facility] [phone]         Per patient request, no further follow up appointment scheduled at this time  Please call the care guide team at [phone] if you need to cancel or reschedule your appointment.    Patient verbalizes understanding of instructions and care plan provided today and agrees to view in [place]. Active MyChart status and patient understanding of how to access instructions and care plan via MyChart confirmed with patient.

## 2022-05-22 ENCOUNTER — Other Ambulatory Visit: Payer: Self-pay | Admitting: Family Medicine

## 2022-05-22 DIAGNOSIS — F4323 Adjustment disorder with mixed anxiety and depressed mood: Secondary | ICD-10-CM

## 2022-05-22 NOTE — Telephone Encounter (Signed)
Requested Prescriptions  Pending Prescriptions Disp Refills  . busPIRone (BUSPAR) 5 MG tablet [Pharmacy Med Name: BUSPIRONE HCL 5 MG TAB] 60 tablet 2    Sig: TAKE 1 TABLET BY MOUTH TWICE DAILY AS NEEDED FOR ANXIETY     Psychiatry: Anxiolytics/Hypnotics - Non-controlled Passed - 05/22/2022  3:06 PM      Passed - Valid encounter within last 12 months    Recent Outpatient Visits          2 months ago Viral gastroenteritis   [facility] [place], [name], DO   3 months ago COPD with acute exacerbation [facility])   [facility], [name], DO   5 months ago Dyspnea on exertion   [facility] [place], Salvadore Oxford, NP   9 months ago Centrilobular emphysema [facility])   [facility] [name], DO   1 year ago Centrilobular emphysema [facility])   [facility] [name], DO      Future Appointments            In 1 month [facility], [facility]

## 2022-05-26 ENCOUNTER — Telehealth: Payer: Self-pay | Admitting: Acute Care

## 2022-05-26 NOTE — Telephone Encounter (Signed)
Attempted to reach pt to schedule annual LDCT-LVMM 

## 2022-05-28 ENCOUNTER — Other Ambulatory Visit: Payer: Self-pay | Admitting: Family Medicine

## 2022-05-28 ENCOUNTER — Other Ambulatory Visit: Payer: Self-pay

## 2022-05-28 DIAGNOSIS — K219 Gastro-esophageal reflux disease without esophagitis: Secondary | ICD-10-CM

## 2022-05-28 MED ORDER — OMEPRAZOLE 20 MG PO CPDR: 20.0000 mg | DELAYED_RELEASE_CAPSULE | Freq: Every day | ORAL | 3 refills | Status: DC

## 2022-05-28 NOTE — Telephone Encounter (Unsigned)
Copied from CRM [id number]. Topic: General - Other >> May 28, 2022  2:12 PM Everette C wrote: Reason for CRM: Medication Refill - Medication: omeprazole (PRILOSEC) 20 MG capsule [[id number]]   Has the patient contacted their pharmacy? No. The patient has taken two capsules daily rather than one  (Agent: If no, request that the patient contact the pharmacy for the refill. If patient does not wish to contact the pharmacy document the reason why and proceed with request.) (Agent: If yes, when and what did the pharmacy advise?)  Preferred Pharmacy (with phone number or street name): [facility] [place], [place] - [address] [place] [address] Phone: [phone] Fax: [phone] Hours: Not open 24 hours  Has the patient been seen for an appointment in the last year OR does the patient have an upcoming appointment? Yes.    Agent: Please be advised that RX refills may take up to 3 business days. We ask that you follow-up with your pharmacy.

## 2022-05-28 NOTE — Telephone Encounter (Signed)
Requested medications are due for refill today.  no  Requested medications are on the active medications list.  yes  Last refill. 05/28/2022 #90 3 refills  Future visit scheduled.   yes  Notes to clinic.  Per note from PT. Pt is taking 2 capsules daily. Please advise.    Requested Prescriptions  Pending Prescriptions Disp Refills   omeprazole (PRILOSEC) 20 MG capsule 90 capsule 3    Sig: Take 1 capsule (20 mg total) by mouth daily.     Gastroenterology: Proton Pump Inhibitors Passed - 05/28/2022  2:54 PM      Passed - Valid encounter within last 12 months    Recent Outpatient Visits           2 months ago Viral gastroenteritis   [facility] [place], [name], DO   3 months ago COPD with acute exacerbation [facility])   [facility] [place], [name], DO   5 months ago Dyspnea on exertion   [facility] [place], Salvadore Oxford, NP   9 months ago Centrilobular emphysema [facility])   [facility] [name], DO   1 year ago Centrilobular emphysema [facility])   [facility], [name], DO       Future Appointments             In 1 month [facility], [facility]

## 2022-06-04 ENCOUNTER — Telehealth: Payer: Self-pay

## 2022-06-04 ENCOUNTER — Other Ambulatory Visit: Payer: Self-pay

## 2022-06-04 DIAGNOSIS — K219 Gastro-esophageal reflux disease without esophagitis: Secondary | ICD-10-CM

## 2022-06-04 MED ORDER — OMEPRAZOLE 20 MG PO CPDR: 20.0000 mg | DELAYED_RELEASE_CAPSULE | Freq: Every day | ORAL | 3 refills | Status: DC

## 2022-06-04 NOTE — Telephone Encounter (Signed)
Copied from CRM [id number]. Topic: General - Other >> Jun 03, 2022  4:14 PM Everette C wrote: Reason for CRM: The patient has called to follow up on their request for a refill of omeprazole (PRILOSEC) 20 MG capsule [[id number]]   Please contact the patient further when possible   I refilled the medication on 6/22 to [facility] in [place].   Just in case, I have just sent it again in case there was an issue.  I attempted to call her, but got no answer.

## 2022-06-05 DIAGNOSIS — J432 Centrilobular emphysema: Secondary | ICD-10-CM

## 2022-06-17 DIAGNOSIS — Z0100 Encounter for examination of eyes and vision without abnormal findings: Secondary | ICD-10-CM | POA: Diagnosis not present

## 2022-06-17 DIAGNOSIS — H5203 Hypermetropia, bilateral: Secondary | ICD-10-CM | POA: Diagnosis not present

## 2022-06-22 ENCOUNTER — Other Ambulatory Visit: Payer: Self-pay | Admitting: Family Medicine

## 2022-06-22 DIAGNOSIS — U071 COVID-19: Secondary | ICD-10-CM | POA: Diagnosis not present

## 2022-06-22 DIAGNOSIS — J9601 Acute respiratory failure with hypoxia: Secondary | ICD-10-CM | POA: Diagnosis not present

## 2022-06-22 DIAGNOSIS — K219 Gastro-esophageal reflux disease without esophagitis: Secondary | ICD-10-CM

## 2022-06-22 NOTE — Telephone Encounter (Unsigned)
Copied from CRM [id number]. Topic: General - Other >> Jun 22, 2022  4:40 PM Everette C wrote: Reason for CRM: The patient has called to request a prescription refill of their omeprazole (PRILOSEC) 20 MG capsule [[id number]  The patient would like to have the medication prescribed with directions to take the medication twice daily with the quantity doubled  The patient's preferred pharmacy is: [facility] - [place], [place] - [address] [place] [address] Phone: [phone] Fax: [phone] Hours: Not open 24 hours  Please contact the patient when prescription is submitted

## 2022-06-23 MED ORDER — OMEPRAZOLE 20 MG PO CPDR: 20.0000 mg | DELAYED_RELEASE_CAPSULE | Freq: Two times a day (BID) | ORAL | 3 refills | Status: DC

## 2022-06-23 NOTE — Telephone Encounter (Signed)
The patient would like to have the medication dosage changed. Pt would like to take medication twice a day. Pt would like quantity doubled, routing for approval.

## 2022-06-30 ENCOUNTER — Ambulatory Visit: Payer: No Typology Code available for payment source | Admitting: Licensed Clinical Social Worker

## 2022-07-13 NOTE — Patient Instructions (Signed)
Visit Information  Thank you for taking time to visit with me today. Please don't hesitate to contact me if I can be of assistance to you before our next scheduled telephone appointment.  Following are the goals we discussed today:  Patient Goals/Self-Care Activities: Over the next 120 days Continue compliance with medication management Attend all scheduled appointments with providers Contact office with any questions or concerns Follow plan to deal with triggers   If you are experiencing a Mental Health or Behavioral Health Crisis or need someone to talk to, please call the Suicide and Crisis Lifeline: 988 call 911   Patient verbalizes understanding of instructions and care plan provided today and agrees to view in MyChart. Active MyChart status and patient understanding of how to access instructions and care plan via MyChart confirmed with patient.     No follow up required  Jenel Lucks, MSW, LCSW Lutricia Horsfall [facility] Management [facility]  [facility] [email or web address] Phone [phone] 8:04 AM

## 2022-07-13 NOTE — Chronic Care Management (AMB) (Signed)
Care Management Clinical Social Work Note  07/13/2022 Name: Brandi [name] MRN: [id number] DOB: July 24, 1956  Brandi [name] is a 66 y.o. year old female who is a primary care patient of [name], DO.  The Care Management team was consulted for assistance with chronic disease management and coordination needs.  Engaged with patient by telephone for follow up visit in response to provider referral for social work chronic care management and care coordination services  Consent to Services:  Ms. Wernli was given information about Care Management services today including:  Care Management services includes personalized support from designated clinical staff supervised by her physician, including individualized plan of care and coordination with other care providers 24/7 contact phone numbers for assistance for urgent and routine care needs. The patient may stop case management services at any time by phone call to the office staff.  Patient agreed to services and consent obtained.   Assessment: Review of patient past medical history, allergies, medications, and health status, including review of relevant consultants reports was performed today as part of a comprehensive evaluation and provision of chronic care management and care coordination services.  SDOH (Social Determinants of Health) assessments and interventions performed:    Advanced Directives Status: Not addressed in this encounter.  Care Plan  No Known Allergies  Outpatient Encounter Medications as of 06/30/2022  Medication Sig   albuterol (PROVENTIL) (2.5 MG/3ML) 0.083% nebulizer solution Take 3 mLs (2.5 mg total) by nebulization every 6 (six) hours as needed for wheezing or shortness of breath.   albuterol (VENTOLIN HFA) 108 (90 Base) MCG/ACT inhaler Inhale 2 puffs into the lungs every 4 (four) hours as needed for wheezing or shortness of breath.   alendronate (FOSAMAX) 70 MG tablet TAKE ONE TABLET BY MOUTH EACH WEEK,  ON AN EMPTY STOMACH BEFORE BREAKFAST WITH 8oz OF WATER AND REMAIN UPRIGHT FOR :30   alum & mag hydroxide-simeth (MAALOX/MYLANTA) 200-200-20 MG/5ML suspension Take 30 mLs by mouth every 4 (four) hours as needed for indigestion or heartburn.   aspirin EC 81 MG tablet Take 1 tablet (81 mg total) by mouth daily.   BREZTRI AEROSPHERE 160-9-4.8 MCG/ACT AERO USE 2 INHALATION MY MOIUTH TWICE A DAY RINSE MOUTH AFTER EACH USE   busPIRone (BUSPAR) 5 MG tablet TAKE 1 TABLET BY MOUTH TWICE DAILY AS NEEDED FOR ANXIETY   Calcium Carbonate-Vit D-Min (CALCIUM 1200) 1200-1000 MG-UNIT CHEW Chew 1,200 mg by mouth daily.   Cholecalciferol (VITAMIN D3) 125 MCG (5000 UT) TABS Take by mouth. Vitamin D3 5,000 IU daily for 12 weeks then reduce to OTC Vitamin D3 2,000 IU daily for maintenance   ezetimibe (ZETIA) 10 MG tablet Take 1 tablet (10 mg total) by mouth daily.   famotidine (PEPCID) 20 MG tablet Take 1 tablet (20 mg total) by mouth 2 (two) times daily. (Patient taking differently: Take 20 mg by mouth 2 (two) times daily as needed.)   feeding supplement (ENSURE ENLIVE / ENSURE PLUS) LIQD Take 237 mLs by mouth 2 (two) times daily between meals.   Multiple Vitamin (MULTIVITAMIN WITH MINERALS) TABS tablet Take 1 tablet by mouth daily.   omeprazole (PRILOSEC) 20 MG capsule Take 1 capsule (20 mg total) by mouth 2 (two) times daily before a meal.   ondansetron (ZOFRAN-ODT) 4 MG disintegrating tablet Take 1 tablet (4 mg total) by mouth every 8 (eight) hours as needed for nausea or vomiting.   OXYGEN Inhale into the lungs at bedtime as needed.   rosuvastatin (CRESTOR) 10 MG tablet Take 1  tablet (10 mg total) by mouth daily.   sodium chloride (OCEAN) 0.65 % SOLN nasal spray Place 1 spray into both nostrils as needed for congestion.   Specialty Vitamins Products (HAIR NOURISHING SUPPLEMENT PO) Take 1 tablet by mouth daily.   No facility-administered encounter medications on file as of 06/30/2022.    Patient Active Problem List    Diagnosis Date Noted   Pre-diabetes 04/08/2021   Vitamin D deficiency 04/08/2021   COVID-19 vaccination declined 12/04/2020   Centrilobular emphysema (HCC) 04/08/2020   Nodule of upper lobe of left lung 04/08/2020   Aortic atherosclerosis (HCC) 05/21/2019   Coronary artery calcification seen on CT scan 05/21/2019   Former smoker 05/21/2019   Hyperlipidemia 05/21/2019    Conditions to be addressed/monitored: Pre-Diabetes; Grief  Care Plan : General Social Work (Adult)  Updates made by Bridgett Larsson, LCSW since 07/13/2022 12:00 AM     Problem: Coping Skills (General Plan of Care)      Long-Range Goal: Coping Skills Enhanced Completed 06/30/2022  Start Date: 06/03/2021  Expected End Date: 08/06/2022  This Visit's Progress: On track  Recent Progress: On track  Priority: High  Note:   Current barriers:   Acute Mental Health needs related to grief Mental Health Concerns  Needs Support, Education, and Care Coordination in order to meet unmet mental health needs. Clinical Goal(s): Over the next 120 days, patient will work with SW, counselor and therapist to reduce or manage symptoms of agitation, mood instability, stress, and bipolar until connected for ongoing counseling. Clinical Interventions:  Assessed patient's previous and current treatment, coping skills, support system and barriers to care  Pt reports she is managing well. Staying active with church Pt successfully identified healthy coping skills Patient continues to receive strong support from family and friends. She has a Clinical research associate to assist with pending claim. Pt is [place] CCM LCSW reviewed upcoming appointments Mindfulness or Relaxation training provided Active listening / Reflection utilized  Emotional Support Provided Behavioral Activation reviewed Provided psychoeducation for mental health needs  Reviewed mental health medications and discussed importance of compliance:  Patient reports compliance with medication management Verbalization of feelings encouraged   Discussed plans with patient for ongoing care management follow up and provided patient with direct contact information for care management team Inter-disciplinary care team collaboration (see longitudinal plan of care) Patient Goals/Self-Care Activities: Over the next 120 days Continue compliance with medication management Attend all scheduled appointments with providers Contact office with any questions or concerns Follow plan to deal with triggers        Follow Up Plan: No follow up required  Jenel Lucks, MSW, LCSW Lutricia Horsfall [facility] Care Management [facility]  [facility] [email or web address] Phone [phone] 8:03 AM

## 2022-07-14 ENCOUNTER — Ambulatory Visit: Payer: Medicare HMO

## 2022-07-17 ENCOUNTER — Ambulatory Visit: Payer: No Typology Code available for payment source

## 2022-07-31 ENCOUNTER — Other Ambulatory Visit: Payer: Self-pay | Admitting: Family Medicine

## 2022-07-31 DIAGNOSIS — J432 Centrilobular emphysema: Secondary | ICD-10-CM

## 2022-07-31 NOTE — Telephone Encounter (Signed)
Requested medication (s) are due for refill today: yes  Requested medication (s) are on the active medication list: yes  Last refill:  Breztri 01/06/22, albuterol 12/11/21  Future visit scheduled: yes  Notes to clinic:  breztri doesn't have protocol attached and albuterol was by another provider. Please advise     Requested Prescriptions  Pending Prescriptions Disp Refills   BREZTRI AEROSPHERE 160-9-4.8 MCG/ACT AERO [Pharmacy Med Name: BREZTRI AEROSPHERE 160-9-4.8 MCG/AC] 10.7 g 3    Sig: USE 2 INHALATION MY MOIUTH TWICE A DAY RINSE MOUTH AFTER EACH USE     Off-Protocol Failed - 07/31/2022  5:41 PM      Failed - Medication not assigned to a protocol, review manually.      Passed - Valid encounter within last 12 months    Recent Outpatient Visits           4 months ago Viral gastroenteritis   [facility] [place], [name], DO   5 months ago COPD with acute exacerbation [facility])   [place], [name], DO   7 months ago Dyspnea on exertion   [facility] [place], Salvadore Oxford, NP   11 months ago Centrilobular emphysema [facility])   [facility] [name], DO   1 year ago Centrilobular emphysema [facility])   [facility] [name], DO       Future Appointments             In 4 days [facility], [facility]              albuterol (VENTOLIN HFA) 108 (90 Base) MCG/ACT inhaler [Pharmacy Med Name: ALBUTEROL SULFATE HFA 108 (90 BASE)] 8.5 g     Sig: INHALE 2 PUFFS INTO THE LUNGS EVERY 4 HOURS AS NEEDED FOR WHEEZING OR SHORTNESS OF BREATH     Pulmonology:  Beta Agonists 2 Passed - 07/31/2022  5:41 PM      Passed - Last BP in normal range    BP Readings from Last 1 Encounters:  04/22/22 120/80         Passed - Last Heart Rate in normal range    Pulse Readings from Last 1 Encounters:  04/22/22 95         Passed - Valid encounter within last 12 months     Recent Outpatient Visits           4 months ago Viral gastroenteritis   [facility] [place], [name], DO   5 months ago COPD with acute exacerbation [facility])   [facility] [place], [name], DO   7 months ago Dyspnea on exertion   [facility] [place], Salvadore Oxford, NP   11 months ago Centrilobular emphysema [facility])   [facility] [name], DO   1 year ago Centrilobular emphysema [facility])   [facility], [name], DO       Future Appointments             In 4 days [facility], [facility]

## 2022-08-04 ENCOUNTER — Telehealth: Payer: Self-pay

## 2022-08-04 ENCOUNTER — Ambulatory Visit (INDEPENDENT_AMBULATORY_CARE_PROVIDER_SITE_OTHER): Payer: No Typology Code available for payment source

## 2022-08-04 DIAGNOSIS — Z0000 Encounter for general adult medical examination without abnormal findings: Secondary | ICD-10-CM | POA: Diagnosis not present

## 2022-08-04 DIAGNOSIS — Z599 Problem related to housing and economic circumstances, unspecified: Secondary | ICD-10-CM | POA: Diagnosis not present

## 2022-08-04 DIAGNOSIS — Z5941 Food insecurity: Secondary | ICD-10-CM | POA: Diagnosis not present
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
## 2022-08-04 NOTE — Patient Instructions (Signed)

## 2022-08-04 NOTE — Progress Notes (Signed)
I connected with  Campbell Agramonte on 08/04/22 by a audio enabled telemedicine application and verified that I am speaking with the correct person using two identifiers.  Patient Location: Home  Provider Location: Office/Clinic  I discussed the limitations of evaluation and management by telemedicine. The patient expressed understanding and agreed to proceed.   Subjective:   Brandi [name] is a 66 y.o. female who presents for Medicare Annual (Subsequent) preventive examination.  Review of Systems    Per HPI unless specifically indicated above   Cardiac Risk Factors include: advanced age (>49men, >38 women);female gender, hyperlipidemia, aortic atherosclerosis and pre-diabetes.         Objective:       04/22/2022   10:03 AM 03/11/2022    3:57 PM 02/12/2022   10:41 AM  Vitals with BMI  Height 5\' 4"  5\' 4"  5\' 4"   Weight 124 lbs 6 oz 126 lbs 126 lbs 3 oz  BMI 21.34 21.62 21.65  Systolic 120  118  Diastolic 80  68  Pulse 95  84    There were no vitals filed for this visit. There is no height or weight on file to calculate BMI.     07/08/2021   10:39 AM 12/16/2020    7:44 AM 06/26/2015   11:37 AM 04/05/2015    4:05 PM  Advanced Directives  Does Patient Have a Medical Advance Directive? Yes No No No  Type of 02/13/2021 of [place];Living will     Copy of Healthcare Power of Attorney in Chart? No - copy requested     Would patient like information on creating a medical advance directive?  No - Patient declined Yes - Educational materials given     Current Medications (verified) Outpatient Encounter Medications as of 08/04/2022  Medication Sig   albuterol (PROVENTIL) (2.5 MG/3ML) 0.083% nebulizer solution Take 3 mLs (2.5 mg total) by nebulization every 6 (six) hours as needed for wheezing or shortness of breath.   albuterol (VENTOLIN HFA) 108 (90 Base) MCG/ACT inhaler INHALE 2 PUFFS INTO THE LUNGS EVERY 4 HOURS AS NEEDED FOR WHEEZING OR SHORTNESS OF BREATH    alendronate (FOSAMAX) 70 MG tablet TAKE ONE TABLET BY MOUTH EACH WEEK, ON AN EMPTY STOMACH BEFORE BREAKFAST WITH 8oz OF WATER AND REMAIN UPRIGHT FOR :30   alum & mag hydroxide-simeth (MAALOX/MYLANTA) 200-200-20 MG/5ML suspension Take 30 mLs by mouth every 4 (four) hours as needed for indigestion or heartburn.   aspirin EC 81 MG tablet Take 1 tablet (81 mg total) by mouth daily.   BREZTRI AEROSPHERE 160-9-4.8 MCG/ACT AERO USE 2 INHALATION MY MOIUTH TWICE A DAY RINSE MOUTH AFTER EACH USE   busPIRone (BUSPAR) 5 MG tablet TAKE 1 TABLET BY MOUTH TWICE DAILY AS NEEDED FOR ANXIETY   Calcium Carbonate-Vit D-Min (CALCIUM 1200) 1200-1000 MG-UNIT CHEW Chew 1,200 mg by mouth daily.   Cholecalciferol (VITAMIN D3) 125 MCG (5000 UT) TABS Take by mouth. Vitamin D3 5,000 IU daily for 12 weeks then reduce to OTC Vitamin D3 2,000 IU daily for maintenance   ezetimibe (ZETIA) 10 MG tablet Take 1 tablet (10 mg total) by mouth daily.   famotidine (PEPCID) 20 MG tablet Take 1 tablet (20 mg total) by mouth 2 (two) times daily. (Patient taking differently: Take 20 mg by mouth 2 (two) times daily as needed.)   feeding supplement (ENSURE ENLIVE / ENSURE PLUS) LIQD Take 237 mLs by mouth 2 (two) times daily between meals.   mometasone (ELOCON) 0.1 % lotion Apply  topically.   Multiple Vitamin (MULTIVITAMIN WITH MINERALS) TABS tablet Take 1 tablet by mouth daily.   omeprazole (PRILOSEC) 20 MG capsule Take 1 capsule (20 mg total) by mouth 2 (two) times daily before a meal.   ondansetron (ZOFRAN-ODT) 4 MG disintegrating tablet Take 1 tablet (4 mg total) by mouth every 8 (eight) hours as needed for nausea or vomiting.   OXYGEN Inhale 3 L into the lungs at bedtime as needed.   rosuvastatin (CRESTOR) 10 MG tablet Take 1 tablet (10 mg total) by mouth daily.   sodium chloride (OCEAN) 0.65 % SOLN nasal spray Place 1 spray into both nostrils as needed for congestion.   Specialty Vitamins Products (HAIR NOURISHING SUPPLEMENT PO) Take 1  tablet by mouth daily.   amoxicillin (AMOXIL) 500 MG capsule Take 500 mg by mouth 3 (three) times daily. (Patient not taking: Reported on 08/04/2022)   azithromycin (ZITHROMAX) 250 MG tablet Take 250 mg by mouth as directed. (Patient not taking: Reported on 08/04/2022)   No facility-administered encounter medications on file as of 08/04/2022.    Allergies (verified) Patient has no known allergies.   History: Past Medical History:  Diagnosis Date   Hyperlipidemia    Past Surgical History:  Procedure Laterality Date   APPENDECTOMY     CHOLECYSTECTOMY     SHOULDER ARTHROSCOPY Left 07/02/2015   Procedure: ,left shoulder arthroscopy, decompression and debridement;  Surgeon: Christena FlakeJohn J Poggi, MD;  Location: [facility] ORS;  Service: Orthopedics;  Laterality: Left;   SHOULDER CLOSED REDUCTION Left 04/09/2015   Procedure: CLOSED MANIPULATION SHOULDER;  Surgeon: Kennedy BuckerMichael Menz, MD;  Location: [facility] ORS;  Service: Orthopedics;  Laterality: Left;   Family History  Problem Relation Age of Onset   Breast cancer Mother [id number]   Alzheimer's disease Mother    Social History   Socioeconomic History   Marital status: Widowed    Spouse name: Not on file   Number of children: 1   Years of education: Not on file   Highest education level: Not on file  Occupational History   Occupation: Retired  Tobacco Use   Smoking status: Former    Packs/day: 0.75    Years: 41.00    Total pack years: 30.75    Types: Cigarettes    Quit date: 12/08/2015    Years since quitting: 6.6   Smokeless tobacco: Never  Vaping Use   Vaping Use: Never used  Substance and Sexual Activity   Alcohol use: No   Drug use: No   Sexual activity: Yes    Birth control/protection: Post-menopausal  Other Topics Concern   Not on file  Social History Narrative   Not on file   Social Determinants of Health   Financial Resource Strain: Medium Risk (08/04/2022)   Overall Financial Resource Strain (CARDIA)    Difficulty of Paying Living  Expenses: Somewhat hard  Food Insecurity: Food Insecurity Present (08/04/2022)   Hunger Vital Sign    Worried About Running Out of Food in the Last Year: Sometimes true    Ran Out of Food in the Last Year: Sometimes true  Transportation Needs: No Transportation Needs (08/04/2022)   PRAPARE - Administrator, Civil ServiceTransportation    Lack of Transportation (Medical): No    Lack of Transportation (Non-Medical): No  Physical Activity: Sufficiently Active (08/04/2022)   Exercise Vital Sign    Days of Exercise per Week: 7 days    Minutes of Exercise per Session: 30 min  Stress: Stress Concern Present (08/04/2022)   [employer] -  Occupational Stress Questionnaire    Feeling of Stress : Rather much  Social Connections: Socially Isolated (08/04/2022)   Social Connection and Isolation Panel [NHANES]    Frequency of Communication with Friends and Family: More than three times a week    Frequency of Social Gatherings with Friends and Family: More than three times a week    Attends Religious Services: Never    Database administrator or Organizations: No    Attends Banker Meetings: Never    Marital Status: Widowed    Tobacco Counseling Former smoker, quit 12/08/2015 Clinical Intake:  Pre-visit preparation completed: No  Pain : No/denies pain     Nutritional Status: BMI of 19-24  Normal Nutritional Risks: Unintentional weight loss  How often do you need to have someone help you when you read instructions, pamphlets, or other written materials from your doctor or pharmacy?: 1 - Never  Diabetic?No   Interpreter Needed?: No  Information entered by :: Balinda Quails   Activities of Daily Living    08/04/2022    1:23 PM  In your present state of health, do you have any difficulty performing the following activities:  Hearing? 0  Vision? 1  Difficulty concentrating or making decisions? 1  Walking or climbing stairs? 1  Dressing or bathing? 0  Doing errands,  shopping? 0    Patient Care Team: [name], DO as PCP - General (Family Medicine)  Indicate any recent Medical Services you may have received from other than [facility] providers in the past year (date may be approximate). No hospitalization in the past 12 months.     Assessment:   This is a routine wellness examination for Brandi [name].  Hearing/Vision screen Denies hearing issue. [facility] Ex[facility], wear glasses  Dietary issues and exercise activities discussed: Current Exercise Habits: Structured exercise class;Home exercise routine, Type of exercise: Other - see comments;walking, Time (Minutes): 30, Frequency (Times/Week): 7, Weekly Exercise (Minutes/Week): 210, Intensity: Mild   Goals Addressed   None    Depression Screen    08/04/2022    1:15 PM 02/12/2022   10:42 AM 08/12/2021   11:07 AM 07/08/2021   10:40 AM 05/06/2021   11:07 AM 02/20/2021   10:33 AM 04/08/2020   10:18 AM  PHQ 2/9 Scores  PHQ - 2 Score 4 0 0 0 0 2 0  PHQ- 9 Score 6 0 0  0 5     Fall Risk    08/04/2022    1:23 PM 02/12/2022   10:42 AM 08/12/2021   11:07 AM 07/08/2021   10:40 AM 05/06/2021   11:07 AM  Fall Risk   Falls in the past year? 0 0 0 0 0  Number falls in past yr: 0 0 0  0  Injury with Fall? 0 0 0  0  Risk for fall due to : No Fall Risks No Fall Risks  Medication side effect   Follow up Falls evaluation completed Falls evaluation completed Falls evaluation completed Falls evaluation completed;Education provided;Falls prevention discussed Falls evaluation completed    FALL RISK PREVENTION PERTAINING TO THE HOME:  Any stairs in or around the home? Yes  If so, are there any without handrails? No  Home free of loose throw rugs in walkways, pet beds, electrical cords, etc? Yes  Adequate lighting in your home to reduce risk of falls? Yes   ASSISTIVE DEVICES UTILIZED TO PREVENT FALLS:  Life alert? No  Use of a cane,  walker or w/c? No  Grab bars in the bathroom? No   Shower chair or bench in shower? No  Elevated toilet seat or a handicapped toilet? No   TIMED UP AND GO:  Was the test performed? .unable to perform, virtual appt.    Cognitive Function:        08/04/2022    1:24 PM 07/08/2021   10:41 AM  6CIT Screen  What Year? 0 points 0 points  What month? 0 points 0 points  What time? 0 points 0 points  Count back from 20 0 points 2 points  Months in reverse 0 points 2 points  Repeat phrase 2 points 0 points  Total Score 2 points 4 points    Immunizations Immunization History  Administered Date(s) Administered   Moderna Sars-Covid-2 Vaccination 05/26/2021, 06/24/2021    TDAP status: Due, Education has been provided regarding the importance of this vaccine. Advised may receive this vaccine at local pharmacy or Health Dept. Aware to provide a copy of the vaccination record if obtained from local pharmacy or Health Dept. Verbalized acceptance and understanding.  Flu Vaccine status: Due, Education has been provided regarding the importance of this vaccine. Advised may receive this vaccine at local pharmacy or Health Dept. Aware to provide a copy of the vaccination record if obtained from local pharmacy or Health Dept. Verbalized acceptance and understanding.  Pneumococcal vaccine status: Declined,  Education has been provided regarding the importance of this vaccine but patient still declined. Advised may receive this vaccine at local pharmacy or Health Dept. Aware to provide a copy of the vaccination record if obtained from local pharmacy or Health Dept. Verbalized acceptance and understanding.   Covid-19 vaccine status: Declined, Education has been provided regarding the importance of this vaccine but patient still declined. Advised may receive this vaccine at local pharmacy or Health Dept.or vaccine clinic. Aware to provide a copy of the vaccination record if obtained from local pharmacy or Health Dept. Verbalized acceptance and  understanding.  Qualifies for Shingles Vaccine? No   Zostavax completed No   Shingrix Completed?: No.    Education has been provided regarding the importance of this vaccine. Patient has been advised to call insurance company to determine out of pocket expense if they have not yet received this vaccine. Advised may also receive vaccine at local pharmacy or Health Dept. Verbalized acceptance and understanding.  Screening Tests Health Maintenance  Topic Date Due   TETANUS/TDAP  Never done   COLONOSCOPY (Pts 45-17yrs Insurance coverage will need to be confirmed)  Never done   Zoster Vaccines- Shingrix (1 of 2) Never done   COVID-19 Vaccine (3 - Moderna series) 08/19/2021   INFLUENZA VACCINE  07/07/2022   Pneumonia Vaccine 42+ Years old (1 - PCV) 11/17/2022 (Originally 10/06/1962)   MAMMOGRAM  12/19/2023   DEXA SCAN  Completed   Hepatitis C Screening  Completed   HPV VACCINES  Aged Out    Health Maintenance  Health Maintenance Due  Topic Date Due   TETANUS/TDAP  Never done   COLONOSCOPY (Pts 45-68yrs Insurance coverage will need to be confirmed)  Never done   Zoster Vaccines- Shingrix (1 of 2) Never done   COVID-19 Vaccine (3 - Moderna series) 08/19/2021   INFLUENZA VACCINE  07/07/2022    Colonscopy: pt declined   Mammogram status: Completed 12/18/2021. Repeat every year DEXA Scan: 01/04/2019  Lung Cancer Screening: (Low Dose CT Chest recommended if Age 17-80 years, 30 pack-year currently smoking OR have quit w/in 15years.)  does not qualify.   Lung Cancer Screening Referral: doesn't qualify  Additional Screening:  Hepatitis C Screening: does qualify; Completed 01/20/2021  Vision Screening: Recommended annual ophthalmology exams for early detection of glaucoma and other disorders of the eye. Is the patient up to date with their annual eye exam?  Yes  Who is the provider or what is the name of the office in which the patient attends annual eye exams? [name]  If  pt is not established with a provider, would they like to be referred to a provider to establish care? No .   Dental Screening: Recommended annual dental exams for proper oral hygiene  Community Resource Referral / Chronic Care Management: CRR required this visit?  Yes   CCM required this visit?  Yes      Plan:     I have personally reviewed and noted the following in the patient's chart:   Medical and social history Use of alcohol, tobacco or illicit drugs  Current medications and supplements including opioid prescriptions. Patient is not currently taking opioid prescriptions. Functional ability and status Nutritional status Physical activity Advanced directives List of other physicians Hospitalizations, surgeries, and ER visits in previous 12 months Vitals Screenings to include cognitive, depression, and falls Referrals and appointments  In addition, I have reviewed and discussed with patient certain preventive protocols, quality metrics, and best practice recommendations. A written personalized care plan for preventive services as well as general preventive health recommendations were provided to patient.    Brandi [name] , Thank you for taking time to come for your Medicare Wellness Visit. I appreciate your ongoing commitment to your health goals. Please review the following plan we discussed and let me know if I can assist you in the future.   These are the goals we discussed:  Goals      CCM Expected Outcome:  Monitor, Self-Manage and Reduce Symptoms of  COPD Emphysema     Patient Stated     07/08/2021, no goals     Pharmacy Goals     Our goal bad cholesterol, or LDL, is less than 70. This is why it is important to continue taking your rosuvastatin and ezetimibe.  Feel free to call me with any questions or concerns. I look forward to our next call!  Estelle Grumbles, PharmD, P[place]y [place], CPP Clinical Pharmacist [facility] [phone]         This is a list of the screening recommended for you and due dates:  Health Maintenance  Topic Date Due   Tetanus Vaccine  Never done   Colon Cancer Screening  Never done   Zoster (Shingles) Vaccine (1 of 2) Never done   COVID-19 Vaccine (3 - Moderna series) 08/19/2021   Flu Shot  07/07/2022   Pneumonia Vaccine (1 - PCV) 11/17/2022*   Mammogram  12/19/2023   DEXA scan (bone density measurement)  Completed   Hepatitis C Screening: USPSTF Recommendation to screen - Ages 70-79 yo.  Completed   HPV Vaccine  Aged Out  *Topic was postponed. The date shown is not the original due date.      Lonna Cobb, CMA   08/04/2022   Nurse Notes: Approximately 40 minute Non-face-to-face visit. The pt complains of a financial strain since her husband passed away. She said she has difficulty paying her utilities and buying groceries and is concerned about her housing stability. Her husband passed away from COVID-19 in 2022/12/31 of last year.  He didn't  have a living will so the house she had been living in over 25 years was split with his stepson. Now the patient is concerned because the stepson just recently drafted a letter from an attorney stating that he is going to put the house up on the market for sale.

## 2022-08-05 ENCOUNTER — Telehealth: Payer: Self-pay

## 2022-08-05 NOTE — Telephone Encounter (Signed)
MAILING RESOURCES   Brandi [name] [facility], [place], Care Management  [phone] [address], [name], [name] [address] Phone: [phone] Email: [name]    

## 2022-09-22 DIAGNOSIS — U071 COVID-19: Secondary | ICD-10-CM | POA: Diagnosis not present

## 2022-09-22 DIAGNOSIS — J9601 Acute respiratory failure with hypoxia: Secondary | ICD-10-CM | POA: Diagnosis not present

## 2022-10-19 ENCOUNTER — Ambulatory Visit (INDEPENDENT_AMBULATORY_CARE_PROVIDER_SITE_OTHER): Payer: No Typology Code available for payment source | Admitting: Internal Medicine

## 2022-10-19 ENCOUNTER — Encounter: Payer: Self-pay | Admitting: Internal Medicine

## 2022-10-19 ENCOUNTER — Ambulatory Visit: Payer: Self-pay

## 2022-10-19 VITALS — BP 124/86 | HR 92 | Temp 96.6°F | Wt 127.0 lb

## 2022-10-19 DIAGNOSIS — J449 Chronic obstructive pulmonary disease, unspecified: Secondary | ICD-10-CM

## 2022-10-19 DIAGNOSIS — R0602 Shortness of breath: Secondary | ICD-10-CM

## 2022-10-19 DIAGNOSIS — J069 Acute upper respiratory infection, unspecified: Secondary | ICD-10-CM | POA: Diagnosis not present

## 2022-10-19 MED ORDER — PREDNISONE 10 MG PO TABS: ORAL_TABLET | ORAL | 0 refills | Status: DC

## 2022-10-19 NOTE — Progress Notes (Signed)
Subjective:    Patient ID: Brandi [name], female    DOB: 1956/05/11, 66 y.o.   MRN: [id number]  HPI  Patient presents to clinic today with complaint of sinus pressure, runny nose, nasal congestion and shortness of breath.  This started 1 week ago. She is blowing clear mucous out of her nose. She denies headache, ear pain, sore throat, cough, chest pain, nausea or vomiting. She denies fever, chills or body aches. 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 She has a history of COPD, managed on Breztri and Albuterol. She has not had sick contacts that she is aware of.   Review of Systems     Past Medical History:  Diagnosis Date   Hyperlipidemia     Current Outpatient Medications  Medication Sig Dispense Refill   albuterol (PROVENTIL) (2.5 MG/3ML) 0.083% nebulizer solution Take 3 mLs (2.5 mg total) by nebulization every 6 (six) hours as needed for wheezing or shortness of breath. 75 mL 12   albuterol (VENTOLIN HFA) 108 (90 Base) MCG/ACT inhaler INHALE 2 PUFFS INTO THE LUNGS EVERY 4 HOURS AS NEEDED FOR WHEEZING OR SHORTNESS OF BREATH 8.5 g 2   alendronate (FOSAMAX) 70 MG tablet TAKE ONE TABLET BY MOUTH EACH WEEK, ON AN EMPTY STOMACH BEFORE BREAKFAST WITH 8oz OF WATER AND REMAIN UPRIGHT FOR :30 4 tablet 11   alum & mag hydroxide-simeth (MAALOX/MYLANTA) 200-200-20 MG/5ML suspension Take 30 mLs by mouth every 4 (four) hours as needed for indigestion or heartburn. 355 mL 0   amoxicillin (AMOXIL) 500 MG capsule Take 500 mg by mouth 3 (three) times daily. (Patient not taking: Reported on 08/04/2022)     aspirin EC 81 MG tablet Take 1 tablet (81 mg total) by mouth daily. 90 tablet 3   azithromycin (ZITHROMAX) 250 MG tablet Take 250 mg by mouth as directed. (Patient not taking: Reported on 08/04/2022)     BREZTRI AEROSPHERE 160-9-4.8 MCG/ACT AERO USE 2 INHALATION MY MOIUTH TWICE A DAY RINSE MOUTH AFTER EACH USE 10.7 g 3   busPIRone (BUSPAR) 5 MG tablet TAKE 1 TABLET  BY MOUTH TWICE DAILY AS NEEDED FOR ANXIETY 60 tablet 2   Calcium Carbonate-Vit D-Min (CALCIUM 1200) 1200-1000 MG-UNIT CHEW Chew 1,200 mg by mouth daily. 30 each 11   Cholecalciferol (VITAMIN D3) 125 MCG (5000 UT) TABS Take by mouth. Vitamin D3 5,000 IU daily for 12 weeks then reduce to OTC Vitamin D3 2,000 IU daily for maintenance     ezetimibe (ZETIA) 10 MG tablet Take 1 tablet (10 mg total) by mouth daily. 90 tablet 3   famotidine (PEPCID) 20 MG tablet Take 1 tablet (20 mg total) by mouth 2 (two) times daily. (Patient taking differently: Take 20 mg by mouth 2 (two) times daily as needed.) 180 tablet 3   feeding supplement (ENSURE ENLIVE / ENSURE PLUS) LIQD Take 237 mLs by mouth 2 (two) times daily between meals. 237 mL 12   mometasone (ELOCON) 0.1 % lotion Apply topically.     Multiple Vitamin (MULTIVITAMIN WITH MINERALS) TABS tablet Take 1 tablet by mouth daily.     omeprazole (PRILOSEC) 20 MG capsule Take 1 capsule (20 mg total) by mouth 2 (two) times daily before a meal. 180 capsule 3   ondansetron (ZOFRAN-ODT) 4 MG disintegrating tablet Take 1 tablet (4 mg total) by mouth every 8 (eight) hours as needed for nausea or vomiting. 30 tablet 0   OXYGEN Inhale 3 L into the lungs at bedtime  as needed.     rosuvastatin (CRESTOR) 10 MG tablet Take 1 tablet (10 mg total) by mouth daily. 90 tablet 3   sodium chloride (OCEAN) 0.65 % SOLN nasal spray Place 1 spray into both nostrils as needed for congestion.     Specialty Vitamins Products (HAIR NOURISHING SUPPLEMENT PO) Take 1 tablet by mouth daily.     No current facility-administered medications for this visit.    No Known Allergies  Family History  Problem Relation Age of Onset   Breast cancer Mother 66   Alzheimer's disease Mother     Social History   Socioeconomic History   Marital status: Widowed    Spouse name: Not on file   Number of children: 1   Years of education: Not on file   Highest education level: Not on file  Occupational  History   Occupation: Retired  Tobacco Use   Smoking status: Former    Packs/day: 0.75    Years: 41.00    Total pack years: 30.75    Types: Cigarettes    Quit date: 12/08/2015    Years since quitting: 6.8   Smokeless tobacco: Never  Vaping Use   Vaping Use: Never used  Substance and Sexual Activity   Alcohol use: No   Drug use: No   Sexual activity: Yes    Birth control/protection: Post-menopausal  Other Topics Concern   Not on file  Social History Narrative   Not on file   Social Determinants of Health   Financial Resource Strain: Medium Risk (08/04/2022)   Overall Financial Resource Strain (CARDIA)    Difficulty of Paying Living Expenses: Somewhat hard  Food Insecurity: Food Insecurity Present (08/04/2022)   Hunger Vital Sign    Worried About Running Out of Food in the Last Year: Sometimes true    Ran Out of Food in the Last Year: Sometimes true  Transportation Needs: No Transportation Needs (08/04/2022)   PRAPARE - Administrator, Civil Service (Medical): No    Lack of Transportation (Non-Medical): No  Physical Activity: Sufficiently Active (08/04/2022)   Exercise Vital Sign    Days of Exercise per Week: 7 days    Minutes of Exercise per Session: 30 min  Stress: Stress Concern Present (08/04/2022)   [employer] of Occupational Health - Occupational Stress Questionnaire    Feeling of Stress : Rather much  Social Connections: Socially Isolated (08/04/2022)   Social Connection and Isolation Panel [NHANES]    Frequency of Communication with Friends and Family: More than three times a week    Frequency of Social Gatherings with Friends and Family: More than three times a week    Attends Religious Services: Never    Database administrator or Organizations: No    Attends Banker Meetings: Never    Marital Status: Widowed  Intimate Partner Violence: Not on file     Constitutional: Denies fever, malaise, fatigue, headache or abrupt weight  changes.  HEENT: Pt reports runny nose, nasal congestion, sinus pressure. Denies eye pain, eye redness, ear pain, ringing in the ears, wax buildup,  bloody nose, or sore throat. Respiratory: Patient reports shortness of breath.  Denies difficulty breathing, cough or sputum production.   Cardiovascular: Denies chest pain, chest tightness, palpitations or swelling in the hands or feet.  Gastrointestinal: Denies abdominal pain, bloating, constipation, diarrhea or blood in the stool.   No other specific complaints in a complete review of systems (except as listed in HPI above).  Objective:  Physical Exam   BP 124/86 (BP Location: Right Arm, Patient Position: Sitting, Cuff Size: Normal)   Pulse 92   Temp (!) 96.6 F (35.9 C) (Temporal)   Wt 127 lb (57.6 kg)   SpO2 96%   BMI 21.80 kg/m   Wt Readings from Last 3 Encounters:  04/22/22 124 lb 6.4 oz (56.4 kg)  03/11/22 126 lb (57.2 kg)  02/12/22 126 lb 3.2 oz (57.2 kg)    General: Appears her stated age, well developed, well nourished in NAD. HEENT: Head: normal shape and size no sinus tenderness noted; Throat/Mouth: Teeth present, mucosa pink and moist, no exudate, lesions or ulcerations noted.  Neck:  No adenopathy noted. Cardiovascular: Normal rate and rhythm. S1,S2 noted.  No murmur, rubs or gallops noted.  Pulmonary/Chest: Normal effort and diminshed vesicular breath sounds. No respiratory distress. No wheezes, rales or ronchi noted.    BMET    Component Value Date/Time   NA 140 11/17/2021 1239   K 4.2 11/17/2021 1239   CL 102 11/17/2021 1239   CO2 24 11/17/2021 1239   GLUCOSE 74 11/17/2021 1239   GLUCOSE 90 01/20/2021 0801   BUN 12 11/17/2021 1239   CREATININE 0.87 11/17/2021 1239   CREATININE 0.58 01/20/2021 0801   CALCIUM 9.6 11/17/2021 1239   GFRNONAA 97 01/20/2021 0801   GFRAA 112 01/20/2021 0801    Lipid Panel     Component Value Date/Time   CHOL 165 12/22/2021 0952   TRIG 64 12/22/2021 0952   HDL 63  12/22/2021 0952   CHOLHDL 2.6 12/22/2021 0952   CHOLHDL 3.1 01/20/2021 0801   LDLCALC 89 12/22/2021 0952   LDLCALC 111 (H) 01/20/2021 0801    CBC    Component Value Date/Time   WBC 6.3 01/20/2021 0801   RBC 3.85 01/20/2021 0801   HGB 12.1 01/20/2021 0801   HGB 14.1 11/10/2018 1043   HCT 36.2 01/20/2021 0801   HCT 40.3 11/10/2018 1043   PLT 475 (H) 01/20/2021 0801   PLT 287 11/10/2018 1043   MCV 94.0 01/20/2021 0801   MCV 92 11/10/2018 1043   MCH 31.4 01/20/2021 0801   MCHC 33.4 01/20/2021 0801   RDW 12.4 01/20/2021 0801   RDW 11.8 (L) 11/10/2018 1043   LYMPHSABS 2,262 01/20/2021 0801   MONOABS 0.3 12/20/2020 0437   EOSABS 189 01/20/2021 0801   BASOSABS 101 01/20/2021 0801    Hgb A1C Lab Results  Component Value Date   HGBA1C 5.8 (H) 01/20/2021           Assessment & Plan:   Viral URI:  Influenza negative Covid negative Encourage rest and fluids Rx for Pred taper for symptom management She is requesting a Rx for a sulfa antibiotic, advised her this is not indicated at this time Continue inhalers as previously prescribed  Follow-up with your PCP as previously scheduled Nicki Reaper, NP

## 2022-10-19 NOTE — Patient Instructions (Signed)
Chronic Obstructive Pulmonary Disease  Chronic obstructive pulmonary disease (COPD) is a long-term (chronic) condition that affects the lungs. COPD is a general term that can be used to describe many different lung problems that cause lung inflammation and limit airflow, including chronic bronchitis and emphysema. If you have COPD, your lung function will probably never return to normal. In most cases, it gets worse over time. However, there are steps you can take to slow the progression of the disease and improve your quality of life. What are the causes? This condition may be caused by: Smoking. This is the most common cause. Certain genes passed down through families. What increases the risk? The following factors may make you more likely to develop this condition: Being exposed to secondhand smoke from cigarettes, pipes, or cigars. Being exposed to chemicals and other irritants, such as fumes and dust in the work environment. Having chronic lung conditions or infections. What are the signs or symptoms? Symptoms of this condition include: Shortness of breath, especially during physical activity. Chronic cough with a large amount of thick mucus. Sometimes, the cough may not have any mucus (dry cough). Wheezing and rapid breathing. Gray or bluish discoloration (cyanosis) of the skin, especially in the fingers, toes, or lips. Feeling tired (fatigue). Weight loss. Chest tightness. Frequent infections. Episodes when breathing symptoms become much worse (exacerbations). At the later stages of this disease, you may have swelling in the ankles, feet, or legs. How is this diagnosed? This condition is diagnosed based on: Your medical history. A physical exam. You may also have tests, including: Lung (pulmonary) function tests. This may include a spirometry test, which measures your ability to exhale properly. Chest X-ray. CT scan. Blood tests. How is this treated? This condition may be  treated with: Medicines. These may include inhaled rescue medicines to treat acute exacerbations as well as medicines that you take long-term (maintenance medicines) to prevent flare-ups of COPD. Bronchodilators help treat COPD by dilating the airways to allow increased airflow and make your breathing more comfortable. Steroids can reduce airway inflammation and help prevent exacerbations. Smoking cessation. If you smoke, your health care provider may ask you to quit, and may also recommend therapy or replacement products to help you quit. Pulmonary rehabilitation. This may involve working with a team of health care providers and specialists, such as respiratory, occupational, and physical therapists. Exercise and physical activity. These are beneficial for nearly all people with COPD. Nutrition therapy to gain weight, if you are underweight. Oxygen. Supplemental oxygen therapy is only helpful if you have a low oxygen level in your blood (hypoxemia). Lung surgery or transplant. Palliative care. This is to help people with COPD feel comfortable when treatment is no longer working. Follow these instructions at home: Medicines Take over-the-counter and prescription medicines only as told by your health care provider. This includes inhaled medicines and pills. Talk to your health care provider before taking any cough or allergy medicines. You may need to avoid certain medicines that dry out your airways. Lifestyle If you smoke, the most important thing that you can do is to stop smoking. Continuing to smoke will cause the disease to progress faster. Do not use any products that contain nicotine or tobacco. These products include cigarettes, chewing tobacco, and vaping devices, such as e-cigarettes. If you need help quitting, ask your health care provider. Avoid exposure to things that irritate your lungs, such as smoke, chemicals, and fumes. Stay active, but balance activity with periods of rest.  Exercise and   physical activity will help you maintain your ability to do things you want to do. Learn and use relaxation techniques to manage stress and to control your breathing. Get the right amount of sleep and get quality sleep. Most adults need 7 or more hours per night. Eat healthy foods. Eating smaller, more frequent meals and resting before meals may help you maintain your strength. Controlled breathing Learn and use controlled breathing techniques as directed by your health care provider. Controlled breathing techniques include: Pursed lip breathing. Start by breathing in (inhaling) through your nose for 1 second. Then, purse your lips as if you were going to whistle and breathe out (exhale) through the pursed lips for 2 seconds. Diaphragmatic breathing. Start by putting one hand on your abdomen just above your waist. Inhale slowly through your nose. The hand on your abdomen should move out. Then purse your lips and exhale slowly. You should be able to feel the hand on your abdomen moving in as you exhale.  Controlled coughing Learn and use controlled coughing to clear mucus from your lungs. Controlled coughing is a series of short, progressive coughs. The steps of controlled coughing are: Lean your head slightly forward. Breathe in deeply using diaphragmatic breathing. Try to hold your breath for 3 seconds. Keep your mouth slightly open while coughing twice. Spit any mucus out into a tissue. Rest and repeat the steps once or twice as needed. General instructions Make sure you receive all the vaccines that your health care provider recommends, especially the pneumococcal and influenza vaccines. Preventing infection and hospitalization is very important when you have COPD. Drink enough fluid to keep your urine pale yellow, unless you have a medical condition that requires fluid restriction. Use oxygen therapy and pulmonary rehabilitation if told by your health care provider. If you  require home oxygen therapy, ask your health care provider whether you should purchase a pulse oximeter to measure your oxygen level at home. Work with your health care provider to develop a COPD action plan. This will help you know what steps to take if your condition gets worse. Keep other chronic health conditions under control as told by your health care provider. Avoid extreme temperature and humidity changes. Avoid contact with people who have an illness that spreads from person to person (is contagious), such as viral infections or pneumonia. Keep all follow-up visits. This is important. Contact a health care provider if: You are coughing up more mucus than usual. There is a change in the color or thickness of your mucus. Your breathing is more labored than usual. Your breathing is faster than usual. You have difficulty sleeping. You need to use your rescue medicines or inhalers more often than expected. You have trouble doing routine activities such as getting dressed or walking around the house. Get help right away if: You have shortness of breath while you are resting. You have shortness of breath that prevents you from: Being able to talk. Performing your usual physical activities. You have chest pain lasting longer than 5 minutes. Your skin color is more blue (cyanotic) than usual. You measure low oxygen saturations for longer than 5 minutes with a pulse oximeter. You have a fever. You feel too tired to breathe normally. These symptoms may represent a serious problem that is an emergency. Do not wait to see if the symptoms will go away. Get medical help right away. Call your local emergency services (911 in the [place] Do not drive yourself to the hospital. Summary Chronic obstructive   pulmonary disease (COPD) is a long-term (chronic) condition that affects the lungs. Your lung function will probably never return to normal. In most cases, it gets worse over time. However, there  are steps you can take to slow the progression of the disease and improve your quality of life. Treatment for COPD may include taking medicines, quitting smoking, pulmonary rehabilitation, and changes to diet and exercise. As the disease progresses, you may need oxygen therapy, a lung transplant, or palliative care. To help manage your condition, do not smoke, avoid exposure to things that irritate your lungs, stay up to date on all vaccines, and follow your health care provider's instructions for taking medicines. This information is not intended to replace advice given to you by your health care provider. Make sure you discuss any questions you have with your health care provider. Document Revised: 10/01/2020 Document Reviewed: 10/01/2020 Elsevier Patient Education  2023 [employer]  

## 2022-10-19 NOTE — Telephone Encounter (Signed)
  Chief Complaint: SOB Symptoms: SOB sitting-  Frequency: last week but was intermittent Pertinent Negatives: Patient denies fever or change in sputum Disposition: [] ED /[] Urgent Care (no appt availability in office) / [x] Appointment(In office/virtual)/ []  [facility]/ [] Home Care/ [] Refused Recommended Disposition /[] [place]/ []  Follow-up with PCP Additional Notes: pt given appt at 1520 Pt wanted sooner appt- pt was scheduled on Tuesday.  Reason for Disposition . [1] MODERATE difficulty breathing (e.g., speaks in phrases, SOB even at rest) AND [2] worse than normal  Answer Assessment - Initial Assessment Questions 1. MAIN CONCERN OR SYMPTOM: "What's your main concern?" (e.g., low oxygen level, breathing difficulty) "What question do you have?"     SOB 2.  OXYGEN EQUIPMENT:  Are you having trouble with your oxygen equipment?  (e.g., cannula, mask, tubing, tank, concentrator)     *No Answer* 3. ONSET: "When did the  *No Answer*  start?"      *No Answer* 4. OXYGEN THERAPY:    - "Do you currently use home oxygen?" (e.g., yes, no).    - If Yes, ask: "What is your oxygen source?" (e.g., O2 tank, O2 concentrator).    - If Yes, ask: "How do you get the oxygen?" (e.g., nasal prongs, face mask).    - If Yes, ask: "How much oxygen are you supposed to use?" (e.g., 1-2 L [place])     Yes- on 4 LPM 5. PULSE OXIMETER:    - "Do you have a pulse oximeter (pulse ox)?"  (e.g., yes, no)    - If Yes, ask: "Where do you place the probe?" (e.g., fingertip, ear lobe)     Yes- wont get 6. O2 MONITORING: "What is the oxygen level (pulse ox reading)?" (e.g., 70-100%)     Pt does not know 7. VSS MONITORING: "Do you monitor/measure your oxygen level or vital signs (e.g., yes, no, measurements are automatically sent to provider/call center). Document CURRENT and NORMAL BASELINE values if available.     -  P: "What is your pulse rate per minute?"   -  RR: "What is your respiratory rate per  minute?"     *No Answer* 8. BREATHING DIFFICULTY: "Are you having any difficulty breathing?" If Yes, ask: "How bad is it?"  (e.g., none, mild, moderate, severe)   - MILD: No SOB at rest, mild SOB with walking, speaks normally in sentences, able to lie down, no retractions, pulse < 100.   - MODERATE: SOB at rest, SOB with minimal exertion and prefers to sit, cannot lie down flat, speaks in phrases, mild retractions, audible wheezing, pulse 100-120.   - SEVERE: Very SOB at rest, speaks in single words, struggling to breathe, sitting hunched forward, retractions, pulse > 120      moderate 9. OTHER SYMPTOMS: "Do you have any other symptoms?" (e.g., fever, change in sputum)     Same sx last week and fever 10. SMOKING: "Do you smoke currently?" "Is there anyone that smokes around you?"  (Note: smoking around oxygen is dangerous!)       *No Answer*  Protocols used: COPD Oxygen Monitoring and Hypoxia-A-AH

## 2022-10-20 ENCOUNTER — Ambulatory Visit: Payer: No Typology Code available for payment source | Admitting: Family Medicine

## 2022-10-23 DIAGNOSIS — U071 COVID-19: Secondary | ICD-10-CM | POA: Diagnosis not present

## 2022-10-23 DIAGNOSIS — J9601 Acute respiratory failure with hypoxia: Secondary | ICD-10-CM | POA: Diagnosis not present

## 2022-10-30 ENCOUNTER — Ambulatory Visit
Admission: EM | Admit: 2022-10-30 | Discharge: 2022-10-30 | Disposition: A | Discharge status: Home / Self Care | Payer: No Typology Code available for payment source | Attending: Internal Medicine | Admitting: Internal Medicine

## 2022-10-30 ENCOUNTER — Ambulatory Visit: Payer: Self-pay | Admitting: *Deleted

## 2022-10-30 DIAGNOSIS — T3695XA Adverse effect of unspecified systemic antibiotic, initial encounter: Secondary | ICD-10-CM | POA: Insufficient documentation

## 2022-10-30 DIAGNOSIS — B379 Candidiasis, unspecified: Secondary | ICD-10-CM | POA: Insufficient documentation

## 2022-10-30 DIAGNOSIS — L03113 Cellulitis of right upper limb: Secondary | ICD-10-CM | POA: Diagnosis not present

## 2022-10-30 LAB — URINALYSIS, ROUTINE W REFLEX MICROSCOPIC
Bilirubin Urine: NEGATIVE
Glucose, UA: NEGATIVE mg/dL
Hgb urine dipstick: NEGATIVE
Ketones, ur: NEGATIVE mg/dL
Nitrite: NEGATIVE
Protein, ur: NEGATIVE mg/dL
Specific Gravity, Urine: 1.015 (ref 1.005–1.030)
pH: 7.5 (ref 5.0–8.0)

## 2022-10-30 LAB — WET PREP, GENITAL
Clue Cells Wet Prep HPF POC: NONE SEEN
Sperm: NONE SEEN
Trich, Wet Prep: NONE SEEN
WBC, Wet Prep HPF POC: >10 — AB (ref <10)

## 2022-10-30 LAB — URINALYSIS, MICROSCOPIC (REFLEX): RBC / HPF: NONE SEEN RBC/hpf (ref 0–5)

## 2022-10-30 MED ORDER — SULFAMETHOXAZOLE-TRIMETHOPRIM 800-160 MG PO TABS: 1.0000 | ORAL_TABLET | Freq: Two times a day (BID) | ORAL | 0 refills | Status: AC

## 2022-10-30 MED ORDER — FLUCONAZOLE 150 MG PO TABS: 150.0000 mg | ORAL_TABLET | Freq: Every day | ORAL | 0 refills | Status: DC

## 2022-10-30 NOTE — ED Provider Notes (Signed)
[facility]    CSN: [id number] Arrival date & time: 10/30/22  1011      History   Chief Complaint Chief Complaint  Patient presents with   Vaginal Pain   Hand Problem          HPI Brandi [name] is a 66 y.o. female with a history of COPD, HLD, aortic atherosclerosis and CAD presents to UC with complaint of insect bite to her right hand.  She reports this occurred yesterday.  Since that time she reports pain, swelling and redness of the affected area.  She denies fever, chills, nausea or vomiting.  She has been using triamcinolone cream with minimal relief of symptoms.  She also reports vaginal itching.  This started yesterday.  She denies pelvic pain, vaginal discharge, vaginal odor, abnormal vaginal bleeding, urinary urgency, frequency, dysuria or blood in her urine.  She thinks she has a yeast infection.  She was recently on antibiotics for COPD exacerbation.  She has tried Cranberry OTC with minimal relief of symptoms.  HPI  Past Medical History:  Diagnosis Date   Hyperlipidemia     Patient Active Problem List   Diagnosis Date Noted   Pre-diabetes 04/08/2021   Vitamin D deficiency 04/08/2021   COVID-19 vaccination declined 12/04/2020   Centrilobular emphysema ([place]) 04/08/2020   Nodule of upper lobe of left lung 04/08/2020   Aortic atherosclerosis ([place]) 05/21/2019   Coronary artery calcification seen on CT scan 05/21/2019   Former smoker 05/21/2019   Hyperlipidemia 05/21/2019    Past Surgical History:  Procedure Laterality Date   APPENDECTOMY     CHOLECYSTECTOMY     SHOULDER ARTHROSCOPY Left 07/02/2015   Procedure: ,left shoulder arthroscopy, decompression and debridement;  Surgeon: Corky Mull, MD;  Location: [facility];  Service: Orthopedics;  Laterality: Left;   SHOULDER CLOSED REDUCTION Left 04/09/2015   Procedure: CLOSED MANIPULATION SHOULDER;  Surgeon: Hessie Knows, MD;  Location: [facility];  Service: Orthopedics;  Laterality: Left;    OB History      Gravida  1   Para  1   Term  1   Preterm      AB      Living  1      SAB      IAB      Ectopic      Multiple      Live Births  1            Home Medications    Prior to Admission medications   Medication Sig Start Date End Date Taking? Authorizing Provider  albuterol (PROVENTIL) (2.5 MG/3ML) 0.083% nebulizer solution Take 3 mLs (2.5 mg total) by nebulization every 6 (six) hours as needed for wheezing or shortness of breath. 11/17/21  Yes Tyler Pita, MD  albuterol (VENTOLIN HFA) 108 (90 Base) MCG/ACT inhaler INHALE 2 PUFFS INTO THE LUNGS EVERY 4 HOURS AS NEEDED FOR WHEEZING OR SHORTNESS OF BREATH 08/04/22  Yes [name], Devonne Doughty, DO  alendronate (FOSAMAX) 70 MG tablet TAKE ONE TABLET BY MOUTH EACH WEEK, ON AN EMPTY STOMACH BEFORE BREAKFAST WITH 8oz OF WATER AND REMAIN UPRIGHT FOR :30 11/10/21  Yes [name], [name], MD  alum & mag hydroxide-simeth (MAALOX/MYLANTA) 200-200-20 MG/5ML suspension Take 30 mLs by mouth every 4 (four) hours as needed for indigestion or heartburn. 12/23/20  Yes Ezekiel Slocumb, DO  aspirin EC 81 MG tablet Take 1 tablet (81 mg total) by mouth daily. 05/22/19  Yes Gollan, Kathlene November, MD  Judithann Sauger  AEROSPHERE 160-9-4.8 MCG/ACT AERO USE 2 INHALATION MY MOIUTH TWICE A DAY RINSE MOUTH AFTER EACH USE 08/04/22  Yes [name], [name], DO  busPIRone (BUSPAR) 5 MG tablet TAKE 1 TABLET BY MOUTH TWICE DAILY AS NEEDED FOR ANXIETY 05/22/22  Yes [name], [name], DO  Calcium Carbonate-Vit D-Min (CALCIUM 1200) 1200-1000 MG-UNIT CHEW Chew 1,200 mg by mouth daily. 01/31/19  Yes [name], [name], MD  Cholecalciferol (VITAMIN D3) 125 MCG (5000 UT) TABS Take by mouth. Vitamin D3 5,000 IU daily for 12 weeks then reduce to OTC Vitamin D3 2,000 IU daily for maintenance   Yes [provider]  ezetimibe (ZETIA) 10 MG tablet Take 1 tablet (10 mg total) by mouth daily. 04/15/21  Yes Gollan, Tollie Pizza, MD  feeding supplement (ENSURE  ENLIVE / ENSURE PLUS) LIQD Take 237 mLs by mouth 2 (two) times daily between meals. 12/23/20  Yes [name], DO  fluconazole (DIFLUCAN) 150 MG tablet Take 1 tablet (150 mg total) by mouth daily. 10/30/22  Yes Keiry Kowal, Salvadore Oxford, NP  mometasone (ELOCON) 0.1 % lotion Apply topically. 02/11/22  Yes [provider]  Multiple Vitamin (MULTIVITAMIN WITH MINERALS) TABS tablet Take 1 tablet by mouth daily. 12/24/20  Yes [name], DO  omeprazole (PRILOSEC) 20 MG capsule Take 1 capsule (20 mg total) by mouth 2 (two) times daily before a meal. 06/23/22  Yes [name], [name], DO  OXYGEN Inhale 3 L into the lungs at bedtime as needed.   Yes [provider]  rosuvastatin (CRESTOR) 10 MG tablet Take 1 tablet (10 mg total) by mouth daily. 02/09/22  Yes Gollan, Tollie Pizza, MD  sodium chloride (OCEAN) 0.65 % SOLN nasal spray Place 1 spray into both nostrils as needed for congestion.   Yes [provider]  Specialty Vitamins Products (HAIR NOURISHING SUPPLEMENT PO) Take 1 tablet by mouth daily.   Yes [provider]  sulfamethoxazole-trimethoprim (BACTRIM DS) 800-160 MG tablet Take 1 tablet by mouth 2 (two) times daily for 5 days. 10/30/22 11/04/22 Yes Sabri Teal, Salvadore Oxford, NP  amoxicillin (AMOXIL) 500 MG capsule Take 500 mg by mouth 3 (three) times daily. Patient not taking: Reported on 08/04/2022 02/10/22   [provider]  azithromycin (ZITHROMAX) 250 MG tablet Take 250 mg by mouth as directed. Patient not taking: Reported on 08/04/2022 04/06/22   [provider]  famotidine (PEPCID) 20 MG tablet Take 1 tablet (20 mg total) by mouth 2 (two) times daily. Patient taking differently: Take 20 mg by mouth 2 (two) times daily as needed. 10/22/21   [name], [name], DO  ondansetron (ZOFRAN-ODT) 4 MG disintegrating tablet Take 1 tablet (4 mg total) by mouth every 8 (eight) hours as needed for nausea or vomiting. 03/11/22   [name], [name], DO   predniSONE (DELTASONE) 10 MG tablet Take 6 tabs on day 1, 5 tabs on day 2, 4 tabs on day 3, 3 tabs on day 4, 2 tabs on day 5, 1 tab on day 6 10/19/22   Lorre Munroe, NP    Family History Family History  Problem Relation Age of Onset   Breast cancer Mother 69   Alzheimer's disease Mother     Social History Social History   Tobacco Use   Smoking status: Former    Packs/day: 0.75    Years: 41.00    Total pack years: 30.75    Types: Cigarettes    Quit date: 12/08/2015    Years since quitting: 6.8   Smokeless  tobacco: Never  Vaping Use   Vaping Use: Never used  Substance Use Topics   Alcohol use: No   Drug use: No     Allergies   Patient has no known allergies.   Review of Systems Review of Systems   Constitutional: Denies fever, malaise, fatigue, headache or abrupt weight changes.  Respiratory: Denies difficulty breathing, shortness of breath, cough or sputum production.   Cardiovascular: Denies chest pain, chest tightness, palpitations or swelling in the hands or feet.  Gastrointestinal: Denies abdominal pain, bloating, constipation, diarrhea or blood in the stool.  GU: Patient reports vaginal itching.  Denies urgency, frequency, pain with urination, burning sensation, blood in urine, odor or discharge. Musculoskeletal: Patient reports right hand swelling.  Denies decrease in range of motion, difficulty with gait, muscle pain or joint swelling.  Skin: Patient reports insect bite of right hand with associated redness and swelling.  Denies rashes or ulcerations.    No other specific complaints in a complete review of systems (except as listed in HPI above).  Physical Exam Triage Vital Signs ED Triage Vitals  Enc Vitals Group     BP 10/30/22 1036 137/83     Pulse Rate 10/30/22 1036 82     Resp 10/30/22 1036 18     Temp 10/30/22 1036 98.7 F (37.1 C)     Temp Source 10/30/22 1036 Oral     SpO2 10/30/22 1036 96 %     Weight 10/30/22 1033 122 lb (55.3 kg)      Height 10/30/22 1033 5\' 4"  (1.626 m)     Head Circumference --      Peak Flow --      Pain Score 10/30/22 1032 9     Pain Loc --      Pain Edu? --      Excl. in [place]? --    No data found.  Updated Vital Signs BP 137/83 (BP Location: Left Arm)   Pulse 82   Temp 98.7 F (37.1 C) (Oral)   Resp 18   Ht 5\' 4"  (1.626 m)   Wt 122 lb (55.3 kg)   SpO2 96%   BMI 20.94 kg/m      Physical Exam  BP 137/83 (BP Location: Left Arm)   Pulse 82   Temp 98.7 F (37.1 C) (Oral)   Resp 18   Ht 5\' 4"  (1.626 m)   Wt 122 lb (55.3 kg)   SpO2 96%   BMI 20.94 kg/m  Wt Readings from Last 3 Encounters:  10/30/22 122 lb (55.3 kg)  10/19/22 127 lb (57.6 kg)  04/22/22 124 lb 6.4 oz (56.4 kg)    General: Appears her stated age, chronic ill-appearing, in NAD. Skin: Raised area noted at the webbing in between the thumb and second finger.  She has redness of the dorsal aspect of the right hand overlying the first and second metacarpal. Cardiovascular: Normal rate.  Radial pulse 2+ on the right. Pulmonary/Chest: Normal effort. Abdomen: Nontender.  Musculoskeletal: Normal flexion, extension, rotation of the right wrist.  Normal flexion extension of the fingers.  1+ swelling noted of the right hand.  Handgrips equal. Neurological: Alert and oriented.     UC Treatments / Results  Labs  Labs Reviewed  WET PREP, GENITAL - Abnormal; Notable for the following components:      Result Value   Yeast Wet Prep HPF POC PRESENT (*)    WBC, Wet Prep HPF POC >10 (*)    All other components within  normal limits  URINALYSIS, ROUTINE W REFLEX MICROSCOPIC - Abnormal; Notable for the following components:   APPearance HAZY (*)    Leukocytes,Ua SMALL (*)    All other components within normal limits  URINALYSIS, MICROSCOPIC (REFLEX) - Abnormal; Notable for the following components:   Bacteria, UA RARE (*)    All other components within normal limits    Medications Ordered in UC Meds ordered this encounter   Medications   sulfamethoxazole-trimethoprim (BACTRIM DS) 800-160 MG tablet    Sig: Take 1 tablet by mouth 2 (two) times daily for 5 days.    Dispense:  10 tablet    Refill:  0    Order Specific Question:   Supervising Provider    Answer:   Chase Picket [id number]   fluconazole (DIFLUCAN) 150 MG tablet    Sig: Take 1 tablet (150 mg total) by mouth daily.    Dispense:  1 tablet    Refill:  0    Order Specific Question:   Supervising Provider    Answer:   Chase Picket [id number]     Initial Impression / Assessment and Plan / UC Course  I have reviewed the triage vital signs and the nursing notes.  Pertinent labs & imaging results that were available during my care of the patient were reviewed by me and considered in my medical decision making (see chart for details).     66 year old female with complaint of insect bite to her right hand with associated redness and swelling as well as vaginal itching.  Exam is consistent with superficially cellulitis of the right hand.  We will treat with Septra DS 160-80 mg 3 BID x 5 days Encouraged elevation and warm compresses for comfort.  Urinalysis shows.  Wet prep shows yeast, RX for Diflucan 150 mg PO x 1.  She will follow-up with her PCP if symptoms persist or worsen. Final Clinical Impressions(s) / UC Diagnoses   Final diagnoses:  Cellulitis of right hand  Antibiotic-induced yeast infection     Discharge Instructions      You were seen today for right hand redness and swelling after a bite.  You have a skin infection called cellulitis.  I am putting you on antibiotics twice daily for the next 5 days.  I have also sent in Diflucan for antibiotic induced yeast infection.  You did not have any evidence of a UTI.  Please follow-up with your PCP if symptoms persist or worsen.     ED Prescriptions     Medication Sig Dispense Auth. Provider   sulfamethoxazole-trimethoprim (BACTRIM DS) 800-160 MG tablet Take 1 tablet by mouth 2  (two) times daily for 5 days. 10 tablet Jearld Fenton, NP   fluconazole (DIFLUCAN) 150 MG tablet Take 1 tablet (150 mg total) by mouth daily. 1 tablet Jearld Fenton, NP      PDMP not reviewed this encounter.   Jearld Fenton, NP 10/30/22 1116

## 2022-10-30 NOTE — Discharge Instructions (Signed)
You were seen today for right hand redness and swelling after a bite.  You have a skin infection called cellulitis.  I am putting you on antibiotics twice daily for the next 5 days.  I have also sent in Diflucan for antibiotic induced yeast infection.  You did not have any evidence of a UTI.  Please follow-up with your PCP if symptoms persist or worsen.

## 2022-10-30 NOTE — Telephone Encounter (Signed)
   Reason for Disposition  [1] SEVERE bite pain AND [2] not improved after 2 hours of pain medicine  Answer Assessment - Initial Assessment Questions 1. TYPE of INSECT: "What type of insect was it?"      Possible spider 2. ONSET: "When did you get bitten?"      Yesterday evening 3. LOCATION: "Where is the insect bite located?"      R hand  4. REDNESS: "Is the area red or pink?" If Yes, ask: "What size is area of redness?" (inches or cm). "When did the redness start?"     swollen red streaks 5. PAIN: "Is there any pain?" If Yes, ask: "How bad is it?"  (Scale 1-10; or mild, moderate, severe)     burning 6. ITCHING: "Does it itch?" If Yes, ask: "How bad is the itch?"    - MILD: doesn't interfere with normal activities   - MODERATE-SEVERE: interferes with work, school, sleep, or other activities      Itching-severe 7. SWELLING: "How big is the swelling?" (inches, cm, or compare to coins)     medium 8. OTHER SYMPTOMS: "Do you have any other symptoms?"  (e.g., difficulty breathing, hives)     no 9. PREGNANCY: "Is there any chance you are pregnant?" "When was your last menstrual period?"     *No Answer*  Protocols used: Insect Bite-A-AH

## 2022-10-30 NOTE — Telephone Encounter (Signed)
  Chief Complaint: insect bite Symptoms: R hand- swelling , red streaks Frequency: started yesterday evening Pertinent Negatives: Patient denies trouble breathing Disposition: [] ED /[x] Urgent Care (no appt availability in office) / [] Appointment(In office/virtual)/ []  [facility]/ [] Home Care/ [] Refused Recommended Disposition /[] [place] [facility]/ []  Follow-up with PCP Additional Notes:

## 2022-10-30 NOTE — ED Triage Notes (Signed)
Pt c/o right hand swelling and redness x3days  Pt states that she was reaching into a hay bale to feed her animals when she felt a bite between her 1st and 2nd finger.  Pt is using Triamcinolone cream for the swelling and redness.  Pt states that she was on an antibiotic and is now having vaginal burning and believes she has a yeast infection.

## 2022-11-10 ENCOUNTER — Telehealth: Payer: Self-pay | Admitting: Pulmonary Disease

## 2022-11-10 NOTE — Telephone Encounter (Signed)
Did she feel any relief with the prednisone?  I do not think that the medications are causing the increased shortness of breath.  She has an upcoming appointment in 2 days.  We can better assess her then.  If she feels she needs to be seen sooner she will need to go to the emergency room.

## 2022-11-10 NOTE — Telephone Encounter (Signed)
Called and spoke to patient.  She stated that she is using Breztri BID and albuterol QID with only mild relief in sx.  C/o SOB with exertion and mild dry cough that developed yesterday Seen at [facility]/30/2022 for bug bite and the week prior she had a stomach bug. On 11/24/203 she was prescribed abx and on 10/19/2022 she was prescribed prednisone for URI.  She is questioning if these medications could be causing her SOB or if Dr. Jayme Cloud has additional recommendations.  She occ wears 4L QHS and PRN during the day. She does not monitor oxygen levels. Covid and neg 10/19/2022.   Dr. Jayme Cloud, please advise. Thanks

## 2022-11-10 NOTE — Telephone Encounter (Signed)
Spoke to patient. She stated that she had mild improvement with prednisone.  She is aware of below message/recommendations and voiced her understanding.  She will keep scheduled visit. Nothing further needed.

## 2022-11-12 ENCOUNTER — Encounter: Payer: Self-pay | Admitting: Pulmonary Disease

## 2022-11-12 ENCOUNTER — Ambulatory Visit (INDEPENDENT_AMBULATORY_CARE_PROVIDER_SITE_OTHER): Payer: No Typology Code available for payment source | Admitting: Pulmonary Disease

## 2022-11-12 VITALS — BP 124/80 | HR 95 | Temp 98.0°F | Ht 64.0 in | Wt 124.8 lb

## 2022-11-12 DIAGNOSIS — J4489 Other specified chronic obstructive pulmonary disease: Secondary | ICD-10-CM

## 2022-11-12 DIAGNOSIS — G4736 Sleep related hypoventilation in conditions classified elsewhere: Secondary | ICD-10-CM | POA: Diagnosis not present

## 2022-11-12 DIAGNOSIS — R0602 Shortness of breath: Secondary | ICD-10-CM | POA: Diagnosis not present

## 2022-11-12 DIAGNOSIS — J439 Emphysema, unspecified: Secondary | ICD-10-CM | POA: Diagnosis not present

## 2022-11-12 MED ORDER — ROFLUMILAST 500 MCG PO TABS: ORAL_TABLET | ORAL | 3 refills | Status: DC

## 2022-11-12 NOTE — Patient Instructions (Addendum)
She has please use your oxygen at nighttime consistently.  We are ordering a new set of breathing tests to make sure that your lung function has not declined.  We are putting you on a tablet called Daliresp this is 1 tablet every other day for the first week then 1 tablet daily.  Over the counter you can use Mucinex to help loosen the mucus and help you cough it up easily.  I suspect you had a viral infection early on and sometimes it may take weeks to feel back to baseline.  We will see her in follow-up in 4 to 6 weeks time call sooner should any new problems arise.

## 2022-11-12 NOTE — Progress Notes (Signed)
Subjective:    Patient ID: Brandi [name], female    DOB: June 20, 1956, 66 y.o.   MRN: [id number] Patient Care Team: [name], DO as PCP - General (Family Medicine) [name], MD as Consulting Physician (Pulmonary Disease)  Chief Complaint  Patient presents with   Follow-up    SOB constant. Hard to breathe after she eats and gets full. Going on for 3 weeks.     HPI Brandi [name] is a 66 year old former smoker (50 TY) who presents for follow-up on the issue of severe COPD and dyspnea.  We last saw her on 22 Apr 2022.  This is a scheduled follow-up visit.  At her prior visit it was noted that she had not followed through with the referral to pulmonary rehab, she had been consistent in using Breztri 2 puffs twice a day and noted that her shortness of breath on exertion was not severe.  Today she presents stating that for the last 3 weeks she has been having increasing shortness of breath particularly when pushing things, lifting or walking particularly after she eats a meal.  She has not had any nausea or vomiting.  No anorexia.  No overt reflux symptoms.  She does not endorse any chest pain, fevers, chills or sweats.  She has had a cough but no sputum production.  Notes that her sputum is thick and difficult to bring up.  She notes that 3 weeks ago she may have had an upper respiratory illness which was self-limiting. She has issues with nocturnal hypoxemia due to emphysema however she has not been using her oxygen at nighttime consistently.  She does not endorse any other symptomatology.  She has found minimal relief from albuterol as needed.   DATA 04/09/2021 LDCT: Moderate to severe paraseptal and centrilobular emphysema.  Left upper lobe nodule measuring 2 mm (decreased from prior), compression fractures T7 and T9 noted previously and unchanged.  Coronary artery calcifications. 07/04/2021 alpha-1 antitrypsin: Phenotype MM, level 140 mg/dL (normal) 40/98/1191 overnight  oximetry: Baseline O2 sat 90% during sleep with desaturations as low as 83% during the night desaturation events lasted for over an hour. 08/14/2021 PFTs: FEV1 1.13 L or 46% predicted, FVC 2.42 L or 76% predicted, FEV1/FVC 47%, there is a mild response to bronchodilator with a 10% net change.  There is hyperinflation with TLC at 128% and air trapping with RV at 188%.  Diffusion capacity is moderately to severely reduced by Kco.  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 08/14/2021 cardiogram: LVEF 60 to 65%, grade 1 DD 12/04/2021 chest x-ray: Emphysema with no evidence of acute cardiopulmonary disease 12/24/2021 overnight oximetry: Baseline O2 sat 90% desaturations as low as 83%.   12/26/2021 nuclear medicine stress test: No significant ischemia, normal wall motion, EF estimated at 77%, coronary calcification in the LAD, mild aortic atherosclerosis  Review of Systems A 10 point review of systems was performed and it is as noted above otherwise negative.  Patient Active Problem List   Diagnosis Date Noted   Pre-diabetes 04/08/2021   Vitamin D deficiency 04/08/2021   COVID-19 vaccination declined 12/04/2020   Centrilobular emphysema (HCC) 04/08/2020   Nodule of upper lobe of left lung 04/08/2020   Aortic atherosclerosis (HCC) 05/21/2019   Coronary artery calcification seen on CT scan 05/21/2019   Former smoker 05/21/2019   Hyperlipidemia 05/21/2019   Social History   Tobacco Use   Smoking status: Former    Packs/day: 0.75    Years: 41.00  Total pack years: 30.75    Types: Cigarettes    Quit date: 12/08/2015    Years since quitting: 6.9   Smokeless tobacco: Never  Substance Use Topics   Alcohol use: No   No Known Allergies  Current Meds  Medication Sig   albuterol (PROVENTIL) (2.5 MG/3ML) 0.083% nebulizer solution Take 3 mLs (2.5 mg total) by nebulization every 6 (six) hours as needed for wheezing or shortness of breath.   albuterol (VENTOLIN HFA) 108 (90 Base)  MCG/ACT inhaler INHALE 2 PUFFS INTO THE LUNGS EVERY 4 HOURS AS NEEDED FOR WHEEZING OR SHORTNESS OF BREATH   alendronate (FOSAMAX) 70 MG tablet TAKE ONE TABLET BY MOUTH EACH WEEK, ON AN EMPTY STOMACH BEFORE BREAKFAST WITH 8oz OF WATER AND REMAIN UPRIGHT FOR :30   alum & mag hydroxide-simeth (MAALOX/MYLANTA) 200-200-20 MG/5ML suspension Take 30 mLs by mouth every 4 (four) hours as needed for indigestion or heartburn.   aspirin EC 81 MG tablet Take 1 tablet (81 mg total) by mouth daily.   BREZTRI AEROSPHERE 160-9-4.8 MCG/ACT AERO USE 2 INHALATION MY MOIUTH TWICE A DAY RINSE MOUTH AFTER EACH USE   busPIRone (BUSPAR) 5 MG tablet TAKE 1 TABLET BY MOUTH TWICE DAILY AS NEEDED FOR ANXIETY   Calcium Carbonate-Vit D-Min (CALCIUM 1200) 1200-1000 MG-UNIT CHEW Chew 1,200 mg by mouth daily.   Cholecalciferol (VITAMIN D3) 125 MCG (5000 UT) TABS Take by mouth. Vitamin D3 5,000 IU daily for 12 weeks then reduce to OTC Vitamin D3 2,000 IU daily for maintenance   ezetimibe (ZETIA) 10 MG tablet Take 1 tablet (10 mg total) by mouth daily.   famotidine (PEPCID) 20 MG tablet Take 1 tablet (20 mg total) by mouth 2 (two) times daily. (Patient taking differently: Take 20 mg by mouth 2 (two) times daily as needed.)   feeding supplement (ENSURE ENLIVE / ENSURE PLUS) LIQD Take 237 mLs by mouth 2 (two) times daily between meals.   mometasone (ELOCON) 0.1 % lotion Apply topically.   Multiple Vitamin (MULTIVITAMIN WITH MINERALS) TABS tablet Take 1 tablet by mouth daily.   omeprazole (PRILOSEC) 20 MG capsule Take 1 capsule (20 mg total) by mouth 2 (two) times daily before a meal.   ondansetron (ZOFRAN-ODT) 4 MG disintegrating tablet Take 1 tablet (4 mg total) by mouth every 8 (eight) hours as needed for nausea or vomiting.   OXYGEN Inhale 3 L into the lungs at bedtime as needed.   roflumilast (DALIRESP) 500 MCG TABS tablet Start 1 tablet every other day for 1 week then 1 tablet daily.   rosuvastatin (CRESTOR) 10 MG tablet Take 1  tablet (10 mg total) by mouth daily.   sodium chloride (OCEAN) 0.65 % SOLN nasal spray Place 1 spray into both nostrils as needed for congestion.   Specialty Vitamins Products (HAIR NOURISHING SUPPLEMENT PO) Take 1 tablet by mouth daily.   Immunization History  Administered Date(s) Administered   [employer] Vaccination 05/26/2021, 06/24/2021       Objective:   Physical Exam BP 124/80 (BP Location: Left Arm, Cuff Size: Normal)   Pulse 95   Temp 98 F (36.7 C)   Ht 5\' 4"  (1.626 m)   Wt 124 lb 12.8 oz (56.6 kg)   SpO2 96%   BMI 21.42 kg/m   SpO2: 96 % O2 Device: None (Room air)  GENERAL: Well-developed, well-nourished woman, no acute distress.  No conversational dyspnea. HEAD: Normocephalic, atraumatic.  EYES: Pupils equal, round, reactive to light.  No scleral icterus.  MOUTH: Poor dentition.  She does have visible nasal septal deviation. NECK: Supple. No thyromegaly. Trachea midline. No JVD.  No adenopathy. PULMONARY: Distant breath sounds bilaterally.  Coarse, otherwise no adventitious sounds. CARDIOVASCULAR: S1 and S2. Regular rate and rhythm.  No rubs, murmurs or gallops heard. ABDOMEN: Benign. MUSCULOSKELETAL: No joint deformity, no clubbing, no edema.  NEUROLOGIC: No focal deficit, no gait disturbance, speech is fluent. SKIN: Intact,warm,dry. PSYCH: Mood and behavior normal.   Ambulatory imaging was performed today: At rest on room air oxygen saturation was 96%, the patient ambulated at a moderate pace, completed 3 laps, O2 nadir 96%, moderate to severe shortness of breath.  Resting heart rate was 96 bpm at maximum for this exercise 105 bpm.  Note that oxygen saturations doing exercise actually INCREASED during ambulation maintaining between 98 to 96%.    Assessment & Plan:     ICD-10-CM   1. COPD with chronic bronchitis and emphysema (HCC)  J44.89 Pulmonary Function Test [facility] Only   J43.9 CANCELED: Alpha-1 antitrypsin phenotype   Will initiate  Daliresp Reassess with PFTs Continue Breztri and as needed albuterol    2. Nocturnal hypoxemia due to emphysema [facility])  J43.9    G47.36    Patient with variable compliance with oxygen therapy Advised to be compliant Oxygen at 2 L/min with sleep    3. SOB (shortness of breath)  R06.02    Likely due to poorly compensated COPD Other possibilities: deconditioning Has had reassuring cardiac evaluation Plan as above     Orders Placed This Encounter  Procedures   Pulmonary Function Test [facility] Only    Standing Status:   Future    Number of Occurrences:   1    Standing Expiration Date:   11/13/2023    Order Specific Question:   Full PFT: includes the following: basic spirometry, spirometry pre & post bronchodilator, diffusion capacity (DLCO), lung volumes    Answer:   Full PFT    Order Specific Question:   This test can only be performed at    Answer:   [facility]   Meds ordered this encounter  Medications   roflumilast (DALIRESP) 500 MCG TABS tablet    Sig: Start 1 tablet every other day for 1 week then 1 tablet daily.    Dispense:  30 tablet    Refill:  3   Will see the patient in follow-up in 4 to 6 weeks time call sooner should any problems arise.  Gailen Shelter, MD Advanced Bronchoscopy PCCM [facility]    *This note was dictated using voice recognition software/Dragon.  Despite best efforts to proofread, errors can occur which can change the meaning. Any transcriptional errors that result from this process are unintentional and may not be fully corrected at the time of dictation.

## 2022-11-18 DIAGNOSIS — I700 Atherosclerosis of aorta: Secondary | ICD-10-CM | POA: Diagnosis not present

## 2022-11-18 DIAGNOSIS — M810 Age-related osteoporosis without current pathological fracture: Secondary | ICD-10-CM | POA: Diagnosis not present

## 2022-11-18 DIAGNOSIS — K219 Gastro-esophageal reflux disease without esophagitis: Secondary | ICD-10-CM | POA: Diagnosis not present

## 2022-11-18 DIAGNOSIS — E785 Hyperlipidemia, unspecified: Secondary | ICD-10-CM | POA: Diagnosis not present

## 2022-11-18 DIAGNOSIS — F17211 Nicotine dependence, cigarettes, in remission: Secondary | ICD-10-CM | POA: Diagnosis not present

## 2022-11-18 DIAGNOSIS — Z1211 Encounter for screening for malignant neoplasm of colon: Secondary | ICD-10-CM | POA: Diagnosis not present

## 2022-11-18 DIAGNOSIS — N182 Chronic kidney disease, stage 2 (mild): Secondary | ICD-10-CM | POA: Diagnosis not present

## 2022-11-18 DIAGNOSIS — J439 Emphysema, unspecified: Secondary | ICD-10-CM | POA: Diagnosis not present

## 2022-11-18 DIAGNOSIS — Z6820 Body mass index (BMI) 20.0-20.9, adult: Secondary | ICD-10-CM | POA: Diagnosis not present

## 2022-11-18 DIAGNOSIS — Z008 Encounter for other general examination: Secondary | ICD-10-CM | POA: Diagnosis not present

## 2022-11-23 ENCOUNTER — Telehealth: Payer: Self-pay | Admitting: Pulmonary Disease

## 2022-11-23 NOTE — Telephone Encounter (Signed)
Lets stop Daliresp. This should subside in a day or two.

## 2022-11-23 NOTE — Telephone Encounter (Signed)
I notified the patient. Nothing further needed. 

## 2022-11-23 NOTE — Telephone Encounter (Signed)
I spoke with the patient. She said when she started taking the Daliresp she started out doing it every other day. That is when the symptoms started. Last Thursday or Friday started taking it everyday and thee symptoms have got worse. What should she do?

## 2022-11-25 ENCOUNTER — Other Ambulatory Visit: Payer: Self-pay | Admitting: Family Medicine

## 2022-11-25 ENCOUNTER — Telehealth: Payer: Self-pay

## 2022-11-25 DIAGNOSIS — J432 Centrilobular emphysema: Secondary | ICD-10-CM

## 2022-11-25 DIAGNOSIS — F4323 Adjustment disorder with mixed anxiety and depressed mood: Secondary | ICD-10-CM

## 2022-11-25 NOTE — Telephone Encounter (Signed)
Breztri Rx and application has been faxed to [employer]. Received successful fax confirmation.

## 2022-11-26 NOTE — Telephone Encounter (Signed)
Requested Prescriptions  Pending Prescriptions Disp Refills   busPIRone (BUSPAR) 5 MG tablet [Pharmacy Med Name: BUSPIRONE HCL 5 MG TAB] 60 tablet 2    Sig: TAKE 1 TABLET BY MOUTH TWICE DAILY AS NEEDED FOR ANXIETY     Psychiatry: Anxiolytics/Hypnotics - Non-controlled Passed - 11/25/2022  5:36 PM      Passed - Valid encounter within last 12 months    Recent Outpatient Visits           1 month ago Viral URI   [facility] [place], Salvadore Oxford, NP   8 months ago Viral gastroenteritis   [facility] [name], DO   9 months ago COPD with acute exacerbation [facility])   [facility] [name], DO   11 months ago Dyspnea on exertion   [facility] [place], [place], NP   1 year ago Centrilobular emphysema [facility])   [facility], [name], DO               albuterol (VENTOLIN HFA) 108 (90 Base) MCG/ACT inhaler [Pharmacy Med Name: ALBUTEROL SULFATE HFA 108 (90 BASE)] 8.5 g 2    Sig: INHALE 2 PUFFS INTO THE LUNGS EVERY 4 HOURS AS NEEDED FOR WHEEZING OR SHORTNESS OF BREATH     Pulmonology:  Beta Agonists 2 Passed - 11/25/2022  5:36 PM      Passed - Last BP in normal range    BP Readings from Last 1 Encounters:  11/12/22 124/80         Passed - Last Heart Rate in normal range    Pulse Readings from Last 1 Encounters:  11/12/22 95         Passed - Valid encounter within last 12 months    Recent Outpatient Visits           1 month ago Viral URI   [facility] [place], Salvadore Oxford, NP   8 months ago Viral gastroenteritis   [facility] [place], [name], DO   9 months ago COPD with acute exacerbation [facility])   [facility] [name], DO   11 months ago Dyspnea on exertion   [facility] [place], Salvadore Oxford, NP   1 year ago Centrilobular emphysema [facility])   [facility] [place],  [name], DO

## 2022-12-23 DIAGNOSIS — J449 Chronic obstructive pulmonary disease, unspecified: Secondary | ICD-10-CM | POA: Diagnosis not present

## 2023-01-05 ENCOUNTER — Ambulatory Visit
Admission: RE | Admit: 2023-01-05 | Discharge: 2023-01-05 | Disposition: A | Discharge status: Home / Self Care | Payer: No Typology Code available for payment source | Source: Ambulatory Visit | Attending: Pulmonary Disease | Admitting: Pulmonary Disease

## 2023-01-05 ENCOUNTER — Encounter: Payer: Self-pay | Admitting: Pulmonary Disease

## 2023-01-05 ENCOUNTER — Ambulatory Visit (INDEPENDENT_AMBULATORY_CARE_PROVIDER_SITE_OTHER): Payer: No Typology Code available for payment source | Admitting: Pulmonary Disease

## 2023-01-05 ENCOUNTER — Other Ambulatory Visit: Payer: Self-pay | Admitting: Family Medicine

## 2023-01-05 VITALS — BP 110/68 | HR 95 | Temp 97.4°F | Ht 64.0 in | Wt 124.0 lb

## 2023-01-05 DIAGNOSIS — J449 Chronic obstructive pulmonary disease, unspecified: Secondary | ICD-10-CM | POA: Diagnosis not present

## 2023-01-05 DIAGNOSIS — R079 Chest pain, unspecified: Secondary | ICD-10-CM | POA: Diagnosis not present

## 2023-01-05 DIAGNOSIS — G4736 Sleep related hypoventilation in conditions classified elsewhere: Secondary | ICD-10-CM

## 2023-01-05 DIAGNOSIS — Z87891 Personal history of nicotine dependence: Secondary | ICD-10-CM

## 2023-01-05 DIAGNOSIS — R0602 Shortness of breath: Secondary | ICD-10-CM

## 2023-01-05 DIAGNOSIS — J439 Emphysema, unspecified: Secondary | ICD-10-CM | POA: Diagnosis not present

## 2023-01-05 DIAGNOSIS — J432 Centrilobular emphysema: Secondary | ICD-10-CM

## 2023-01-05 DIAGNOSIS — J4489 Other specified chronic obstructive pulmonary disease: Secondary | ICD-10-CM | POA: Diagnosis not present

## 2023-01-05 MED ORDER — ALBUTEROL SULFATE (2.5 MG/3ML) 0.083% IN NEBU
2.5000 mg | INHALATION_SOLUTION | Freq: Once | RESPIRATORY_TRACT | Status: AC
  Administered 2023-01-05: 2.5 mg via RESPIRATORY_TRACT

## 2023-01-05 MED ORDER — METHYLPREDNISOLONE 4 MG PO TBPK: ORAL_TABLET | ORAL | 0 refills | Status: DC

## 2023-01-05 MED ORDER — THEOPHYLLINE ER 200 MG PO TB12: 200.0000 mg | ORAL_TABLET | Freq: Two times a day (BID) | ORAL | 1 refills | Status: DC

## 2023-01-05 NOTE — Patient Instructions (Addendum)
We are going to get a chest x-ray to check on your pain that you are having on the right side of your chest.  We have sent in some prescriptions to your pharmacy.  Do reconsider going to pulmonary rehab I think that this would help you.  We will need to reschedule your breathing test.  This is important so we can see if your lung function has deteriorated any further.  Continue using your [place] today.  While you are having issues with the shortness of breath you can use your albuterol solution in the nebulizer up to 3 times a day to see if this helps.  Use this instead of the albuterol inhaler for a few days to see how you do.  Use the flutter valve after your albuterol lies or so that it can help you get rid of congestion.  We will see you in follow-up in 4 to 6 weeks time call sooner should any new problems arise

## 2023-01-05 NOTE — Progress Notes (Signed)
Subjective:    Patient ID: Brandi [name], female    DOB: 1956-10-05, 67 y.o.   MRN: [id number]  Patient Care Team: Olin Hauser, DO as PCP - General (Family Medicine) Tyler Pita, MD as Consulting Physician (Pulmonary Disease)  Chief Complaint  Patient presents with   Follow-up    Sick since Nov. SOB. Some wheezing. Dry cough.    HPI Patient is a 67 year old former smoker (69 PY) who presents for follow-up on the issue of severe COPD. Most recent visit with me was on 12 November 2022.  This is a scheduled visit.  Recall that the patient had COVID-19 in January 2022 she was admitted to [facility] between 16 December 2020 through 23 December 2020.  She had acute respiratory failure with hypoxia and was discharged home on 2 L/min nasal cannula O2 24/7.  She was subsequently followed by [facility] and was weaned off of O2 during the day.  She however remained on oxygen nocturnally.  After her initial visit here she had an overnight oximetry on room air that confirmed that she needs to continue using oxygen nocturnally.  Has been compliant with this therapy.  She was requalified again in January 2023 with an overnight oximetry on room air and continues to show significant desaturations during sleep.  He has significant emphysema and COPD, stage III.  She has been maintained on Breztri 2 puffs twice a day, and as needed albuterol.  At her previous visit on 7 December she noted that she had had a viral infection and bronchitis around the Thanksgiving holidays.  She had received prednisone and antibiotic therapy.  And was complaining that she was no better.  Her exam was essentially unchanged at that time however she did complain of tenacious secretions.  She continues to have issues with tenacious secretions.  Previously we had given her an Acapella flutter valve but she does not use this reliably.  She also has nebulizer with albuterol solution as needed and is not using it currently.   In addition she was referred back in February 2023 to pulmonary rehab but never enrolled due to cost.  Today she presents being very short of breath had to come in and transport chair because of her dyspnea.  Main complaint continues to be the inability to expectorate secretions well.  During her last visit she was given a trial of Daliresp however she could not tolerate this medication.  She has been compliant with nocturnal oxygen.  She has not had any fevers, chills or sweats.  When she expectorates the sputum is white to gray.  She has not had any orthopnea or paroxysmal nocturnal dyspnea no lower extremity edema nor calf tenderness.  She does complain of some lower rib pain on the right.  At her prior visit we ordered PFTs to evaluate if she had any lung function deterioration however she has not scheduled these due to not feeling well.  DATA 04/09/2021 LDCT: Moderate to severe paraseptal and centrilobular emphysema.  Left upper lobe nodule measuring 2 mm (decreased from prior), compression fractures T7 and T9 noted previously and unchanged.  Coronary artery calcifications. 07/04/2021 alpha-1 antitrypsin: Phenotype MM, level 140 mg/dL (normal) 07/09/2021 overnight oximetry: Baseline O2 sat 90% during sleep with desaturations as low as 83% during the night desaturation events lasted for over an hour. 08/14/2021 PFTs: FEV1 1.13 L or 46% predicted, FVC 2.42 L or 76% predicted, FEV1/FVC 47%, there is a mild response to bronchodilator with a 10% net  change.  There is hyperinflation with TLC at 128% and air trapping with RV at 188%.  Diffusion capacity is moderately to severely reduced by Kco.  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 08/14/2021 cardiogram: LVEF 60 to 65%, grade 1 DD 12/04/2021 chest x-ray: Emphysema with no evidence of acute cardiopulmonary disease 12/24/2021 overnight oximetry: Baseline O2 sat 90% desaturations as low as 83%.   12/26/2021 nuclear medicine stress test: No  significant ischemia, normal wall motion, EF estimated at 77%, coronary calcification in the LAD, mild aortic atherosclerosis  Review of Systems A 10 point review of systems was performed and it is as noted above otherwise negative.  Patient Active Problem List   Diagnosis Date Noted   Pre-diabetes 04/08/2021   Vitamin D deficiency 04/08/2021   COVID-19 vaccination declined 12/04/2020   Centrilobular emphysema ([place]) 04/08/2020   Nodule of upper lobe of left lung 04/08/2020   Aortic atherosclerosis ([place]) 05/21/2019   Coronary artery calcification seen on CT scan 05/21/2019   Former smoker 05/21/2019   Hyperlipidemia 05/21/2019   Social History   Tobacco Use   Smoking status: Former    Packs/day: 0.75    Years: 41.00    Total pack years: 30.75    Types: Cigarettes    Quit date: 12/08/2015    Years since quitting: 7.0   Smokeless tobacco: Never  Substance Use Topics   Alcohol use: No   No Known Allergies  Current Meds  Medication Sig   albuterol (PROVENTIL) (2.5 MG/3ML) 0.083% nebulizer solution Take 3 mLs (2.5 mg total) by nebulization every 6 (six) hours as needed for wheezing or shortness of breath.   albuterol (VENTOLIN HFA) 108 (90 Base) MCG/ACT inhaler INHALE 2 PUFFS INTO THE LUNGS EVERY 4 HOURS AS NEEDED FOR WHEEZING OR SHORTNESS OF BREATH   alendronate (FOSAMAX) 70 MG tablet TAKE ONE TABLET BY MOUTH EACH WEEK, ON AN EMPTY STOMACH BEFORE BREAKFAST WITH 8oz OF WATER AND REMAIN UPRIGHT FOR :30   alum & mag hydroxide-simeth (MAALOX/MYLANTA) 200-200-20 MG/5ML suspension Take 30 mLs by mouth every 4 (four) hours as needed for indigestion or heartburn.   aspirin EC 81 MG tablet Take 1 tablet (81 mg total) by mouth daily.   BREZTRI AEROSPHERE 160-9-4.8 MCG/ACT AERO USE 2 INHALATION MY MOIUTH TWICE A DAY RINSE MOUTH AFTER EACH USE   busPIRone (BUSPAR) 5 MG tablet TAKE 1 TABLET BY MOUTH TWICE DAILY AS NEEDED FOR ANXIETY   Calcium Carbonate-Vit D-Min (CALCIUM 1200) 1200-1000 MG-UNIT  CHEW Chew 1,200 mg by mouth daily.   Cholecalciferol (VITAMIN D3) 125 MCG (5000 UT) TABS Take by mouth. Vitamin D3 5,000 IU daily for 12 weeks then reduce to OTC Vitamin D3 2,000 IU daily for maintenance   ezetimibe (ZETIA) 10 MG tablet Take 1 tablet (10 mg total) by mouth daily.   famotidine (PEPCID) 20 MG tablet Take 1 tablet (20 mg total) by mouth 2 (two) times daily. (Patient taking differently: Take 20 mg by mouth 2 (two) times daily as needed.)   feeding supplement (ENSURE ENLIVE / ENSURE PLUS) LIQD Take 237 mLs by mouth 2 (two) times daily between meals.   mometasone (ELOCON) 0.1 % lotion Apply topically.   Multiple Vitamin (MULTIVITAMIN WITH MINERALS) TABS tablet Take 1 tablet by mouth daily.   omeprazole (PRILOSEC) 20 MG capsule Take 1 capsule (20 mg total) by mouth 2 (two) times daily before a meal.   ondansetron (ZOFRAN-ODT) 4 MG disintegrating tablet Take 1 tablet (4 mg total) by mouth every  8 (eight) hours as needed for nausea or vomiting.   OXYGEN Inhale 3 L into the lungs at bedtime as needed.   rosuvastatin (CRESTOR) 10 MG tablet Take 1 tablet (10 mg total) by mouth daily.   sodium chloride (OCEAN) 0.65 % SOLN nasal spray Place 1 spray into both nostrils as needed for congestion.   Specialty Vitamins Products (HAIR NOURISHING SUPPLEMENT PO) Take 1 tablet by mouth daily.   Immunization History  Administered Date(s) Administered   [employer] Vaccination 05/26/2021, 06/24/2021       Objective:   Physical Exam BP 110/68 (BP Location: Left Arm, Cuff Size: Normal)   Pulse 95   Temp (!) 97.4 F (36.3 C)   Ht 5\' 4"  (1.626 m)   Wt 124 lb (56.2 kg)   SpO2 95%   BMI 21.28 kg/m    GENERAL: Well-developed, well-nourished woman, no acute distress.  Presents in transport chair due to dyspnea.  Uncomfortable but no tachypnea, no conversational dyspnea.   HEAD: Normocephalic, atraumatic.  EYES: Pupils equal, round, reactive to light.  No scleral icterus.  MOUTH: Poor  dentition.  She does have visible nasal septal deviation. NECK: Supple. No thyromegaly. Trachea midline. No JVD.  No adenopathy. PULMONARY: Distant breath sounds, poor air movement.  Coarse, otherwise no adventitious sounds. CARDIOVASCULAR: S1 and S2. Regular rate and rhythm.  No rubs, murmurs or gallops heard. ABDOMEN: Benign. MUSCULOSKELETAL: No joint deformity, no clubbing, no edema.  NEUROLOGIC: No focal deficit, no gait disturbance, speech is fluent. SKIN: Intact,warm,dry. PSYCH: Mood and behavior normal.   Ambulatory oximetry was performed today: At rest heart rate was 96 bpm, oxygen saturation 96%, heart rate maximum 105 bpm, oxygen saturation INCREASED during exercise 97% at end of exercise.  Patient ambulated 750 feet.  Patient received albuterol 2.5 mg via nebulizer, auscultation of the lungs afterwards showed improvement in air entry, no wheezes.    Assessment & Plan:     ICD-10-CM   1. Stage 3 severe COPD by GOLD classification ([place])  J44.9 albuterol (PROVENTIL) (2.5 MG/3ML) 0.083% nebulizer solution 2.5 mg    DG Chest 2 View   Not well compensated Continue Breztri 2 puffs twice a day Recommend use of albuterol nebulizer solution at least 3 times a day Use Acapella after albuterol    2. Nocturnal hypoxemia due to emphysema (HCC)  J43.9    G47.36    Continue oxygen nocturnally Patient compliant    3. Shortness of breath  R06.02 DG Chest 2 View   Poorly compensated COPD Prolonged exacerbation Rechallenge with Medrol Dosepak Nebulizers as above No need for antibiotics    4. Former smoker  Z87.891    No evidence of relapse     Orders Placed This Encounter  Procedures   DG Chest 2 View    Standing Status:   Future    Number of Occurrences:   1    Standing Expiration Date:   01/06/2024    Order Specific Question:   Reason for Exam (SYMPTOM  OR DIAGNOSIS REQUIRED)    Answer:   right side chest pain    Order Specific Question:   Preferred imaging location?     Answer:   [facility] ordered this encounter  Medications   theophylline (THEODUR) 200 MG 12 hr tablet    Sig: Take 1 tablet (200 mg total) by mouth 2 (two) times daily.    Dispense:  60 tablet    Refill:  1   albuterol (PROVENTIL) (2.5  MG/3ML) 0.083% nebulizer solution 2.5 mg   methylPREDNISolone (MEDROL DOSEPAK) 4 MG TBPK tablet    Sig: Take as directed in the package    Dispense:  21 tablet    Refill:  0   Patient's options are very limited.  She did not tolerate Daliresp.  We will give her a trial of theophylline to see if this helps move secretions.  She also was advised to use her nebulizer treatments 3 times a day and follow-up with Acapella.  We will check a chest x-ray today due to her complaint of rib pain.  Nothing salient on exam.  We will see her in follow-up in 4 to 6 weeks time she is to contact us prior to that time should any new difficulties arise.  Total time of visit 43 minutes.  Gailen Shelter, MD Advanced Bronchoscopy PCCM [facility]    *This note was dictated using voice recognition software/Dragon.  Despite best efforts to proofread, errors can occur which can change the meaning. Any transcriptional errors that result from this process are unintentional and may not be fully corrected at the time of dictation.

## 2023-01-06 ENCOUNTER — Telehealth: Payer: Self-pay | Admitting: Pulmonary Disease

## 2023-01-06 MED ORDER — AMOXICILLIN-POT CLAVULANATE 875-125 MG PO TABS: 1.0000 | ORAL_TABLET | Freq: Two times a day (BID) | ORAL | 0 refills | Status: DC

## 2023-01-06 MED ORDER — AZITHROMYCIN 250 MG PO TABS: ORAL_TABLET | ORAL | 0 refills | Status: AC

## 2023-01-06 NOTE — Telephone Encounter (Signed)
Brandi Pita, MD  Brandi [name], CMA Cc: Brandi [name], CMA Chest x-ray reviewed.  She does have a pneumonia on the right lung.  Is she having any issues with reflux?  We are going to send in antibiotics Augmentin 875 1 tablet twice a day with meals and an Azithromycin Z-Pak.  Patient is aware of results and voiced her understanding. Rx sent to preferred pharmacy.  She would like a Rx of diflucan since she will be taking two abx. She has a hx of yeast infections.  Dr. Patsey Berthold, please advise. Thanks

## 2023-01-06 NOTE — Telephone Encounter (Signed)
Spoke to patient. She is requesting CXR results.   Dr. Patsey Berthold, please advise.

## 2023-01-06 NOTE — Telephone Encounter (Signed)
If she develops a yeast infection it depends where.  If its vaginal she can use Monistat over-the-counter.

## 2023-01-06 NOTE — Telephone Encounter (Signed)
Requested medication (s) are due for refill today:   Yes  Requested medication (s) are on the active medication list:   Yes  Future visit scheduled:   No   Last ordered: 08/04/2022 10.7 G, 3 refills  Returned because no protocol assigned to this medication  Requested Prescriptions  Pending Prescriptions Disp Refills   BREZTRI AEROSPHERE 160-9-4.8 MCG/ACT AERO [Pharmacy Med Name: BREZTRI AEROSPHERE 160-9-4.8 MCG/AC] 10.7 g 3    Sig: USE 2 INHALATION MY MOIUTH TWICE A DAY RINSE MOUTH AFTER EACH USE     Off-Protocol Failed - 01/05/2023  1:24 PM      Failed - Medication not assigned to a protocol, review manually.      Passed - Valid encounter within last 12 months    Recent Outpatient Visits           2 months ago Viral URI   [facility] [place], Coralie Keens, [place]   10 months ago Viral gastroenteritis   [facility] Olin Hauser, DO   10 months ago COPD with acute exacerbation [facility])   [place], [place]   1 year ago Dyspnea on exertion   [facility] [place], Coralie Keens, NP   1 year ago Centrilobular emphysema [facility])   [place], [place]

## 2023-01-06 NOTE — Telephone Encounter (Signed)
Patient is aware of below message/recommendations and voiced her understanding.  °Nothing further needed.  ° °

## 2023-01-07 LAB — FECAL OCCULT BLOOD, IMMUNOCHEMICAL: IFOBT: NEGATIVE

## 2023-01-11 ENCOUNTER — Ambulatory Visit: Payer: Self-pay

## 2023-01-11 DIAGNOSIS — B379 Candidiasis, unspecified: Secondary | ICD-10-CM

## 2023-01-11 MED ORDER — FLUCONAZOLE 150 MG PO TABS: ORAL_TABLET | ORAL | 0 refills | Status: DC

## 2023-01-11 NOTE — Addendum Note (Signed)
Addended by: Olin Hauser on: 01/11/2023 07:03 PM   Modules accepted: Orders

## 2023-01-11 NOTE — Telephone Encounter (Signed)
I did not see her for this problem. Best I can do is offer the oral diflucan for the yeast symptoms, but usually I would ask that she do a visit. But there is no availability.  She should follow up with her Pulmonology for imaging. They can order X-rays at our office as the location if that is easier for her. But I would ask her to discuss repeat imaging timeline with her Pulmonology office for now.  Nobie Putnam, [facility] 01/11/2023, 7:02 PM

## 2023-01-11 NOTE — Telephone Encounter (Signed)
  Chief Complaint: Yeast infection Symptoms: Itching and burning. Frequency: yesterday Pertinent Negatives: Patient denies  Disposition: [] ED /[] Urgent Care (no appt availability in office) / [] Appointment(In office/virtual)/ []  [facility]/ [] Home Care/ [] Refused Recommended Disposition /[] [facility]/ [x]  Follow-up with PCP Additional Notes: Pt is on antibiotics for pneumonia and has s/s of a yeast infection. Pt does not want to try OTC medication . She would like something called in.   She would also like to get another x-ray done to see how pneumonia is doing. Pt is afraid that pneumonia will spread to left lung.   Please advise.    Summary: Pt stated she was on an antibiotic and she now feels like she has a yeast infection.   Pt stated she was on an antibiotic and she now feels like she has a yeast infection. Pt requests Rx for yeast infection to be sent to [place], [place] also requests that another x-ray is done. Cb# [phone]     Reason for Disposition  [1] Vaginal itching AND [2] not improved > 3 days following Care Advice  Answer Assessment - Initial Assessment Questions 1. SYMPTOM: "What's the main symptom you're concerned about?" (e.g., pain, itching, dryness)     Burning and itching discharge 2. LOCATION: "Where is the   located?" (e.g., inside/outside, left/right)     vagina 3. ONSET: "When did the    start?"     Last night 4. PAIN: "Is there any pain?" If Yes, ask: "How bad is it?" (Scale: 1-10; mild, moderate, severe)   -  MILD (1-3): Doesn't interfere with normal activities.    -  MODERATE (4-7): Interferes with normal activities (e.g., work or school) or awakens from sleep.     -  SEVERE (8-10): Excruciating pain, unable to do any normal activities.     Burning 5. ITCHING: "Is there any itching?" If Yes, ask: "How bad is it?" (Scale: 1-10; mild, moderate, severe)     moderate 6. CAUSE: "What do you think  is causing the discharge?" "Have you had the same problem before? What happened then?"     Years infection 7. OTHER SYMPTOMS: "Do you have any other symptoms?" (e.g., fever, itching, vaginal bleeding, pain with urination, injury to genital area, vaginal foreign body)      8. PREGNANCY: "Is there any chance you are pregnant?" "When was your last menstrual period?"  Protocols used: Vaginal Symptoms-A-AH

## 2023-01-12 ENCOUNTER — Telehealth: Payer: Self-pay | Admitting: Family Medicine

## 2023-01-12 NOTE — Telephone Encounter (Signed)
Okay. She may schedule a follow up apt if she needs anything further.  Our office did not see her for this problem. Hopefully she was aware that my recommendation was for her to return to the same place that treated her initially.  I normally do not order X-rays without evaluating patients.  But I did order he Diflucan to help her out.  Thanks  Nobie Putnam, [facility] 01/12/2023, 2:36 PM

## 2023-01-12 NOTE — Telephone Encounter (Signed)
Patient called in states she is ok with diflucan being sent in for the yeast infection and wants the xrays done to check her lungs. I also gave her the info about following up with her pulmonologist which she want happy about.

## 2023-01-23 DIAGNOSIS — J449 Chronic obstructive pulmonary disease, unspecified: Secondary | ICD-10-CM | POA: Diagnosis not present

## 2023-02-09 ENCOUNTER — Ambulatory Visit: Payer: No Typology Code available for payment source | Admitting: Pulmonary Disease

## 2023-02-21 DIAGNOSIS — J449 Chronic obstructive pulmonary disease, unspecified: Secondary | ICD-10-CM | POA: Diagnosis not present

## 2023-02-23 ENCOUNTER — Ambulatory Visit
Admission: RE | Admit: 2023-02-23 | Discharge: 2023-02-23 | Disposition: A | Discharge status: Home / Self Care | Payer: No Typology Code available for payment source | Source: Ambulatory Visit | Attending: Internal Medicine | Admitting: Internal Medicine

## 2023-02-23 ENCOUNTER — Ambulatory Visit (INDEPENDENT_AMBULATORY_CARE_PROVIDER_SITE_OTHER): Payer: No Typology Code available for payment source | Admitting: Internal Medicine

## 2023-02-23 ENCOUNTER — Ambulatory Visit: Payer: Self-pay

## 2023-02-23 ENCOUNTER — Ambulatory Visit
Admission: RE | Admit: 2023-02-23 | Discharge: 2023-02-23 | Disposition: A | Discharge status: Home / Self Care | Payer: No Typology Code available for payment source | Source: Home / Self Care | Attending: Internal Medicine | Admitting: Internal Medicine

## 2023-02-23 ENCOUNTER — Encounter: Payer: Self-pay | Admitting: Internal Medicine

## 2023-02-23 VITALS — BP 122/74 | HR 102 | Temp 95.9°F

## 2023-02-23 DIAGNOSIS — R0602 Shortness of breath: Secondary | ICD-10-CM | POA: Insufficient documentation

## 2023-02-23 DIAGNOSIS — J9611 Chronic respiratory failure with hypoxia: Secondary | ICD-10-CM | POA: Diagnosis not present

## 2023-02-23 DIAGNOSIS — J410 Simple chronic bronchitis: Secondary | ICD-10-CM | POA: Diagnosis not present

## 2023-02-23 DIAGNOSIS — J439 Emphysema, unspecified: Secondary | ICD-10-CM | POA: Diagnosis not present

## 2023-02-23 MED ORDER — MONTELUKAST SODIUM 10 MG PO TABS: 10.0000 mg | ORAL_TABLET | Freq: Every day | ORAL | 0 refills | Status: DC

## 2023-02-23 NOTE — Patient Instructions (Signed)
COPD and Physical Activity ?Chronic obstructive pulmonary disease (COPD) is a long-term, or chronic, condition that affects the lungs. COPD is a general term that can be used to describe many problems that cause inflammation of the lungs and limit airflow. These conditions include chronic bronchitis and emphysema. ?The main symptom of COPD is shortness of breath, which makes it harder to do even simple tasks. This can also make it harder to exercise and stay active. Talk with your health care provider about treatments to help you breathe better and actions you can take to prevent breathing problems during physical activity. ?What are the benefits of exercising when you have COPD? ?Exercising regularly is an important part of a healthy lifestyle. You can still exercise and do physical activities even though you have COPD. Exercise and physical activity improve your shortness of breath by increasing blood flow (circulation). This causes your heart to pump more oxygen through your body. Moderate exercise can: ?Improve oxygen use. ?Increase your energy level. ?Help with shortness of breath. ?Strengthen your breathing muscles. ?Improve heart health. ?Help with sleep. ?Improve your self-esteem and feelings of self-worth. ?Lower depression, stress, and anxiety. ?Exercise can benefit everyone with COPD. The severity of your disease may affect how hard you can exercise, especially at first, but everyone can benefit. Talk with your health care provider about how much exercise is safe for you, and which activities and exercises are safe for you. ?What actions can I take to prevent breathing problems during physical activity? ?Sign up for a pulmonary rehabilitation program. This type of program may include: ?Education about lung diseases. ?Exercise classes that teach you how to exercise and be more active while improving your breathing. This usually involves: ?Exercise using your lower extremities, such as a stationary  bicycle. ?About 30 minutes of exercise, 2 to 5 times per week, for 6 to 12 weeks. ?Strength training, such as push-ups or leg lifts. ?Nutrition education. ?Group classes in which you can talk with others who also have COPD and learn ways to manage stress. ?If you use an oxygen tank, you should use it while you exercise. Work with your health care provider to adjust your oxygen for your physical activity. Your resting flow rate is different from your flow rate during physical activity. ?How to manage your breathing while exercising ?While you are exercising: ?Take slow breaths. ?Pace yourself, and do nottry to go too fast. ?Purse your lips while breathing out. Pursing your lips is similar to a kissing or whistling position. ?If doing exercise that uses a quick burst of effort, such as weight lifting: ?Breathe in before starting the exercise. ?Breathe out during the hardest part of the exercise, such as raising the weights. ?Where to find support ?You can find support for exercising with COPD from: ?Your health care provider. ?A pulmonary rehabilitation program. ?Your local health department or community health programs. ?Support groups, either online or in-person. Your health care provider may be able to recommend support groups. ?Where to find more information ?You can find more information about exercising with COPD from: ?American Lung Association: lung.org ?COPD Foundation: copdfoundation.org ?Contact a health care provider if: ?Your symptoms get worse. ?You have nausea. ?You have a fever. ?You want to start a new exercise program or a new activity. ?Get help right away if: ?You have chest pain. ?You cannot breathe. ?These symptoms may represent a serious problem that is an emergency. Do not wait to see if the symptoms will go away. Get medical help right away. Call   your local emergency services (911 in the [place] Do not drive yourself to the hospital. ?Summary ?COPD is a general term that can be used to describe  many different lung problems that cause lung inflammation and limit airflow. This includes chronic bronchitis and emphysema. ?Exercise and physical activity improve your shortness of breath by increasing blood flow (circulation). This causes your heart to provide more oxygen to your body. ?Contact your health care provider before starting any exercise program or new activity. Ask your health care provider what exercises and activities are safe for you. ?This information is not intended to replace advice given to you by your health care provider. Make sure you discuss any questions you have with your health care provider. ?Document Revised: 10/01/2020 Document Reviewed: 10/01/2020 ?Elsevier Patient Education ? 2023 Elsevier Inc. ? ?

## 2023-02-23 NOTE — Telephone Encounter (Signed)
     Chief Complaint: SOB, "I think the pollen is making it worse. I use oxygen and my inhalers." Symptoms: SOB Frequency: "It started weeks ago but it's getting worse." Pertinent Negatives: Patient denies cough Disposition: [] ED /[] Urgent Care (no appt availability in office) / [x] Appointment(In office/virtual)/ []  [facility]/ [] Home Care/ [] Refused Recommended Disposition /[] [facility]/ []  Follow-up with PCP Additional Notes: FC notified pt. Is coming in early - [place].  Reason for Disposition  [1] MILD difficulty breathing (e.g., minimal/no SOB at rest, SOB with walking, pulse <100) AND [2] NEW-onset or WORSE than normal  Answer Assessment - Initial Assessment Questions 1. RESPIRATORY STATUS: "Describe your breathing?" (e.g., wheezing, shortness of breath, unable to speak, severe coughing)      SOB 2. ONSET: "When did this breathing problem begin?"      Weeks 3. PATTERN "Does the difficult breathing come and go, or has it been constant since it started?"      Constant 4. SEVERITY: "How bad is your breathing?" (e.g., mild, moderate, severe)    - MILD: No SOB at rest, mild SOB with walking, speaks normally in sentences, can lie down, no retractions, pulse < 100.    - MODERATE: SOB at rest, SOB with minimal exertion and prefers to sit, cannot lie down flat, speaks in phrases, mild retractions, audible wheezing, pulse 100-120.    - SEVERE: Very SOB at rest, speaks in single words, struggling to breathe, sitting hunched forward, retractions, pulse > 120      Moderate 5. RECURRENT SYMPTOM: "Have you had difficulty breathing before?" If Yes, ask: "When was the last time?" and "What happened that time?"      Yes 6. CARDIAC HISTORY: "Do you have any history of heart disease?" (e.g., heart attack, angina, bypass surgery, angioplasty)      No 7. LUNG HISTORY: "Do you have any history of lung disease?"  (e.g., pulmonary embolus, asthma, emphysema)     COPD 8. CAUSE:  "What do you think is causing the breathing problem?"      Unsure 9. OTHER SYMPTOMS: "Do you have any other symptoms? (e.g., dizziness, runny nose, cough, chest pain, fever)     Wheezing 10. O2 SATURATION MONITOR:  "Do you use an oxygen saturation monitor (pulse oximeter) at home?" If Yes, ask: "What is your reading (oxygen level) today?" "What is your usual oxygen saturation reading?" (e.g., 95%)       90 11. PREGNANCY: "Is there any chance you are pregnant?" "When was your last menstrual period?"       No 12. TRAVEL: "Have you traveled out of the country in the last month?" (e.g., travel history, exposures)       No  Protocols used: Breathing Difficulty-A-AH

## 2023-02-23 NOTE — Progress Notes (Signed)
Subjective:    Patient ID: Brandi [name], female    DOB: December 12, 1955, 67 y.o.   MRN: [id number]  HPI  Patient presents to clinic today with complaint of shortness of breath.  This started a few weeks ago. It only occurs with exertion, not with rest. She denies chest pain. She has had a slight cough productive of thick white mucous. She reports the mucous can be green at time. She denies headache, sinus pressure, runny nose, nasal congestion, ear pain, or sore throat. She denies fever or chills. She has a history of COPD managed on Breztri and Albuterol. She reports she uses her Albuterol multiple times per day.  She was treated for pneumonia in January 2024.  She reports she has been on multiple rounds of steroids and antibiotics since that time.  She was prescribed Amoxicillin on 3/5 but finished this 4 days ago. She wears oxygen only as needed.  She reports she recently missed her pulmonology appointment on 3/5 because she was not feeling well.  Review of Systems     Past Medical History:  Diagnosis Date   Hyperlipidemia     Current Outpatient Medications  Medication Sig Dispense Refill   albuterol (PROVENTIL) (2.5 MG/3ML) 0.083% nebulizer solution Take 3 mLs (2.5 mg total) by nebulization every 6 (six) hours as needed for wheezing or shortness of breath. 75 mL 12   albuterol (VENTOLIN HFA) 108 (90 Base) MCG/ACT inhaler INHALE 2 PUFFS INTO THE LUNGS EVERY 4 HOURS AS NEEDED FOR WHEEZING OR SHORTNESS OF BREATH 8.5 g 2   alendronate (FOSAMAX) 70 MG tablet TAKE ONE TABLET BY MOUTH EACH WEEK, ON AN EMPTY STOMACH BEFORE BREAKFAST WITH 8oz OF WATER AND REMAIN UPRIGHT FOR :30 4 tablet 11   alum & mag hydroxide-simeth (MAALOX/MYLANTA) 200-200-20 MG/5ML suspension Take 30 mLs by mouth every 4 (four) hours as needed for indigestion or heartburn. 355 mL 0   amoxicillin (AMOXIL) 500 MG capsule Take 500 mg by mouth 3 (three) times daily. (Patient not taking: Reported on 08/04/2022)      amoxicillin-clavulanate (AUGMENTIN) 875-125 MG tablet Take 1 tablet by mouth 2 (two) times daily. 14 tablet 0   aspirin EC 81 MG tablet Take 1 tablet (81 mg total) by mouth daily. 90 tablet 3   Budeson-Glycopyrrol-Formoterol (BREZTRI AEROSPHERE) 160-9-4.8 MCG/ACT AERO USE 2 INHALATION MY MOIUTH TWICE A DAY RINSE MOUTH AFTER EACH USE 10.7 g 11   busPIRone (BUSPAR) 5 MG tablet TAKE 1 TABLET BY MOUTH TWICE DAILY AS NEEDED FOR ANXIETY 60 tablet 2   Calcium Carbonate-Vit D-Min (CALCIUM 1200) 1200-1000 MG-UNIT CHEW Chew 1,200 mg by mouth daily. 30 each 11   Cholecalciferol (VITAMIN D3) 125 MCG (5000 UT) TABS Take by mouth. Vitamin D3 5,000 IU daily for 12 weeks then reduce to OTC Vitamin D3 2,000 IU daily for maintenance     ezetimibe (ZETIA) 10 MG tablet Take 1 tablet (10 mg total) by mouth daily. 90 tablet 3   famotidine (PEPCID) 20 MG tablet Take 1 tablet (20 mg total) by mouth 2 (two) times daily. (Patient taking differently: Take 20 mg by mouth 2 (two) times daily as needed.) 180 tablet 3   feeding supplement (ENSURE ENLIVE / ENSURE PLUS) LIQD Take 237 mLs by mouth 2 (two) times daily between meals. 237 mL 12   fluconazole (DIFLUCAN) 150 MG tablet Take one tablet by mouth on Day 1. Repeat dose 2nd tablet on Day 3. 2 tablet 0   methylPREDNISolone (MEDROL DOSEPAK) 4 MG TBPK tablet  Take as directed in the package 21 tablet 0   mometasone (ELOCON) 0.1 % lotion Apply topically.     Multiple Vitamin (MULTIVITAMIN WITH MINERALS) TABS tablet Take 1 tablet by mouth daily.     omeprazole (PRILOSEC) 20 MG capsule Take 1 capsule (20 mg total) by mouth 2 (two) times daily before a meal. 180 capsule 3   ondansetron (ZOFRAN-ODT) 4 MG disintegrating tablet Take 1 tablet (4 mg total) by mouth every 8 (eight) hours as needed for nausea or vomiting. 30 tablet 0   OXYGEN Inhale 3 L into the lungs at bedtime as needed.     roflumilast (DALIRESP) 500 MCG TABS tablet Start 1 tablet every other day for 1 week then 1 tablet  daily. (Patient not taking: Reported on 01/05/2023) 30 tablet 3   rosuvastatin (CRESTOR) 10 MG tablet Take 1 tablet (10 mg total) by mouth daily. 90 tablet 3   sodium chloride (OCEAN) 0.65 % SOLN nasal spray Place 1 spray into both nostrils as needed for congestion.     Specialty Vitamins Products (HAIR NOURISHING SUPPLEMENT PO) Take 1 tablet by mouth daily.     theophylline (THEODUR) 200 MG 12 hr tablet Take 1 tablet (200 mg total) by mouth 2 (two) times daily. 60 tablet 1   No current facility-administered medications for this visit.    No Known Allergies  Family History  Problem Relation Age of Onset   Breast cancer Mother 24   Alzheimer's disease Mother     Social History   Socioeconomic History   Marital status: Widowed    Spouse name: Not on file   Number of children: 1   Years of education: Not on file   Highest education level: Not on file  Occupational History   Occupation: Retired  Tobacco Use   Smoking status: Former    Packs/day: 0.75    Years: 41.00    Additional pack years: 0.00    Total pack years: 30.75    Types: Cigarettes    Quit date: 12/08/2015    Years since quitting: 7.2   Smokeless tobacco: Never  Vaping Use   Vaping Use: Never used  Substance and Sexual Activity   Alcohol use: No   Drug use: No   Sexual activity: Yes    Birth control/protection: Post-menopausal  Other Topics Concern   Not on file  Social History Narrative   Not on file   Social Determinants of Health   Financial Resource Strain: Medium Risk (08/04/2022)   Overall Financial Resource Strain (CARDIA)    Difficulty of Paying Living Expenses: Somewhat hard  Food Insecurity: Food Insecurity Present (08/04/2022)   Hunger Vital Sign    Worried About Running Out of Food in the Last Year: Sometimes true    Ran Out of Food in the Last Year: Sometimes true  Transportation Needs: No Transportation Needs (08/04/2022)   PRAPARE - Hydrologist (Medical): No     Lack of Transportation (Non-Medical): No  Physical Activity: Sufficiently Active (08/04/2022)   Exercise Vital Sign    Days of Exercise per Week: 7 days    Minutes of Exercise per Session: 30 min  Stress: Stress Concern Present (08/04/2022)   [place]    Feeling of Stress : Rather much  Social Connections: Socially Isolated (08/04/2022)   Social Connection and Isolation Panel [NHANES]    Frequency of Communication with Friends and Family: More than three times a  week    Frequency of Social Gatherings with Friends and Family: More than three times a week    Attends Religious Services: Never    Marine scientist or Organizations: No    Attends Archivist Meetings: Never    Marital Status: Widowed  Intimate Partner Violence: Not on file     Constitutional: Patient reports fatigue.  Denies fever, malaise, headache or abrupt weight changes.  HEENT: Denies eye pain, eye redness, ear pain, ringing in the ears, wax buildup, runny nose, nasal congestion, bloody nose, or sore throat. Respiratory: Patient reports shortness of breath, intermittent cough.  Denies difficulty breathing.   Cardiovascular: Denies chest pain, chest tightness, palpitations or swelling in the hands or feet.  Neurological: Denies dizziness, difficulty with memory, difficulty with speech or problems with balance and coordination.    No other specific complaints in a complete review of systems (except as listed in HPI above).  Objective:   Physical Exam  BP 122/74 (BP Location: Left Arm, Patient Position: Sitting, Cuff Size: Normal)   Pulse (!) 102   Temp (!) 95.9 F (35.5 C) (Temporal)   SpO2 98%   Wt Readings from Last 3 Encounters:  01/05/23 124 lb (56.2 kg)  11/12/22 124 lb 12.8 oz (56.6 kg)  10/30/22 122 lb (55.3 kg)    General: Appears her stated age, chronically ill pain, in NAD. HEENT: Head: normal shape and size, no sinus  tenderness noted; Eyes: sclera white, no icterus, conjunctiva pink, PERRLA and EOMs intact; Throat/Mouth: Teeth present, mucosa pink and moist, no exudate, lesions or ulcerations noted.  Neck: No adenopathy noted. Cardiovascular: Tachycardic with normal rhythm. S1,S2 noted.  No murmur, rubs or gallops noted. No JVD or BLE edema.  Pulmonary/Chest: Normal effort and clear but diminished breath sounds. No respiratory distress. No wheezes, rales or ronchi noted.  Musculoskeletal: She is short of breath with exertion but no difficulty with gait. Neurological: Alert and oriented.    BMET    Component Value Date/Time   NA 140 11/17/2021 1239   K 4.2 11/17/2021 1239   CL 102 11/17/2021 1239   CO2 24 11/17/2021 1239   GLUCOSE 74 11/17/2021 1239   GLUCOSE 90 01/20/2021 0801   BUN 12 11/17/2021 1239   CREATININE 0.87 11/17/2021 1239   CREATININE 0.58 01/20/2021 0801   CALCIUM 9.6 11/17/2021 1239   GFRNONAA 97 01/20/2021 0801   GFRAA 112 01/20/2021 0801    Lipid Panel     Component Value Date/Time   CHOL 165 12/22/2021 0952   TRIG 64 12/22/2021 0952   HDL 63 12/22/2021 0952   CHOLHDL 2.6 12/22/2021 0952   CHOLHDL 3.1 01/20/2021 0801   LDLCALC 89 12/22/2021 0952   LDLCALC 111 (H) 01/20/2021 0801    CBC    Component Value Date/Time   WBC 6.3 01/20/2021 0801   RBC 3.85 01/20/2021 0801   HGB 12.1 01/20/2021 0801   HGB 14.1 11/10/2018 1043   HCT 36.2 01/20/2021 0801   HCT 40.3 11/10/2018 1043   PLT 475 (H) 01/20/2021 0801   PLT 287 11/10/2018 1043   MCV 94.0 01/20/2021 0801   MCV 92 11/10/2018 1043   MCH 31.4 01/20/2021 0801   MCHC 33.4 01/20/2021 0801   RDW 12.4 01/20/2021 0801   RDW 11.8 (L) 11/10/2018 1043   LYMPHSABS 2,262 01/20/2021 0801   MONOABS 0.3 12/20/2020 0437   EOSABS 189 01/20/2021 0801   BASOSABS 101 01/20/2021 0801    Hgb A1C Lab Results  Component Value Date   HGBA1C 5.8 (H) 01/20/2021           Assessment & Plan:   COPD, Shortness of  Breath:  Clinically her lung exam is benign other than slightly diminished lungs consistent with COPD There is no evidence of COPD exacerbation No indication for steroids at this time She recently got off antibiotics, no indication to repeat this at this time She is requesting a chest x-ray today for follow-up of the pneumonia that she had 12/2022 Initially upon entering the room, her O2 sats were 98% on room air.  We walked down the hallway about 200 feet and her sats dropped to 90% on room air.  After recovery, her sats came up to 92% on room air. Advised her to wear her oxygen anytime she exerts herself even if it is short distances She will get an oxygen saturation monitor to monitor her oxygen saturations at home Will start montelukast 10 mg daily Continue Breztri and albuterol as previously prescribed Discussed the importance of her rescheduling her follow-up appointment with pulmonology as her exam is consistent with chronic hypoxic respiratory failure ER precautions discussed  Follow-up with your PCP as previously scheduled    Follow-up with your PCP as previously scheduled Webb Silversmith, NP

## 2023-02-26 ENCOUNTER — Encounter: Payer: Self-pay | Admitting: Internal Medicine

## 2023-02-26 ENCOUNTER — Ambulatory Visit (INDEPENDENT_AMBULATORY_CARE_PROVIDER_SITE_OTHER): Payer: No Typology Code available for payment source | Admitting: Internal Medicine

## 2023-02-26 ENCOUNTER — Telehealth: Payer: Self-pay | Admitting: Pulmonary Disease

## 2023-02-26 ENCOUNTER — Ambulatory Visit: Payer: Self-pay | Admitting: *Deleted

## 2023-02-26 VITALS — BP 128/62 | HR 85 | Temp 96.3°F | Wt 127.0 lb

## 2023-02-26 DIAGNOSIS — J9611 Chronic respiratory failure with hypoxia: Secondary | ICD-10-CM | POA: Diagnosis not present

## 2023-02-26 DIAGNOSIS — J189 Pneumonia, unspecified organism: Secondary | ICD-10-CM | POA: Diagnosis not present

## 2023-02-26 DIAGNOSIS — J449 Chronic obstructive pulmonary disease, unspecified: Secondary | ICD-10-CM | POA: Diagnosis not present

## 2023-02-26 MED ORDER — LEVOFLOXACIN 750 MG PO TABS: 750.0000 mg | ORAL_TABLET | Freq: Every day | ORAL | 0 refills | Status: DC

## 2023-02-26 NOTE — Patient Instructions (Signed)

## 2023-02-26 NOTE — Telephone Encounter (Signed)
Called and spoke to patient.  She stated that she has seen PCP twice this week due to SOB and pain in ribs. CXR confirmed that PNA is still present. No current abx.  C/o SOB with exertion, prod cough with green sputum, wheezing and left chest/rib discomfort. Pain was described as persistent sharpe pain.  Denied fever, sweats or additional sx.   She is using albuterol HFA QID, Breztri BID and Advil for rib pain. She stated that PCP recommended that she contact our office for CT. She is requesting an appt. Aware that Dr. Patsey Berthold does not have availability today or next week. She requested that I ask if she can be worked in.  She no showed PFT and OV 02/09/2023.  Dr. Patsey Berthold, please advise.

## 2023-02-26 NOTE — Progress Notes (Signed)
Subjective:    Patient ID: Brandi [name], female    DOB: 25-Jan-1956, 67 y.o.   MRN: [id number]  HPI  Patient presents to clinic today with complaint of left-sided rib pain and shortness of breath.  This started almost 3 months ago.  She was seen 3/19 for the same.  Chest x-ray was obtained at that time which showed:  IMPRESSION: Improved though incompletely resolved middle lobe infection.  She had been on multiple rounds of steroids and antibiotics, including Augmentin 1/31, 24, repeated 01/22/2023 and Amoxicillin 02/09/2023.  She was advised at that time to continue Breztri and Albuterol.  Montelukast was added to her regimen which has seemed to help, mainly at home. She denies cough but has some dyspnea on exertion. Her main complaint is left side rib pain. She wears oxygen as needed when feeling short of breath. She bring it with her in her car but is not wearing it in the office today and sats are room air are 96%.   She was advised to follow-up with pulmonology for evaluation of her chronic hypoxic respiratory failure.  She recently missed an appointment with them and has not rescheduled.  Review of Systems     Past Medical History:  Diagnosis Date   Hyperlipidemia     Current Outpatient Medications  Medication Sig Dispense Refill   albuterol (PROVENTIL) (2.5 MG/3ML) 0.083% nebulizer solution Take 3 mLs (2.5 mg total) by nebulization every 6 (six) hours as needed for wheezing or shortness of breath. 75 mL 12   albuterol (VENTOLIN HFA) 108 (90 Base) MCG/ACT inhaler INHALE 2 PUFFS INTO THE LUNGS EVERY 4 HOURS AS NEEDED FOR WHEEZING OR SHORTNESS OF BREATH 8.5 g 2   alendronate (FOSAMAX) 70 MG tablet TAKE ONE TABLET BY MOUTH EACH WEEK, ON AN EMPTY STOMACH BEFORE BREAKFAST WITH 8oz OF WATER AND REMAIN UPRIGHT FOR :30 4 tablet 11   alum & mag hydroxide-simeth (MAALOX/MYLANTA) 200-200-20 MG/5ML suspension Take 30 mLs by mouth every 4 (four) hours as needed for indigestion or heartburn. 355 mL  0   aspirin EC 81 MG tablet Take 1 tablet (81 mg total) by mouth daily. 90 tablet 3   Budeson-Glycopyrrol-Formoterol (BREZTRI AEROSPHERE) 160-9-4.8 MCG/ACT AERO USE 2 INHALATION MY MOIUTH TWICE A DAY RINSE MOUTH AFTER EACH USE 10.7 g 11   busPIRone (BUSPAR) 5 MG tablet TAKE 1 TABLET BY MOUTH TWICE DAILY AS NEEDED FOR ANXIETY 60 tablet 2   Calcium Carbonate-Vit D-Min (CALCIUM 1200) 1200-1000 MG-UNIT CHEW Chew 1,200 mg by mouth daily. 30 each 11   Cholecalciferol (VITAMIN D3) 125 MCG (5000 UT) TABS Take by mouth. Vitamin D3 5,000 IU daily for 12 weeks then reduce to OTC Vitamin D3 2,000 IU daily for maintenance     ezetimibe (ZETIA) 10 MG tablet Take 1 tablet (10 mg total) by mouth daily. 90 tablet 3   famotidine (PEPCID) 20 MG tablet Take 1 tablet (20 mg total) by mouth 2 (two) times daily. (Patient taking differently: Take 20 mg by mouth 2 (two) times daily as needed.) 180 tablet 3   feeding supplement (ENSURE ENLIVE / ENSURE PLUS) LIQD Take 237 mLs by mouth 2 (two) times daily between meals. 237 mL 12   mometasone (ELOCON) 0.1 % lotion Apply topically.     montelukast (SINGULAIR) 10 MG tablet Take 1 tablet (10 mg total) by mouth at bedtime. 90 tablet 0   Multiple Vitamin (MULTIVITAMIN WITH MINERALS) TABS tablet Take 1 tablet by mouth daily.     omeprazole (PRILOSEC)  20 MG capsule Take 1 capsule (20 mg total) by mouth 2 (two) times daily before a meal. 180 capsule 3   ondansetron (ZOFRAN-ODT) 4 MG disintegrating tablet Take 1 tablet (4 mg total) by mouth every 8 (eight) hours as needed for nausea or vomiting. 30 tablet 0   OXYGEN Inhale 3 L into the lungs at bedtime as needed.     roflumilast (DALIRESP) 500 MCG TABS tablet Start 1 tablet every other day for 1 week then 1 tablet daily. (Patient not taking: Reported on 01/05/2023) 30 tablet 3   rosuvastatin (CRESTOR) 10 MG tablet Take 1 tablet (10 mg total) by mouth daily. 90 tablet 3   sodium chloride (OCEAN) 0.65 % SOLN nasal spray Place 1 spray into  both nostrils as needed for congestion.     Specialty Vitamins Products (HAIR NOURISHING SUPPLEMENT PO) Take 1 tablet by mouth daily.     theophylline (THEODUR) 200 MG 12 hr tablet Take 1 tablet (200 mg total) by mouth 2 (two) times daily. 60 tablet 1   No current facility-administered medications for this visit.    No Known Allergies  Family History  Problem Relation Age of Onset   Breast cancer Mother 75   Alzheimer's disease Mother     Social History   Socioeconomic History   Marital status: Widowed    Spouse name: Not on file   Number of children: 1   Years of education: Not on file   Highest education level: Not on file  Occupational History   Occupation: Retired  Tobacco Use   Smoking status: Former    Packs/day: 0.75    Years: 41.00    Additional pack years: 0.00    Total pack years: 30.75    Types: Cigarettes    Quit date: 12/08/2015    Years since quitting: 7.2   Smokeless tobacco: Never  Vaping Use   Vaping Use: Never used  Substance and Sexual Activity   Alcohol use: No   Drug use: No   Sexual activity: Yes    Birth control/protection: Post-menopausal  Other Topics Concern   Not on file  Social History Narrative   Not on file   Social Determinants of Health   Financial Resource Strain: Medium Risk (08/04/2022)   Overall Financial Resource Strain (CARDIA)    Difficulty of Paying Living Expenses: Somewhat hard  Food Insecurity: Food Insecurity Present (08/04/2022)   Hunger Vital Sign    Worried About Running Out of Food in the Last Year: Sometimes true    Ran Out of Food in the Last Year: Sometimes true  Transportation Needs: No Transportation Needs (08/04/2022)   PRAPARE - Hydrologist (Medical): No    Lack of Transportation (Non-Medical): No  Physical Activity: Sufficiently Active (08/04/2022)   Exercise Vital Sign    Days of Exercise per Week: 7 days    Minutes of Exercise per Session: 30 min  Stress: Stress Concern  Present (08/04/2022)   [place]    Feeling of Stress : Rather much  Social Connections: Socially Isolated (08/04/2022)   Social Connection and Isolation Panel [NHANES]    Frequency of Communication with Friends and Family: More than three times a week    Frequency of Social Gatherings with Friends and Family: More than three times a week    Attends Religious Services: Never    Marine scientist or Organizations: No    Attends Club or  Organization Meetings: Never    Marital Status: Widowed  Intimate Partner Violence: Not on file     Constitutional: Denies fever, malaise, fatigue, headache or abrupt weight changes.  HEENT: Denies eye pain, eye redness, ear pain, ringing in the ears, wax buildup, runny nose, nasal congestion, bloody nose, or sore throat. Respiratory: Patient reports shortness of breath with exertion.  Denies difficulty breathing, cough or sputum production.   Cardiovascular: Denies chest pain, chest tightness, palpitations or swelling in the hands or feet.  Musculoskeletal: Patient reports left-sided rib pain.  Denies decrease in range of motion, difficulty with gait, muscle pain or joint swelling.   No other specific complaints in a complete review of systems (except as listed in HPI above).  Objective:   Physical Exam  BP 128/62 (BP Location: Left Arm, Patient Position: Sitting, Cuff Size: Normal)   Pulse 85   Temp (!) 96.3 F (35.7 C) (Temporal)   Wt 127 lb (57.6 kg)   SpO2 96%   BMI 21.80 kg/m   Wt Readings from Last 3 Encounters:  01/05/23 124 lb (56.2 kg)  11/12/22 124 lb 12.8 oz (56.6 kg)  10/30/22 122 lb (55.3 kg)    General: Appears his stated age, chronically ill appearing, in NAD. Neck:  Neck supple, trachea midline. No masses, lumps or thyromegaly present.  Cardiovascular: Normal rate and rhythm. S1,S2 noted.  No murmur, rubs or gallops noted.  Pulmonary/Chest: Normal effort  and diminished breath sounds. No respiratory distress. No wheezes, rales or ronchi noted.  Musculoskeletal: Pain with palpation of the left lateral mid ribs. No difficulty with gait.  Neurological: Alert and oriented.     BMET    Component Value Date/Time   NA 140 11/17/2021 1239   K 4.2 11/17/2021 1239   CL 102 11/17/2021 1239   CO2 24 11/17/2021 1239   GLUCOSE 74 11/17/2021 1239   GLUCOSE 90 01/20/2021 0801   BUN 12 11/17/2021 1239   CREATININE 0.87 11/17/2021 1239   CREATININE 0.58 01/20/2021 0801   CALCIUM 9.6 11/17/2021 1239   GFRNONAA 97 01/20/2021 0801   GFRAA 112 01/20/2021 0801    Lipid Panel     Component Value Date/Time   CHOL 165 12/22/2021 0952   TRIG 64 12/22/2021 0952   HDL 63 12/22/2021 0952   CHOLHDL 2.6 12/22/2021 0952   CHOLHDL 3.1 01/20/2021 0801   LDLCALC 89 12/22/2021 0952   LDLCALC 111 (H) 01/20/2021 0801    CBC    Component Value Date/Time   WBC 6.3 01/20/2021 0801   RBC 3.85 01/20/2021 0801   HGB 12.1 01/20/2021 0801   HGB 14.1 11/10/2018 1043   HCT 36.2 01/20/2021 0801   HCT 40.3 11/10/2018 1043   PLT 475 (H) 01/20/2021 0801   PLT 287 11/10/2018 1043   MCV 94.0 01/20/2021 0801   MCV 92 11/10/2018 1043   MCH 31.4 01/20/2021 0801   MCHC 33.4 01/20/2021 0801   RDW 12.4 01/20/2021 0801   RDW 11.8 (L) 11/10/2018 1043   LYMPHSABS 2,262 01/20/2021 0801   MONOABS 0.3 12/20/2020 0437   EOSABS 189 01/20/2021 0801   BASOSABS 101 01/20/2021 0801    Hgb A1C Lab Results  Component Value Date   HGBA1C 5.8 (H) 01/20/2021           Assessment & Plan:    Community Acquired Pneumonia, COPD with CHRF:  Chest xray reviewed with patient Given persistent infiltrate, will treat with Levaquin 750 mg x 7 days No indication to repeat steroids  at this time Continue Albuterol, Theophylline, Breztri and Daliresp Continue supplemental oxygen as needed Strongly encouraged her to follow-up with her pulmonologist as she may need a chest CT if  symptoms continue to persist  Follow-up with your PCP as previously scheduled Webb Silversmith, NP

## 2023-02-26 NOTE — Telephone Encounter (Signed)
I reviewed her last chest x-ray and it actually looks IMPROVED from her prior.  She missed the appointment for PFTs and for follow-up as we requested she do.  May be able to work her in on Monday afternoon.  She has to be consistent with that appointment follow-up.

## 2023-02-26 NOTE — Telephone Encounter (Signed)
Pt states she has been to PCP 2x this week is in pain at ribs. Pneumonia still in lungs. Is asking for tests to be ordered by Dr. Darnell Level

## 2023-02-26 NOTE — Telephone Encounter (Signed)
Patient is aware of below message and voiced her understanding.  Appt scheduled 03/01/2023 at 11:30. Advised patient to present to ED if sx worsen over the week. She voiced her understanding.  Nothing further needed.

## 2023-02-26 NOTE — Telephone Encounter (Signed)
  Chief Complaint: left rib pain constant , requesting x ray results and if she can take ibuprofen for pain  Symptoms: left rib area constant pain. Sharp  started after leaving OV 02/23/23. No able to sleep and took pm sleeping pill last night. New medicine singular has helped patient .  Frequency: 02/23/23 Pertinent Negatives: Patient denies chest pain no difficulty breathing no fever.  Disposition: [] ED /[] Urgent Care (no appt availability in office) / [x] Appointment(In office/virtual)/ []  [facility]/ [] Home Care/ [] Refused Recommended Disposition /[] [facility]/ []  Follow-up with PCP Additional Notes:   Requesting to see PCP only but no available OV . Scheduled today with Avie Echevaria, NP. Patient would like a call back regarding Xray results. Review of chart no doc. Noted from PCP in review of results. Requesting call back if she can take ibuprofen prior to OV from PCP.     Reason for Disposition  [1] SEVERE pain (e.g., excruciating, unable to do any normal activities) AND [2] not improved 2 hours after pain medicine  Answer Assessment - Initial Assessment Questions 1. ONSET: "When did the muscle aches or body pains start?"      02/23/23 2. LOCATION: "What part of your body is hurting?" (e.g., entire body, arms, legs)      Constant left rib pain  3. SEVERITY: "How bad is the pain?" (Scale 1-10; or mild, moderate, severe)   - MILD (1-3): doesn't interfere with normal activities    - MODERATE (4-7): interferes with normal activities or awakens from sleep    - SEVERE (8-10):  excruciating pain, unable to do any normal activities      Moderate 7 can not sleep  4. CAUSE: "What do you think is causing the pains?"     Not sure  5. FEVER: "Have you been having fever?"     na 6. OTHER SYMPTOMS: "Do you have any other symptoms?" (e.g., chest pain, weakness, rash, cold or flu symptoms, weight loss)     No  7. PREGNANCY: "Is there any chance you are pregnant?" "When was your  last menstrual period?"     na 8. TRAVEL: "Have you traveled out of the country in the last month?" (e.g., travel history, exposures)     na  Protocols used: Muscle Aches and Body Pain-A-AH

## 2023-03-01 ENCOUNTER — Ambulatory Visit (INDEPENDENT_AMBULATORY_CARE_PROVIDER_SITE_OTHER): Payer: No Typology Code available for payment source | Admitting: Pulmonary Disease

## 2023-03-01 ENCOUNTER — Telehealth: Payer: Self-pay

## 2023-03-01 ENCOUNTER — Other Ambulatory Visit
Admission: RE | Admit: 2023-03-01 | Discharge: 2023-03-01 | Disposition: A | Discharge status: Home / Self Care | Payer: No Typology Code available for payment source | Attending: Pulmonary Disease | Admitting: Pulmonary Disease

## 2023-03-01 ENCOUNTER — Encounter: Payer: Self-pay | Admitting: Pulmonary Disease

## 2023-03-01 ENCOUNTER — Other Ambulatory Visit: Payer: Self-pay

## 2023-03-01 VITALS — BP 124/78 | HR 89 | Temp 97.3°F | Ht 64.0 in | Wt 122.0 lb

## 2023-03-01 DIAGNOSIS — R0789 Other chest pain: Secondary | ICD-10-CM | POA: Diagnosis not present

## 2023-03-01 DIAGNOSIS — J449 Chronic obstructive pulmonary disease, unspecified: Secondary | ICD-10-CM | POA: Insufficient documentation

## 2023-03-01 DIAGNOSIS — G4736 Sleep related hypoventilation in conditions classified elsewhere: Secondary | ICD-10-CM

## 2023-03-01 DIAGNOSIS — R0602 Shortness of breath: Secondary | ICD-10-CM

## 2023-03-01 DIAGNOSIS — J439 Emphysema, unspecified: Secondary | ICD-10-CM

## 2023-03-01 DIAGNOSIS — Z5181 Encounter for therapeutic drug level monitoring: Secondary | ICD-10-CM | POA: Diagnosis not present

## 2023-03-01 DIAGNOSIS — M810 Age-related osteoporosis without current pathological fracture: Secondary | ICD-10-CM

## 2023-03-01 LAB — CBC WITH DIFFERENTIAL/PLATELET
Abs Immature Granulocytes: 0.06 10*3/uL (ref 0.00–0.07)
Basophils Absolute: 0.1 10*3/uL (ref 0.0–0.1)
Basophils Relative: 1 %
Eosinophils Absolute: 0.3 10*3/uL (ref 0.0–0.5)
Eosinophils Relative: 4 %
HCT: 42.5 % (ref 36.0–46.0)
Hemoglobin: 13.8 g/dL (ref 12.0–15.0)
Immature Granulocytes: 1 %
Lymphocytes Relative: 14 %
Lymphs Abs: 1.1 10*3/uL (ref 0.7–4.0)
MCH: 32 pg (ref 26.0–34.0)
MCHC: 32.5 g/dL (ref 30.0–36.0)
MCV: 98.6 fL (ref 80.0–100.0)
Monocytes Absolute: 0.8 10*3/uL (ref 0.1–1.0)
Monocytes Relative: 10 %
Neutro Abs: 5.2 10*3/uL (ref 1.7–7.7)
Neutrophils Relative %: 70 %
Platelets: 288 10*3/uL (ref 150–400)
RBC: 4.31 MIL/uL (ref 3.87–5.11)
RDW: 12.5 % (ref 11.5–15.5)
WBC: 7.5 10*3/uL (ref 4.0–10.5)
nRBC: 0 % (ref 0.0–0.2)

## 2023-03-01 LAB — D-DIMER, QUANTITATIVE: D-Dimer, Quant: <0.27 ug/mL-FEU (ref 0.00–0.50)

## 2023-03-01 LAB — RENAL FUNCTION PANEL
Albumin: 4.1 g/dL (ref 3.5–5.0)
Anion gap: 8 (ref 5–15)
BUN: 12 mg/dL (ref 8–23)
CO2: 28 mmol/L (ref 22–32)
Calcium: 9.2 mg/dL (ref 8.9–10.3)
Chloride: 102 mmol/L (ref 98–111)
Creatinine, Ser: 0.61 mg/dL (ref 0.44–1.00)
GFR, Estimated: >60 mL/min (ref >60)
Glucose, Bld: 118 mg/dL — ABNORMAL HIGH (ref 70–99)
Phosphorus: 3.2 mg/dL (ref 2.5–4.6)
Potassium: 3.7 mmol/L (ref 3.5–5.1)
Sodium: 138 mmol/L (ref 135–145)

## 2023-03-01 MED ORDER — TRELEGY ELLIPTA 200-62.5-25 MCG/ACT IN AEPB: 1.0000 | INHALATION_SPRAY | Freq: Every day | RESPIRATORY_TRACT | 0 refills | Status: DC

## 2023-03-01 MED ORDER — ALENDRONATE SODIUM 70 MG PO TABS: ORAL_TABLET | ORAL | 1 refills | Status: DC

## 2023-03-01 NOTE — Telephone Encounter (Signed)
I notified the patient. Nothing further needed. 

## 2023-03-01 NOTE — Patient Instructions (Signed)
We are getting some blood work today.  The order has been sent to the lab.  Depending on what the blood work shows I will determine what type of CT scan you need.  We will order it accordingly.  We are changing your maintenance inhaler.  We are giving you a trial of TRELEGY ELLIPTA which is 1 puff daily.  Start using it tomorrow.  Make sure you rinse your mouth well after you use it.  DO NOT USE THE BREZTRI ONCE YOU START THE TRELEGY.  Once I see the results of the lab work and the CT scan that you are to get we will call you with any necessary changes on the medications.  Will see you in follow-up in 2 to 3 weeks time with either me or the nurse practitioner at that time.

## 2023-03-01 NOTE — Progress Notes (Signed)
Subjective:    Patient ID: Brandi [name], female    DOB: 1956/08/06, 67 y.o.   MRN: [id number] Patient Care Team: Olin Hauser, DO as PCP - General (Family Medicine) Tyler Pita, MD as Consulting Physician (Pulmonary Disease)  Chief Complaint  Patient presents with   Follow-up    SOB. Some wheezing. Occasional cough.    HPI Patient is a 67 year old former smoker (31 PY) who presents acute visit due to worsening shortness of breath over the course of months.  The patient was last seen on 05 January 2023, at that time she was noted to have a right middle lobe pneumonia, she was treated with antibiotics, she was instructed to return for follow-up and also scheduled for repeat pulmonary function testing.  She did not follow-up and she did not present for pulmonary function testing (no-show).  She presents today as an acute visit after having been seen in primary care on 19 March and 22 March.  Chest x-ray was obtained that showed a very small residual of her prior right middle lobe infection.  She also complains of "rib pain" and points to her epigastric area and left upper quadrant area.  She has not had any recent fevers, chills or sweats.  Just states she does not feel well.  As noted she has been having issues with worsening shortness of breath since November.  She has been noting some early satiety.  Has not had any weight loss.  She is compliant with oxygen at 4 L/min nocturnally.  She cites compliance with Breztri 2 puffs twice a day cites compliance with albuterol.  Has not had chest pain per se.  Cough has not been productive.  Has noted some wheezing on occasion.  She does not endorse any other symptomatology.   DATA 04/09/2021 LDCT: Moderate to severe paraseptal and centrilobular emphysema.  Left upper lobe nodule measuring 2 mm (decreased from prior), compression fractures T7 and T9 noted previously and unchanged.  Coronary artery calcifications. 07/04/2021 alpha-1  antitrypsin: Phenotype MM, level 140 mg/dL (normal) 07/09/2021 overnight oximetry: Baseline O2 sat 90% during sleep with desaturations as low as 83% during the night desaturation events lasted for over an hour. 08/14/2021 PFTs: FEV1 1.13 L or 46% predicted, FVC 2.42 L or 76% predicted, FEV1/FVC 47%, there is a mild response to bronchodilator with a 10% net change.  There is hyperinflation with TLC at 128% and air trapping with RV at 188%.  Diffusion capacity is moderately to severely reduced by Kco.  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 08/14/2021 echocardiogram: LVEF 60 to 65%, grade 1 DD 12/04/2021 chest x-ray: Emphysema with no evidence of acute cardiopulmonary disease 12/24/2021 overnight oximetry: Baseline O2 sat 90% desaturations as low as 83%.   12/26/2021 nuclear medicine stress test: No significant ischemia, normal wall motion, EF estimated at 77%, coronary calcification in the LAD, mild aortic atherosclerosis 01/05/2023 chest x-ray PA and lateral: Infiltrates in the right middle lobe favor to be infectious/inflammatory 02/23/2023 chest x-ray PA and lateral: Improved though incompletely resolved middle lobe infection, minimal residual, emphysema   Review of Systems A 10 point review of systems was performed and it is as noted above otherwise negative.  Patient Active Problem List   Diagnosis Date Noted   Pre-diabetes 04/08/2021   Vitamin D deficiency 04/08/2021   COVID-19 vaccination declined 12/04/2020   Centrilobular emphysema ([place]) 04/08/2020   Nodule of upper lobe of left lung 04/08/2020   Aortic atherosclerosis ([place]) 05/21/2019  Coronary artery calcification seen on CT scan 05/21/2019   Former smoker 05/21/2019   Hyperlipidemia 05/21/2019   Social History   Tobacco Use   Smoking status: Former    Packs/day: 0.75    Years: 41.00    Additional pack years: 0.00    Total pack years: 30.75    Types: Cigarettes    Quit date: 12/08/2015    Years since  quitting: 7.2   Smokeless tobacco: Never  Substance Use Topics   Alcohol use: No   No Known Allergies  Current Meds  Medication Sig   albuterol (PROVENTIL) (2.5 MG/3ML) 0.083% nebulizer solution Take 3 mLs (2.5 mg total) by nebulization every 6 (six) hours as needed for wheezing or shortness of breath.   albuterol (VENTOLIN HFA) 108 (90 Base) MCG/ACT inhaler INHALE 2 PUFFS INTO THE LUNGS EVERY 4 HOURS AS NEEDED FOR WHEEZING OR SHORTNESS OF BREATH   alendronate (FOSAMAX) 70 MG tablet TAKE ONE TABLET BY MOUTH EACH WEEK, ON AN EMPTY STOMACH BEFORE BREAKFAST WITH 8oz OF WATER AND REMAIN UPRIGHT FOR :30   alum & mag hydroxide-simeth (MAALOX/MYLANTA) 200-200-20 MG/5ML suspension Take 30 mLs by mouth every 4 (four) hours as needed for indigestion or heartburn.   aspirin EC 81 MG tablet Take 1 tablet (81 mg total) by mouth daily.   Budeson-Glycopyrrol-Formoterol (BREZTRI AEROSPHERE) 160-9-4.8 MCG/ACT AERO USE 2 INHALATION MY MOIUTH TWICE A DAY RINSE MOUTH AFTER EACH USE   busPIRone (BUSPAR) 5 MG tablet TAKE 1 TABLET BY MOUTH TWICE DAILY AS NEEDED FOR ANXIETY   Calcium Carbonate-Vit D-Min (CALCIUM 1200) 1200-1000 MG-UNIT CHEW Chew 1,200 mg by mouth daily.   Cholecalciferol (VITAMIN D3) 125 MCG (5000 UT) TABS Take by mouth. Vitamin D3 5,000 IU daily for 12 weeks then reduce to OTC Vitamin D3 2,000 IU daily for maintenance   ezetimibe (ZETIA) 10 MG tablet Take 1 tablet (10 mg total) by mouth daily.   famotidine (PEPCID) 20 MG tablet Take 1 tablet (20 mg total) by mouth 2 (two) times daily. (Patient taking differently: Take 20 mg by mouth 2 (two) times daily as needed.)   feeding supplement (ENSURE ENLIVE / ENSURE PLUS) LIQD Take 237 mLs by mouth 2 (two) times daily between meals.   levofloxacin (LEVAQUIN) 750 MG tablet Take 1 tablet (750 mg total) by mouth daily.   mometasone (ELOCON) 0.1 % lotion Apply topically.   montelukast (SINGULAIR) 10 MG tablet Take 1 tablet (10 mg total) by mouth at bedtime.    Multiple Vitamin (MULTIVITAMIN WITH MINERALS) TABS tablet Take 1 tablet by mouth daily.   omeprazole (PRILOSEC) 20 MG capsule Take 1 capsule (20 mg total) by mouth 2 (two) times daily before a meal.   ondansetron (ZOFRAN-ODT) 4 MG disintegrating tablet Take 1 tablet (4 mg total) by mouth every 8 (eight) hours as needed for nausea or vomiting.   OXYGEN Inhale 3 L into the lungs at bedtime as needed.   rosuvastatin (CRESTOR) 10 MG tablet Take 1 tablet (10 mg total) by mouth daily.   sodium chloride (OCEAN) 0.65 % SOLN nasal spray Place 1 spray into both nostrils as needed for congestion.   Specialty Vitamins Products (HAIR NOURISHING SUPPLEMENT PO) Take 1 tablet by mouth daily.   theophylline (THEODUR) 200 MG 12 hr tablet Take 1 tablet (200 mg total) by mouth 2 (two) times daily.   Immunization History  Administered Date(s) Administered   Moderna Sars-Covid-2 Vaccination 05/26/2021, 06/24/2021      Objective:   Physical Exam BP 124/78 (BP  Location: Right Arm, Cuff Size: Normal)   Pulse 89   Temp (!) 97.3 F (36.3 C)   Ht 5\' 4"  (1.626 m)   Wt 122 lb (55.3 kg) Comment: per patient. in a wheelchair today  SpO2 94%   BMI 20.94 kg/m   SpO2: 94 % O2 Device: None (Room air)  GENERAL: Well-developed, well-nourished woman, no acute distress.  Presents in transport chair due to dyspnea.  Uncomfortable but no tachypnea, no conversational dyspnea.   HEAD: Normocephalic, atraumatic.  EYES: Pupils equal, round, reactive to light.  No scleral icterus.  MOUTH: Poor dentition.  She does have visible nasal septal deviation. NECK: Supple. No thyromegaly. Trachea midline. No JVD.  No adenopathy. PULMONARY: Distant breath sounds, fair air movement. Coarse, otherwise no adventitious sounds. CARDIOVASCULAR: S1 and S2. Regular rate and rhythm.  No rubs, murmurs or gallops heard. ABDOMEN: Benign. MUSCULOSKELETAL: No joint deformity, no clubbing, no edema.  NEUROLOGIC: No focal deficit, speech is  fluent. SKIN: Intact,warm,dry. PSYCH: Mood anxious.  Comparison chest x-ray performed 1/30 to chest x-ray performed 3/19:     Ambulatory oximetry was performed today: Patient was only able to do 2 laps around the clinic.  Resting heart rate was 94 bpm, resting O2 sat 93% on room air, O2 sat increased to 95% during exercise and towards the end of the study settled at 92%.  Assessment & Plan:     ICD-10-CM   1. Stage 3 severe COPD by GOLD classification ([place])  J44.9 Theophylline level    CT CHEST W CONTRAST    CANCELED: Theophylline level   Will switch Breztri to Trelegy 200 Patient instructed on proper inhaler use Will need reassessment with PFTs Will need to get her more compensated    2. Shortness of breath  R06.02 CBC w/Diff    Renal Function Panel    D-Dimer, Quantitative    CT CHEST W CONTRAST    CANCELED: D-Dimer, Quantitative    CANCELED: Renal Function Panel    CANCELED: CBC w/Diff   Will obtain CBC, renal panel, D-dimer If D-dimer negative, standard CT chest with contrast If D-dimer positive CT angio chest    3. Nocturnal hypoxemia due to emphysema ([facility])  J43.9    G47.36    Continue oxygen at 4 L/min nocturnally    4. Other chest pain  R07.89 CT CHEST W CONTRAST   CT chest as above    5. Medication monitoring encounter  Z51.81    Will obtain theophylline level today     Orders Placed This Encounter  Procedures   CT CHEST W CONTRAST    Standing Status:   Future    Standing Expiration Date:   02/29/2024    Order Specific Question:   If indicated for the ordered procedure, I authorize the administration of contrast media per Radiology protocol    Answer:   Yes    Order Specific Question:   Preferred imaging location?    Answer:   [facility]   CBC w/Diff    Standing Status:   Future    Number of Occurrences:   1    Standing Expiration Date:   02/29/2024   Renal Function Panel    Standing Status:   Future    Number of Occurrences:   1    Standing  Expiration Date:   05/01/2023   D-Dimer, Quantitative    Standing Status:   Future    Number of Occurrences:   1    Standing Expiration Date:  05/01/2023   Theophylline level    Standing Status:   Future    Number of Occurrences:   1    Standing Expiration Date:   02/29/2024   Meds ordered this encounter  Medications   Fluticasone-Umeclidin-Vilant (TRELEGY ELLIPTA) 200-62.5-25 MCG/ACT AEPB    Sig: Inhale 1 puff into the lungs daily.    Dispense:  28 each    Refill:  0    Order Specific Question:   Lot Number?    Answer:   [id number]    Order Specific Question:   Expiration Date?    Answer:   05/07/2024    Order Specific Question:   Quantity    Answer:   2   Will see the patient in follow-up in 2 to 3 weeks time she is to contact us prior to that time should any new difficulties arise.  Renold Don, MD Advanced Bronchoscopy P[facility]    *This note was dictated using voice recognition software/Dragon.  Despite best efforts to proofread, errors can occur which can change the meaning. Any transcriptional errors that result from this process are unintentional and may not be fully corrected at the time of dictation.

## 2023-03-01 NOTE — Telephone Encounter (Signed)
This is not due to allergies.  We changed her inhaler today we will have to see how this change works for her.  In addition we have to wait to see what the CT shows.

## 2023-03-01 NOTE — Telephone Encounter (Signed)
-----   Message from Tyler Pita, MD sent at 03/01/2023  2:31 PM EDT ----- Patient's D-dimer is not elevated.  She will need standard CT chest with contrast to evaluate chest pain and shortness of breath.  The remainder of her blood work was unremarkable.  Still waiting on her theophylline level but that will not be back for a few days.

## 2023-03-01 NOTE — Telephone Encounter (Signed)
I notified the patient of her lab results. She is asking if her allergies (pollen) could be causing her SOB? She said one of her other doctors just put her on an allergy medication(pink pill) and she was wondering if there is something else that could help?

## 2023-03-02 ENCOUNTER — Ambulatory Visit: Payer: Self-pay

## 2023-03-02 ENCOUNTER — Other Ambulatory Visit: Payer: Self-pay | Admitting: Family Medicine

## 2023-03-02 ENCOUNTER — Ambulatory Visit: Payer: No Typology Code available for payment source | Admitting: Pulmonary Disease

## 2023-03-02 ENCOUNTER — Other Ambulatory Visit: Payer: Self-pay

## 2023-03-02 ENCOUNTER — Ambulatory Visit
Admission: EM | Admit: 2023-03-02 | Discharge: 2023-03-02 | Disposition: A | Discharge status: Home / Self Care | Payer: No Typology Code available for payment source | Attending: Physician Assistant | Admitting: Physician Assistant

## 2023-03-02 DIAGNOSIS — B029 Zoster without complications: Secondary | ICD-10-CM

## 2023-03-02 DIAGNOSIS — R7303 Prediabetes: Secondary | ICD-10-CM

## 2023-03-02 DIAGNOSIS — E782 Mixed hyperlipidemia: Secondary | ICD-10-CM

## 2023-03-02 DIAGNOSIS — J432 Centrilobular emphysema: Secondary | ICD-10-CM

## 2023-03-02 HISTORY — DX: Chronic obstructive pulmonary disease, unspecified: J44.9

## 2023-03-02 LAB — THEOPHYLLINE LEVEL: Theophylline Lvl: <0.8 ug/mL — ABNORMAL LOW (ref 10.0–20.0)

## 2023-03-02 MED ORDER — VALACYCLOVIR HCL 1 G PO TABS: 1000.0000 mg | ORAL_TABLET | Freq: Three times a day (TID) | ORAL | 0 refills | Status: AC

## 2023-03-02 MED ORDER — TRAMADOL HCL 50 MG PO TABS: 50.0000 mg | ORAL_TABLET | Freq: Four times a day (QID) | ORAL | 0 refills | Status: AC | PRN

## 2023-03-02 MED ORDER — VALACYCLOVIR HCL 1 G PO TABS: 1000.0000 mg | ORAL_TABLET | Freq: Three times a day (TID) | ORAL | 0 refills | Status: DC

## 2023-03-02 MED ORDER — TRAMADOL HCL 50 MG PO TABS: 50.0000 mg | ORAL_TABLET | Freq: Four times a day (QID) | ORAL | 0 refills | Status: DC | PRN

## 2023-03-02 NOTE — Discharge Instructions (Addendum)
-  Rash is consistent with shingles.  I sent medication to pharmacy. - You are contagious to anyone knows not had chickenpox.  Avoid young children, babies, pregnant women, immunocompromise. - You are contagious until the rash crusted over which takes about a week. - Keep the area covered.

## 2023-03-02 NOTE — ED Provider Notes (Signed)
[facility]    CSN: [id number] Arrival date & time: 03/02/23  1741      History   Chief Complaint Chief Complaint  Patient presents with   Rash    HPI Brandi [name] is a 67 y.o. female with a history of COPD, hyperlipidemia, CAD, and pre-diabetes.   Patient reports recent diagnosis of pneumonia and COPD exacerbation about 4 days ago. Currently on Levaquin. States that she has been having left sided rib and flank pain for several days, but only noticed a rash on her torso today. Rash is painful to touch. No history of shingles. Has been taking ibuprofen for pain without relief.  No fever, chest pain. No recent worsening of shortness of breath. No weakness and no other concerns today.  HPI  Past Medical History:  Diagnosis Date   COPD (chronic obstructive pulmonary disease) ([place])    Hyperlipidemia     Patient Active Problem List   Diagnosis Date Noted   Pre-diabetes 04/08/2021   Vitamin D deficiency 04/08/2021   COVID-19 vaccination declined 12/04/2020   Centrilobular emphysema ([place]) 04/08/2020   Nodule of upper lobe of left lung 04/08/2020   Aortic atherosclerosis ([place]) 05/21/2019   Coronary artery calcification seen on CT scan 05/21/2019   Former smoker 05/21/2019   Hyperlipidemia 05/21/2019    Past Surgical History:  Procedure Laterality Date   APPENDECTOMY     CHOLECYSTECTOMY     SHOULDER ARTHROSCOPY Left 07/02/2015   Procedure: ,left shoulder arthroscopy, decompression and debridement;  Surgeon: Corky Mull, MD;  Location: [facility] ORS;  Service: Orthopedics;  Laterality: Left;   SHOULDER CLOSED REDUCTION Left 04/09/2015   Procedure: CLOSED MANIPULATION SHOULDER;  Surgeon: Hessie Knows, MD;  Location: [facility] ORS;  Service: Orthopedics;  Laterality: Left;    OB History     Gravida  1   Para  1   Term  1   Preterm      AB      Living  1      SAB      IAB      Ectopic      Multiple      Live Births  1            Home  Medications    Prior to Admission medications   Medication Sig Start Date End Date Taking? Authorizing Provider  albuterol (PROVENTIL) (2.5 MG/3ML) 0.083% nebulizer solution Take 3 mLs (2.5 mg total) by nebulization every 6 (six) hours as needed for wheezing or shortness of breath. 11/17/21   Tyler Pita, MD  albuterol (VENTOLIN HFA) 108 (90 Base) MCG/ACT inhaler INHALE 2 PUFFS INTO THE LUNGS EVERY 4 HOURS AS NEEDED FOR WHEEZING OR SHORTNESS OF BREATH 11/26/22   [name], Devonne Doughty, DO  alendronate (FOSAMAX) 70 MG tablet TAKE ONE TABLET BY MOUTH EACH WEEK, ON AN EMPTY STOMACH BEFORE BREAKFAST WITH 8oz OF WATER AND REMAIN UPRIGHT FOR :30 03/01/23   [name], Devonne Doughty, DO  alum & mag hydroxide-simeth (MAALOX/MYLANTA) 200-200-20 MG/5ML suspension Take 30 mLs by mouth every 4 (four) hours as needed for indigestion or heartburn. 12/23/20   Ezekiel Slocumb, DO  aspirin EC 81 MG tablet Take 1 tablet (81 mg total) by mouth daily. 05/22/19   Minna Merritts, MD  busPIRone (BUSPAR) 5 MG tablet TAKE 1 TABLET BY MOUTH TWICE DAILY AS NEEDED FOR ANXIETY 11/26/22   [name], Devonne Doughty, DO  Calcium Carbonate-Vit D-Min (CALCIUM 1200) 1200-1000 MG-UNIT CHEW Chew 1,200 mg by mouth  daily. 01/31/19   Homero Fellers, MD  Cholecalciferol (VITAMIN D3) 125 MCG (5000 UT) TABS Take by mouth. Vitamin D3 5,000 IU daily for 12 weeks then reduce to OTC Vitamin D3 2,000 IU daily for maintenance    [provider]  ezetimibe (ZETIA) 10 MG tablet Take 1 tablet (10 mg total) by mouth daily. 04/15/21   Minna Merritts, MD  famotidine (PEPCID) 20 MG tablet Take 1 tablet (20 mg total) by mouth 2 (two) times daily. Patient taking differently: Take 20 mg by mouth 2 (two) times daily as needed. 10/22/21   [name], Devonne Doughty, DO  feeding supplement (ENSURE ENLIVE / ENSURE PLUS) LIQD Take 237 mLs by mouth 2 (two) times daily between meals. 12/23/20   Ezekiel Slocumb, DO   Fluticasone-Umeclidin-Vilant (TRELEGY ELLIPTA) 200-62.5-25 MCG/ACT AEPB Inhale 1 puff into the lungs daily. 03/01/23   Tyler Pita, MD  levofloxacin (LEVAQUIN) 750 MG tablet Take 1 tablet (750 mg total) by mouth daily. 02/26/23   Jearld Fenton, NP  mometasone (ELOCON) 0.1 % lotion Apply topically. 02/11/22   [provider]  montelukast (SINGULAIR) 10 MG tablet Take 1 tablet (10 mg total) by mouth at bedtime. 02/23/23   Jearld Fenton, NP  Multiple Vitamin (MULTIVITAMIN WITH MINERALS) TABS tablet Take 1 tablet by mouth daily. 12/24/20   Ezekiel Slocumb, DO  omeprazole (PRILOSEC) 20 MG capsule Take 1 capsule (20 mg total) by mouth 2 (two) times daily before a meal. 06/23/22   [name], Devonne Doughty, DO  ondansetron (ZOFRAN-ODT) 4 MG disintegrating tablet Take 1 tablet (4 mg total) by mouth every 8 (eight) hours as needed for nausea or vomiting. 03/11/22   [name], Devonne Doughty, DO  OXYGEN Inhale 3 L into the lungs at bedtime as needed.    [provider]  roflumilast (DALIRESP) 500 MCG TABS tablet Start 1 tablet every other day for 1 week then 1 tablet daily. Patient not taking: Reported on 01/05/2023 11/12/22   Tyler Pita, MD  rosuvastatin (CRESTOR) 10 MG tablet Take 1 tablet (10 mg total) by mouth daily. 02/09/22   Minna Merritts, MD  sodium chloride (OCEAN) 0.65 % SOLN nasal spray Place 1 spray into both nostrils as needed for congestion.    [provider]  Specialty Vitamins Products (HAIR NOURISHING SUPPLEMENT PO) Take 1 tablet by mouth daily.    [provider]  theophylline (THEODUR) 200 MG 12 hr tablet Take 1 tablet (200 mg total) by mouth 2 (two) times daily. 01/05/23   Tyler Pita, MD  traMADol (ULTRAM) 50 MG tablet Take 1 tablet (50 mg total) by mouth every 6 (six) hours as needed for up to 3 days for severe pain. 03/02/23 03/05/23  Danton Clap, PA-C  valACYclovir (VALTREX) 1000 MG tablet Take 1 tablet (1,000 mg total) by mouth 3  (three) times daily for 7 days. 03/02/23 03/09/23  Danton Clap, PA-C    Family History Family History  Problem Relation Age of Onset   Breast cancer Mother 69   Alzheimer's disease Mother     Social History Social History   Tobacco Use   Smoking status: Former    Packs/day: 0.75    Years: 41.00    Additional pack years: 0.00    Total pack years: 30.75    Types: Cigarettes    Quit date: 12/08/2015    Years since quitting: 7.2   Smokeless tobacco: Never  Vaping Use   Vaping Use: Never  used  Substance Use Topics   Alcohol use: No   Drug use: No     Allergies   Patient has no known allergies.   Review of Systems Review of Systems  Constitutional:  Negative for fatigue and fever.  Respiratory:  Positive for cough. Negative for shortness of breath.   Cardiovascular:  Negative for chest pain.  Gastrointestinal:  Negative for abdominal pain.  Musculoskeletal:  Positive for myalgias.  Skin:  Positive for rash.  Neurological:  Negative for weakness.     Physical Exam Triage Vital Signs ED Triage Vitals  Enc Vitals Group     BP      Pulse      Resp      Temp      Temp src      SpO2      Weight      Height      Head Circumference      Peak Flow      Pain Score      Pain Loc      Pain Edu?      Excl. in [place]?    No data found.  Updated Vital Signs BP 120/81   Pulse (!) 102   Temp 98.5 F (36.9 C)   Resp 20   SpO2 94%     Physical Exam Vitals and nursing note reviewed.  Constitutional:      General: She is not in acute distress.    Appearance: Normal appearance. She is not ill-appearing or toxic-appearing.  HENT:     Head: Normocephalic and atraumatic.     Nose: Nose normal.     Mouth/Throat:     Mouth: Mucous membranes are moist.     Pharynx: Oropharynx is clear.  Eyes:     General: No scleral icterus.       Right eye: No discharge.        Left eye: No discharge.     Conjunctiva/sclera: Conjunctivae normal.  Cardiovascular:     Rate and  Rhythm: Normal rate and regular rhythm.     Heart sounds: Normal heart sounds.  Pulmonary:     Effort: Pulmonary effort is normal. No respiratory distress.     Breath sounds: Normal breath sounds.  Musculoskeletal:     Cervical back: Neck supple.  Skin:    General: Skin is dry.     Findings: Rash (erythematous maculopapular rash of the left anterior lower chest and left flank. Does not cross midline. TTP.) present.  Neurological:     General: No focal deficit present.     Mental Status: She is alert. Mental status is at baseline.     Motor: No weakness.     Gait: Gait normal.  Psychiatric:        Mood and Affect: Mood normal.        Behavior: Behavior normal.        Thought Content: Thought content normal.      UC Treatments / Results  Labs (all labs ordered are listed, but only abnormal results are displayed) Labs Reviewed - No data to display  EKG   Radiology No results found.  Procedures Procedures (including critical care time)  Medications Ordered in UC Medications - No data to display  Initial Impression / Assessment and Plan / UC Course  I have reviewed the triage vital signs and the nursing notes.  Pertinent labs & imaging results that were available during my care of the patient were reviewed by  me and considered in my medical decision making (see chart for details).   67 year old female presents for rash of the left ribs and flank that she noticed today.  Has had pain in this region for several days.  Recent diagnosis of pneumonia and currently taking Levaquin.  She is not have any other areas of rash.  Rash is tender to palpation.  No fever.  Has had some bodyaches.  On exam today rash is consistent with herpes zoster/shingles.  Will treat with Valtrex.  Also sent tramadol as needed for pain since over-the-counter ibuprofen has not really helped.  Reviewed who she is contagious to and how it could be spread to others.  Reviewed return and ER  precautions.   Final Clinical Impressions(s) / UC Diagnoses   Final diagnoses:  Herpes zoster without complication     Discharge Instructions      -Rash is consistent with shingles.  I sent medication to pharmacy. - You are contagious to anyone knows not had chickenpox.  Avoid young children, babies, pregnant women, immunocompromise. - You are contagious until the rash crusted over which takes about a week. - Keep the area covered.     ED Prescriptions     Medication Sig Dispense Auth. Provider   traMADol (ULTRAM) 50 MG tablet  (Status: Discontinued) Take 1 tablet (50 mg total) by mouth every 6 (six) hours as needed for up to 3 days for severe pain. 9 tablet Laurene Footman B, PA-C   valACYclovir (VALTREX) 1000 MG tablet  (Status: Discontinued) Take 1 tablet (1,000 mg total) by mouth 3 (three) times daily. 21 tablet Laurene Footman B, PA-C   traMADol (ULTRAM) 50 MG tablet Take 1 tablet (50 mg total) by mouth every 6 (six) hours as needed for up to 3 days for severe pain. 9 tablet Laurene Footman B, PA-C   valACYclovir (VALTREX) 1000 MG tablet Take 1 tablet (1,000 mg total) by mouth 3 (three) times daily for 7 days. 21 tablet Danton Clap, PA-C      I have reviewed the PDMP during this encounter.   Danton Clap, PA-C 03/02/23 [id number]

## 2023-03-02 NOTE — ED Triage Notes (Signed)
Pt noticed rash that started today. Rash starts on left abd and goes around to back. Pt also dx with pneumonia on Friday on the left side. Started on new medication and rash was noticed today.

## 2023-03-02 NOTE — Telephone Encounter (Signed)
  Chief Complaint: Rash on left side of body from navel around to back Symptoms: welts, redness Frequency: this afternoon Pertinent Negatives: Patient denies fever Disposition: [] ED /[x] Urgent Care (no appt availability in office) / [] Appointment(In office/virtual)/ []  [facility]/ [] Home Care/ [] Refused Recommended Disposition /[] [facility]/ []  Follow-up with PCP Additional Notes: PT called very upset regarding newly discovered rash. PT has been sick for awhile with pneumonia. Pt stated that her left side has hurt for awhile. Now there are welts. PT is very upset and wanted to be seen this afternoon at the clinic. Called [facility] - phone went to machine . Left message on machine.  Advise pt to go to UC.    Reason for Disposition  Mild localized rash  Answer Assessment - Initial Assessment Questions 1. APPEARANCE of RASH: "Describe the rash."     Welts 2. LOCATION: "Where is the rash located?"      Left side from naval to her back 3. NUMBER: "How many spots are there?"      Spots around welts 4. SIZE: "How big are the spots?" (Inches, centimeters or compare to size of a coin)      Pencil head some  5. ONSET: "When did the rash start?"      This evening 6. ITCHING: "Does the rash itch?" If Yes, ask: "How bad is the itch?"  (Scale 0-10; or none, mild, moderate, severe)     no 7. PAIN: "Does the rash hurt?" If Yes, ask: "How bad is the pain?"  (Scale 0-10; or none, mild, moderate, severe)    - NONE (0): no pain    - MILD (1-3): doesn't interfere with normal activities     - MODERATE (4-7): interferes with normal activities or awakens from sleep     - SEVERE (8-10): excruciating pain, unable to do any normal activities     Mild moderate 8. OTHER SYMPTOMS: "Do you have any other symptoms?" (e.g., fever)     Welts - side pain  Protocols used: Rash or Redness - Localized-A-AH

## 2023-03-03 ENCOUNTER — Ambulatory Visit (INDEPENDENT_AMBULATORY_CARE_PROVIDER_SITE_OTHER): Payer: No Typology Code available for payment source

## 2023-03-03 ENCOUNTER — Telehealth: Payer: Self-pay

## 2023-03-03 ENCOUNTER — Telehealth: Payer: No Typology Code available for payment source

## 2023-03-03 DIAGNOSIS — E782 Mixed hyperlipidemia: Secondary | ICD-10-CM

## 2023-03-03 DIAGNOSIS — J432 Centrilobular emphysema: Secondary | ICD-10-CM

## 2023-03-03 DIAGNOSIS — R7303 Prediabetes: Secondary | ICD-10-CM

## 2023-03-03 NOTE — Chronic Care Management (AMB) (Signed)
Chronic Care Management   CCM RN Visit Note  03/03/2023 Name: Brandi [name] MRN: [id number] DOB: Jan 15, 1956  Subjective: Brandi [name] is a 67 y.o. year old female who is a primary care patient of Olin Hauser, DO. The patient was referred to the Chronic Care Management team for assistance with care management needs subsequent to provider initiation of CCM services and plan of care.    Today's Visit:  Engaged with patient by telephone for initial visit.     SDOH Interventions Today    Flowsheet Row Most Recent Value  SDOH Interventions   Food Insecurity Interventions Other (Comment), AMB Referral  [The patient is receptive for food resources]  Housing Interventions Intervention Not Indicated  Transportation Interventions Intervention Not Indicated  Utilities Interventions Intervention Not Indicated  Alcohol Usage Interventions Intervention Not Indicated (Score <7)  Financial Strain Interventions Other (Comment)  [referral for food resources]  Physical Activity Interventions Intervention Not Indicated  Stress Interventions Other (Comment)  [The patient is stressed about having pneumonia and shingles right now]  Social Connections Interventions Intervention Not Indicated         Goals Addressed             This Visit's Progress    CCM Expected Outcome:  Monitor, Self-Manage and Reduce Symptoms of  COPD Emphysema       Current Barriers:  Knowledge Deficits related to the need for regular follow up for COPD and other chronic conditions Care Coordination needs related to expressed needs of the patient with COPD and the need for collaboration with the providers in a patient with COPD Chronic Disease Management support and education needs related to effective management of COPD  Planned Interventions: Provided patient with basic written and verbal COPD education on self care/management/and exacerbation prevention Advised patient to track and manage COPD triggers. The  patient currently has pneumonia and shingles. She states that the weather does cause her to have exacerbations with the pollen and changes in the temperatures. Education and support provided.  Provided written and verbal instructions on pursed lip breathing and utilized returned demonstration as teach back Provided instruction about proper use of medications used for management of COPD including inhalers. Has just switched from [facility] to Trelegy and has her inhalers. Uses oxygen at night and when needed Advised patient to self assesses COPD action plan zone and make appointment with provider if in the yellow zone for 48 hours without improvement Advised patient to engage in light exercise as tolerated 3-5 days a week to aid in the the management of COPD Provided education about and advised patient to utilize infection prevention strategies to reduce risk of respiratory infection. Education on taking all medications as directed. She has two more days of antibiotics to take for her pneumonia. Collaboration with the pcp concerning interactions of medications and if there were any related to the new medications she is taking for shingles.  Discussed the importance of adequate rest and management of fatigue with COPD Screening for signs and symptoms of depression related to chronic disease state  Assessed social determinant of health barriers  Symptom Management: Take medications as prescribed   Attend all scheduled provider appointments Call provider office for new concerns or questions  call the Suicide and Crisis Lifeline: 988 call the [place] National Suicide Prevention Lifeline: 505 776 7723 or TTY: 4755231856 TTY (570)484-7077) to talk to a trained counselor call 1-800-273-TALK (toll free, 24 hour hotline) if experiencing a Mental Health or [place]  avoid second hand smoke  eliminate smoking in my home identify and avoid work-related triggers identify and remove indoor air  pollutants limit outdoor activity during cold weather listen for public air quality announcements every day do breathing exercises every day develop a rescue plan eliminate symptom triggers at home follow rescue plan if symptoms flare-up  Follow Up Plan: Telephone follow up appointment with care management team member scheduled for: 03-25-2023 at 0945 am       CCM Expected outcome:  Monitor, Self-Manage and Reduce Symptoms of HLD       Current Barriers:  Knowledge Deficits related to the importance of regular visits to see the provider for effective management of HLD and other chronic conditions Chronic Disease Management support and education needs related to effective management of HLD Lab Results  Component Value Date   CHOL 165 12/22/2021   HDL 63 12/22/2021   LDLCALC 89 12/22/2021   TRIG 64 12/22/2021   CHOLHDL 2.6 12/22/2021    Planned Interventions: Provider established cholesterol goals reviewed; Counseled on importance of regular laboratory monitoring as prescribed; Provided HLD educational materials; Reviewed role and benefits of statin for ASCVD risk reduction; Discussed strategies to manage statin-induced myalgias; Reviewed importance of limiting foods high in cholesterol; Reviewed exercise goals and target of 150 minutes per week; Screening for signs and symptoms of depression related to chronic disease state;  Assessed social determinant of health barriers;   Symptom Management: Take medications as prescribed   Attend all scheduled provider appointments Call provider office for new concerns or questions  call the Suicide and Crisis Lifeline: 988 call the [place] National Suicide Prevention Lifeline: 231-574-5374 or TTY: 409-321-4098 TTY (831) 307-3114) to talk to a trained counselor call 1-800-273-TALK (toll free, 24 hour hotline) if experiencing a Mental Health or [place]  - call for medicine refill 2 or 3 days before it runs out - take all  medications exactly as prescribed - call doctor with any symptoms you believe are related to your medicine - call doctor when you experience any new symptoms - go to all doctor appointments as scheduled  Follow Up Plan: Telephone follow up appointment with care management team member scheduled for: 03-25-2023 at 0945 am        CCM Expected Outcome:  Monitor, Self-Manage and Reduce Symptoms of: Pre Diabetes       Current Barriers:  Knowledge Deficits related to the importance of regular follow up for Chronic Disease Management and Care Coordination Needs and effective management of pre- DM Care Coordination needs related to food resources and educational needs  in a patient with DM Chronic Disease Management support and education needs related to effective management of DM Lab Results  Component Value Date   HGBA1C 5.8 (H) 01/20/2021     Planned Interventions: Provided education to patient about basic DM disease process; Reviewed medications with patient and discussed importance of medication adherence;        Reviewed prescribed diet with patient heart healthy/ADA diet. The patient states sometimes she has issues with her blood sugars getting low; Counseled on importance of regular laboratory monitoring as prescribed;        Discussed plans with patient for ongoing care management follow up and provided patient with direct contact information for care management team;      Provided patient with written educational materials related to hypo and hyperglycemia and importance of correct treatment;       Reviewed scheduled/upcoming provider appointments including: recommended the patient get a follow up with the pcp  for blood work and follow up of chronic conditions. Have sent a message to the admin staff to call the patient to set up an appointment;         call provider for findings outside established parameters;       Referral made to community resources care guide team for assistance with  food resources due to financial strain;      Review of patient status, including review of consultants reports, relevant laboratory and other test results, and medications completed;       Advised patient to discuss changes in her DM, questions, concerns, and the need for new labwork with provider;      Screening for signs and symptoms of depression related to chronic disease state;        Assessed social determinant of health barriers;        The patient currently has shingles. The patient also has pneumonia. Went to urgent care on 03-02-2023 and has medications for shingles.  Symptom Management: Take medications as prescribed   Attend all scheduled provider appointments Call provider office for new concerns or questions  call the Suicide and Crisis Lifeline: 988 call the [place] National Suicide Prevention Lifeline: 780-121-2621 or TTY: 3232907493 TTY 878-309-3089) to talk to a trained counselor call 1-800-273-TALK (toll free, 24 hour hotline) if experiencing a Mental Health or [place]  check feet daily for cuts, sores or redness trim toenails straight across wash and dry feet carefully every day wear comfortable, cotton socks wear comfortable, well-fitting shoes  Follow Up Plan: Telephone follow up appointment with care management team member scheduled for: 03-25-2023 at 0945 am          Plan:Telephone follow up appointment with care management team member scheduled for:  03-25-2023 at [place] am  Noreene Larsson RN, MSN, CCM RN Care Manager  Chronic Care Management Direct Number: [phone]

## 2023-03-03 NOTE — Plan of Care (Signed)
Chronic Care Management Provider Comprehensive Care Plan    03/03/2023 Name: Brandi [name] MRN: [id number] DOB: [birth year]  Referral to Chronic Care Management (CCM) services was placed by Provider:  Dr. Nobie Putnam on Date: 02-26-2023.  Chronic Condition 1: COPD Provider Assessment and Plan Will switch Breztri to Trelegy 200 Patient instructed on proper inhaler use Will need reassessment with PFTs Will need to get her more compensated  hest xray reviewed with patient Given persistent infiltrate, will treat with Levaquin 750 mg x 7 days No indication to repeat steroids at this time Continue Albuterol, Theophylline, Breztri and Daliresp Continue supplemental oxygen as needed Strongly encouraged her to follow-up with her pulmonologist as she may need a chest CT if symptoms continue to persist    Expected Outcome/Goals Addressed This Visit (Provider CCM goals/Provider Assessment and plan    CCM (COPD) EXPECTED OUTCOME: MONITOR, SELF-MANAGE AND REDUCE SYMPTOMS OF COPD    Symptom Management Condition 1: Take all medications as prescribed Attend all scheduled provider appointments Call provider office for new concerns or questions  call the Suicide and Crisis Lifeline: 988 call the [place] National Suicide Prevention Lifeline: (219)624-3341 or TTY: 603-629-3933 TTY 6287029886) to talk to a trained counselor call 1-800-273-TALK (toll free, 24 hour hotline) if experiencing a Mental Health or [place]  eliminate smoking in my home identify and avoid work-related triggers identify and remove indoor air pollutants limit outdoor activity during cold weather listen for public air quality announcements every day develop a rescue plan eliminate symptom triggers at home follow rescue plan if symptoms flare-up  Chronic Condition 2: HLD Provider Assessment and Plan crestor and Zetia Goal LDL less than 70  Coronary artery calcification seen on CT scan -  Having  some chest pain concerning for angina Have ordered Lexiscan Myoview   Former smoker -  Smoked for 31 years, stopped  3 years ago Work-up as above    Expected Outcome/Goals Addressed This Visit (Provider CCM goals/Provider Assessment and plan    CCM (HLD)  EXPECTED OUTCOME:  MONITOR, SELF- MANAGE AND REDUCE SYMPTOMS OF HLD  Symptom Management Condition 2: Take all medications as prescribed Attend all scheduled provider appointments Call provider office for new concerns or questions  call the Suicide and Crisis Lifeline: 988 call the [place] National Suicide Prevention Lifeline: 289-666-7967 or TTY: 734-864-0865 TTY 3857452689) to talk to a trained counselor call 1-800-273-TALK (toll free, 24 hour hotline) if experiencing a Mental Health or [place]  call for medicine refill 2 or 3 days before it runs out take all medications exactly as prescribed call doctor with any symptoms you believe are related to your medicine call doctor when you experience any new symptoms go to all doctor appointments as scheduled  Chronic Condition 3: pre-DM Provider Assessment and Plan Elevated A1c Counseling on lifestyle diet exercise No medication at this time     Expected Outcome/Goals Addressed This Visit (Provider CCM goals/Provider Assessment and plan   CCM (PRE-DIABETES) EXPECTED OUTCOME: MONITOR, SELF-MANAGE AND REDUCE SYMPTOMS OF PRE-DIABETES   Symptom Management Condition 3: Take all medications as prescribed Attend all scheduled provider appointments Call provider office for new concerns or questions  Work with the social worker to address care coordination needs and will continue to work with the clinical team to address health care and disease management related needs check feet daily for cuts, sores or redness trim toenails straight across wash and dry feet carefully every day wear comfortable, cotton socks wear comfortable, well-fitting shoes  Problem  List Patient Active Problem List   Diagnosis Date Noted   Pre-diabetes 04/08/2021   Vitamin D deficiency 04/08/2021   COVID-19 vaccination declined 12/04/2020   Centrilobular emphysema ([name]) 04/08/2020   Nodule of upper lobe of left lung 04/08/2020   Aortic atherosclerosis ([place]) 05/21/2019   Coronary artery calcification seen on CT scan 05/21/2019   Former smoker 05/21/2019   Hyperlipidemia 05/21/2019    Medication Management  Current Outpatient Medications:    albuterol (PROVENTIL) (2.5 MG/3ML) 0.083% nebulizer solution, Take 3 mLs (2.5 mg total) by nebulization every 6 (six) hours as needed for wheezing or shortness of breath., Disp: 75 mL, Rfl: 12   albuterol (VENTOLIN HFA) 108 (90 Base) MCG/ACT inhaler, INHALE 2 PUFFS INTO THE LUNGS EVERY 4 HOURS AS NEEDED FOR WHEEZING OR SHORTNESS OF BREATH, Disp: 8.5 g, Rfl: 2   alendronate (FOSAMAX) 70 MG tablet, TAKE ONE TABLET BY MOUTH EACH WEEK, ON AN EMPTY STOMACH BEFORE BREAKFAST WITH 8oz OF WATER AND REMAIN UPRIGHT FOR :30, Disp: 12 tablet, Rfl: 1   alum & mag hydroxide-simeth (MAALOX/MYLANTA) 200-200-20 MG/5ML suspension, Take 30 mLs by mouth every 4 (four) hours as needed for indigestion or heartburn., Disp: 355 mL, Rfl: 0   aspirin EC 81 MG tablet, Take 1 tablet (81 mg total) by mouth daily., Disp: 90 tablet, Rfl: 3   busPIRone (BUSPAR) 5 MG tablet, TAKE 1 TABLET BY MOUTH TWICE DAILY AS NEEDED FOR ANXIETY, Disp: 60 tablet, Rfl: 2   Calcium Carbonate-Vit D-Min (CALCIUM 1200) 1200-1000 MG-UNIT CHEW, Chew 1,200 mg by mouth daily., Disp: 30 each, Rfl: 11   Cholecalciferol (VITAMIN D3) 125 MCG (5000 UT) TABS, Take by mouth. Vitamin D3 5,000 IU daily for 12 weeks then reduce to OTC Vitamin D3 2,000 IU daily for maintenance, Disp: , Rfl:    ezetimibe (ZETIA) 10 MG tablet, Take 1 tablet (10 mg total) by mouth daily., Disp: 90 tablet, Rfl: 3   famotidine (PEPCID) 20 MG tablet, Take 1 tablet (20 mg total) by mouth 2 (two) times daily. (Patient taking  differently: Take 20 mg by mouth 2 (two) times daily as needed.), Disp: 180 tablet, Rfl: 3   feeding supplement (ENSURE ENLIVE / ENSURE PLUS) LIQD, Take 237 mLs by mouth 2 (two) times daily between meals., Disp: 237 mL, Rfl: 12   Fluticasone-Umeclidin-Vilant (TRELEGY ELLIPTA) 200-62.5-25 MCG/ACT AEPB, Inhale 1 puff into the lungs daily., Disp: 28 each, Rfl: 0   levofloxacin (LEVAQUIN) 750 MG tablet, Take 1 tablet (750 mg total) by mouth daily., Disp: 7 tablet, Rfl: 0   mometasone (ELOCON) 0.1 % lotion, Apply topically., Disp: , Rfl:    montelukast (SINGULAIR) 10 MG tablet, Take 1 tablet (10 mg total) by mouth at bedtime., Disp: 90 tablet, Rfl: 0   Multiple Vitamin (MULTIVITAMIN WITH MINERALS) TABS tablet, Take 1 tablet by mouth daily., Disp: , Rfl:    omeprazole (PRILOSEC) 20 MG capsule, Take 1 capsule (20 mg total) by mouth 2 (two) times daily before a meal., Disp: 180 capsule, Rfl: 3   ondansetron (ZOFRAN-ODT) 4 MG disintegrating tablet, Take 1 tablet (4 mg total) by mouth every 8 (eight) hours as needed for nausea or vomiting., Disp: 30 tablet, Rfl: 0   OXYGEN, Inhale 3 L into the lungs at bedtime as needed., Disp: , Rfl:    roflumilast (DALIRESP) 500 MCG TABS tablet, Start 1 tablet every other day for 1 week then 1 tablet daily. (Patient not taking: Reported on 01/05/2023), Disp: 30 tablet, Rfl: 3  rosuvastatin (CRESTOR) 10 MG tablet, Take 1 tablet (10 mg total) by mouth daily., Disp: 90 tablet, Rfl: 3   sodium chloride (OCEAN) 0.65 % SOLN nasal spray, Place 1 spray into both nostrils as needed for congestion., Disp: , Rfl:    Specialty Vitamins Products (HAIR NOURISHING SUPPLEMENT PO), Take 1 tablet by mouth daily., Disp: , Rfl:    theophylline (THEODUR) 200 MG 12 hr tablet, Take 1 tablet (200 mg total) by mouth 2 (two) times daily., Disp: 60 tablet, Rfl: 1   traMADol (ULTRAM) 50 MG tablet, Take 1 tablet (50 mg total) by mouth every 6 (six) hours as needed for up to 3 days for severe pain., Disp:  9 tablet, Rfl: 0   valACYclovir (VALTREX) 1000 MG tablet, Take 1 tablet (1,000 mg total) by mouth 3 (three) times daily for 7 days., Disp: 21 tablet, Rfl: 0  Cognitive Assessment Identity Confirmed: : Name; DOB Cognitive Status: Normal   Functional Assessment Hearing Difficulty or Deaf: no Wear Glasses or Blind: no Concentrating, Remembering or Making Decisions Difficulty (CP): no Difficulty Communicating: no Difficulty Eating/Swallowing: no Walking or Climbing Stairs Difficulty: yes Mobility Management: gets easily short of breath due to COPD Dressing/Bathing Difficulty: no Doing Errands Independently Difficulty (such as shopping) (CP): no   Caregiver Assessment  Primary Source of Support/Comfort: child(ren) Name of Support/Comfort Primary Source: Earley Favor- daughter People in Home: alone Family Caregiver if Needed: child(ren), adult Family Caregiver Names: Earley Favor- daughter Primary Roles/Responsibilities: retired   Planned Interventions  Provided education to patient about basic DM disease process; Reviewed medications with patient and discussed importance of medication adherence;        Reviewed prescribed diet with patient heart healthy/ADA diet. The patient states sometimes she has issues with her blood sugars getting low; Counseled on importance of regular laboratory monitoring as prescribed;        Discussed plans with patient for ongoing care management follow up and provided patient with direct contact information for care management team;      Provided patient with written educational materials related to hypo and hyperglycemia and importance of correct treatment;       Reviewed scheduled/upcoming provider appointments including: recommended the patient get a follow up with the pcp for blood work and follow up of chronic conditions. Have sent a message to the admin staff to call the patient to set up an appointment;         call provider for findings outside  established parameters;       Referral made to community resources care guide team for assistance with food resources due to financial strain;      Review of patient status, including review of consultants reports, relevant laboratory and other test results, and medications completed;       Advised patient to discuss changes in her DM, questions, concerns, and the need for new labwork with provider;      Screening for signs and symptoms of depression related to chronic disease state;        Assessed social determinant of health barriers;        The patient currently has shingles. The patient also has pneumonia. Went to urgent care on 03-02-2023 and has medications for shingles. Provider established cholesterol goals reviewed; Counseled on importance of regular laboratory monitoring as prescribed; Provided HLD educational materials; Reviewed role and benefits of statin for ASCVD risk reduction; Discussed strategies to manage statin-induced myalgias; Reviewed importance of limiting foods high in cholesterol; Reviewed exercise goals  and target of 150 minutes per week; Screening for signs and symptoms of depression related to chronic disease state;  Assessed social determinant of health barriers;  Provided patient with basic written and verbal COPD education on self care/management/and exacerbation prevention Advised patient to track and manage COPD triggers. The patient currently has pneumonia and shingles. She states that the weather does cause her to have exacerbations with the pollen and changes in the temperatures. Education and support provided.  Provided written and verbal instructions on pursed lip breathing and utilized returned demonstration as teach back Provided instruction about proper use of medications used for management of COPD including inhalers. Has just switched from [facility] to Trelegy and has her inhalers. Uses oxygen at night and when needed Advised patient to self assesses COPD  action plan zone and make appointment with provider if in the yellow zone for 48 hours without improvement Advised patient to engage in light exercise as tolerated 3-5 days a week to aid in the the management of COPD Provided education about and advised patient to utilize infection prevention strategies to reduce risk of respiratory infection. Education on taking all medications as directed. She has two more days of antibiotics to take for her pneumonia. Collaboration with the pcp concerning interactions of medications and if there were any related to the new medications she is taking for shingles.  Discussed the importance of adequate rest and management of fatigue with COPD Screening for signs and symptoms of depression related to chronic disease state  Assessed social determinant of health barriers       Interaction and coordination with outside resources, practitioners, and providers See CCM Referral  Care Plan: Available in MyChart

## 2023-03-03 NOTE — Telephone Encounter (Signed)
   Telephone encounter was:  Successful.  03/03/2023 Name: Brandi [name] MRN: [id number] DOB: 1956-10-29  Brandi [name] is a 67 y.o. year old female who is a primary care patient of Olin Hauser, DO . The community resource team was consulted for assistance with [place] guide performed the following interventions: Patient provided with information about care guide support team and interviewed to confirm resource needs.Patient has no needs for food at this time   Follow Up Plan:  No further follow up planned at this time. The patient has been provided with needed resources.    [facility] [phone] [address], [place], [place] [address] Phone: [phone] Email: [email or web address]

## 2023-03-03 NOTE — Patient Instructions (Addendum)
Please call the care guide team at [phone] if you need to cancel or reschedule your appointment.   If you are experiencing a Mental Health or [place] or need someone to talk to, please call the Suicide and Crisis Lifeline: 988 call the [place] National Suicide Prevention Lifeline: 951-470-5006 or TTY: 563-487-7214 TTY 684-232-5456) to talk to a trained counselor call 1-800-273-TALK (toll free, 24 hour hotline)   Following is a copy of the CCM Program Consent:  CCM service includes personalized support from designated clinical staff supervised by the physician, including individualized plan of care and coordination with other care providers 24/7 contact phone numbers for assistance for urgent and routine care needs. Service will only be billed when office clinical staff spend 20 minutes or more in a month to coordinate care. Only one practitioner may furnish and bill the service in a calendar month. The patient may stop CCM services at amy time (effective at the end of the month) by phone call to the office staff. The patient will be responsible for cost sharing (co-pay) or up to 20% of the service fee (after annual deductible is met)  Following is a copy of your full provider care plan:   Goals Addressed             This Visit's Progress    CCM Expected Outcome:  Monitor, Self-Manage and Reduce Symptoms of  COPD Emphysema       Current Barriers:  Knowledge Deficits related to the need for regular follow up for COPD and other chronic conditions Care Coordination needs related to expressed needs of the patient with COPD and the need for collaboration with the providers in a patient with COPD Chronic Disease Management support and education needs related to effective management of COPD  Planned Interventions: Provided patient with basic written and verbal COPD education on self care/management/and exacerbation prevention Advised patient to track and manage COPD  triggers. The patient currently has pneumonia and shingles. She states that the weather does cause her to have exacerbations with the pollen and changes in the temperatures. Education and support provided.  Provided written and verbal instructions on pursed lip breathing and utilized returned demonstration as teach back Provided instruction about proper use of medications used for management of COPD including inhalers. Has just switched from [facility] to Trelegy and has her inhalers. Uses oxygen at night and when needed Advised patient to self assesses COPD action plan zone and make appointment with provider if in the yellow zone for 48 hours without improvement Advised patient to engage in light exercise as tolerated 3-5 days a week to aid in the the management of COPD Provided education about and advised patient to utilize infection prevention strategies to reduce risk of respiratory infection. Education on taking all medications as directed. She has two more days of antibiotics to take for her pneumonia. Collaboration with the pcp concerning interactions of medications and if there were any related to the new medications she is taking for shingles.  Discussed the importance of adequate rest and management of fatigue with COPD Screening for signs and symptoms of depression related to chronic disease state  Assessed social determinant of health barriers  Symptom Management: Take medications as prescribed   Attend all scheduled provider appointments Call provider office for new concerns or questions  call the Suicide and Crisis Lifeline: 988 call the [place] National Suicide Prevention Lifeline: 847-378-2570 or TTY: 339-732-6049 TTY 4840076315) to talk to a trained counselor call 1-800-273-TALK (toll free, 24  hour hotline) if experiencing a Mental Health or [place]  avoid second hand smoke eliminate smoking in my home identify and avoid work-related triggers identify and remove  indoor air pollutants limit outdoor activity during cold weather listen for public air quality announcements every day do breathing exercises every day develop a rescue plan eliminate symptom triggers at home follow rescue plan if symptoms flare-up  Follow Up Plan: Telephone follow up appointment with care management team member scheduled for: 03-25-2023 at 0945 am       CCM Expected outcome:  Monitor, Self-Manage and Reduce Symptoms of HLD       Current Barriers:  Knowledge Deficits related to the importance of regular visits to see the provider for effective management of HLD and other chronic conditions Chronic Disease Management support and education needs related to effective management of HLD Lab Results  Component Value Date   CHOL 165 12/22/2021   HDL 63 12/22/2021   LDLCALC 89 12/22/2021   TRIG 64 12/22/2021   CHOLHDL 2.6 12/22/2021    Planned Interventions: Provider established cholesterol goals reviewed; Counseled on importance of regular laboratory monitoring as prescribed; Provided HLD educational materials; Reviewed role and benefits of statin for ASCVD risk reduction; Discussed strategies to manage statin-induced myalgias; Reviewed importance of limiting foods high in cholesterol; Reviewed exercise goals and target of 150 minutes per week; Screening for signs and symptoms of depression related to chronic disease state;  Assessed social determinant of health barriers;   Symptom Management: Take medications as prescribed   Attend all scheduled provider appointments Call provider office for new concerns or questions  call the Suicide and Crisis Lifeline: 988 call the [place] National Suicide Prevention Lifeline: 717-577-8430 or TTY: 930-102-0939 TTY 909-367-9282) to talk to a trained counselor call 1-800-273-TALK (toll free, 24 hour hotline) if experiencing a Mental Health or [place]  - call for medicine refill 2 or 3 days before it runs out -  take all medications exactly as prescribed - call doctor with any symptoms you believe are related to your medicine - call doctor when you experience any new symptoms - go to all doctor appointments as scheduled  Follow Up Plan: Telephone follow up appointment with care management team member scheduled for: 03-25-2023 at 0945 am        CCM Expected Outcome:  Monitor, Self-Manage and Reduce Symptoms of: Pre Diabetes       Current Barriers:  Knowledge Deficits related to the importance of regular follow up for Chronic Disease Management and Care Coordination Needs and effective management of pre- DM Care Coordination needs related to food resources and educational needs  in a patient with DM Chronic Disease Management support and education needs related to effective management of DM Lab Results  Component Value Date   HGBA1C 5.8 (H) 01/20/2021     Planned Interventions: Provided education to patient about basic DM disease process; Reviewed medications with patient and discussed importance of medication adherence;        Reviewed prescribed diet with patient heart healthy/ADA diet. The patient states sometimes she has issues with her blood sugars getting low; Counseled on importance of regular laboratory monitoring as prescribed;        Discussed plans with patient for ongoing care management follow up and provided patient with direct contact information for care management team;      Provided patient with written educational materials related to hypo and hyperglycemia and importance of correct treatment;  Reviewed scheduled/upcoming provider appointments including: recommended the patient get a follow up with the pcp for blood work and follow up of chronic conditions. Have sent a message to the admin staff to call the patient to set up an appointment;         call provider for findings outside established parameters;       Referral made to community resources care guide team for  assistance with food resources due to financial strain;      Review of patient status, including review of consultants reports, relevant laboratory and other test results, and medications completed;       Advised patient to discuss changes in her DM, questions, concerns, and the need for new labwork with provider;      Screening for signs and symptoms of depression related to chronic disease state;        Assessed social determinant of health barriers;        The patient currently has shingles. The patient also has pneumonia. Went to urgent care on 03-02-2023 and has medications for shingles.  Symptom Management: Take medications as prescribed   Attend all scheduled provider appointments Call provider office for new concerns or questions  call the Suicide and Crisis Lifeline: 988 call the [place] National Suicide Prevention Lifeline: 647-561-0390 or TTY: (340)672-5550 TTY (780)071-6502) to talk to a trained counselor call 1-800-273-TALK (toll free, 24 hour hotline) if experiencing a Mental Health or [place]  check feet daily for cuts, sores or redness trim toenails straight across wash and dry feet carefully every day wear comfortable, cotton socks wear comfortable, well-fitting shoes  Follow Up Plan: Telephone follow up appointment with care management team member scheduled for: 03-25-2023 at 0945 am          Patient verbalizes understanding of instructions and care plan provided today and agrees to view in [place]. Active MyChart status and patient understanding of how to access instructions and care plan via MyChart confirmed with patient.     Telephone follow up appointment with care management team member scheduled for: 03-25-2023 at 0945 am   Recombinant Zoster (Shingles) Vaccine: What You Need to Know 1. Why get vaccinated? Recombinant zoster (shingles) vaccine can prevent shingles. Shingles (also called herpes zoster, or just zoster) is a painful skin rash,  usually with blisters. In addition to the rash, shingles can cause fever, headache, chills, or upset stomach. Rarely, shingles can lead to complications such as pneumonia, hearing problems, blindness, brain inflammation (encephalitis), or death. The risk of shingles increases with age. The most common complication of shingles is long-term nerve pain called postherpetic neuralgia (PHN). PHN occurs in the areas where the shingles rash was and can last for months or years after the rash goes away. The pain from PHN can be severe and debilitating. The risk of PHN increases with age. An older adult with shingles is more likely to develop PHN and have longer lasting and more severe pain than a younger person. People with weakened immune systems also have a higher risk of getting shingles and complications from the disease. Shingles is caused by varicella-zoster virus, the same virus that causes chickenpox. After you have chickenpox, the virus stays in your body and can cause shingles later in life. Shingles cannot be passed from one person to another, but the virus that causes shingles can spread and cause chickenpox in someone who has never had chickenpox or has never received chickenpox vaccine. 2. Recombinant shingles vaccine Recombinant shingles vaccine provides  strong protection against shingles. By preventing shingles, recombinant shingles vaccine also protects against PHN and other complications. Recombinant shingles vaccine is recommended for: Adults 10 years and older Adults 19 years and older who have a weakened immune system because of disease or treatments Shingles vaccine is given as a two-dose series. For most people, the second dose should be given 2 to 6 months after the first dose. Some people who have or will have a weakened immune system can get the second dose 1 to 2 months after the first dose. Ask your health care provider for guidance. People who have had shingles in the past and people  who have received varicella (chickenpox) vaccine are recommended to get recombinant shingles vaccine. The vaccine is also recommended for people who have already gotten another type of shingles vaccine, the live shingles vaccine. There is no live virus in recombinant shingles vaccine. Shingles vaccine may be given at the same time as other vaccines. 3. Talk with your health care provider Tell your vaccination provider if the person getting the vaccine: Has had an allergic reaction after a previous dose of recombinant shingles vaccine, or has any severe, life-threatening allergies Is currently experiencing an episode of shingles Is pregnant In some cases, your health care provider may decide to postpone shingles vaccination until a future visit. People with minor illnesses, such as a cold, may be vaccinated. People who are moderately or severely ill should usually wait until they recover before getting recombinant shingles vaccine. Your health care provider can give you more information. 4. Risks of a vaccine reaction A sore arm with mild or moderate pain is very common after recombinant shingles vaccine. Redness and swelling can also happen at the site of the injection. Tiredness, muscle pain, headache, shivering, fever, stomach pain, and nausea are common after recombinant shingles vaccine. These side effects may temporarily prevent a vaccinated person from doing regular activities. Symptoms usually go away on their own in 2 to 3 days. You should still get the second dose of recombinant shingles vaccine even if you had one of these reactions after the first dose. Guillain-Barr syndrome (GBS), a serious nervous system disorder, has been reported very rarely after recombinant zoster vaccine. People sometimes faint after medical procedures, including vaccination. Tell your provider if you feel dizzy or have vision changes or ringing in the ears. As with any medicine, there is a very remote chance of a  vaccine causing a severe allergic reaction, other serious injury, or death. 5. What if there is a serious problem? An allergic reaction could occur after the vaccinated person leaves the clinic. If you see signs of a severe allergic reaction (hives, swelling of the face and throat, difficulty breathing, a fast heartbeat, dizziness, or weakness), call 9-1-1 and get the person to the nearest hospital. For other signs that concern you, call your health care provider. Adverse reactions should be reported to the Vaccine Adverse Event Reporting System (VAERS). Your health care provider will usually file this report, or you can do it yourself. Visit the VAERS website at www.vaers.SamedayNews.es or call 4311121624. VAERS is only for reporting reactions, and VAERS staff members do not give medical advice. 6. How can I learn more? Ask your health care provider. Call your local or state health department. Visit the website of the Food and Drug Administration (FDA) for vaccine package inserts and additional information at http://lopez-wang.org/. Contact the Centers for Disease Control and Prevention (CDC): Call 905-210-7190 (1-800-CDC-INFO) or Visit CDC's website at http://hunter.com/. Source:  CDC Vaccine Information Statement Recombinant Zoster Vaccine (01/10/2021) This same material is available at http://www.wolf.info/ for no charge. This information is not intended to replace advice given to you by your health care provider. Make sure you discuss any questions you have with your health care provider. Document Revised: 09/02/2022 Document Reviewed: 09/02/2022 Elsevier Patient Education  [place] is an infection. It gives you a painful skin rash and blisters that have fluid in them. Shingles is caused by the same germ (virus) that causes chickenpox. Shingles only happens in people who: Have had chickenpox. Have been given a shot (vaccine) to protect against  chickenpox. Shingles is rare in this group. What are the causes? This condition is caused by varicella-zoster virus. This is the same germ that causes chickenpox. After a person is exposed to the germ, the germ stays in the body but is not active (dormant). Shingles develops if the germ becomes active again (is reactivated). This can happen many years after the first exposure to the germ. It is not known what causes this germ to become active again. What increases the risk? People who have had chickenpox or received the chickenpox shot are at risk for shingles. This infection is more common in people who: Are older than 68 years of age. Have a weakened disease-fighting system (immune system), such as people with: HIV (human immunodeficiency virus). AIDS (acquired immunodeficiency syndrome). Cancer. Are taking medicines that weaken the immune system, such as organ transplant medicines. Have a lot of stress. What are the signs or symptoms? The first symptoms of shingles may be itching, tingling, or pain in an area on your skin. A rash will show on your skin a few days or weeks later. This is what usually happens: The rash is likely to be on one side of your body. The rash usually has a shape like a belt or a band. Over time, the rash turns into fluid-filled blisters. The blisters will break open and change into scabs. The scabs usually dry up in about 2-3 weeks. You may also have: A fever. Chills. A headache. A feeling like you may vomit (nausea). How is this treated? The rash may last for several weeks. There is not a specific cure for this condition. Your doctor may prescribe medicines. Medicines may: Help with pain. Help you get better sooner. Help to prevent long-term problems. Help with itching (antihistamines). If the area involved is on your face, you may need to see a specialist. This may be an eye doctor or an ear, nose, and throat (ENT) doctor. Follow these instructions at  home: Medicines Take over-the-counter and prescription medicines only as told by your doctor. Put on an anti-itch cream or numbing cream where you have a rash, blisters, or scabs. Do this as told by your doctor. Helping with itching and discomfort  Put cold, wet cloths (cold compresses) on the area of the rash or blisters as told by your doctor. Cool baths can help you feel better. Try adding baking soda or dry oatmeal to the water to lessen itching. Do not bathe in hot water. Use calamine lotion as told by your doctor. Blister and rash care Keep your rash covered with a loose bandage (dressing). Wear loose clothing that does not rub on your rash. Wash your hands with soap and water for at least 20 seconds before and after you change your bandage. If you cannot use soap and water, use hand sanitizer. Change your bandage as told by your  doctor. Keep your rash and blisters clean. To do this, wash the area with mild soap and cool water as told by your doctor. Check your rash every day for signs of infection. Check for: More redness, swelling, or pain. Fluid or blood. Warmth. Pus or a bad smell. Do not scratch your rash. Do not pick at your blisters. To help you to not scratch: Keep your fingernails clean and cut short. Wear gloves or mittens when you sleep, if scratching is a problem. General instructions Rest as told by your doctor. Wash your hands often with soap and water for at least 20 seconds. If you cannot use soap and water, use hand sanitizer. Doing this lowers your chance of getting a skin infection. Your infection can cause chickenpox in people who have never had chickenpox or never got a chickenpox vaccine shot. If you have blisters that did not change into scabs yet, try not to touch other people or be around other people, especially: Babies. Pregnant women. Children who have areas of red, itchy, or rough skin (eczema). Older people who have organ transplants. People who have  a long-term (chronic) illness, like cancer or AIDS. Keep all follow-up visits. How is this prevented? A vaccine shot is the best way to prevent shingles and protect against shingles problems. If you have not had a vaccine shot, talk with your doctor about getting it. Where to find more information Centers for Disease Control and Prevention: http://www.wolf.info/ Contact a doctor if: Your pain does not get better with medicine. Your pain does not get better after the rash heals. You have any of these signs of infection around the rash: More redness, swelling, or pain. Fluid or blood. Warmth. Pus or a bad smell. You have a fever. Get help right away if: The rash is on your face or nose. You have pain in your face or pain by your eye. You lose feeling on one side of your face. You have trouble seeing. You have ear pain, or you have ringing in your ear. You have a loss of taste. Your condition gets worse. Summary Shingles gives you a painful skin rash and blisters that have fluid in them. Shingles is caused by the same germ (virus) that causes chickenpox. Keep your rash covered with a loose bandage. Wear loose clothing that does not rub on your rash. If you have blisters that did not change into scabs yet, try not to touch other people or be around people. This information is not intended to replace advice given to you by your health care provider. Make sure you discuss any questions you have with your health care provider. Document Revised: 11/18/2020 Document Reviewed: 11/18/2020 Elsevier Patient Education  [place].

## 2023-03-03 NOTE — Telephone Encounter (Signed)
FYI

## 2023-03-03 NOTE — Progress Notes (Signed)
  Chronic Care Management   Note  03/03/2023 Name: Brandi [name] MRN: [id number] DOB: 03/10/1956  Brandi [name] is a 67 y.o. year old female who is a primary care patient of Olin Hauser, DO. I reached out to [employer] by phone today in response to a referral sent by Brandi [name]'s PCP.  Brandi [name] was given information about Chronic Care Management services today including:  CCM service includes personalized support from designated clinical staff supervised by the physician, including individualized plan of care and coordination with other care providers 24/7 contact phone numbers for assistance for urgent and routine care needs. Service will only be billed when office clinical staff spend 20 minutes or more in a month to coordinate care. Only one practitioner may furnish and bill the service in a calendar month. The patient may stop CCM services at amy time (effective at the end of the month) by phone call to the office staff. The patient will be responsible for cost sharing (co-pay) or up to 20% of the service fee (after annual deductible is met)  Brandi [name]  agreedto scheduling an appointment with the CCM RN Case Manager   Follow up plan: Patient agreed to scheduled appointment with RN Case Manager on 03/03/2023(date/time).   Brandi [name], [place], [place] [address] Direct Dial: [phone] [name]

## 2023-03-07 DIAGNOSIS — J449 Chronic obstructive pulmonary disease, unspecified: Secondary | ICD-10-CM

## 2023-03-07 DIAGNOSIS — E785 Hyperlipidemia, unspecified: Secondary | ICD-10-CM | POA: Diagnosis not present

## 2023-03-08 ENCOUNTER — Other Ambulatory Visit: Payer: No Typology Code available for payment source

## 2023-03-10 ENCOUNTER — Ambulatory Visit: Payer: No Typology Code available for payment source | Admitting: Family Medicine

## 2023-03-10 ENCOUNTER — Encounter: Payer: Self-pay | Admitting: Family Medicine

## 2023-03-10 VITALS — BP 112/60 | HR 88 | Temp 96.6°F | Wt 119.0 lb

## 2023-03-10 DIAGNOSIS — K219 Gastro-esophageal reflux disease without esophagitis: Secondary | ICD-10-CM

## 2023-03-10 DIAGNOSIS — B029 Zoster without complications: Secondary | ICD-10-CM

## 2023-03-10 MED ORDER — TRAMADOL HCL 50 MG PO TABS: 50.0000 mg | ORAL_TABLET | Freq: Three times a day (TID) | ORAL | 0 refills | Status: AC | PRN

## 2023-03-10 MED ORDER — FAMOTIDINE 20 MG PO TABS: 20.0000 mg | ORAL_TABLET | Freq: Two times a day (BID) | ORAL | 1 refills | Status: DC | PRN

## 2023-03-10 MED ORDER — GABAPENTIN 100 MG PO CAPS: ORAL_CAPSULE | ORAL | 1 refills | Status: DC

## 2023-03-10 NOTE — Patient Instructions (Addendum)
Thank you for coming to the office today.  No new antibiotics Keep up with CT Scan upcoming  For shingles, re ordered the medication.  Re order Tramadol for pain pill.  Gabapentin, see instructions below  -------------------  Finish the valtrex.  Blister and Rash Care Take a cool bath or apply cool compresses to the area of the rash or blisters as directed by your health care provider. This may help with pain and itching. Keep your rash covered with a loose bandage (dressing). Wear loose-fitting clothing to help ease the pain of material rubbing against the rash. Keep your rash and blisters clean with mild soap and cool water or as directed by your health care provider. Check your rash every day for signs of infection. These include redness, swelling, and pain that lasts or increases. Do not pick your blisters. Do not scratch your rash.  As mentioned, some patients experience "Post-Herpetic Neuralgia" or a persistent sensation or feeling of "nerve irritation or pain" that can last for days to weeks to months or longer in this same area.  You may try the nerve medicine to help ease these symptoms now or in the future if it is needed. Try Gabapentin 100mg  capsules, take at night for 2-3 nights only, and then increase to 2 times a day for a few days, and then may increase to 3 times a day, it may make you drowsy, if helps significantly at night only, then you can increase instead to 3 capsules at night, instead of 3 times a day - In the future if needed, we can significantly increase the dose if tolerated well, some common doses are 300mg  three times a day up to 600mg  three times a day, usually it takes several weeks or months to get to higher doses  It is contagious to patients who have never had Chicken Pox - or someone who is pregnant (unborn baby is at risk) - or elderly with weakened immune system. Try to avoid direct close contact with others on the skin if you have active blisters.  Once the rash dries up and heals, it is less of a concern.  You may get the Shingles vaccine (Shingrix) approximately 6 weeks after the rash and episode is resolved. It is a two dose series of vaccines.  You may get this at the pharmacy, as Medicare does not cover this vaccine at our office.      ------------------------------------------------------------------------------------------------------- Please review the additional information below about Shingles, if you have additional concerns.  Shingles Shingles, which is also known as herpes zoster, is an infection that causes a painful skin rash and fluid-filled blisters. Shingles is not related to genital herpes, which is a sexually transmitted infection. Shingles only develops in people who: Have had chickenpox. Have received the chickenpox vaccine. (This is rare.)  What are the causes? Shingles is caused by varicella-zoster virus (VZV). This is the same virus that causes chickenpox. After exposure to VZV, the virus stays in the body in an inactive (dormant) state. Shingles develops if the virus reactivates. This can happen many years after the initial exposure to VZV. It is not known what causes this virus to reactivate. What increases the risk? People who have had chickenpox or received the chickenpox vaccine are at risk for shingles. Infection is more common in people who: Are older than age 57. Have a weakened defense (immune) system, such as those with HIV, AIDS, or cancer. Are taking medicines that weaken the immune system, such as transplant  medicines. Are under great stress.  What are the signs or symptoms? Early symptoms of this condition include itching, tingling, and pain in an area on your skin. Pain may be described as burning, stabbing, or throbbing. A few days or weeks after symptoms start, a painful red rash appears, usually on one side of the body in a bandlike or beltlike pattern. The rash eventually turns into  fluid-filled blisters that break open, scab over, and dry up in about 2-3 weeks. At any time during the infection, you may also develop: A fever. Chills. A headache. An upset stomach.  How is this diagnosed? This condition is diagnosed with a skin exam. Sometimes, skin or fluid samples are taken from the blisters before a diagnosis is made. These samples are examined under a microscope or sent to a lab for testing. How is this treated? There is no specific cure for this condition. Your health care provider will probably prescribe medicines to help you manage pain, recover more quickly, and avoid long-term problems. Medicines may include: Antiviral drugs. Anti-inflammatory drugs. Pain medicines.  If the area involved is on your face, you may be referred to a specialist, such as an eye doctor (ophthalmologist) or an ear, nose, and throat (ENT) doctor to help you avoid eye problems, chronic pain, or disability. Follow these instructions at home: Medicines Take medicines only as directed by your health care provider. Apply an anti-itch or numbing cream to the affected area as directed by your health care provider. General instructions Rest as directed by your health care provider. Keep all follow-up visits as directed by your health care provider. This is important. Until your blisters scab over, your infection can cause chickenpox in people who have never had it or been vaccinated against it. To prevent this from happening, avoid contact with other people, especially: Babies. Pregnant women. Children who have eczema. Elderly people who have transplants. People who have chronic illnesses, such as leukemia or AIDS. This information is not intended to replace advice given to you by your health care provider. Make sure you discuss any questions you have with your health care provider. Document Released: 11/23/2005 Document Revised: 07/19/2016 Document Reviewed: 10/04/2014 Elsevier Interactive  Patient Education  2018 [employer].    Please schedule a Follow-up Appointment to: Return if symptoms worsen or fail to improve.  If you have any other questions or concerns, please feel free to call the office or send a message through [facility]. You may also schedule an earlier appointment if necessary.  Additionally, you may be receiving a survey about your experience at our office within a few days to 1 week by e-mail or mail. We value your feedback.  Nobie Putnam, DO [facility]

## 2023-03-10 NOTE — Progress Notes (Signed)
Subjective:    Patient ID: Jionni Kolm, female    DOB: 02-27-1956, 67 y.o.   MRN: [id number]  Alleena Gandolfo is a 67 y.o. female presenting on 03/10/2023 for Pneumonia and Herpes Zoster  Patient presents for a same day appointment. Work in apt.  HPI  ED FOLLOW-UP VISIT  Hospital/Location: [facility] Date of ED Visit: 03/02/23  Reason for Presenting to ED: Shingles  FOLLOW-UP  - ED provider note and record have been reviewed - Patient presents today about 8 days after recent ED visit. Brief summary of recent course, patient had symptoms of shingles rash, presented to ED on 3/26  Recently diagnosed with pneumonia 02/26/23 Still productive cough with green sputum Seen [facility]ina 3/22, levaquin 750 daily x 7 days Pulm saw her 3/25 - On Trelegy doing better, instead of breztri  - Today reports overall has done well after discharge from ED. Symptoms of shingles rash has improved still present but drying up and healing. Neuropathic pain still present.  - New medications on discharge: Valtrex Tramadol   I have reviewed the discharge medication list, and have reconciled the current and discharge medications today.       03/03/2023    3:24 PM 08/04/2022    1:15 PM 02/12/2022   10:42 AM  Depression screen PHQ 2/9  Decreased Interest 1 1 0  Down, Depressed, Hopeless 0 3 0  PHQ - 2 Score 1 4 0  Altered sleeping  0 0  Tired, decreased energy  0 0  Change in appetite  1 0  Feeling bad or failure about yourself   0 0  Trouble concentrating  1 0  Moving slowly or fidgety/restless  0 0  Suicidal thoughts  0 0  PHQ-9 Score  6 0  Difficult doing work/chores  Somewhat difficult Not difficult at all    Social History   Tobacco Use   Smoking status: Former    Packs/day: 0.75    Years: 41.00    Additional pack years: 0.00    Total pack years: 30.75    Types: Cigarettes    Quit date: 12/08/2015    Years since quitting: 7.2   Smokeless tobacco: Never  Vaping Use   Vaping Use: Never used   Substance Use Topics   Alcohol use: No   Drug use: No    Review of Systems Per HPI unless specifically indicated above     Objective:    BP 112/60 (BP Location: Left Arm, Patient Position: Sitting, Cuff Size: Normal)   Pulse 88   Temp (!) 96.6 F (35.9 C) (Temporal)   Wt 119 lb (54 kg)   SpO2 96%   BMI 20.43 kg/m   Wt Readings from Last 3 Encounters:  03/10/23 119 lb (54 kg)  03/01/23 122 lb (55.3 kg)  02/26/23 127 lb (57.6 kg)    Physical Exam Vitals and nursing note reviewed.  Constitutional:      General: She is not in acute distress.    Appearance: Normal appearance. She is well-developed. She is not diaphoretic.     Comments: Well-appearing, comfortable, cooperative  HENT:     Head: Normocephalic and atraumatic.  Eyes:     General:        Right eye: No discharge.        Left eye: No discharge.     Conjunctiva/sclera: Conjunctivae normal.  Neck:     Thyroid: No thyromegaly.  Cardiovascular:     Rate and Rhythm: Normal rate and regular rhythm.  Heart sounds: Normal heart sounds. No murmur heard. Pulmonary:     Effort: Pulmonary effort is normal. No respiratory distress.     Breath sounds: Normal breath sounds. No wheezing or rales.  Musculoskeletal:        General: Normal range of motion.     Cervical back: Normal range of motion and neck supple.  Lymphadenopathy:     Cervical: No cervical adenopathy.  Skin:    General: Skin is warm and dry.     Findings: Rash (Left sided anterior chest wall wrapping across flank to back does not cross midline, healing residual shingles vesicular rash.) present. No erythema.  Neurological:     Mental Status: She is alert and oriented to person, place, and time.  Psychiatric:        Mood and Affect: Mood normal.        Behavior: Behavior normal.        Thought Content: Thought content normal.     Comments: Well groomed, good eye contact, normal speech and thoughts     I have personally reviewed the radiology  report from 02/23/23 CXR.  CLINICAL DATA:  67 year old female with shortness of breath   EXAM: CHEST - 2 VIEW   COMPARISON:  01/05/2023, 12/04/2021, CT chest 04/09/2021   FINDINGS: Cardiomediastinal silhouette unchanged in size and contour. No evidence of central vascular congestion. No interlobular septal thickening.   Stigmata of emphysema, with increased retrosternal airspace, flattened hemidiaphragms, increased AP diameter, and hyperinflation on the AP view.   Near complete resolution of the mixed interstitial and airspace opacity of the right lung.   Persisting opacity in the distribution of the middle lobe on the lateral view.   No new airspace disease.   No pneumothorax or pleural effusion. Coarsened interstitial markings.   No acute displaced fracture. Degenerative changes of the spine.   IMPRESSION: Improved though incompletely resolved middle lobe infection.   Emphysema     Electronically Signed   By: Corrie Mckusick D.O.   On: 02/26/2023 09:38  Results for orders placed or performed during the hospital encounter of 03/01/23  Theophylline level  Result Value Ref Range   Theophylline Lvl <0.8 (L) 10.0 - 20.0 ug/mL  D-Dimer, Quantitative  Result Value Ref Range   D-Dimer, Quant <0.27 0.00 - 0.50 ug/mL-FEU  Renal Function Panel  Result Value Ref Range   Sodium 138 135 - 145 mmol/L   Potassium 3.7 3.5 - 5.1 mmol/L   Chloride 102 98 - 111 mmol/L   CO2 28 22 - 32 mmol/L   Glucose, Bld 118 (H) 70 - 99 mg/dL   BUN 12 8 - 23 mg/dL   Creatinine, Ser 0.61 0.44 - 1.00 mg/dL   Calcium 9.2 8.9 - 10.3 mg/dL   Phosphorus 3.2 2.5 - 4.6 mg/dL   Albumin 4.1 3.5 - 5.0 g/dL   GFR, Estimated >60 >60 mL/min   Anion gap 8 5 - 15  CBC w/Diff  Result Value Ref Range   WBC 7.5 4.0 - 10.5 K/uL   RBC 4.31 3.87 - 5.11 MIL/uL   Hemoglobin 13.8 12.0 - 15.0 g/dL   HCT 42.5 36.0 - 46.0 %   MCV 98.6 80.0 - 100.0 fL   MCH 32.0 26.0 - 34.0 pg   MCHC 32.5 30.0 - 36.0 g/dL    RDW 12.5 11.5 - 15.5 %   Platelets 288 150 - 400 K/uL   nRBC 0.0 0.0 - 0.2 %   Neutrophils Relative % 70 %  Neutro Abs 5.2 1.7 - 7.7 K/uL   Lymphocytes Relative 14 %   Lymphs Abs 1.1 0.7 - 4.0 K/uL   Monocytes Relative 10 %   Monocytes Absolute 0.8 0.1 - 1.0 K/uL   Eosinophils Relative 4 %   Eosinophils Absolute 0.3 0.0 - 0.5 K/uL   Basophils Relative 1 %   Basophils Absolute 0.1 0.0 - 0.1 K/uL   Immature Granulocytes 1 %   Abs Immature Granulocytes 0.06 0.00 - 0.07 K/uL      Assessment & Plan:   Problem List Items Addressed This Visit   None Visit Diagnoses     Herpes zoster without complication    -  Primary   Relevant Medications   gabapentin (NEURONTIN) 100 MG capsule   traMADol (ULTRAM) 50 MG tablet   Gastroesophageal reflux disease without esophagitis       Relevant Medications   famotidine (PEPCID) 20 MG tablet       Shingles flare Within 1 week Healing rash Persistent post herpetic neuralgia pain Re order Tramadol AS NEEDED use Order Gabapentin for nerve pain  CAP, pneumonia Improved, finished antibiotics On prednisone / trelegy per Pulm Keep up with upcoming CT imaging, instead of repeat X-ray. No acute sign of pneumonia today  GERD Re order Pepcid   Meds ordered this encounter  Medications   famotidine (PEPCID) 20 MG tablet    Sig: Take 1 tablet (20 mg total) by mouth 2 (two) times daily as needed.    Dispense:  180 tablet    Refill:  1   gabapentin (NEURONTIN) 100 MG capsule    Sig: Start 1 capsule daily, increase by 1 cap every 2-3 days as tolerated up to 3 times a day, or may take 3 at once in evening.    Dispense:  90 capsule    Refill:  1   traMADol (ULTRAM) 50 MG tablet    Sig: Take 1 tablet (50 mg total) by mouth every 8 (eight) hours as needed for up to 7 days.    Dispense:  21 tablet    Refill:  0      Follow up plan: Return if symptoms worsen or fail to improve.  Nobie Putnam, [facility] 03/10/2023, 4:03 PM

## 2023-03-16 ENCOUNTER — Encounter: Payer: Self-pay | Admitting: Family Medicine

## 2023-03-18 ENCOUNTER — Telehealth: Payer: Self-pay | Admitting: Pulmonary Disease

## 2023-03-18 ENCOUNTER — Other Ambulatory Visit: Payer: Self-pay | Admitting: Pulmonary Disease

## 2023-03-18 ENCOUNTER — Ambulatory Visit: Payer: No Typology Code available for payment source

## 2023-03-18 MED ORDER — TRELEGY ELLIPTA 200-62.5-25 MCG/ACT IN AEPB: 1.0000 | INHALATION_SPRAY | Freq: Every day | RESPIRATORY_TRACT | 5 refills | Status: DC

## 2023-03-18 NOTE — Telephone Encounter (Signed)
Patient states needs new RX for Trelegy inhaler. Pharmacy is [facility]. Patient phone number is [phone].

## 2023-03-18 NOTE — Telephone Encounter (Signed)
I have sent the prescription to [facility] and I notified the patient.  Nothing further needed.

## 2023-03-22 ENCOUNTER — Ambulatory Visit: Payer: No Typology Code available for payment source | Admitting: Nurse Practitioner

## 2023-03-22 ENCOUNTER — Telehealth: Payer: Self-pay | Admitting: Pulmonary Disease

## 2023-03-22 NOTE — Telephone Encounter (Signed)
We do not deal with DME billing. She needs to call the DME company.

## 2023-03-22 NOTE — Telephone Encounter (Signed)
Pt called the office stating that she all of a sudden started receiving bills from DME for her oxygen. Stated that she used to never receive any bills but then started receiving bills and said she is not sure why.  I looked through pt's chart and do not see where any recent order for pt's oxygen has been placed.  I told pt that with this being bill for O2, she needed to reach out to DME but pt wanted [place] to handle this.   Routing to Dr. Reece Agar and [place] triage for review. Please advise.

## 2023-03-22 NOTE — Telephone Encounter (Signed)
I have spoke with the patient. She said [employer] said that her O2 was not approved and that is why she was having to pay.  I spoke with [employer] re-authorization department. They assured me that the patient's O2 was authorized and that her insurance was only covering 80% of the O2.  I notified the patient. She will reach out to [employer] or her insurance company to as about the O2.  Nothing further needed.

## 2023-03-24 ENCOUNTER — Telehealth: Payer: Self-pay | Admitting: Family Medicine

## 2023-03-24 ENCOUNTER — Ambulatory Visit: Payer: Self-pay

## 2023-03-24 DIAGNOSIS — J449 Chronic obstructive pulmonary disease, unspecified: Secondary | ICD-10-CM | POA: Diagnosis not present

## 2023-03-24 DIAGNOSIS — B029 Zoster without complications: Secondary | ICD-10-CM

## 2023-03-24 DIAGNOSIS — B0229 Other postherpetic nervous system involvement: Secondary | ICD-10-CM

## 2023-03-24 MED ORDER — TRAMADOL HCL 50 MG PO TABS
50.0000 mg | ORAL_TABLET | Freq: Three times a day (TID) | ORAL | 0 refills | Status: AC | PRN
Start: 2023-03-24 — End: 2023-03-31

## 2023-03-24 NOTE — Telephone Encounter (Signed)
See Nurse Triage encounter, already routed request to provider.

## 2023-03-24 NOTE — Telephone Encounter (Signed)
Patient called, left VM to return the call to the office to discuss symptoms with a nurse.  Summary: pain from Shingles?   Pt states that she is still having issues with her Shingles and is still in pain. Per pt she ran out of tramadol hcl medication PCP prescribed her for pain. Please advise. (Medication refill request has been sent)         traMADol (ULTRAM) 50 MG tablet [[id number]]  ENDED   Order Details Dose: 50 mg Route: Oral Frequency: Every 8 hours PRN  Dispense Quantity: 21 tablet Refills: 0        Sig: Take 1 tablet (50 mg total) by mouth every 8 (eight) hours as needed for up to 7 days.       Start Date: 03/10/23 End Date: 03/17/23  Written Date: 03/10/23 Expiration Date: 09/06/23     Associated Diagnoses: Herpes zoster without complication [B02.9]

## 2023-03-24 NOTE — Telephone Encounter (Signed)
Medication Refill - Medication: tramadol hcl Pt states PCP prescribed this medication a couple of weeks ago for her Shingles and she is needing a refill due to her still being in pain.   Has the patient contacted their pharmacy? No.  Preferred Pharmacy (with phone number or street name):  [facility] - [place], [place] - [address] Phone: [phone]  Fax: [phone]     Has the patient been seen for an appointment in the last year OR does the patient have an upcoming appointment? Yes.    Agent: Please be advised that RX refills may take up to 3 business days. We ask that you follow-up with your pharmacy.

## 2023-03-24 NOTE — Addendum Note (Signed)
Addended by: [name] on: 03/24/2023 05:40 PM   Modules accepted: Orders

## 2023-03-24 NOTE — Telephone Encounter (Signed)
  Chief Complaint: shingles pain Symptoms: pain from shingles rash on L ribs and L side, intermittent but severe when present  Frequency: 03/02/23 Pertinent Negatives: NA Disposition: ED /[] Urgent Care (no appt availability in office) / Appointment(In office/virtual)/  [facility]/ Home Care/ Refused Recommended Disposition /[]  Mobile Bus/  Follow-up with PCP Additional Notes: pt would like to have refill on tramadol d/t still having severe pain from shingles rash. Pt states comes and goes but when present it is awful and she is tired and feels awful. Took last tramadol this morning and has been taking Ibuprofen that isn't helping with pain. Advised pt I would send message back to provider since Tramadol no longer on med list. Pt also asking what else can be given to help build her immune system since she has been so sick recently and has no energy. Advised pt I would have nurse FU with her.   Reason for Disposition  [1] Shingles rash AND [2] onset > 72 hours ago (3 days)  Answer Assessment - Initial Assessment Questions 1. APPEARANCE of RASH: "Describe the rash."      Shingles  2. LOCATION: "Where is the rash located?"      L ribs and L side  3. ONSET: "When did the rash start?"      Ongoing since 03/02/23 4. ITCHING: "Does the rash itch?" If Yes, ask: "How bad is the itch?"  (Scale 1-10; or mild, moderate, severe)     yes 5. PAIN: "Does the rash hurt?" If Yes, ask: "How bad is the pain?"  (Scale 0-10; or none, mild, moderate, severe)    - NONE (0): no pain    - MILD (1-3): doesn't interfere with normal activities     - MODERATE (4-7): interferes with normal activities or awakens from sleep     - SEVERE (8-10): excruciating pain, unable to do any normal activities     10 6. OTHER SYMPTOMS: "Do you have any other symptoms?" (e.g., fever)  Protocols used: Shingles (Zoster)-A-AH

## 2023-03-24 NOTE — Telephone Encounter (Signed)
Please notify patient that I will agree to send refill on Tramadol to take as needed for post herpetic neuralgia pain following shingles.  I cannot manage this long term, so we would not be able to keep refilling the Tramadol.  I did not see it referenced in the phone triage message, but could you check with patient on the Gabapentin dosing? This was also ordered for her for the shingles nerve pain.  She should have started Gabapentin  at bedtime and be increasing the dose.  What dose did she reach and is this helping? We may need to keep increasing her Gabapentin if she still has nerve pain.  Thanks  [name], DO [facility] 03/24/2023, 5:40 PM

## 2023-03-25 ENCOUNTER — Telehealth: Payer: Self-pay

## 2023-03-25 ENCOUNTER — Telehealth: Payer: No Typology Code available for payment source

## 2023-03-25 ENCOUNTER — Ambulatory Visit (INDEPENDENT_AMBULATORY_CARE_PROVIDER_SITE_OTHER): Payer: No Typology Code available for payment source

## 2023-03-25 DIAGNOSIS — J432 Centrilobular emphysema: Secondary | ICD-10-CM

## 2023-03-25 DIAGNOSIS — E782 Mixed hyperlipidemia: Secondary | ICD-10-CM

## 2023-03-25 DIAGNOSIS — R7303 Prediabetes: Secondary | ICD-10-CM

## 2023-03-25 NOTE — Telephone Encounter (Signed)
Left message for patient to call back.  PEC please give message if patient calls back.

## 2023-03-25 NOTE — Chronic Care Management (AMB) (Cosign Needed)
Chronic Care Management   CCM RN Visit Note  03/25/2023 Name: Brandi [name] MRN: [id number] DOB: 1956-03-22  Subjective: Brandi [name] is a 67 y.o. year old female who is a primary care patient of Brandi Cords, DO. The patient was referred to the Chronic Care Management team for assistance with care management needs subsequent to provider initiation of CCM services and plan of care.    Today's Visit:  Engaged with patient by telephone for follow up visit.        Goals Addressed             This Visit's Progress    CCM Expected Outcome:  Monitor, Self-Manage and Reduce Symptoms of  COPD Emphysema       Current Barriers:  Knowledge Deficits related to the need for regular follow up for COPD and other chronic conditions Care Coordination needs related to expressed needs of the patient with COPD and the need for collaboration with the providers in a patient with COPD Chronic Disease Management support and education needs related to effective management of COPD  Planned Interventions: Provided patient with basic written and verbal COPD education on self care/management/and exacerbation prevention Advised patient to track and manage COPD triggers. The patient currently has pneumonia and shingles. She states that the weather does cause her to have exacerbations with the pollen and changes in the temperatures. Education and support provided. The patient states she is doing better with her breathing since seeing her pulmonary provider. Oxygen saturation was 96 while on the phone. She does feel she has a sinus infection as she is coughing up green sputum. She states theses are the symptoms she has when she has a sinus infection. Helped get an appointment with the pcp on 03-26-2023 at 10 am. Collaboration with pcp and admin staff. Provided written and verbal instructions on pursed lip breathing and utilized returned demonstration as teach back Provided instruction about proper use of  medications used for management of COPD including inhalers. Has just switched from [facility] to Trelegy and has her inhalers. Uses oxygen at night and when needed Advised patient to self assesses COPD action plan zone and make appointment with provider if in the yellow zone for 48 hours without improvement Advised patient to engage in light exercise as tolerated 3-5 days a week to aid in the the management of COPD Provided education about and advised patient to utilize infection prevention strategies to reduce risk of respiratory infection. Education on taking all medications as directed. She has two more days of antibiotics to take for her pneumonia. Collaboration with the pcp concerning interactions of medications and if there were any related to the new medications she is taking for shingles.  Discussed the importance of adequate rest and management of fatigue with COPD Screening for signs and symptoms of depression related to chronic disease state  Assessed social determinant of health barriers  Symptom Management: Take medications as prescribed   Attend all scheduled provider appointments Call provider office for new concerns or questions  call the Suicide and Crisis Lifeline: 988 call the [place] National Suicide Prevention Lifeline: 704-137-4109 or TTY: 859-810-9939 TTY 323-862-7778) to talk to a trained counselor call 1-800-273-TALK (toll free, 24 hour hotline) if experiencing a Mental Health or Behavioral Health Crisis  avoid second hand smoke eliminate smoking in my home identify and avoid work-related triggers identify and remove indoor air pollutants limit outdoor activity during cold weather listen for public air quality announcements every day do breathing exercises every day  develop a rescue plan eliminate symptom triggers at home follow rescue plan if symptoms flare-up  Follow Up Plan: Telephone follow up appointment with care management team member scheduled for: 04-26-2023 at  0945 am       CCM Expected outcome:  Monitor, Self-Manage and Reduce Symptoms of HLD       Current Barriers:  Knowledge Deficits related to the importance of regular visits to see the provider for effective management of HLD and other chronic conditions Chronic Disease Management support and education needs related to effective management of HLD Lab Results  Component Value Date   CHOL 165 12/22/2021   HDL 63 12/22/2021   LDLCALC 89 12/22/2021   TRIG 64 12/22/2021   CHOLHDL 2.6 12/22/2021    Planned Interventions: Provider established cholesterol goals reviewed. Needs new labwork done. Education and support given; Counseled on importance of regular laboratory monitoring as prescribed; Provided HLD educational materials; Reviewed role and benefits of statin for ASCVD risk reduction; Discussed strategies to manage statin-induced myalgias; Reviewed importance of limiting foods high in cholesterol; Reviewed exercise goals and target of 150 minutes per week; Screening for signs and symptoms of depression related to chronic disease state;  Assessed social determinant of health barriers;   Symptom Management: Take medications as prescribed   Attend all scheduled provider appointments Call provider office for new concerns or questions  call the Suicide and Crisis Lifeline: 988 call the [place] National Suicide Prevention Lifeline: 539-448-5426 or TTY: 904-028-0146 TTY (684)333-3983) to talk to a trained counselor call 1-800-273-TALK (toll free, 24 hour hotline) if experiencing a Mental Health or Behavioral Health Crisis  - call for medicine refill 2 or 3 days before it runs out - take all medications exactly as prescribed - call doctor with any symptoms you believe are related to your medicine - call doctor when you experience any new symptoms - go to all doctor appointments as scheduled  Follow Up Plan: Telephone follow up appointment with care management team member scheduled for:  04-26-2023 at 0945 am        CCM Expected Outcome:  Monitor, Self-Manage and Reduce Symptoms of: Pre Diabetes       Current Barriers:  Knowledge Deficits related to the importance of regular follow up for Chronic Disease Management and Care Coordination Needs and effective management of pre- DM Care Coordination needs related to food resources and educational needs  in a patient with DM Chronic Disease Management support and education needs related to effective management of DM Lab Results  Component Value Date   HGBA1C 5.8 (H) 01/20/2021     Planned Interventions: Provided education to patient about basic DM disease process; Reviewed medications with patient and discussed importance of medication adherence. The patient is compliant with medications. Does not want to take prednisone at this causes her issues;        Reviewed prescribed diet with patient heart healthy/ADA diet. The patient states sometimes she has issues with her blood sugars getting low; Counseled on importance of regular laboratory monitoring as prescribed. Review of the need to get updated A1C;        Discussed plans with patient for ongoing care management follow up and provided patient with direct contact information for care management team;      Provided patient with written educational materials related to hypo and hyperglycemia and importance of correct treatment;       Reviewed scheduled/upcoming provider appointments including: She had an appointment for 4-29 for follow up with the pcp  but is having some acute changes and needs to be seen sooner. She will see the pcp on 03-26-2023 at 10 am         call provider for findings outside established parameters;       Referral made to community resources care guide team for assistance with food resources due to financial strain;      Review of patient status, including review of consultants reports, relevant laboratory and other test results, and medications completed;        Advised patient to discuss changes in her DM, questions, concerns, and the need for new labwork with provider;      Screening for signs and symptoms of depression related to chronic disease state;        Assessed social determinant of health barriers;        The patient currently has shingles. The patient also has pneumonia. Went to urgent care on 03-02-2023 and has medications for shingles. She is having pain from shingles and the gabapentin makes her feel weird. She has not been taking the gabapentin. The patient states that she wants to talk to the pcp about her concerns. She says her shingles were getting better but now they seem to be worse again. Education and support given. Will see the pcp tomorrow. Encouraged her to write down questions to discuss with the pcp.  Symptom Management: Take medications as prescribed   Attend all scheduled provider appointments Call provider office for new concerns or questions  call the Suicide and Crisis Lifeline: 988 call the [place] National Suicide Prevention Lifeline: 920-882-0535 or TTY: 364-542-4976 TTY 405-779-2348) to talk to a trained counselor call 1-800-273-TALK (toll free, 24 hour hotline) if experiencing a Mental Health or Behavioral Health Crisis  check feet daily for cuts, sores or redness trim toenails straight across wash and dry feet carefully every day wear comfortable, cotton socks wear comfortable, well-fitting shoes  Follow Up Plan: Telephone follow up appointment with care management team member scheduled for: 04-26-2023 at 0945 am          Plan:Telephone follow up appointment with care management team member scheduled for:  04-26-2023 at 0945 am  Alto Denver RN, MSN, CCM RN Care Manager  Chronic Care Management Direct Number: [phone]

## 2023-03-25 NOTE — Telephone Encounter (Signed)
   CCM RN Visit Note   03-25-2023 Name: Brandi [name] MRN: [id number]      DOB: [birth year]  Subjective: Brandi [name] is a 67 y.o. year old female who is a primary care patient of Dr. Althea Charon. The patient was referred to the Chronic Care Management team for assistance with care management needs subsequent to provider initiation of CCM services and plan of care.      See new encounter  Plan:Telephone follow up appointment with care management team member scheduled for:  as scheduled.   Alto Denver RN, MSN, CCM RN Care Manager  Chronic Care Management Direct Number: [phone]

## 2023-03-25 NOTE — Patient Instructions (Signed)
Please call the care guide team at [phone] if you need to cancel or reschedule your appointment.   If you are experiencing a Mental Health or Behavioral Health Crisis or need someone to talk to, please call the Suicide and Crisis Lifeline: 988 call the [place] National Suicide Prevention Lifeline: (719) 061-2781 or TTY: 2296433457 TTY (984) 590-8540) to talk to a trained counselor call 1-800-273-TALK (toll free, 24 hour hotline)   Following is a copy of the CCM Program Consent:  CCM service includes personalized support from designated clinical staff supervised by the physician, including individualized plan of care and coordination with other care providers 24/7 contact phone numbers for assistance for urgent and routine care needs. Service will only be billed when office clinical staff spend 20 minutes or more in a month to coordinate care. Only one practitioner may furnish and bill the service in a calendar month. The patient may stop CCM services at amy time (effective at the end of the month) by phone call to the office staff. The patient will be responsible for cost sharing (co-pay) or up to 20% of the service fee (after annual deductible is met)  Following is a copy of your full provider care plan:   Goals Addressed             This Visit's Progress    CCM Expected Outcome:  Monitor, Self-Manage and Reduce Symptoms of  COPD Emphysema       Current Barriers:  Knowledge Deficits related to the need for regular follow up for COPD and other chronic conditions Care Coordination needs related to expressed needs of the patient with COPD and the need for collaboration with the providers in a patient with COPD Chronic Disease Management support and education needs related to effective management of COPD  Planned Interventions: Provided patient with basic written and verbal COPD education on self care/management/and exacerbation prevention Advised patient to track and manage COPD  triggers. The patient currently has pneumonia and shingles. She states that the weather does cause her to have exacerbations with the pollen and changes in the temperatures. Education and support provided. The patient states she is doing better with her breathing since seeing her pulmonary provider. Oxygen saturation was 96 while on the phone. She does feel she has a sinus infection as she is coughing up green sputum. She states theses are the symptoms she has when she has a sinus infection. Helped get an appointment with the pcp on 03-26-2023 at 10 am. Collaboration with pcp and admin staff. Provided written and verbal instructions on pursed lip breathing and utilized returned demonstration as teach back Provided instruction about proper use of medications used for management of COPD including inhalers. Has just switched from [facility] to Trelegy and has her inhalers. Uses oxygen at night and when needed Advised patient to self assesses COPD action plan zone and make appointment with provider if in the yellow zone for 48 hours without improvement Advised patient to engage in light exercise as tolerated 3-5 days a week to aid in the the management of COPD Provided education about and advised patient to utilize infection prevention strategies to reduce risk of respiratory infection. Education on taking all medications as directed. She has two more days of antibiotics to take for her pneumonia. Collaboration with the pcp concerning interactions of medications and if there were any related to the new medications she is taking for shingles.  Discussed the importance of adequate rest and management of fatigue with COPD Screening for signs and  symptoms of depression related to chronic disease state  Assessed social determinant of health barriers  Symptom Management: Take medications as prescribed   Attend all scheduled provider appointments Call provider office for new concerns or questions  call the Suicide  and Crisis Lifeline: 988 call the [place] National Suicide Prevention Lifeline: 8574620269 or TTY: (418) 056-9352 TTY (361) 671-8858) to talk to a trained counselor call 1-800-273-TALK (toll free, 24 hour hotline) if experiencing a Mental Health or Behavioral Health Crisis  avoid second hand smoke eliminate smoking in my home identify and avoid work-related triggers identify and remove indoor air pollutants limit outdoor activity during cold weather listen for public air quality announcements every day do breathing exercises every day develop a rescue plan eliminate symptom triggers at home follow rescue plan if symptoms flare-up  Follow Up Plan: Telephone follow up appointment with care management team member scheduled for: 04-26-2023 at 0945 am       CCM Expected outcome:  Monitor, Self-Manage and Reduce Symptoms of HLD       Current Barriers:  Knowledge Deficits related to the importance of regular visits to see the provider for effective management of HLD and other chronic conditions Chronic Disease Management support and education needs related to effective management of HLD Lab Results  Component Value Date   CHOL 165 12/22/2021   HDL 63 12/22/2021   LDLCALC 89 12/22/2021   TRIG 64 12/22/2021   CHOLHDL 2.6 12/22/2021    Planned Interventions: Provider established cholesterol goals reviewed. Needs new labwork done. Education and support given; Counseled on importance of regular laboratory monitoring as prescribed; Provided HLD educational materials; Reviewed role and benefits of statin for ASCVD risk reduction; Discussed strategies to manage statin-induced myalgias; Reviewed importance of limiting foods high in cholesterol; Reviewed exercise goals and target of 150 minutes per week; Screening for signs and symptoms of depression related to chronic disease state;  Assessed social determinant of health barriers;   Symptom Management: Take medications as prescribed   Attend  all scheduled provider appointments Call provider office for new concerns or questions  call the Suicide and Crisis Lifeline: 988 call the [place] National Suicide Prevention Lifeline: 5808736507 or TTY: 989-881-7786 TTY 256-465-1730) to talk to a trained counselor call 1-800-273-TALK (toll free, 24 hour hotline) if experiencing a Mental Health or Behavioral Health Crisis  - call for medicine refill 2 or 3 days before it runs out - take all medications exactly as prescribed - call doctor with any symptoms you believe are related to your medicine - call doctor when you experience any new symptoms - go to all doctor appointments as scheduled  Follow Up Plan: Telephone follow up appointment with care management team member scheduled for: 04-26-2023 at 0945 am        CCM Expected Outcome:  Monitor, Self-Manage and Reduce Symptoms of: Pre Diabetes       Current Barriers:  Knowledge Deficits related to the importance of regular follow up for Chronic Disease Management and Care Coordination Needs and effective management of pre- DM Care Coordination needs related to food resources and educational needs  in a patient with DM Chronic Disease Management support and education needs related to effective management of DM Lab Results  Component Value Date   HGBA1C 5.8 (H) 01/20/2021     Planned Interventions: Provided education to patient about basic DM disease process; Reviewed medications with patient and discussed importance of medication adherence. The patient is compliant with medications. Does not want to take prednisone at this  causes her issues;        Reviewed prescribed diet with patient heart healthy/ADA diet. The patient states sometimes she has issues with her blood sugars getting low; Counseled on importance of regular laboratory monitoring as prescribed. Review of the need to get updated A1C;        Discussed plans with patient for ongoing care management follow up and provided patient  with direct contact information for care management team;      Provided patient with written educational materials related to hypo and hyperglycemia and importance of correct treatment;       Reviewed scheduled/upcoming provider appointments including: She had an appointment for 4-29 for follow up with the pcp but is having some acute changes and needs to be seen sooner. She will see the pcp on 03-26-2023 at 10 am         call provider for findings outside established parameters;       Referral made to community resources care guide team for assistance with food resources due to financial strain;      Review of patient status, including review of consultants reports, relevant laboratory and other test results, and medications completed;       Advised patient to discuss changes in her DM, questions, concerns, and the need for new labwork with provider;      Screening for signs and symptoms of depression related to chronic disease state;        Assessed social determinant of health barriers;        The patient currently has shingles. The patient also has pneumonia. Went to urgent care on 03-02-2023 and has medications for shingles. She is having pain from shingles and the gabapentin makes her feel weird. She has not been taking the gabapentin. The patient states that she wants to talk to the pcp about her concerns. She says her shingles were getting better but now they seem to be worse again. Education and support given. Will see the pcp tomorrow. Encouraged her to write down questions to discuss with the pcp.  Symptom Management: Take medications as prescribed   Attend all scheduled provider appointments Call provider office for new concerns or questions  call the Suicide and Crisis Lifeline: 988 call the [place] National Suicide Prevention Lifeline: (609)688-1276 or TTY: 801-278-7578 TTY (713)301-9264) to talk to a trained counselor call 1-800-273-TALK (toll free, 24 hour hotline) if experiencing a  Mental Health or Behavioral Health Crisis  check feet daily for cuts, sores or redness trim toenails straight across wash and dry feet carefully every day wear comfortable, cotton socks wear comfortable, well-fitting shoes  Follow Up Plan: Telephone follow up appointment with care management team member scheduled for: 04-26-2023 at 0945 am          Patient verbalizes understanding of instructions and care plan provided today and agrees to view in MyChart. Active MyChart status and patient understanding of how to access instructions and care plan via MyChart confirmed with patient.  Telephone follow up appointment with care management team member scheduled for: 04-26-2023 at 0945 am

## 2023-03-26 ENCOUNTER — Ambulatory Visit (INDEPENDENT_AMBULATORY_CARE_PROVIDER_SITE_OTHER): Payer: No Typology Code available for payment source | Admitting: Family Medicine

## 2023-03-26 ENCOUNTER — Encounter: Payer: Self-pay | Admitting: Family Medicine

## 2023-03-26 ENCOUNTER — Ambulatory Visit: Payer: No Typology Code available for payment source | Admitting: Family Medicine

## 2023-03-26 VITALS — BP 100/60 | HR 84 | Resp 16 | Ht 64.0 in | Wt 118.8 lb

## 2023-03-26 DIAGNOSIS — J189 Pneumonia, unspecified organism: Secondary | ICD-10-CM

## 2023-03-26 DIAGNOSIS — E538 Deficiency of other specified B group vitamins: Secondary | ICD-10-CM | POA: Diagnosis not present

## 2023-03-26 DIAGNOSIS — E559 Vitamin D deficiency, unspecified: Secondary | ICD-10-CM | POA: Diagnosis not present

## 2023-03-26 DIAGNOSIS — E782 Mixed hyperlipidemia: Secondary | ICD-10-CM | POA: Diagnosis not present

## 2023-03-26 DIAGNOSIS — B0229 Other postherpetic nervous system involvement: Secondary | ICD-10-CM | POA: Diagnosis not present

## 2023-03-26 DIAGNOSIS — J432 Centrilobular emphysema: Secondary | ICD-10-CM | POA: Diagnosis not present

## 2023-03-26 DIAGNOSIS — J0110 Acute frontal sinusitis, unspecified: Secondary | ICD-10-CM | POA: Diagnosis not present

## 2023-03-26 MED ORDER — VALACYCLOVIR HCL 1 G PO TABS
1000.0000 mg | ORAL_TABLET | Freq: Three times a day (TID) | ORAL | 0 refills | Status: DC
Start: 2023-03-26 — End: 2023-04-20

## 2023-03-26 MED ORDER — TIZANIDINE HCL 2 MG PO TABS
2.0000 mg | ORAL_TABLET | Freq: Three times a day (TID) | ORAL | 2 refills | Status: DC | PRN
Start: 2023-03-26 — End: 2024-09-09

## 2023-03-26 MED ORDER — DOXYCYCLINE HYCLATE 100 MG PO TABS
100.0000 mg | ORAL_TABLET | Freq: Two times a day (BID) | ORAL | 0 refills | Status: DC
Start: 2023-03-26 — End: 2023-04-20

## 2023-03-26 NOTE — Progress Notes (Signed)
Subjective:    Patient ID: Brandi [name], female    DOB: [birth year], 67 y.o.   MRN: [id number]  Brandi [name] is a 67 y.o. female presenting on 03/26/2023 for Pneumonia (In 1/24 ) and Shingles (C/o Left rib area and around on back)   HPI  Pneumonia R Middle, CAP, Follow up Initial visit for CAP 12/2022 Treated with Augmentin and Azithromycin Next 02/2023 treated with Levaquin course Also completed several rounds of Prednisone She has followed with Pulmonology but has been lost to follow up as they requested testing but she did not feel up to it yet. Initially Breztri twice a day then switched to Trelegy once daily doing better overall with the Trelegy Also on Allergy pill for breathing with improvement She does not take Theophylline   Last X-ray 02/2023 showed improved interval but some residual pneumonia evidence.  She is waiting on PFT and CT first to then schedule w/ follow up with [facility] Pulmonology apt.  Sinusitis Thicker productive sinus drainage now.  Post Herpetic Neuralgia Initial Shingles flare Urgent Care Visit 03/02/23 treated w Tramadol and Valtrex I saw her on 03/10/23 for follow-up, rx Gabapentin failed 100mg , inc up from 1 to 3 pills, once per day she was feeling dizzy groggy on this and came off of it. She still has post herpetic neuralgia pains across L abdomen flank and back, the rash is healing, she uses topical ointment and calamine. She has been renewed on Tramadol rx but did not pick it up yet She stopped Gabapentin.      03/26/2023   10:03 AM 03/03/2023    3:24 PM 08/04/2022    1:15 PM  Depression screen PHQ 2/9  Decreased Interest 0 1 1  Down, Depressed, Hopeless 0 0 3  PHQ - 2 Score 0 1 4  Altered sleeping 0  0  Tired, decreased energy 0  0  Change in appetite 0  1  Feeling bad or failure about yourself  0  0  Trouble concentrating 0  1  Moving slowly or fidgety/restless 0  0  Suicidal thoughts 0  0  PHQ-9 Score 0  6  Difficult doing work/chores  Not difficult at all  Somewhat difficult    Social History   Tobacco Use   Smoking status: Former    Packs/day: 0.75    Years: 41.00    Additional pack years: 0.00    Total pack years: 30.75    Types: Cigarettes    Quit date: 12/08/2015    Years since quitting: 7.3   Smokeless tobacco: Never  Vaping Use   Vaping Use: Never used  Substance Use Topics   Alcohol use: No   Drug use: No    Review of Systems Per HPI unless specifically indicated above     Objective:    BP 100/60 (BP Location: Left Arm, Patient Position: Sitting, Cuff Size: Normal)   Pulse 84   Resp 16   Ht 5\' 4"  (1.626 m)   Wt 118 lb 12.8 oz (53.9 kg)   SpO2 95%   BMI 20.39 kg/m   Wt Readings from Last 3 Encounters:  03/26/23 118 lb 12.8 oz (53.9 kg)  03/10/23 119 lb (54 kg)  03/01/23 122 lb (55.3 kg)    Physical Exam Vitals and nursing note reviewed.  Constitutional:      General: She is not in acute distress.    Appearance: She is well-developed. She is not diaphoretic.     Comments: Well-appearing, comfortable, cooperative  HENT:     Head: Normocephalic and atraumatic.  Eyes:     General:        Right eye: No discharge.        Left eye: No discharge.     Conjunctiva/sclera: Conjunctivae normal.  Neck:     Thyroid: No thyromegaly.  Cardiovascular:     Rate and Rhythm: Normal rate and regular rhythm.     Heart sounds: Normal heart sounds. No murmur heard. Pulmonary:     Effort: Pulmonary effort is normal. No respiratory distress.     Breath sounds: No wheezing or rales.     Comments: Reduced air movement but no abnormal wheezing or crackles Musculoskeletal:        General: Normal range of motion.     Cervical back: Normal range of motion and neck supple.  Lymphadenopathy:     Cervical: No cervical adenopathy.  Skin:    General: Skin is warm and dry.     Findings: No erythema or rash.  Neurological:     Mental Status: She is alert and oriented to person, place, and time.  Psychiatric:         Behavior: Behavior normal.     Comments: Well groomed, good eye contact, normal speech and thoughts    I have personally reviewed the radiology report from 02/23/23 on CXR repeat.  CLINICAL DATA:  67 year old female with shortness of breath   EXAM: CHEST - 2 VIEW   COMPARISON:  01/05/2023, 12/04/2021, CT chest 04/09/2021   FINDINGS: Cardiomediastinal silhouette unchanged in size and contour. No evidence of central vascular congestion. No interlobular septal thickening.   Stigmata of emphysema, with increased retrosternal airspace, flattened hemidiaphragms, increased AP diameter, and hyperinflation on the AP view.   Near complete resolution of the mixed interstitial and airspace opacity of the right lung.   Persisting opacity in the distribution of the middle lobe on the lateral view.   No new airspace disease.   No pneumothorax or pleural effusion. Coarsened interstitial markings.   No acute displaced fracture. Degenerative changes of the spine.   IMPRESSION: Improved though incompletely resolved middle lobe infection.   Emphysema     Electronically Signed   By: Gilmer Mor D.O.   On: 02/26/2023 09:38  Results for orders placed or performed in visit on 03/16/23  Fecal occult blood, imunochemical  Result Value Ref Range   IFOBT Negative       Assessment & Plan:   Problem List Items Addressed This Visit     Centrilobular emphysema   Hyperlipidemia   Relevant Orders   TSH   Vitamin D deficiency   Relevant Orders   VITAMIN D 25 Hydroxy (Vit-D Deficiency, Fractures)   Other Visit Diagnoses     Post herpetic neuralgia    -  Primary   Relevant Medications   tiZANidine (ZANAFLEX) 2 MG tablet   valACYclovir (VALTREX) 1000 MG tablet   Acute non-recurrent frontal sinusitis       Relevant Medications   valACYclovir (VALTREX) 1000 MG tablet   doxycycline (VIBRA-TABS) 100 MG tablet   Community acquired pneumonia of left lung, unspecified part of lung        Relevant Medications   valACYclovir (VALTREX) 1000 MG tablet   doxycycline (VIBRA-TABS) 100 MG tablet   B12 deficiency       Relevant Orders   Vitamin B12       Discussion today on various issues  With regards to her Advanced Emphysema COPD. She admits  she has not returned to Pulmonology for further testing. She was waiting for the Shingles flare to improve and reduce pain and avoid risk of contagion.  She plans to contact [facility] to arrange PFT / CT and then follow up  I advised based on recent Pneumonia course over past 3+ months, she has had various antibiotics / prednisone courses already, last CXR end of March 2024, was improved. I would not repeat CXR now if she has upcoming CT  Given Sinusitis symptoms now, we discussed next option to consider coverage with other antibiotic different from what she has had already  Will trial Doxycycline, instructions given.  For Post Herpetic Neuralgia It seems her shingles is healing, but still persistent symptoms Will extend the Valtrex course 7 more days Already refilled Tramadol to pharmacy earlier this week. She has not picked up yet, she can pick it up. Stop Gabapentin if side effect Rx Tizanidine AS NEEDED for neuropathic pain    Meds ordered this encounter  Medications   tiZANidine (ZANAFLEX) 2 MG tablet    Sig: Take 1 tablet (2 mg total) by mouth every 8 (eight) hours as needed for muscle spasms.    Dispense:  60 tablet    Refill:  2   valACYclovir (VALTREX) 1000 MG tablet    Sig: Take 1 tablet (1,000 mg total) by mouth 3 (three) times daily. For shingles    Dispense:  21 tablet    Refill:  0   doxycycline (VIBRA-TABS) 100 MG tablet    Sig: Take 1 tablet (100 mg total) by mouth 2 (two) times daily. For 10 days. Take with full glass of water, stay upright 30 min after taking.    Dispense:  20 tablet    Refill:  0      Follow up plan: Return if symptoms worsen or fail to improve.   [name],  DO [facility] 03/26/2023, 10:07 AM

## 2023-03-26 NOTE — Patient Instructions (Addendum)
Thank you for coming to the office today.  Remember to call to schedule the [facility] lung testing and CT and apt  Take Tramadol as needed for pain  Take Tizanidine as needed 3 times a day, especially night time  Start taking Doxycycline antibiotic  twice daily for 10 days. Take with full glass of water and stay upright for at least 30 min after taking, may be seated or standing, but should NOT lay down. This is just a safety precaution, if this medicine does not go all the way down throat well it could cause some burning discomfort to throat and esophagus.    Blister and Rash Care Take a cool bath or apply cool compresses to the area of the rash or blisters as directed by your health care provider. This may help with pain and itching. Keep your rash covered with a loose bandage (dressing). Wear loose-fitting clothing to help ease the pain of material rubbing against the rash. Keep your rash and blisters clean with mild soap and cool water or as directed by your health care provider. Check your rash every day for signs of infection. These include redness, swelling, and pain that lasts or increases. Do not pick your blisters. Do not scratch your rash.  As mentioned, some patients experience "Post-Herpetic Neuralgia" or a persistent sensation or feeling of "nerve irritation or pain" that can last for days to weeks to months or longer in this same area.   Contact your doctor if: Your pain is not relieved with prescribed medicines. Your pain does not get better after the rash heals. Your rash looks infected. Signs of infection include redness, swelling, and pain that lasts or increases.  Please seek more immediate medical attention at the [facility] Emergency [facility] IF The rash is on your face or nose. You have facial pain, pain around your eye area, or loss of feeling on one side of your face. You have ear pain or you have ringing in your ear. You have loss of taste. Your  condition gets worse.    ------------------------------------------------------------------------------------------------------- Please review the additional information below about Shingles, if you have additional concerns.  Shingles Shingles, which is also known as herpes zoster, is an infection that causes a painful skin rash and fluid-filled blisters. Shingles is not related to genital herpes, which is a sexually transmitted infection. Shingles only develops in people who: Have had chickenpox. Have received the chickenpox vaccine. (This is rare.)  What are the causes? Shingles is caused by varicella-zoster virus (VZV). This is the same virus that causes chickenpox. After exposure to VZV, the virus stays in the body in an inactive (dormant) state. Shingles develops if the virus reactivates. This can happen many years after the initial exposure to VZV. It is not known what causes this virus to reactivate. What increases the risk? People who have had chickenpox or received the chickenpox vaccine are at risk for shingles. Infection is more common in people who: Are older than age 53. Have a weakened defense (immune) system, such as those with HIV, AIDS, or cancer. Are taking medicines that weaken the immune system, such as transplant medicines. Are under great stress.  What are the signs or symptoms? Early symptoms of this condition include itching, tingling, and pain in an area on your skin. Pain may be described as burning, stabbing, or throbbing. A few days or weeks after symptoms start, a painful red rash appears, usually on one side of the body in a bandlike or beltlike  pattern. The rash eventually turns into fluid-filled blisters that break open, scab over, and dry up in about 2-3 weeks. At any time during the infection, you may also develop: A fever. Chills. A headache. An upset stomach.  How is this diagnosed? This condition is diagnosed with a skin exam. Sometimes, skin or  fluid samples are taken from the blisters before a diagnosis is made. These samples are examined under a microscope or sent to a lab for testing. How is this treated? There is no specific cure for this condition. Your health care provider will probably prescribe medicines to help you manage pain, recover more quickly, and avoid long-term problems. Medicines may include: Antiviral drugs. Anti-inflammatory drugs. Pain medicines.  If the area involved is on your face, you may be referred to a specialist, such as an eye doctor (ophthalmologist) or an ear, nose, and throat (ENT) doctor to help you avoid eye problems, chronic pain, or disability. Follow these instructions at home: Medicines Take medicines only as directed by your health care provider. Apply an anti-itch or numbing cream to the affected area as directed by your health care provider. General instructions Rest as directed by your health care provider. Keep all follow-up visits as directed by your health care provider. This is important. Until your blisters scab over, your infection can cause chickenpox in people who have never had it or been vaccinated against it. To prevent this from happening, avoid contact with other people, especially: Babies. Pregnant women. Children who have eczema. Elderly people who have transplants. People who have chronic illnesses, such as leukemia or AIDS. This information is not intended to replace advice given to you by your health care provider. Make sure you discuss any questions you have with your health care provider. Document Released: 11/23/2005 Document Revised: 07/19/2016 Document Reviewed: 10/04/2014 Elsevier Interactive Patient Education  2018 [employer].    Please schedule a Follow-up Appointment to: Return if symptoms worsen or fail to improve.  If you have any other questions or concerns, please feel free to call the office or send a message through MyChart. You may also schedule an  earlier appointment if necessary.  Additionally, you may be receiving a survey about your experience at our office within a few days to 1 week by e-mail or mail. We value your feedback.  [name], DO [facility], [place]

## 2023-03-27 LAB — VITAMIN B12: Vitamin B-12: 503 pg/mL (ref 200–1100)

## 2023-03-27 LAB — VITAMIN D 25 HYDROXY (VIT D DEFICIENCY, FRACTURES): Vit D, 25-Hydroxy: 59 ng/mL (ref 30–100)

## 2023-03-27 LAB — TSH: TSH: 1.32 mIU/L (ref 0.40–4.50)

## 2023-03-30 ENCOUNTER — Ambulatory Visit: Payer: No Typology Code available for payment source | Admitting: Family Medicine

## 2023-04-05 ENCOUNTER — Ambulatory Visit: Payer: No Typology Code available for payment source | Admitting: Family Medicine

## 2023-04-06 DIAGNOSIS — J432 Centrilobular emphysema: Secondary | ICD-10-CM

## 2023-04-06 DIAGNOSIS — E782 Mixed hyperlipidemia: Secondary | ICD-10-CM

## 2023-04-20 ENCOUNTER — Encounter: Payer: Self-pay | Admitting: Family Medicine

## 2023-04-20 ENCOUNTER — Ambulatory Visit (INDEPENDENT_AMBULATORY_CARE_PROVIDER_SITE_OTHER): Payer: No Typology Code available for payment source | Admitting: Family Medicine

## 2023-04-20 VITALS — BP 110/60 | HR 85 | Temp 97.9°F | Ht 64.0 in | Wt 119.0 lb

## 2023-04-20 DIAGNOSIS — B0229 Other postherpetic nervous system involvement: Secondary | ICD-10-CM

## 2023-04-20 DIAGNOSIS — J0110 Acute frontal sinusitis, unspecified: Secondary | ICD-10-CM | POA: Diagnosis not present

## 2023-04-20 DIAGNOSIS — Z91038 Other insect allergy status: Secondary | ICD-10-CM | POA: Diagnosis not present

## 2023-04-20 DIAGNOSIS — J432 Centrilobular emphysema: Secondary | ICD-10-CM

## 2023-04-20 DIAGNOSIS — K219 Gastro-esophageal reflux disease without esophagitis: Secondary | ICD-10-CM

## 2023-04-20 MED ORDER — TRIAMCINOLONE ACETONIDE 0.5 % EX CREA
1.0000 | TOPICAL_CREAM | Freq: Two times a day (BID) | CUTANEOUS | 2 refills | Status: DC | PRN
Start: 2023-04-20 — End: 2024-09-09

## 2023-04-20 MED ORDER — SULFAMETHOXAZOLE-TRIMETHOPRIM 800-160 MG PO TABS
1.0000 | ORAL_TABLET | Freq: Two times a day (BID) | ORAL | 0 refills | Status: AC
Start: 2023-04-20 — End: 2023-04-27

## 2023-04-20 MED ORDER — OMEPRAZOLE 20 MG PO CPDR: 20.0000 mg | DELAYED_RELEASE_CAPSULE | Freq: Two times a day (BID) | ORAL | 3 refills | Status: DC

## 2023-04-20 NOTE — Patient Instructions (Addendum)
Thank you for coming to the office today.  Rx Bactrim sulfa drug to use ONLY IF NOT IMPROVING OR IF WORSENING Sinus Congestion / Respiratory Concern.  For Ant bite - use topical Triamcinolone 0.5% cream twice a day as needed for spot treatment  Re order Omeprazole (prilosec) for stomach acid twice a day  Nerve pain from Shingles can persist longer.   Please schedule a Follow-up Appointment to: Return if symptoms worsen or fail to improve.  If you have any other questions or concerns, please feel free to call the office or send a message through MyChart. You may also schedule an earlier appointment if necessary.  Additionally, you may be receiving a survey about your experience at our office within a few days to 1 week by e-mail or mail. We value your feedback.  [name], DO [facility], [place]

## 2023-04-20 NOTE — Progress Notes (Unsigned)
Subjective:    Patient ID: Brandi [name], female    DOB: 1955-12-19, 67 y.o.   MRN: [id number]  Brandi [name] is a 67 y.o. female presenting on 04/20/2023 for Insect Bite (Ant bites on hand, happen yesterday, put some cream on it ) and URI (Congestion for a week, took mucinex)   HPI  Ant Bites, Right Hand / Thumb Reports symptoms started yesterday after ant bite exposure on hands and caused localized swelling and pain reaction. She used some topical but out of cream  Sinusitis Congestion New onset worsening with sinus pressure pain congestion  GERD Needs re order on Omeprazole 20mg  twice a day, has been working Also has Pepcid if need  Post Herpetic Neuralgia Reviewed prior - Initial Shingles flare Urgent Care Visit 03/02/23 treated w Tramadol and Valtrex I saw her on 03/10/23 for follow-up, rx Gabapentin failed 100mg , inc up from 1 to 3 pills, once per day she was feeling dizzy groggy on this and came off of it. She still has post herpetic neuralgia pains across L abdomen flank and back, the rash is healing, she uses topical ointment and calamine.       03/26/2023   10:03 AM 03/03/2023    3:24 PM 08/04/2022    1:15 PM  Depression screen PHQ 2/9  Decreased Interest 0 1 1  Down, Depressed, Hopeless 0 0 3  PHQ - 2 Score 0 1 4  Altered sleeping 0  0  Tired, decreased energy 0  0  Change in appetite 0  1  Feeling bad or failure about yourself  0  0  Trouble concentrating 0  1  Moving slowly or fidgety/restless 0  0  Suicidal thoughts 0  0  PHQ-9 Score 0  6  Difficult doing work/chores Not difficult at all  Somewhat difficult    Social History   Tobacco Use   Smoking status: Former    Packs/day: 0.75    Years: 41.00    Additional pack years: 0.00    Total pack years: 30.75    Types: Cigarettes    Quit date: 12/08/2015    Years since quitting: 7.3   Smokeless tobacco: Never  Vaping Use   Vaping Use: Never used  Substance Use Topics   Alcohol use: No   Drug use: No     Review of Systems Per HPI unless specifically indicated above     Objective:    BP 110/60   Pulse 85   Temp 97.9 F (36.6 C)   Ht 5\' 4"  (1.626 m)   Wt 119 lb (54 kg)   SpO2 97%   BMI 20.43 kg/m   Wt Readings from Last 3 Encounters:  04/20/23 119 lb (54 kg)  03/26/23 118 lb 12.8 oz (53.9 kg)  03/10/23 119 lb (54 kg)    Physical Exam Vitals and nursing note reviewed.  Constitutional:      General: She is not in acute distress.    Appearance: Normal appearance. She is well-developed. She is not diaphoretic.     Comments: Well-appearing, comfortable, cooperative  HENT:     Head: Normocephalic and atraumatic.  Eyes:     General:        Right eye: No discharge.        Left eye: No discharge.     Conjunctiva/sclera: Conjunctivae normal.  Cardiovascular:     Rate and Rhythm: Normal rate.  Pulmonary:     Effort: Pulmonary effort is normal.  Skin:    General:  Skin is warm and dry.     Findings: Lesion (hand with some mild erythema from ant bite) and rash (residual post herpetic rash skin changes present on low back L side) present. No erythema.  Neurological:     Mental Status: She is alert and oriented to person, place, and time.  Psychiatric:        Mood and Affect: Mood normal.        Behavior: Behavior normal.        Thought Content: Thought content normal.     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  For Ant bite - use topical Triamcinolone 0.5% cream twice a day as needed for spot treatment  Re order Omeprazole (prilosec) for stomach acid twice a day  Nerve pain from Shingles can persist longer.    Meds ordered this encounter  Medications   omeprazole (PRILOSEC) 20 MG capsule    Sig: Take 1 capsule (20 mg total) by mouth 2 (two) times daily before a meal.    Dispense:  180 capsule    Refill:  3   triamcinolone cream (KENALOG) 0.5 %    Sig: Apply 1 Application topically 2 (two) times daily as needed (allergic reaction on skin swelling). To affected areas, for up to 2 weeks.    Dispense:  30 g    Refill:  2   sulfamethoxazole-trimethoprim (BACTRIM DS) 800-160 MG tablet    Sig: Take 1 tablet by mouth 2 (two) times daily for 7 days.    Dispense:  14 tablet    Refill:  0     Follow up plan: Return if symptoms worsen or fail to improve.   [name], DO [facility] 04/20/2023, 2:32 PM

## 2023-04-20 NOTE — Progress Notes (Deleted)
Subjective:    Patient ID: Brandi [name], female    DOB: 1956/11/30, 67 y.o.   MRN: [id number]  HPI  Pt presents to the clinic today with c/o ant bites.  She also reports congestion. She was treated for sinusitis 4/19 with Doxycycline. She has a history of COPD managed on Montelukast, Albuterol and Trelegy.  Review of Systems     Past Medical History:  Diagnosis Date   COPD (chronic obstructive pulmonary disease) (HCC)    Hyperlipidemia     Current Outpatient Medications  Medication Sig Dispense Refill   albuterol (PROVENTIL) (2.5 MG/3ML) 0.083% nebulizer solution USE 1 VIAL VIA NEBULIZER EVERY 6 HOURS AS NEEDED FOR WHEEZING OR SHORTNESS OF BREATH 75 mL 12   albuterol (VENTOLIN HFA) 108 (90 Base) MCG/ACT inhaler INHALE 2 PUFFS INTO THE LUNGS EVERY 4 HOURS AS NEEDED FOR WHEEZING OR SHORTNESS OF BREATH 8.5 g 2   aspirin EC 81 MG tablet Take 1 tablet (81 mg total) by mouth daily. 90 tablet 3   busPIRone (BUSPAR) 5 MG tablet TAKE 1 TABLET BY MOUTH TWICE DAILY AS NEEDED FOR ANXIETY 60 tablet 2   Calcium Carbonate-Vit D-Min (CALCIUM 1200) 1200-1000 MG-UNIT CHEW Chew 1,200 mg by mouth daily. 30 each 11   Cholecalciferol (VITAMIN D3) 125 MCG (5000 UT) TABS Take by mouth. Vitamin D3 5,000 IU daily for 12 weeks then reduce to OTC Vitamin D3 2,000 IU daily for maintenance     doxycycline (VIBRA-TABS) 100 MG tablet Take 1 tablet (100 mg total) by mouth 2 (two) times daily. For 10 days. Take with full glass of water, stay upright 30 min after taking. 20 tablet 0   ezetimibe (ZETIA) 10 MG tablet Take 1 tablet (10 mg total) by mouth daily. 90 tablet 3   famotidine (PEPCID) 20 MG tablet Take 1 tablet (20 mg total) by mouth 2 (two) times daily as needed. 180 tablet 1   feeding supplement (ENSURE ENLIVE / ENSURE PLUS) LIQD Take 237 mLs by mouth 2 (two) times daily between meals. 237 mL 12   Fluticasone-Umeclidin-Vilant (TRELEGY ELLIPTA) 200-62.5-25 MCG/ACT AEPB Inhale 1 puff into the lungs daily. 60  each 5   mometasone (ELOCON) 0.1 % lotion Apply topically.     montelukast (SINGULAIR) 10 MG tablet Take 1 tablet (10 mg total) by mouth at bedtime. 90 tablet 0   Multiple Vitamin (MULTIVITAMIN WITH MINERALS) TABS tablet Take 1 tablet by mouth daily.     omeprazole (PRILOSEC) 20 MG capsule Take 1 capsule (20 mg total) by mouth 2 (two) times daily before a meal. 180 capsule 3   ondansetron (ZOFRAN-ODT) 4 MG disintegrating tablet Take 1 tablet (4 mg total) by mouth every 8 (eight) hours as needed for nausea or vomiting. 30 tablet 0   OXYGEN Inhale 3 L into the lungs at bedtime as needed.     roflumilast (DALIRESP) 500 MCG TABS tablet Start 1 tablet every other day for 1 week then 1 tablet daily. (Patient not taking: Reported on 01/05/2023) 30 tablet 3   rosuvastatin (CRESTOR) 10 MG tablet Take 1 tablet (10 mg total) by mouth daily. (Patient not taking: Reported on 03/26/2023) 90 tablet 3   sodium chloride (OCEAN) 0.65 % SOLN nasal spray Place 1 spray into both nostrils as needed for congestion.     Specialty Vitamins Products (HAIR NOURISHING SUPPLEMENT PO) Take 1 tablet by mouth daily.     tiZANidine (ZANAFLEX) 2 MG tablet Take 1 tablet (2 mg total) by mouth every 8 (eight)  hours as needed for muscle spasms. 60 tablet 2   valACYclovir (VALTREX) 1000 MG tablet Take 1 tablet (1,000 mg total) by mouth 3 (three) times daily. For shingles 21 tablet 0   No current facility-administered medications for this visit.    No Known Allergies  Family History  Problem Relation Age of Onset   Breast cancer Mother 53   Alzheimer's disease Mother     Social History   Socioeconomic History   Marital status: Widowed    Spouse name: Not on file   Number of children: 1   Years of education: Not on file   Highest education level: Not on file  Occupational History   Occupation: Retired  Tobacco Use   Smoking status: Former    Packs/day: 0.75    Years: 41.00    Additional pack years: 0.00    Total pack  years: 30.75    Types: Cigarettes    Quit date: 12/08/2015    Years since quitting: 7.3   Smokeless tobacco: Never  Vaping Use   Vaping Use: Never used  Substance and Sexual Activity   Alcohol use: No   Drug use: No   Sexual activity: Yes    Birth control/protection: Post-menopausal  Other Topics Concern   Not on file  Social History Narrative   Not on file   Social Determinants of Health   Financial Resource Strain: Medium Risk (03/03/2023)   Overall Financial Resource Strain (CARDIA)    Difficulty of Paying Living Expenses: Somewhat hard  Food Insecurity: Food Insecurity Present (03/03/2023)   Hunger Vital Sign    Worried About Running Out of Food in the Last Year: Sometimes true    Ran Out of Food in the Last Year: Sometimes true  Transportation Needs: No Transportation Needs (03/03/2023)   PRAPARE - Administrator, Civil Service (Medical): No    Lack of Transportation (Non-Medical): No  Physical Activity: Sufficiently Active (03/03/2023)   Exercise Vital Sign    Days of Exercise per Week: 7 days    Minutes of Exercise per Session: 30 min  Stress: Stress Concern Present (03/03/2023)   [employer] of Occupational Health - Occupational Stress Questionnaire    Feeling of Stress : Rather much  Social Connections: Moderately Isolated (03/03/2023)   Social Connection and Isolation Panel [NHANES]    Frequency of Communication with Friends and Family: More than three times a week    Frequency of Social Gatherings with Friends and Family: More than three times a week    Attends Religious Services: More than 4 times per year    Active Member of [employer] or Organizations: No    Attends Banker Meetings: Never    Marital Status: Widowed  Intimate Partner Violence: Not At Risk (03/03/2023)   Humiliation, Afraid, Rape, and Kick questionnaire    Fear of Current or Ex-Partner: No    Emotionally Abused: No    Physically Abused: No    Sexually Abused: No      Constitutional: Denies fever, malaise, fatigue, headache or abrupt weight changes.  HEENT: Denies eye pain, eye redness, ear pain, ringing in the ears, wax buildup, runny nose, nasal congestion, bloody nose, or sore throat. Respiratory: Denies difficulty breathing, shortness of breath, cough or sputum production.   Cardiovascular: Denies chest pain, chest tightness, palpitations or swelling in the hands or feet.  Gastrointestinal: Denies abdominal pain, bloating, constipation, diarrhea or blood in the stool.  GU: Denies urgency, frequency, pain with urination, burning  sensation, blood in urine, odor or discharge. Musculoskeletal: Denies decrease in range of motion, difficulty with gait, muscle pain or joint pain and swelling.  Skin: Pt reports ant bites. Denies rashes, or ulcercations.  Neurological: Denies dizziness, difficulty with memory, difficulty with speech or problems with balance and coordination.  Psych: Denies anxiety, depression, SI/HI.  No other specific complaints in a complete review of systems (except as listed in HPI above).  Objective:   Physical Exam   There were no vitals taken for this visit. Wt Readings from Last 3 Encounters:  03/26/23 118 lb 12.8 oz (53.9 kg)  03/10/23 119 lb (54 kg)  03/01/23 122 lb (55.3 kg)    General: Appears their stated age, well developed, well nourished in NAD. Skin: Warm, dry and intact. No rashes, lesions or ulcerations noted. HEENT: Head: normal shape and size; Eyes: sclera white, no icterus, conjunctiva pink, PERRLA and EOMs intact; Ears: Tm's gray and intact, normal light reflex; Nose: mucosa pink and moist, septum midline; Throat/Mouth: Teeth present, mucosa pink and moist, no exudate, lesions or ulcerations noted.  Neck:  Neck supple, trachea midline. No masses, lumps or thyromegaly present.  Cardiovascular: Normal rate and rhythm. S1,S2 noted.  No murmur, rubs or gallops noted. No JVD or BLE edema. No carotid bruits  noted. Pulmonary/Chest: Normal effort and positive vesicular breath sounds. No respiratory distress. No wheezes, rales or ronchi noted.  Abdomen: Soft and nontender. Normal bowel sounds. No distention or masses noted. Liver, spleen and kidneys non palpable. Musculoskeletal: Normal range of motion. No signs of joint swelling. No difficulty with gait.  Neurological: Alert and oriented. Cranial nerves II-XII grossly intact. Coordination normal.  Psychiatric: Mood and affect normal. Behavior is normal. Judgment and thought content normal.    BMET    Component Value Date/Time   NA 138 03/01/2023 1241   NA 140 11/17/2021 1239   K 3.7 03/01/2023 1241   CL 102 03/01/2023 1241   CO2 28 03/01/2023 1241   GLUCOSE 118 (H) 03/01/2023 1241   BUN 12 03/01/2023 1241   BUN 12 11/17/2021 1239   CREATININE 0.61 03/01/2023 1241   CREATININE 0.58 01/20/2021 0801   CALCIUM 9.2 03/01/2023 1241   GFRNONAA >60 03/01/2023 1241   GFRNONAA 97 01/20/2021 0801   GFRAA 112 01/20/2021 0801    Lipid Panel     Component Value Date/Time   CHOL 165 12/22/2021 0952   TRIG 64 12/22/2021 0952   HDL 63 12/22/2021 0952   CHOLHDL 2.6 12/22/2021 0952   CHOLHDL 3.1 01/20/2021 0801   LDLCALC 89 12/22/2021 0952   LDLCALC 111 (H) 01/20/2021 0801    CBC    Component Value Date/Time   WBC 7.5 03/01/2023 1241   RBC 4.31 03/01/2023 1241   HGB 13.8 03/01/2023 1241   HGB 14.1 11/10/2018 1043   HCT 42.5 03/01/2023 1241   HCT 40.3 11/10/2018 1043   PLT 288 03/01/2023 1241   PLT 287 11/10/2018 1043   MCV 98.6 03/01/2023 1241   MCV 92 11/10/2018 1043   MCH 32.0 03/01/2023 1241   MCHC 32.5 03/01/2023 1241   RDW 12.5 03/01/2023 1241   RDW 11.8 (L) 11/10/2018 1043   LYMPHSABS 1.1 03/01/2023 1241   MONOABS 0.8 03/01/2023 1241   EOSABS 0.3 03/01/2023 1241   BASOSABS 0.1 03/01/2023 1241    Hgb A1C Lab Results  Component Value Date   HGBA1C 5.8 (H) 01/20/2021           Assessment & Plan:  Follow up  with your PCP as previously scheduled Nicki Reaper, NP

## 2023-04-21 ENCOUNTER — Encounter: Payer: Self-pay | Admitting: Family Medicine

## 2023-04-23 DIAGNOSIS — J449 Chronic obstructive pulmonary disease, unspecified: Secondary | ICD-10-CM | POA: Diagnosis not present

## 2023-04-26 ENCOUNTER — Telehealth: Payer: No Typology Code available for payment source

## 2023-04-26 ENCOUNTER — Ambulatory Visit (INDEPENDENT_AMBULATORY_CARE_PROVIDER_SITE_OTHER): Payer: No Typology Code available for payment source

## 2023-04-26 DIAGNOSIS — R7303 Prediabetes: Secondary | ICD-10-CM

## 2023-04-26 DIAGNOSIS — J432 Centrilobular emphysema: Secondary | ICD-10-CM

## 2023-04-26 DIAGNOSIS — E782 Mixed hyperlipidemia: Secondary | ICD-10-CM

## 2023-04-26 NOTE — Patient Instructions (Signed)
Please call the care guide team at [phone] if you need to cancel or reschedule your appointment.   If you are experiencing a Mental Health or Behavioral Health Crisis or need someone to talk to, please call the Suicide and Crisis Lifeline: 988 call the [place] National Suicide Prevention Lifeline: 2167784541 or TTY: 641-416-6661 TTY 203-728-4823) to talk to a trained counselor call 1-800-273-TALK (toll free, 24 hour hotline)   Following is a copy of the CCM Program Consent:  CCM service includes personalized support from designated clinical staff supervised by the physician, including individualized plan of care and coordination with other care providers 24/7 contact phone numbers for assistance for urgent and routine care needs. Service will only be billed when office clinical staff spend 20 minutes or more in a month to coordinate care. Only one practitioner may furnish and bill the service in a calendar month. The patient may stop CCM services at amy time (effective at the end of the month) by phone call to the office staff. The patient will be responsible for cost sharing (co-pay) or up to 20% of the service fee (after annual deductible is met)  Following is a copy of your full provider care plan:   Goals Addressed             This Visit's Progress    CCM Expected Outcome:  Monitor, Self-Manage and Reduce Symptoms of  COPD Emphysema       Current Barriers:  Knowledge Deficits related to the need for regular follow up for COPD and other chronic conditions Care Coordination needs related to expressed needs of the patient with COPD and the need for collaboration with the providers in a patient with COPD Chronic Disease Management support and education needs related to effective management of COPD  Planned Interventions: Provided patient with basic written and verbal COPD education on self care/management/and exacerbation prevention. The patient states when it is cooler weather  she does better. States her breathing is stable right now. Denies any acute findings today.  Advised patient to track and manage COPD triggers. The patient states she is doing better with her breathing since seeing her pulmonary provider. Oxygen saturation was 97% while on the phone and it was good in the office last week. She states the inhalers she is using now are very good for her. The patient states that she is monitoring for changes. When it is hot outside that is when she has the worst of problems. Education on pacing activity and monitoring for triggers that exacerbate  her condition.  Provided written and verbal instructions on pursed lip breathing and utilized returned demonstration as teach back Provided instruction about proper use of medications used for management of COPD including inhalers. Has just switched from [facility] to Trelegy and has her inhalers. Uses oxygen at night and when needed. States that the Trelegy has worked well for her. Denies any issues with getting her medications.  Advised patient to self assesses COPD action plan zone and make appointment with provider if in the yellow zone for 48 hours without improvement Advised patient to engage in light exercise as tolerated 3-5 days a week to aid in the the management of COPD Provided education about and advised patient to utilize infection prevention strategies to reduce risk of respiratory infection. Education on taking all medications as directed. She saw pcp last week for ant bite and she states her shingles are still giving her some issues when she is out in the heat. Education and support  provided.  Discussed the importance of adequate rest and management of fatigue with COPD. Education on pacing activity and monitoring for factors that exacerbate her condition.  Screening for signs and symptoms of depression related to chronic disease state  Assessed social determinant of health barriers  Symptom Management: Take  medications as prescribed   Attend all scheduled provider appointments Call provider office for new concerns or questions  call the Suicide and Crisis Lifeline: 988 call the [place] National Suicide Prevention Lifeline: 806-485-4422 or TTY: 220-524-5591 TTY (540)726-6285) to talk to a trained counselor call 1-800-273-TALK (toll free, 24 hour hotline) if experiencing a Mental Health or Behavioral Health Crisis  avoid second hand smoke eliminate smoking in my home identify and avoid work-related triggers identify and remove indoor air pollutants limit outdoor activity during cold weather listen for public air quality announcements every day do breathing exercises every day develop a rescue plan eliminate symptom triggers at home follow rescue plan if symptoms flare-up  Follow Up Plan: Telephone follow up appointment with care management team member scheduled for: 06-14-2023 at 0945 am       CCM Expected outcome:  Monitor, Self-Manage and Reduce Symptoms of HLD       Current Barriers:  Knowledge Deficits related to the importance of regular visits to see the provider for effective management of HLD and other chronic conditions Chronic Disease Management support and education needs related to effective management of HLD Lab Results  Component Value Date   CHOL 165 12/22/2021   HDL 63 12/22/2021   LDLCALC 89 12/22/2021   TRIG 64 12/22/2021   CHOLHDL 2.6 12/22/2021    Planned Interventions: Provider established cholesterol goals reviewed. Needs new labwork done. Education and support given; Counseled on importance of regular laboratory monitoring as prescribed. The patients labs are stable. Education on needing to get updated lipid panel. The patient has seen the provider recently but not for labs. Will continue to monitor for changes. ; Provided HLD educational materials; Reviewed role and benefits of statin for ASCVD risk reduction. Takes Zetia 10 mg QD and rosuvastatin 10 mg Q. Denies  any issues with medication compliance; Discussed strategies to manage statin-induced myalgias; Reviewed importance of limiting foods high in cholesterol; Reviewed exercise goals and target of 150 minutes per week; Screening for signs and symptoms of depression related to chronic disease state;  Assessed social determinant of health barriers;   Symptom Management: Take medications as prescribed   Attend all scheduled provider appointments Call provider office for new concerns or questions  call the Suicide and Crisis Lifeline: 988 call the [place] National Suicide Prevention Lifeline: 719-657-7418 or TTY: (947) 413-5183 TTY 6783047472) to talk to a trained counselor call 1-800-273-TALK (toll free, 24 hour hotline) if experiencing a Mental Health or Behavioral Health Crisis  - call for medicine refill 2 or 3 days before it runs out - take all medications exactly as prescribed - call doctor with any symptoms you believe are related to your medicine - call doctor when you experience any new symptoms - go to all doctor appointments as scheduled  Follow Up Plan: Telephone follow up appointment with care management team member scheduled for: 06-14-2023 at 0945 am        CCM Expected Outcome:  Monitor, Self-Manage and Reduce Symptoms of: Pre Diabetes       Current Barriers:  Knowledge Deficits related to the importance of regular follow up for Chronic Disease Management and Care Coordination Needs and effective management of pre- DM Care Coordination  needs related to food resources and educational needs  in a patient with DM Chronic Disease Management support and education needs related to effective management of DM Lab Results  Component Value Date   HGBA1C 5.8 (H) 01/20/2021     Planned Interventions: Provided education to patient about basic DM disease process; Reviewed medications with patient and discussed importance of medication adherence. The patient is compliant with medications.  Does not want to take prednisone at this causes her issues;        Reviewed prescribed diet with patient heart healthy/ADA diet. The patient states sometimes she has issues with her blood sugars getting low; Counseled on importance of regular laboratory monitoring as prescribed. Review of the need to get updated A1C;        Discussed plans with patient for ongoing care management follow up and provided patient with direct contact information for care management team;      Provided patient with written educational materials related to hypo and hyperglycemia and importance of correct treatment;       Reviewed scheduled/upcoming provider appointments including: Saw pcp recently for ant bites. Has not had new blood work. Education and support provided.  call provider for findings outside established parameters;       Referral made to community resources care guide team for assistance with food resources due to financial strain;      Review of patient status, including review of consultants reports, relevant laboratory and other test results, and medications completed;       Advised patient to discuss changes in her DM, questions, concerns, and the need for new labwork with provider;      Screening for signs and symptoms of depression related to chronic disease state;        Assessed social determinant of health barriers;        The patient currently has shingles. Her shingles are better. She states that she does still have some flare ups in her symptoms. Discussed how it takes time for the shingles to get better and improve. The patient verbalized understanding.   Symptom Management: Take medications as prescribed   Attend all scheduled provider appointments Call provider office for new concerns or questions  call the Suicide and Crisis Lifeline: 988 call the [place] National Suicide Prevention Lifeline: 774-274-6396 or TTY: (440)296-7274 TTY 240-507-6676) to talk to a trained counselor call  1-800-273-TALK (toll free, 24 hour hotline) if experiencing a Mental Health or Behavioral Health Crisis  check feet daily for cuts, sores or redness trim toenails straight across wash and dry feet carefully every day wear comfortable, cotton socks wear comfortable, well-fitting shoes  Follow Up Plan: Telephone follow up appointment with care management team member scheduled for: 07-04-2023 at 0945 am          Patient verbalizes understanding of instructions and care plan provided today and agrees to view in MyChart. Active MyChart status and patient understanding of how to access instructions and care plan via MyChart confirmed with patient.  Telephone follow up appointment with care management team member scheduled for: 06-14-2023 at 0945 am

## 2023-04-26 NOTE — Chronic Care Management (AMB) (Signed)
Chronic Care Management   CCM RN Visit Note  04/26/2023 Name: Brandi [name] MRN: [id number] DOB: Feb 27, 1956  Subjective: Brandi [name] is a 67 y.o. year old female who is a primary care patient of [name], DO. The patient was referred to the Chronic Care Management team for assistance with care management needs subsequent to provider initiation of CCM services and plan of care.    Today's Visit:  Engaged with patient by telephone for follow up visit.        Goals Addressed             This Visit's Progress    CCM Expected Outcome:  Monitor, Self-Manage and Reduce Symptoms of  COPD Emphysema       Current Barriers:  Knowledge Deficits related to the need for regular follow up for COPD and other chronic conditions Care Coordination needs related to expressed needs of the patient with COPD and the need for collaboration with the providers in a patient with COPD Chronic Disease Management support and education needs related to effective management of COPD  Planned Interventions: Provided patient with basic written and verbal COPD education on self care/management/and exacerbation prevention. The patient states when it is cooler weather she does better. States her breathing is stable right now. Denies any acute findings today.  Advised patient to track and manage COPD triggers. The patient states she is doing better with her breathing since seeing her pulmonary provider. Oxygen saturation was 97% while on the phone and it was good in the office last week. She states the inhalers she is using now are very good for her. The patient states that she is monitoring for changes. When it is hot outside that is when she has the worst of problems. Education on pacing activity and monitoring for triggers that exacerbate  her condition.  Provided written and verbal instructions on pursed lip breathing and utilized returned demonstration as teach back Provided instruction about proper  use of medications used for management of COPD including inhalers. Has just switched from [facility] to Trelegy and has her inhalers. Uses oxygen at night and when needed. States that the Trelegy has worked well for her. Denies any issues with getting her medications.  Advised patient to self assesses COPD action plan zone and make appointment with provider if in the yellow zone for 48 hours without improvement Advised patient to engage in light exercise as tolerated 3-5 days a week to aid in the the management of COPD Provided education about and advised patient to utilize infection prevention strategies to reduce risk of respiratory infection. Education on taking all medications as directed. She saw pcp last week for ant bite and she states her shingles are still giving her some issues when she is out in the heat. Education and support provided.  Discussed the importance of adequate rest and management of fatigue with COPD. Education on pacing activity and monitoring for factors that exacerbate her condition.  Screening for signs and symptoms of depression related to chronic disease state  Assessed social determinant of health barriers  Symptom Management: Take medications as prescribed   Attend all scheduled provider appointments Call provider office for new concerns or questions  call the Suicide and Crisis Lifeline: 988 call the [place] National Suicide Prevention Lifeline: 779-284-0156 or TTY: 854-339-5609 TTY (705) 660-7314) to talk to a trained counselor call 1-800-273-TALK (toll free, 24 hour hotline) if experiencing a Mental Health or Behavioral Health Crisis  avoid second hand smoke eliminate smoking in my  home identify and avoid work-related triggers identify and remove indoor air pollutants limit outdoor activity during cold weather listen for public air quality announcements every day do breathing exercises every day develop a rescue plan eliminate symptom triggers at home follow  rescue plan if symptoms flare-up  Follow Up Plan: Telephone follow up appointment with care management team member scheduled for: 06-14-2023 at 0945 am       CCM Expected outcome:  Monitor, Self-Manage and Reduce Symptoms of HLD       Current Barriers:  Knowledge Deficits related to the importance of regular visits to see the provider for effective management of HLD and other chronic conditions Chronic Disease Management support and education needs related to effective management of HLD Lab Results  Component Value Date   CHOL 165 12/22/2021   HDL 63 12/22/2021   LDLCALC 89 12/22/2021   TRIG 64 12/22/2021   CHOLHDL 2.6 12/22/2021    Planned Interventions: Provider established cholesterol goals reviewed. Needs new labwork done. Education and support given; Counseled on importance of regular laboratory monitoring as prescribed. The patients labs are stable. Education on needing to get updated lipid panel. The patient has seen the provider recently but not for labs. Will continue to monitor for changes. ; Provided HLD educational materials; Reviewed role and benefits of statin for ASCVD risk reduction. Takes Zetia 10 mg QD and rosuvastatin 10 mg Q. Denies any issues with medication compliance; Discussed strategies to manage statin-induced myalgias; Reviewed importance of limiting foods high in cholesterol; Reviewed exercise goals and target of 150 minutes per week; Screening for signs and symptoms of depression related to chronic disease state;  Assessed social determinant of health barriers;   Symptom Management: Take medications as prescribed   Attend all scheduled provider appointments Call provider office for new concerns or questions  call the Suicide and Crisis Lifeline: 988 call the [place] National Suicide Prevention Lifeline: 321-643-6503 or TTY: 305-102-8311 TTY (365)150-7015) to talk to a trained counselor call 1-800-273-TALK (toll free, 24 hour hotline) if experiencing a  Mental Health or Behavioral Health Crisis  - call for medicine refill 2 or 3 days before it runs out - take all medications exactly as prescribed - call doctor with any symptoms you believe are related to your medicine - call doctor when you experience any new symptoms - go to all doctor appointments as scheduled  Follow Up Plan: Telephone follow up appointment with care management team member scheduled for: 06-14-2023 at 0945 am        CCM Expected Outcome:  Monitor, Self-Manage and Reduce Symptoms of: Pre Diabetes       Current Barriers:  Knowledge Deficits related to the importance of regular follow up for Chronic Disease Management and Care Coordination Needs and effective management of pre- DM Care Coordination needs related to food resources and educational needs  in a patient with DM Chronic Disease Management support and education needs related to effective management of DM Lab Results  Component Value Date   HGBA1C 5.8 (H) 01/20/2021     Planned Interventions: Provided education to patient about basic DM disease process; Reviewed medications with patient and discussed importance of medication adherence. The patient is compliant with medications. Does not want to take prednisone at this causes her issues;        Reviewed prescribed diet with patient heart healthy/ADA diet. The patient states sometimes she has issues with her blood sugars getting low; Counseled on importance of regular laboratory monitoring as prescribed. Review of  the need to get updated A1C;        Discussed plans with patient for ongoing care management follow up and provided patient with direct contact information for care management team;      Provided patient with written educational materials related to hypo and hyperglycemia and importance of correct treatment;       Reviewed scheduled/upcoming provider appointments including: Saw pcp recently for ant bites. Has not had new blood work. Education and support  provided.  call provider for findings outside established parameters;       Referral made to community resources care guide team for assistance with food resources due to financial strain;      Review of patient status, including review of consultants reports, relevant laboratory and other test results, and medications completed;       Advised patient to discuss changes in her DM, questions, concerns, and the need for new labwork with provider;      Screening for signs and symptoms of depression related to chronic disease state;        Assessed social determinant of health barriers;        The patient currently has shingles. Her shingles are better. She states that she does still have some flare ups in her symptoms. Discussed how it takes time for the shingles to get better and improve. The patient verbalized understanding.   Symptom Management: Take medications as prescribed   Attend all scheduled provider appointments Call provider office for new concerns or questions  call the Suicide and Crisis Lifeline: 988 call the [place] National Suicide Prevention Lifeline: (820)450-3262 or TTY: 667-644-9661 TTY (207) 276-2633) to talk to a trained counselor call 1-800-273-TALK (toll free, 24 hour hotline) if experiencing a Mental Health or Behavioral Health Crisis  check feet daily for cuts, sores or redness trim toenails straight across wash and dry feet carefully every day wear comfortable, cotton socks wear comfortable, well-fitting shoes  Follow Up Plan: Telephone follow up appointment with care management team member scheduled for: 07-04-2023 at 0945 am          Plan:Telephone follow up appointment with care management team member scheduled for:  06-14-2023 at 0945 am  Alto Denver RN, MSN, CCM RN Care Manager  Chronic Care Management Direct Number: [phone]

## 2023-04-29 ENCOUNTER — Encounter: Payer: Self-pay | Admitting: Emergency Medicine

## 2023-04-29 ENCOUNTER — Encounter: Payer: No Typology Code available for payment source | Admitting: Internal Medicine

## 2023-04-29 ENCOUNTER — Ambulatory Visit
Admission: EM | Admit: 2023-04-29 | Discharge: 2023-04-29 | Disposition: A | Discharge status: Home / Self Care | Payer: No Typology Code available for payment source | Attending: Family Medicine | Admitting: Family Medicine

## 2023-04-29 ENCOUNTER — Ambulatory Visit: Payer: Self-pay

## 2023-04-29 DIAGNOSIS — J439 Emphysema, unspecified: Secondary | ICD-10-CM

## 2023-04-29 DIAGNOSIS — J441 Chronic obstructive pulmonary disease with (acute) exacerbation: Secondary | ICD-10-CM

## 2023-04-29 MED ORDER — LEVOFLOXACIN 750 MG PO TABS: 750.0000 mg | ORAL_TABLET | Freq: Every day | ORAL | 0 refills | Status: AC

## 2023-04-29 MED ORDER — PREDNISONE 10 MG (21) PO TBPK: ORAL_TABLET | Freq: Every day | ORAL | 0 refills | Status: DC

## 2023-04-29 NOTE — Telephone Encounter (Signed)
Pt was schedule for a virtual visit today at 2:20.  She was not able to get her phone to connect to the visit.  I advised her it would be better if she was evaluated at an Urgent Care.  We did not have any appointments left this afternoon.  She refused at first but I advised her that since she was coughing up blood it would be better to be seen today.  She agreed to go.    Thanks   -Vernona Rieger

## 2023-04-29 NOTE — Telephone Encounter (Signed)
  Chief Complaint: coughing up blood  Symptoms: SOB with walking , thick green phlegm, coughing up chinks of blood  Frequency: 2 days  Pertinent Negatives: Patient denies fever Disposition: [] ED /[] Urgent Care (no appt availability in office) / [x] Appointment(In office/virtual)/ []  [facility]/ [] Home Care/ [] Refused Recommended Disposition /[] [facility]/ []  Follow-up with PCP Additional Notes: called [facility] and appt made for pt for VV. While assisting pt to log in to MyChart pt stated she was getting a call from the doctors office. Call ended.  Reason for Disposition  [1] Coughed up blood AND [2] > 1 tablespoon (15 ml)  Answer Assessment - Initial Assessment Questions 1. ONSET: "When did the cough begin?"      2 days  2. SEVERITY: "How bad is the cough today?" "Did the blood appear after a coughing spell?"      Sporadic- no 3. SPUTUM: "Describe the color of your sputum" (none, dry cough; clear, white, yellow, green)     Green mixed in  4. HEMOPTYSIS: "How much blood?" (flecks, streaks, tablespoons, etc.)     1/2 inch long  5. DIFFICULTY BREATHING: "Are you having difficulty breathing?" If Yes, ask: "How bad is it?" (e.g., mild, moderate, severe)    - MILD: No SOB at rest, mild SOB with walking, speaks normally in sentences, can lie down, no retractions, pulse < 100.    - MODERATE: SOB at rest, SOB with minimal exertion and prefers to sit, cannot lie down flat, speaks in phrases, mild retractions, audible wheezing, pulse 100-120.    - SEVERE: Very SOB at rest, speaks in single words, struggling to breathe, sitting hunched forward, retractions, pulse > 120      Mild  6. FEVER: "Do you have a fever?" If Yes, ask: "What is your temperature, how was it measured, and when did it start?"     denies 7. CARDIAC HISTORY: "Do you have any history of heart disease?" (e.g., heart attack, congestive heart failure)      N/a 8. LUNG HISTORY: "Do you have any history of lung  disease?"  (e.g., pulmonary embolus, asthma, emphysema)     Recent pneumonia 9. PE RISK FACTORS: "Do you have a history of blood clots?" (or: recent major surgery, recent prolonged travel, bedridden)     N/a 10. OTHER SYMPTOMS: "Do you have any other symptoms?" (e.g., runny nose, wheezing, chest pain)       Sinus drainage, coughing up green thick phelgm 11. PREGNANCY: "Is there any chance you are pregnant?" "When was your last menstrual period?"       N/a 12. TRAVEL: "Have you traveled out of the country in the last month?" (e.g., travel history, exposures)       N/a  Protocols used: Coughing Up Blood-A-AH

## 2023-04-29 NOTE — Discharge Instructions (Signed)
Stop by the pharmacy to pick up your prescriptions.  Follow up with your lung doctor as needed.

## 2023-04-29 NOTE — ED Triage Notes (Signed)
Pt presents with a productive cough spitting up blood tinged mucus x 2 days

## 2023-04-29 NOTE — Progress Notes (Signed)
Pt unable to connect to video visit

## 2023-04-29 NOTE — ED Provider Notes (Signed)
[facility]    CSN: [id number] Arrival date & time: 04/29/23  1504      History   Chief Complaint Chief Complaint  Patient presents with   Cough   Nasal Congestion    HPI Brandi [name] is a 67 y.o. female.   HPI  History History obtained from the patient. Brandi [name] presents for productive cough with blood tinged mucus for the past 2 days. Has nasal congestion, sinus pressure. She has been becoming more short of breathe. Follows with pulmonology. 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  Needs to have repeat PFTs and CT Chest but hasn't felt well enough to get these tests done. Called her PCP who directed her to the urgent care as she was not able to connect for a virtual visit.      Past Medical History:  Diagnosis Date   COPD (chronic obstructive pulmonary disease) (HCC)    Hyperlipidemia     Patient Active Problem List   Diagnosis Date Noted   Pre-diabetes 04/08/2021   Vitamin D deficiency 04/08/2021   COVID-19 vaccination declined 12/04/2020   Centrilobular emphysema (HCC) 04/08/2020   Nodule of upper lobe of left lung 04/08/2020   Aortic atherosclerosis (HCC) 05/21/2019   Coronary artery calcification seen on CT scan 05/21/2019   Former smoker 05/21/2019   Hyperlipidemia 05/21/2019    Past Surgical History:  Procedure Laterality Date   APPENDECTOMY     CHOLECYSTECTOMY     SHOULDER ARTHROSCOPY Left 07/02/2015   Procedure: ,left shoulder arthroscopy, decompression and debridement;  Surgeon: Christena Flake, MD;  Location: [facility] ORS;  Service: Orthopedics;  Laterality: Left;   SHOULDER CLOSED REDUCTION Left 04/09/2015   Procedure: CLOSED MANIPULATION SHOULDER;  Surgeon: Kennedy Bucker, MD;  Location: [facility] OR[facility];  Service: Orthopedics;  Laterality: Left;    OB History     Gravida  1   Para  1   Term  1   Preterm      AB      Living  1      SAB      IAB      Ectopic      Multiple      Live  Births  1            Home Medications    Prior to Admission medications   Medication Sig Start Date End Date Taking? Authorizing Provider  levofloxacin (LEVAQUIN) 750 MG tablet Take 1 tablet (750 mg total) by mouth daily for 5 days. 04/29/23 05/04/23 Yes Trevan Messman, DO  predniSONE (STERAPRED UNI-PAK 21 TAB) 10 MG (21) TBPK tablet Take by mouth daily. Take 6 tabs by mouth daily for 1, then 5 tabs for 1 day, then 4 tabs for 1 day, then 3 tabs for 1 day, then 2 tabs for 1 day, then 1 tab for 1 day. 04/29/23  Yes Madalee Altmann, DO  albuterol (PROVENTIL) (2.5 MG/3ML) 0.083% nebulizer solution USE 1 VIAL VIA NEBULIZER EVERY 6 HOURS AS NEEDED FOR WHEEZING OR SHORTNESS OF BREATH 03/18/23   [name], MD  albuterol (VENTOLIN HFA) 108 (90 Base) MCG/ACT inhaler INHALE 2 PUFFS INTO THE LUNGS EVERY 4 HOURS AS NEEDED FOR WHEEZING OR SHORTNESS OF BREATH 11/26/22   [name], [name], DO  aspirin EC 81 MG tablet Take 1 tablet (81 mg total) by mouth daily. 05/22/19   [name], MD  busPIRone (BUSPAR) 5 MG tablet TAKE 1 TABLET BY MOUTH  TWICE DAILY AS NEEDED FOR ANXIETY 11/26/22   [name], [name], DO  Calcium Carbonate-Vit D-Min (CALCIUM 1200) 1200-1000 MG-UNIT CHEW Chew 1,200 mg by mouth daily. 01/31/19   [name], Jaquelyn Bitter, MD  Cholecalciferol (VITAMIN D3) 125 MCG (5000 UT) TABS Take by mouth. Vitamin D3 5,000 IU daily for 12 weeks then reduce to OTC Vitamin D3 2,000 IU daily for maintenance    [provider]  ezetimibe (ZETIA) 10 MG tablet Take 1 tablet (10 mg total) by mouth daily. 04/15/21   [name], MD  famotidine (PEPCID) 20 MG tablet Take 1 tablet (20 mg total) by mouth 2 (two) times daily as needed. 03/10/23   [name], [name], DO  feeding supplement (ENSURE ENLIVE / ENSURE PLUS) LIQD Take 237 mLs by mouth 2 (two) times daily between meals. 12/23/20   [name], DO  Fluticasone-Umeclidin-Vilant (TRELEGY ELLIPTA) 200-62.5-25 MCG/ACT AEPB  Inhale 1 puff into the lungs daily. 03/18/23   [name], MD  mometasone (ELOCON) 0.1 % lotion Apply topically. 02/11/22   [provider]  montelukast (SINGULAIR) 10 MG tablet Take 1 tablet (10 mg total) by mouth at bedtime. 02/23/23   Lorre Munroe, NP  Multiple Vitamin (MULTIVITAMIN WITH MINERALS) TABS tablet Take 1 tablet by mouth daily. 12/24/20   [name], DO  omeprazole (PRILOSEC) 20 MG capsule Take 1 capsule (20 mg total) by mouth 2 (two) times daily before a meal. 04/20/23   [name], [name], DO  ondansetron (ZOFRAN-ODT) 4 MG disintegrating tablet Take 1 tablet (4 mg total) by mouth every 8 (eight) hours as needed for nausea or vomiting. 03/11/22   [name], [name], DO  OXYGEN Inhale 3 L into the lungs at bedtime as needed.    [provider]  roflumilast (DALIRESP) 500 MCG TABS tablet Start 1 tablet every other day for 1 week then 1 tablet daily. 11/12/22   [name], MD  rosuvastatin (CRESTOR) 10 MG tablet Take 1 tablet (10 mg total) by mouth daily. 02/09/22   [name], MD  sodium chloride (OCEAN) 0.65 % SOLN nasal spray Place 1 spray into both nostrils as needed for congestion.    [provider]  Specialty Vitamins Products (HAIR NOURISHING SUPPLEMENT PO) Take 1 tablet by mouth daily.    [provider]  tiZANidine (ZANAFLEX) 2 MG tablet Take 1 tablet (2 mg total) by mouth every 8 (eight) hours as needed for muscle spasms. 03/26/23   [name], [name], DO  triamcinolone cream (KENALOG) 0.5 % Apply 1 Application topically 2 (two) times daily as needed (allergic reaction on skin swelling). To affected areas, for up to 2 weeks. 04/20/23   [name], [name], DO    Family History Family History  Problem Relation Age of Onset   Breast cancer Mother 71   Alzheimer's disease Mother     Social History Social History   Tobacco Use   Smoking status: Former    Packs/day: 0.75    Years: 41.00     Additional pack years: 0.00    Total pack years: 30.75    Types: Cigarettes    Quit date: 12/08/2015    Years since quitting: 7.3   Smokeless tobacco: Never  Vaping Use   Vaping Use: Never used  Substance Use Topics   Alcohol use: No   Drug use: No     Allergies   Patient has no known allergies.   Review of Systems Review of Systems: negative unless  otherwise stated in HPI.      Physical Exam Triage Vital Signs ED Triage Vitals [04/29/23 1617]  Enc Vitals Group     BP 135/74     Pulse Rate 82     Resp 18     Temp 98.2 F (36.8 C)     Temp Source Oral     SpO2 94 %     Weight      Height      Head Circumference      Peak Flow      Pain Score 7     Pain Loc      Pain Edu?      Excl. in GC?    No data found.  Updated Vital Signs BP 135/74 (BP Location: Right Arm)   Pulse 82   Temp 98.2 F (36.8 C) (Oral)   Resp 18   SpO2 94%   Visual Acuity Right Eye Distance:   Left Eye Distance:   Bilateral Distance:    Right Eye Near:   Left Eye Near:    Bilateral Near:     Physical Exam GEN:     alert, non-toxic appearing female in no distress    HENT:  mucus membranes moist, oropharyngeal without lesions or erythema, no tonsillar hypertrophy or exudates,  moderate erythematous edematous turbinates, clear nasal discharge EYES:   pupils equal and reactive, no scleral injection or discharge  RESP:  no increased work of breathing, scattered coarse breathe sounds, no wheezing CVS:   regular rate and rhythm Skin:   warm and dry    UC Treatments / Results  Labs (all labs ordered are listed, but only abnormal results are displayed) Labs Reviewed - No data to display  EKG   Radiology No results found.  Procedures Procedures (including critical care time)  Medications Ordered in UC Medications - No data to display  Initial Impression / Assessment and Plan / UC Course  I have reviewed the triage vital signs and the nursing notes.  Pertinent labs &  imaging results that were available during my care of the patient were reviewed by me and considered in my medical decision making (see chart for details).       Pt is a 67 y.o. female emphysema and former smoker who presents for productive cough for the past 2 days. Brandi [name] is afebrile here without recent antipyretics. Satting adequately (94%) on room air. Overall pt is non-toxic appearing, well hydrated, without respiratory distress. Pulmonary exam is remarkable for scattered coarse breathe sounds with frequent cough. In March 2019 she was treated for community-acquired pneumonia and chest xray showed right middle lob infection. Reviewed recent PCP and pulmonology notes.   Treat acute emphysema flare with Levaquin and prednisone as below. Pt to follow up with her pulmonologist to discuss returning to [facility].   Discussed symptomatic treatment.    Typical duration of symptoms discussed.  Q6H nebulizer for dyspnea. Continue 4 L O2 at night.   Return and ED precautions given and voiced understanding. Discussed MDM, treatment plan and plan for follow-up with patient who agrees with plan.     Final Clinical Impressions(s) / UC Diagnoses   Final diagnoses:  Pulmonary emphysema, unspecified emphysema type (HCC)  COPD with acute exacerbation [facility])     Discharge Instructions      Stop by the pharmacy to pick up your prescriptions.  Follow up with your lung doctor as needed.      ED Prescriptions     Medication  Sig Dispense Auth. Provider   levofloxacin (LEVAQUIN) 750 MG tablet Take 1 tablet (750 mg total) by mouth daily for 5 days. 5 tablet Dominque Marlin, DO   predniSONE (STERAPRED UNI-PAK 21 TAB) 10 MG (21) TBPK tablet Take by mouth daily. Take 6 tabs by mouth daily for 1, then 5 tabs for 1 day, then 4 tabs for 1 day, then 3 tabs for 1 day, then 2 tabs for 1 day, then 1 tab for 1 day. 21 tablet Katha Cabal, DO      PDMP not reviewed this encounter.   Katha Cabal,  DO 04/29/23 1756

## 2023-05-03 ENCOUNTER — Telehealth: Payer: Self-pay | Admitting: Family Medicine

## 2023-05-03 MED ORDER — FLUCONAZOLE 150 MG PO TABS: 150.0000 mg | ORAL_TABLET | ORAL | 0 refills | Status: AC

## 2023-05-03 NOTE — Telephone Encounter (Signed)
Pt called to report yeast infection symptoms after taking antibiotics. It is reasonable request for Diflucan which was sent to her pharmacy.   Katha Cabal, DO

## 2023-05-06 ENCOUNTER — Telehealth: Payer: Self-pay | Admitting: Pulmonary Disease

## 2023-05-06 NOTE — Telephone Encounter (Signed)
Pt calling in to get scheduled for her PFT at [facility] and also needs a refill for trelegy  Pharmacy: [facility]

## 2023-05-07 DIAGNOSIS — E785 Hyperlipidemia, unspecified: Secondary | ICD-10-CM | POA: Diagnosis not present

## 2023-05-07 DIAGNOSIS — J449 Chronic obstructive pulmonary disease, unspecified: Secondary | ICD-10-CM | POA: Diagnosis not present

## 2023-05-07 MED ORDER — TRELEGY ELLIPTA 200-62.5-25 MCG/ACT IN AEPB: 1.0000 | INHALATION_SPRAY | Freq: Every day | RESPIRATORY_TRACT | 5 refills | Status: DC

## 2023-05-07 NOTE — Telephone Encounter (Signed)
Trelegy sent to [facility].  Lm for patient.   Synetta Fail, please schedule PFT. Thanks

## 2023-05-10 ENCOUNTER — Other Ambulatory Visit: Payer: Self-pay | Admitting: Family Medicine

## 2023-05-10 DIAGNOSIS — F4323 Adjustment disorder with mixed anxiety and depressed mood: Secondary | ICD-10-CM

## 2023-05-11 NOTE — Telephone Encounter (Signed)
Requested Prescriptions  Pending Prescriptions Disp Refills   busPIRone (BUSPAR) 5 MG tablet [Pharmacy Med Name: BUSPIRONE HCL 5 MG TAB] 180 tablet 1    Sig: TAKE 1 TABLET BY MOUTH TWICE DAILY AS NEEDED FOR ANXIETY     Psychiatry: Anxiolytics/Hypnotics - Non-controlled Passed - 05/10/2023  4:39 PM      Passed - Valid encounter within last 12 months    Recent Outpatient Visits           3 weeks ago Allergy to ant bite   [place] [facility] [place], [name], DO   1 month ago Post herpetic neuralgia   [place] [facility] [place], [name], DO   2 months ago Herpes zoster without complication   [place] [facility] [place], [name], DO   2 months ago Community acquired pneumonia of left lung, unspecified part of lung   [place] [facility] [place], Salvadore Oxford, NP   2 months ago Chronic hypoxemic respiratory failure [facility])    [facility] [place], Salvadore Oxford, NP       Future Appointments             In 2 months [name], MD [facility] at [facility]

## 2023-05-11 NOTE — Telephone Encounter (Signed)
Lm x2 for patient.  Will close encounter per office protocol.   

## 2023-05-12 NOTE — Telephone Encounter (Signed)
I finally got to speak with the patient and her PFT has been scheduled 05/25/23 @ 1:00pm [facility] Entrance and we rescheduled her CT for 05/25/23 @ 3:00pm at [facility] and she is aware

## 2023-05-22 ENCOUNTER — Ambulatory Visit
Admission: EM | Admit: 2023-05-22 | Discharge: 2023-05-22 | Disposition: A | Discharge status: Home / Self Care | Payer: No Typology Code available for payment source | Attending: Emergency Medicine | Admitting: Emergency Medicine

## 2023-05-22 DIAGNOSIS — R21 Rash and other nonspecific skin eruption: Secondary | ICD-10-CM | POA: Diagnosis not present

## 2023-05-22 DIAGNOSIS — R202 Paresthesia of skin: Secondary | ICD-10-CM

## 2023-05-22 MED ORDER — CETIRIZINE HCL 10 MG PO TABS: 10.0000 mg | ORAL_TABLET | Freq: Every day | ORAL | 0 refills | Status: DC

## 2023-05-22 NOTE — ED Triage Notes (Signed)
Patient presents with itching around right flank, states she had a tick bite almost 2 weeks ago. Pt reports tingling in her left jaw this morning.

## 2023-05-22 NOTE — Discharge Instructions (Addendum)
Recommend going to Er for evaluation of tingling in face(will need further imaging and workup, no CT/MRI at this facility).   Make sure to avoid early morning,late evening as insect activity is at peak. Use bug spray when outside, long sleeves/pants, apply triamcinolone cream you have at home on insect bites as prescribed,take zyrtec as prescribed. Wash hands frequently, trim nails,avoid scratching.  If you develop fever, streaking, worsening issues please follow up immediately with PCP or go to ER.

## 2023-05-22 NOTE — ED Provider Notes (Signed)
[facility]    CSN: [id number] Arrival date & time: 05/22/23  1133      History   Chief Complaint No chief complaint on file.   HPI Brandi [name] is a 67 y.o. female.   67 year old female, Brandi [name], presents to urgent care for evaluation of right upper quadrant rash x 3 days.  Patient states she awakened this morning and had tingling to the left side of her jaw which lasted a few minutes has gone away.  Patient denies any chest pain shortness of breath or palpitations.  States she googled her symptoms and came to the urgent care because she thinks she removed a small deer tick from that area 2 weeks prior and needs to get checked out for the tingling and make sure she does not have tick fever.  PMH medical history: Shingles(left posterior trunk), pneumonia, coronary artery disease, hyperlipidemia, former smoker  The history is provided by the patient. No language interpreter was used.    Past Medical History:  Diagnosis Date   COPD (chronic obstructive pulmonary disease) (HCC)    Hyperlipidemia     Patient Active Problem List   Diagnosis Date Noted   Rash and nonspecific skin eruption 05/22/2023   Facial tingling sensation 05/22/2023   Pre-diabetes 04/08/2021   Vitamin D deficiency 04/08/2021   COVID-19 vaccination declined 12/04/2020   Centrilobular emphysema (HCC) 04/08/2020   Nodule of upper lobe of left lung 04/08/2020   Aortic atherosclerosis (HCC) 05/21/2019   Coronary artery calcification seen on CT scan 05/21/2019   Former smoker 05/21/2019   Hyperlipidemia 05/21/2019    Past Surgical History:  Procedure Laterality Date   APPENDECTOMY     CHOLECYSTECTOMY     SHOULDER ARTHROSCOPY Left 07/02/2015   Procedure: ,left shoulder arthroscopy, decompression and debridement;  Surgeon: Christena Flake, MD;  Location: [facility] ORS;  Service: Orthopedics;  Laterality: Left;   SHOULDER CLOSED REDUCTION Left 04/09/2015   Procedure: CLOSED MANIPULATION SHOULDER;   Surgeon: Kennedy Bucker, MD;  Location: [facility] ORS;  Service: Orthopedics;  Laterality: Left;    OB History     Gravida  1   Para  1   Term  1   Preterm      AB      Living  1      SAB      IAB      Ectopic      Multiple      Live Births  1            Home Medications    Prior to Admission medications   Medication Sig Start Date End Date Taking? Authorizing Provider  cetirizine (ZYRTEC ALLERGY) 10 MG tablet Take 1 tablet (10 mg total) by mouth daily for 7 days. 05/22/23 05/29/23 Yes Miyoshi Ligas, Para March, NP  albuterol (PROVENTIL) (2.5 MG/3ML) 0.083% nebulizer solution USE 1 VIAL VIA NEBULIZER EVERY 6 HOURS AS NEEDED FOR WHEEZING OR SHORTNESS OF BREATH 03/18/23   [name], MD  albuterol (VENTOLIN HFA) 108 (90 Base) MCG/ACT inhaler INHALE 2 PUFFS INTO THE LUNGS EVERY 4 HOURS AS NEEDED FOR WHEEZING OR SHORTNESS OF BREATH 11/26/22   [name], [name], DO  aspirin EC 81 MG tablet Take 1 tablet (81 mg total) by mouth daily. 05/22/19   [name], MD  busPIRone (BUSPAR) 5 MG tablet TAKE 1 TABLET BY MOUTH TWICE DAILY AS NEEDED FOR ANXIETY 05/11/23   [name], [name], DO  Calcium Carbonate-Vit D-Min (CALCIUM 1200) 1200-1000 MG-UNIT CHEW  Chew 1,200 mg by mouth daily. 01/31/19   [name], Jaquelyn Bitter, MD  Cholecalciferol (VITAMIN D3) 125 MCG (5000 UT) TABS Take by mouth. Vitamin D3 5,000 IU daily for 12 weeks then reduce to OTC Vitamin D3 2,000 IU daily for maintenance    [provider]  ezetimibe (ZETIA) 10 MG tablet Take 1 tablet (10 mg total) by mouth daily. 04/15/21   [name], MD  famotidine (PEPCID) 20 MG tablet Take 1 tablet (20 mg total) by mouth 2 (two) times daily as needed. 03/10/23   [name], [name], DO  feeding supplement (ENSURE ENLIVE / ENSURE PLUS) LIQD Take 237 mLs by mouth 2 (two) times daily between meals. 12/23/20   [name], DO  Fluticasone-Umeclidin-Vilant (TRELEGY ELLIPTA) 200-62.5-25 MCG/ACT AEPB Inhale  1 puff into the lungs daily. 05/07/23   [name], MD  mometasone (ELOCON) 0.1 % lotion Apply topically. 02/11/22   [provider]  montelukast (SINGULAIR) 10 MG tablet Take 1 tablet (10 mg total) by mouth at bedtime. 02/23/23   Lorre Munroe, NP  Multiple Vitamin (MULTIVITAMIN WITH MINERALS) TABS tablet Take 1 tablet by mouth daily. 12/24/20   [name], DO  omeprazole (PRILOSEC) 20 MG capsule Take 1 capsule (20 mg total) by mouth 2 (two) times daily before a meal. 04/20/23   [name], [name], DO  ondansetron (ZOFRAN-ODT) 4 MG disintegrating tablet Take 1 tablet (4 mg total) by mouth every 8 (eight) hours as needed for nausea or vomiting. 03/11/22   [name], [name], DO  OXYGEN Inhale 3 L into the lungs at bedtime as needed.    [provider]  predniSONE (STERAPRED UNI-PAK 21 TAB) 10 MG (21) TBPK tablet Take by mouth daily. Take 6 tabs by mouth daily for 1, then 5 tabs for 1 day, then 4 tabs for 1 day, then 3 tabs for 1 day, then 2 tabs for 1 day, then 1 tab for 1 day. 04/29/23   Brimage, Seward Meth, DO  roflumilast (DALIRESP) 500 MCG TABS tablet Start 1 tablet every other day for 1 week then 1 tablet daily. 11/12/22   [name], MD  rosuvastatin (CRESTOR) 10 MG tablet Take 1 tablet (10 mg total) by mouth daily. 02/09/22   [name], MD  sodium chloride (OCEAN) 0.65 % SOLN nasal spray Place 1 spray into both nostrils as needed for congestion.    [provider]  Specialty Vitamins Products (HAIR NOURISHING SUPPLEMENT PO) Take 1 tablet by mouth daily.    [provider]  tiZANidine (ZANAFLEX) 2 MG tablet Take 1 tablet (2 mg total) by mouth every 8 (eight) hours as needed for muscle spasms. 03/26/23   [name], [name], DO  triamcinolone cream (KENALOG) 0.5 % Apply 1 Application topically 2 (two) times daily as needed (allergic reaction on skin swelling). To affected areas, for up to 2 weeks. 04/20/23   [name], DO    Family History Family History  Problem Relation Age of Onset   Breast cancer Mother 16   Alzheimer's disease Mother     Social History Social History   Tobacco Use   Smoking status: Former    Packs/day: 0.75    Years: 41.00    Additional pack years: 0.00    Total pack years: 30.75    Types: Cigarettes    Quit date: 12/08/2015    Years since quitting: 7.4   Smokeless tobacco: Never  Vaping Use   Vaping Use: Never  used  Substance Use Topics   Alcohol use: No   Drug use: No     Allergies   Patient has no known allergies.   Review of Systems Review of Systems   Physical Exam Triage Vital Signs ED Triage Vitals  Enc Vitals Group     BP      Pulse      Resp      Temp      Temp src      SpO2      Weight      Height      Head Circumference      Peak Flow      Pain Score      Pain Loc      Pain Edu?      Excl. in GC?    No data found.  Updated Vital Signs BP 106/74 (BP Location: Left Arm)   Pulse 85   Temp 98 F (36.7 C) (Oral)   Resp 18   Ht 5\' 4"  (1.626 m)   Wt 120 lb (54.4 kg)   SpO2 97%   BMI 20.60 kg/m   Visual Acuity Right Eye Distance:   Left Eye Distance:   Bilateral Distance:    Right Eye Near:   Left Eye Near:    Bilateral Near:     Physical Exam HENT:     Head: Normocephalic.  Eyes:     Pupils: Pupils are equal, round, and reactive to light.  Neck:     Trachea: Trachea normal.  Cardiovascular:     Rate and Rhythm: Normal rate and regular rhythm.     Pulses: Normal pulses.     Heart sounds: Normal heart sounds.  Pulmonary:     Effort: Pulmonary effort is normal.     Breath sounds: Normal breath sounds and air entry.  Musculoskeletal:     Cervical back: Normal range of motion.  Skin:    General: Skin is warm.     Capillary Refill: Capillary refill takes less than 2 seconds.     Findings: Rash present.       Neurological:     General: No focal deficit present.     Mental Status: She is alert and oriented to  person, place, and time.     GCS: GCS eye subscore is 4. GCS verbal subscore is 5. GCS motor subscore is 6.     Cranial Nerves: Cranial nerves 2-12 are intact.     Sensory: Sensation is intact.     Motor: Motor function is intact.     Coordination: Coordination is intact.     Gait: Gait is intact.  Psychiatric:        Attention and Perception: Attention normal.        Mood and Affect: Mood normal.        Speech: Speech normal.      UC Treatments / Results  Labs (all labs ordered are listed, but only abnormal results are displayed) Labs Reviewed - No data to display  EKG   Radiology No results found.  Procedures Procedures (including critical care time)  Medications Ordered in UC Medications - No data to display  Initial Impression / Assessment and Plan / UC Course  I have reviewed the triage vital signs and the nursing notes.  Pertinent labs & imaging results that were available during my care of the patient were reviewed by me and considered in my medical decision making (see chart for details).  Discussed exam findings and plan of care with patient ,patient is afebrile in urgent care, rash localized to area underneath right breast.  Most likely this is dermatitis.  Expressed concern to patient regarding evaluation for facial tingling.  Patient states she has a lung CT scheduled for Tuesday and will ask them to scan her head at that time, discussed again with patient that if she is truly having intermittent tingling will need to have evaluation in the emergency room(Betsy, RN, at bedside during discussion).  Patient verbalized understanding to this provider and RN.   Ddx: Rash, insect bite, facial tingling, trigeminal neuralgia, TIA Final Clinical Impressions(s) / UC Diagnoses   Final diagnoses:  Rash and nonspecific skin eruption  Facial tingling sensation     Discharge Instructions      Recommend going to Er for evaluation of tingling in face(will need  further imaging and workup, no CT/MRI at this facility).   Make sure to avoid early morning,late evening as insect activity is at peak. Use bug spray when outside, long sleeves/pants, apply triamcinolone cream you have at home on insect bites as prescribed,take zyrtec as prescribed. Wash hands frequently, trim nails,avoid scratching.  If you develop fever, streaking, worsening issues please follow up immediately with PCP or go to ER.      ED Prescriptions     Medication Sig Dispense Auth. Provider   cetirizine (ZYRTEC ALLERGY) 10 MG tablet Take 1 tablet (10 mg total) by mouth daily for 7 days. 7 tablet Izzabella Besse, Para March, NP      PDMP not reviewed this encounter.   Clancy Gourd, NP 05/22/23 1418

## 2023-05-24 DIAGNOSIS — J449 Chronic obstructive pulmonary disease, unspecified: Secondary | ICD-10-CM | POA: Diagnosis not present

## 2023-05-25 ENCOUNTER — Ambulatory Visit: Payer: No Typology Code available for payment source | Attending: Pulmonary Disease

## 2023-05-25 ENCOUNTER — Ambulatory Visit
Admission: RE | Admit: 2023-05-25 | Discharge: 2023-05-25 | Disposition: A | Discharge status: Home / Self Care | Payer: No Typology Code available for payment source | Source: Ambulatory Visit | Attending: Family Medicine | Admitting: Family Medicine

## 2023-05-25 DIAGNOSIS — J42 Unspecified chronic bronchitis: Secondary | ICD-10-CM | POA: Diagnosis not present

## 2023-05-25 DIAGNOSIS — R079 Chest pain, unspecified: Secondary | ICD-10-CM | POA: Diagnosis not present

## 2023-05-25 DIAGNOSIS — J449 Chronic obstructive pulmonary disease, unspecified: Secondary | ICD-10-CM | POA: Diagnosis not present

## 2023-05-25 DIAGNOSIS — Z87891 Personal history of nicotine dependence: Secondary | ICD-10-CM | POA: Insufficient documentation

## 2023-05-25 DIAGNOSIS — J439 Emphysema, unspecified: Secondary | ICD-10-CM | POA: Diagnosis not present

## 2023-05-25 DIAGNOSIS — J4489 Other specified chronic obstructive pulmonary disease: Secondary | ICD-10-CM | POA: Diagnosis not present

## 2023-05-25 DIAGNOSIS — R0602 Shortness of breath: Secondary | ICD-10-CM | POA: Diagnosis not present

## 2023-05-25 DIAGNOSIS — R0789 Other chest pain: Secondary | ICD-10-CM

## 2023-05-25 DIAGNOSIS — I700 Atherosclerosis of aorta: Secondary | ICD-10-CM | POA: Diagnosis not present

## 2023-05-25 LAB — PULMONARY FUNCTION TEST ARMC ONLY
DL/VA % pred: 37 %
DL/VA: 1.55 ml/min/mmHg/L
DLCO unc % pred: 34 %
DLCO unc: 6.69 ml/min/mmHg
FEF 25-75 Post: 0.54 L/sec
FEF 25-75 Pre: 0.37 L/sec
FEF2575-%Change-Post: 45 %
FEF2575-%Pred-Post: 26 %
FEF2575-%Pred-Pre: 18 %
FEV1-%Change-Post: 17 %
FEV1-%Pred-Post: 50 %
FEV1-%Pred-Pre: 42 %
FEV1-Post: 1.18 L
FEV1-Pre: 1 L
FEV1FVC-%Change-Post: 7 %
FEV1FVC-%Pred-Pre: 53 %
FEV6-%Change-Post: 13 %
FEV6-%Pred-Post: 84 %
FEV6-%Pred-Pre: 73 %
FEV6-Post: 2.5 L
FEV6-Pre: 2.19 L
FEV6FVC-%Change-Post: 4 %
FEV6FVC-%Pred-Post: 97 %
FEV6FVC-%Pred-Pre: 93 %
FVC-%Change-Post: 9 %
FVC-%Pred-Post: 86 %
FVC-%Pred-Pre: 78 %
FVC-Post: 2.67 L
FVC-Pre: 2.44 L
Post FEV1/FVC ratio: 44 %
Post FEV6/FVC ratio: 94 %
Pre FEV1/FVC ratio: 41 %
Pre FEV6/FVC Ratio: 90 %
RV % pred: 173 %
RV: 3.69 L
TLC % pred: 124 %
TLC: 6.3 L

## 2023-05-25 MED ORDER — ALBUTEROL SULFATE (2.5 MG/3ML) 0.083% IN NEBU
2.5000 mg | INHALATION_SOLUTION | Freq: Once | RESPIRATORY_TRACT | Status: AC
  Administered 2023-05-25: 2.5 mg via RESPIRATORY_TRACT
  Filled 2023-05-25: qty 3

## 2023-05-25 MED ORDER — IOPAMIDOL (ISOVUE-300) INJECTION 61%
75.0000 mL | Freq: Once | INTRAVENOUS | Status: AC | PRN
  Administered 2023-05-25: 75 mL via INTRAVENOUS

## 2023-06-05 ENCOUNTER — Encounter: Payer: Self-pay | Admitting: Pulmonary Disease

## 2023-06-14 ENCOUNTER — Telehealth: Payer: No Typology Code available for payment source

## 2023-06-14 ENCOUNTER — Telehealth: Payer: Self-pay

## 2023-06-14 NOTE — Telephone Encounter (Signed)
   CCM RN Visit Note   06-14-2023 Name: [name] MRN: [id number]      DOB: [birth year]  Subjective: Brandi [name] is a 67 y.o. year old female who is a primary care patient of Dr. Althea Charon. The patient was referred to the Chronic Care Management team for assistance with care management needs subsequent to provider initiation of CCM services and plan of care.      An unsuccessful telephone outreach was attempted today to contact the patient about Chronic Care Management needs.    Plan:A HIPAA compliant phone message was left for the patient providing contact information and requesting a return call.  Alto Denver RN, MSN, CCM RN Care Manager  Chronic Care Management Direct Number: [phone]

## 2023-06-23 DIAGNOSIS — J449 Chronic obstructive pulmonary disease, unspecified: Secondary | ICD-10-CM | POA: Diagnosis not present

## 2023-06-29 ENCOUNTER — Other Ambulatory Visit: Payer: Self-pay | Admitting: Internal Medicine

## 2023-06-30 NOTE — Telephone Encounter (Signed)
Requested Prescriptions  Pending Prescriptions Disp Refills   montelukast (SINGULAIR) 10 MG tablet [Pharmacy Med Name: MONTELUKAST SODIUM 10 MG TAB] 90 tablet 0    Sig: TAKE 1 TABLET BY MOUTH AT BEDTIME     Pulmonology:  Leukotriene Inhibitors Passed - 06/29/2023  3:11 PM      Passed - Valid encounter within last 12 months    Recent Outpatient Visits           2 months ago Allergy to ant bite   [place] [facility] [place], [name], DO   3 months ago Post herpetic neuralgia   [place] [facility] [place], [name], DO   3 months ago Herpes zoster without complication   [place] [facility] [place], [name], DO   4 months ago Community acquired pneumonia of left lung, unspecified part of lung   [place] [facility] [place], Salvadore Oxford, NP   4 months ago Chronic hypoxemic respiratory failure [facility])   [place] [facility] [place], Salvadore Oxford, [place]

## 2023-07-13 ENCOUNTER — Other Ambulatory Visit: Payer: No Typology Code available for payment source

## 2023-07-13 ENCOUNTER — Other Ambulatory Visit: Payer: Self-pay

## 2023-07-13 ENCOUNTER — Telehealth: Payer: No Typology Code available for payment source

## 2023-07-13 NOTE — Patient Outreach (Signed)
Care Management   Visit Note  07/13/2023 Name: Brandi [name] MRN: [id number] DOB: 04-13-1956  Subjective: Brandi [name] is a 67 y.o. year old female who is a primary care patient of [name], DO. The Care Management team was consulted for assistance.      Engaged with patient spoke with patient by telephone.    Goals Addressed             This Visit's Progress    RNCM Care Management  Expected Outcome:  Monitor, Self-Manage and Reduce Symptoms of  COPD Emphysema       Current Barriers:  Knowledge Deficits related to the need for regular follow up for COPD and other chronic conditions Care Coordination needs related to expressed needs of the patient with COPD and the need for collaboration with the providers in a patient with COPD Chronic Disease Management support and education needs related to effective management of COPD  Planned Interventions: Provided patient with basic written and verbal COPD education on self care/management/and exacerbation prevention. The patient states when it is cooler weather she does better. States her breathing is stable right now. Denies any acute findings today. Will see the pulmonary provider next week.  Advised patient to track and manage COPD triggers. The patient states she is doing better with her breathing since seeing her pulmonary provider.. She states the inhalers she is using now are very good for her. She thinks the Trelegy is causing her to be more hoarse and ask about going back on the [place]. Education on talking to the provider at her visit next week.  The patient states that she is monitoring for changes. When it is hot outside that is when she has the worst of problems. Education on pacing activity and monitoring for triggers that exacerbate  her condition.  Provided written and verbal instructions on pursed lip breathing and utilized returned demonstration as teach back Provided instruction about proper use of medications  used for management of COPD including inhalers. Has just switched from [facility] to Trelegy and has her inhalers. Uses oxygen at night and when needed. States that the Trelegy has worked well for her but it is more of a powder and thinks it is causing hoarseness. Wants to talk to the provider about changing back to [facility]. Denies any issues with getting her medications.  Advised patient to self assesses COPD action plan zone and make appointment with provider if in the yellow zone for 48 hours without improvement Advised patient to engage in light exercise as tolerated 3-5 days a week to aid in the the management of COPD Provided education about and advised patient to utilize infection prevention strategies to reduce risk of respiratory infection. Education on taking all medications as directed.  Discussed the importance of adequate rest and management of fatigue with COPD. Education on pacing activity and monitoring for factors that exacerbate her condition.  Screening for signs and symptoms of depression related to chronic disease state  Assessed social determinant of health barriers  Symptom Management: Take medications as prescribed   Attend all scheduled provider appointments Call provider office for new concerns or questions  call the Suicide and Crisis Lifeline: 988 call the [place] National Suicide Prevention Lifeline: 223-667-5524 or TTY: (360)571-2024 TTY (406)572-6777) to talk to a trained counselor call 1-800-273-TALK (toll free, 24 hour hotline) if experiencing a Mental Health or Behavioral Health Crisis  avoid second hand smoke eliminate smoking in my home identify and avoid work-related triggers identify and remove indoor  air pollutants limit outdoor activity during cold weather listen for public air quality announcements every day do breathing exercises every day develop a rescue plan eliminate symptom triggers at home follow rescue plan if symptoms flare-up  Follow Up Plan:  Telephone follow up appointment with care management team member scheduled for: 09-21-2023 at 1145 am       RNCM Care Management Expected outcome:  Monitor, Self-Manage and Reduce Symptoms of HLD       Current Barriers:  Knowledge Deficits related to the importance of regular visits to see the provider for effective management of HLD and other chronic conditions Chronic Disease Management support and education needs related to effective management of HLD Lab Results  Component Value Date   CHOL 165 12/22/2021   HDL 63 12/22/2021   LDLCALC 89 12/22/2021   TRIG 64 12/22/2021   CHOLHDL 2.6 12/22/2021    Planned Interventions: Provider established cholesterol goals reviewed. Needs new labwork done. Education and support given; Counseled on importance of regular laboratory monitoring as prescribed. The patients labs are stable. Education on needing to get updated lipid panel. The patient has seen the provider recently but not for labs. Will continue to monitor for changes. ; Provided HLD educational materials; Reviewed role and benefits of statin for ASCVD risk reduction. Takes Zetia 10 mg QD and rosuvastatin 10 mg Q. Denies any issues with medication compliance; Discussed strategies to manage statin-induced myalgias; Reviewed importance of limiting foods high in cholesterol; Reviewed exercise goals and target of 150 minutes per week; Screening for signs and symptoms of depression related to chronic disease state;  Assessed social determinant of health barriers;   Symptom Management: Take medications as prescribed   Attend all scheduled provider appointments Call provider office for new concerns or questions  call the Suicide and Crisis Lifeline: 988 call the [place] National Suicide Prevention Lifeline: (319) 776-4571 or TTY: 606 156 7454 TTY 972 786 2980) to talk to a trained counselor call 1-800-273-TALK (toll free, 24 hour hotline) if experiencing a Mental Health or Behavioral Health  Crisis  - call for medicine refill 2 or 3 days before it runs out - take all medications exactly as prescribed - call doctor with any symptoms you believe are related to your medicine - call doctor when you experience any new symptoms - go to all doctor appointments as scheduled  Follow Up Plan: Telephone follow up appointment with care management team member scheduled for: 09-21-2023 at 1145 am        RNCM Care Management Expected Outcome:  Monitor, Self-Manage and Reduce Symptoms of: Pre Diabetes       Current Barriers:  Knowledge Deficits related to the importance of regular follow up for Chronic Disease Management and Care Coordination Needs and effective management of pre- DM Care Coordination needs related to food resources and educational needs  in a patient with DM Chronic Disease Management support and education needs related to effective management of DM Lab Results  Component Value Date   HGBA1C 5.8 (H) 01/20/2021     Planned Interventions: Provided education to patient about basic DM disease process. Denies any changes in her health and well being. Overdue for labs to check the status of Pre-Dm, encourage follow up with the pcp.; Reviewed medications with patient and discussed importance of medication adherence. The patient is compliant with medications. Does not want to take prednisone at this causes her issues;        Reviewed prescribed diet with patient heart healthy/ADA diet. The patient states sometimes she has  issues with her blood sugars getting low; Counseled on importance of regular laboratory monitoring as prescribed. Review of the need to get updated A1C;        Discussed plans with patient for ongoing care management follow up and provided patient with direct contact information for care management team;      Provided patient with written educational materials related to hypo and hyperglycemia and importance of correct treatment;       Reviewed scheduled/upcoming  provider appointments including:Will see pulmonary provider next week, needs follow up with the pcp. call provider for findings outside established parameters;       Referral made to community resources care guide team for assistance with food resources due to financial strain;      Review of patient status, including review of consultants reports, relevant laboratory and other test results, and medications completed;       Advised patient to discuss changes in her DM, questions, concerns, and the need for new labwork with provider;      Screening for signs and symptoms of depression related to chronic disease state;        Assessed social determinant of health barriers;        The patient currently has shingles. Her shingles are better. She states that she does still have some flare ups in her symptoms. Discussed how it takes time for the shingles to get better and improve. The patient verbalized understanding.   Symptom Management: Take medications as prescribed   Attend all scheduled provider appointments Call provider office for new concerns or questions  call the Suicide and Crisis Lifeline: 988 call the [place] National Suicide Prevention Lifeline: 210-655-8790 or TTY: 905-855-2185 TTY 813-874-6460) to talk to a trained counselor call 1-800-273-TALK (toll free, 24 hour hotline) if experiencing a Mental Health or Behavioral Health Crisis  check feet daily for cuts, sores or redness trim toenails straight across wash and dry feet carefully every day wear comfortable, cotton socks wear comfortable, well-fitting shoes  Follow Up Plan: Telephone follow up appointment with care management team member scheduled for: 09-21-2023 at 1145 am           Consent to Services:  Patient was given information about care management services, agreed to services, and gave verbal consent to participate.   Plan: Telephone follow up appointment with care management team member scheduled for: 09-21-2023  at 1145 am  Alto Denver RN, MSN, CCM RN Care Manager  [facility]  Ambulatory Care Management  Direct Number: [phone]

## 2023-07-13 NOTE — Patient Instructions (Signed)
Visit Information  Thank you for taking time to visit with me today. Please don't hesitate to contact me if I can be of assistance to you before our next scheduled telephone appointment.  Following are the goals we discussed today:   Goals Addressed             This Visit's Progress    RNCM Care Management  Expected Outcome:  Monitor, Self-Manage and Reduce Symptoms of  COPD Emphysema       Current Barriers:  Knowledge Deficits related to the need for regular follow up for COPD and other chronic conditions Care Coordination needs related to expressed needs of the patient with COPD and the need for collaboration with the providers in a patient with COPD Chronic Disease Management support and education needs related to effective management of COPD  Planned Interventions: Provided patient with basic written and verbal COPD education on self care/management/and exacerbation prevention. The patient states when it is cooler weather she does better. States her breathing is stable right now. Denies any acute findings today. Will see the pulmonary provider next week.  Advised patient to track and manage COPD triggers. The patient states she is doing better with her breathing since seeing her pulmonary provider.. She states the inhalers she is using now are very good for her. She thinks the Trelegy is causing her to be more hoarse and ask about going back on the [place]. Education on talking to the provider at her visit next week.  The patient states that she is monitoring for changes. When it is hot outside that is when she has the worst of problems. Education on pacing activity and monitoring for triggers that exacerbate  her condition.  Provided written and verbal instructions on pursed lip breathing and utilized returned demonstration as teach back Provided instruction about proper use of medications used for management of COPD including inhalers. Has just switched from [facility] to Trelegy and has her  inhalers. Uses oxygen at night and when needed. States that the Trelegy has worked well for her but it is more of a powder and thinks it is causing hoarseness. Wants to talk to the provider about changing back to [facility]. Denies any issues with getting her medications.  Advised patient to self assesses COPD action plan zone and make appointment with provider if in the yellow zone for 48 hours without improvement Advised patient to engage in light exercise as tolerated 3-5 days a week to aid in the the management of COPD Provided education about and advised patient to utilize infection prevention strategies to reduce risk of respiratory infection. Education on taking all medications as directed.  Discussed the importance of adequate rest and management of fatigue with COPD. Education on pacing activity and monitoring for factors that exacerbate her condition.  Screening for signs and symptoms of depression related to chronic disease state  Assessed social determinant of health barriers  Symptom Management: Take medications as prescribed   Attend all scheduled provider appointments Call provider office for new concerns or questions  call the Suicide and Crisis Lifeline: 988 call the [place] National Suicide Prevention Lifeline: 508-813-8734 or TTY: (208) 295-9862 TTY 617 685 4791) to talk to a trained counselor call 1-800-273-TALK (toll free, 24 hour hotline) if experiencing a Mental Health or Behavioral Health Crisis  avoid second hand smoke eliminate smoking in my home identify and avoid work-related triggers identify and remove indoor air pollutants limit outdoor activity during cold weather listen for public air quality announcements every day do breathing  exercises every day develop a rescue plan eliminate symptom triggers at home follow rescue plan if symptoms flare-up  Follow Up Plan: Telephone follow up appointment with care management team member scheduled for: 09-21-2023 at 1145 am        RNCM Care Management Expected outcome:  Monitor, Self-Manage and Reduce Symptoms of HLD       Current Barriers:  Knowledge Deficits related to the importance of regular visits to see the provider for effective management of HLD and other chronic conditions Chronic Disease Management support and education needs related to effective management of HLD Lab Results  Component Value Date   CHOL 165 12/22/2021   HDL 63 12/22/2021   LDLCALC 89 12/22/2021   TRIG 64 12/22/2021   CHOLHDL 2.6 12/22/2021    Planned Interventions: Provider established cholesterol goals reviewed. Needs new labwork done. Education and support given; Counseled on importance of regular laboratory monitoring as prescribed. The patients labs are stable. Education on needing to get updated lipid panel. The patient has seen the provider recently but not for labs. Will continue to monitor for changes. ; Provided HLD educational materials; Reviewed role and benefits of statin for ASCVD risk reduction. Takes Zetia 10 mg QD and rosuvastatin 10 mg Q. Denies any issues with medication compliance; Discussed strategies to manage statin-induced myalgias; Reviewed importance of limiting foods high in cholesterol; Reviewed exercise goals and target of 150 minutes per week; Screening for signs and symptoms of depression related to chronic disease state;  Assessed social determinant of health barriers;   Symptom Management: Take medications as prescribed   Attend all scheduled provider appointments Call provider office for new concerns or questions  call the Suicide and Crisis Lifeline: 988 call the [place] National Suicide Prevention Lifeline: (787) 264-2223 or TTY: 757-244-6273 TTY (559)047-5381) to talk to a trained counselor call 1-800-273-TALK (toll free, 24 hour hotline) if experiencing a Mental Health or Behavioral Health Crisis  - call for medicine refill 2 or 3 days before it runs out - take all medications exactly as  prescribed - call doctor with any symptoms you believe are related to your medicine - call doctor when you experience any new symptoms - go to all doctor appointments as scheduled  Follow Up Plan: Telephone follow up appointment with care management team member scheduled for: 09-21-2023 at 1145 am        RNCM Care Management Expected Outcome:  Monitor, Self-Manage and Reduce Symptoms of: Pre Diabetes       Current Barriers:  Knowledge Deficits related to the importance of regular follow up for Chronic Disease Management and Care Coordination Needs and effective management of pre- DM Care Coordination needs related to food resources and educational needs  in a patient with DM Chronic Disease Management support and education needs related to effective management of DM Lab Results  Component Value Date   HGBA1C 5.8 (H) 01/20/2021     Planned Interventions: Provided education to patient about basic DM disease process. Denies any changes in her health and well being. Overdue for labs to check the status of Pre-Dm, encourage follow up with the pcp.; Reviewed medications with patient and discussed importance of medication adherence. The patient is compliant with medications. Does not want to take prednisone at this causes her issues;        Reviewed prescribed diet with patient heart healthy/ADA diet. The patient states sometimes she has issues with her blood sugars getting low; Counseled on importance of regular laboratory monitoring as prescribed. Review of  the need to get updated A1C;        Discussed plans with patient for ongoing care management follow up and provided patient with direct contact information for care management team;      Provided patient with written educational materials related to hypo and hyperglycemia and importance of correct treatment;       Reviewed scheduled/upcoming provider appointments including:Will see pulmonary provider next week, needs follow up with the  pcp. call provider for findings outside established parameters;       Referral made to community resources care guide team for assistance with food resources due to financial strain;      Review of patient status, including review of consultants reports, relevant laboratory and other test results, and medications completed;       Advised patient to discuss changes in her DM, questions, concerns, and the need for new labwork with provider;      Screening for signs and symptoms of depression related to chronic disease state;        Assessed social determinant of health barriers;        The patient currently has shingles. Her shingles are better. She states that she does still have some flare ups in her symptoms. Discussed how it takes time for the shingles to get better and improve. The patient verbalized understanding.   Symptom Management: Take medications as prescribed   Attend all scheduled provider appointments Call provider office for new concerns or questions  call the Suicide and Crisis Lifeline: 988 call the [place] National Suicide Prevention Lifeline: 234 764 1505 or TTY: (573) 091-8728 TTY (234)871-8582) to talk to a trained counselor call 1-800-273-TALK (toll free, 24 hour hotline) if experiencing a Mental Health or Behavioral Health Crisis  check feet daily for cuts, sores or redness trim toenails straight across wash and dry feet carefully every day wear comfortable, cotton socks wear comfortable, well-fitting shoes  Follow Up Plan: Telephone follow up appointment with care management team member scheduled for: 09-21-2023 at 1145 am           Our next appointment is by telephone on 09-21-2023 at 1145 am   Please call the care guide team at [phone] if you need to cancel or reschedule your appointment.   If you are experiencing a Mental Health or Behavioral Health Crisis or need someone to talk to, please call the Suicide and Crisis Lifeline: 988 call the [place] National  Suicide Prevention Lifeline: (226)162-4140 or TTY: 931 517 4786 TTY 540-624-6053) to talk to a trained counselor call 1-800-273-TALK (toll free, 24 hour hotline)   Patient verbalizes understanding of instructions and care plan provided today and agrees to view in MyChart. Active MyChart status and patient understanding of how to access instructions and care plan via MyChart confirmed with patient.     Telephone follow up appointment with care management team member scheduled for: 09-21-2023 at 1145 am  Alto Denver RN, MSN, CCM RN Care Manager  [facility]  Ambulatory Care Management  Direct Number: [phone]

## 2023-07-20 DIAGNOSIS — Z6820 Body mass index (BMI) 20.0-20.9, adult: Secondary | ICD-10-CM | POA: Diagnosis not present

## 2023-07-20 DIAGNOSIS — I700 Atherosclerosis of aorta: Secondary | ICD-10-CM | POA: Diagnosis not present

## 2023-07-20 DIAGNOSIS — M810 Age-related osteoporosis without current pathological fracture: Secondary | ICD-10-CM | POA: Diagnosis not present

## 2023-07-20 DIAGNOSIS — J439 Emphysema, unspecified: Secondary | ICD-10-CM | POA: Diagnosis not present

## 2023-07-20 DIAGNOSIS — Z9981 Dependence on supplemental oxygen: Secondary | ICD-10-CM | POA: Diagnosis not present

## 2023-07-20 DIAGNOSIS — I2510 Atherosclerotic heart disease of native coronary artery without angina pectoris: Secondary | ICD-10-CM | POA: Diagnosis not present

## 2023-07-20 DIAGNOSIS — E785 Hyperlipidemia, unspecified: Secondary | ICD-10-CM | POA: Diagnosis not present

## 2023-07-20 DIAGNOSIS — F411 Generalized anxiety disorder: Secondary | ICD-10-CM | POA: Diagnosis not present

## 2023-07-20 DIAGNOSIS — K219 Gastro-esophageal reflux disease without esophagitis: Secondary | ICD-10-CM | POA: Diagnosis not present

## 2023-07-20 DIAGNOSIS — Z008 Encounter for other general examination: Secondary | ICD-10-CM | POA: Diagnosis not present

## 2023-07-23 ENCOUNTER — Telehealth: Payer: Self-pay

## 2023-07-23 ENCOUNTER — Encounter: Payer: Self-pay | Admitting: Pulmonary Disease

## 2023-07-23 ENCOUNTER — Ambulatory Visit (INDEPENDENT_AMBULATORY_CARE_PROVIDER_SITE_OTHER): Payer: No Typology Code available for payment source | Admitting: Pulmonary Disease

## 2023-07-23 VITALS — BP 120/82 | HR 75 | Temp 97.5°F | Ht 64.0 in | Wt 121.4 lb

## 2023-07-23 DIAGNOSIS — Z87891 Personal history of nicotine dependence: Secondary | ICD-10-CM

## 2023-07-23 DIAGNOSIS — J449 Chronic obstructive pulmonary disease, unspecified: Secondary | ICD-10-CM

## 2023-07-23 DIAGNOSIS — J439 Emphysema, unspecified: Secondary | ICD-10-CM

## 2023-07-23 DIAGNOSIS — G4736 Sleep related hypoventilation in conditions classified elsewhere: Secondary | ICD-10-CM | POA: Diagnosis not present

## 2023-07-23 DIAGNOSIS — R0602 Shortness of breath: Secondary | ICD-10-CM | POA: Diagnosis not present

## 2023-07-23 MED ORDER — OHTUVAYRE 3 MG/2.5ML IN SUSP: 3.0000 mg | Freq: Two times a day (BID) | RESPIRATORY_TRACT | 11 refills | Status: DC

## 2023-07-23 MED ORDER — BREZTRI AEROSPHERE 160-9-4.8 MCG/ACT IN AERO: 2.0000 | INHALATION_SPRAY | Freq: Two times a day (BID) | RESPIRATORY_TRACT | 0 refills | Status: DC

## 2023-07-23 MED ORDER — BREZTRI AEROSPHERE 160-9-4.8 MCG/ACT IN AERO: 2.0000 | INHALATION_SPRAY | Freq: Two times a day (BID) | RESPIRATORY_TRACT | 11 refills | Status: DC

## 2023-07-23 NOTE — Telephone Encounter (Signed)
Patient was seen in the office today. Dr. Jayme Cloud has ordered [facility] for the patient. She has filled out the forms and they have been faxed.  She also filled out the [place] in the office today for her Breztri. I have faxed that as well.  Nothing further needed.

## 2023-07-23 NOTE — Patient Instructions (Addendum)
STOP USING THE TRELEGY.  I think the rehab program through your insurance is a good idea.  I would let Dr. Althea Charon know that it is okay to start the Fosamax again.  We are restarting the Breztri 2 puffs twice a day.  Make sure you rinse your mouth well after you use it.  You can use a little baking soda in the water that he used to rinse with this should help prevent hoarseness.  We are starting you on a new medication that is Brandi [name] this is through the nebulizer.  Use it after you do Breztri twice a day.  It is a suspension and has to be shaken up really good before you put it in the nebulizer cup.  It would look a little yellowish that is normal.  This should help greatly with your breathing.  We are checking another level of oxygen at nighttime.  We will see on follow-up in 3 months time call sooner should any new problems arise.

## 2023-07-23 NOTE — Progress Notes (Signed)
Subjective:    Patient ID: Brandi [name], female    DOB: 1956/04/16, 67 y.o.   MRN: [id number]  Patient Care Team: [name], DO as PCP - General (Family Medicine) [name], MD as Consulting Physician (Pulmonary Disease) Marlowe Sax, RN as Case Manager (General Practice)  Chief Complaint  Patient presents with   Follow-up    DOE. Some wheezing. No cough.     HPI Patient is a 67 year old former smoker (47 PY) who presents as a follow-up after an acute visit on 01 March 2023.  She had noted worsening shortness of breath over the course of months prior to that visit.  Recall that the patient was seen on 05 January 2023, at that time she was noted to have a right middle lobe pneumonia, she was treated with antibiotics, she was instructed to return for follow-up and also scheduled for repeat pulmonary function testing.  She did not follow-up and she did not present for pulmonary function testing (no-show).  She presents then as an acute visit on 25 March after having been seen in primary care on 19 March and 22 March.  Chest x-ray was obtained that showed a very small residual of her prior right middle lobe infection.  She also complained of "rib pain" in the epigastric area and left upper quadrant area.  She was switched to Trelegy Ellipta at that time, laboratory data was ordered and a CT scan of the chest was ordered.  It turned out at the time she had issues with herpes zoster and these resolved.  Her pain resolved after that.  She was supposed to follow-up with [place] in 2 to 3 weeks time after that visit but never did so.  Since that visit she has not had any recent fevers, chills or sweats.  She has noted that the Trelegy does not have a lasting effect throughout the day.  Felt that the Markus Daft was doing better for her.  She has not had any weight loss.  She has not been consistent/compliant with oxygen at 4 L/min nocturnally.  No chest pain since she was last seen  here.  No cough, no hemoptysis.  Has noted some wheezing on occasion.  She has had more hoarseness with the Trelegy.  She does not endorse any other symptomatology.   She is to get pulmonary rehabilitation through a program through her insurance.  They did not cover our pulmonary rehab program.  She continues to remain abstinent of cigarettes.  She is concerned about being off of Fosamax.  She is not endorsing any gastroesophageal reflux symptoms.  Given that she is not having gastroesophageal reflux symptoms any longer she may restart this after discussion with her primary care.  DATA 04/09/2021 LDCT: Moderate to severe paraseptal and centrilobular emphysema.  Left upper lobe nodule measuring 2 mm (decreased from prior), compression fractures T7 and T9 noted previously and unchanged.  Coronary artery calcifications. 07/04/2021 alpha-1 antitrypsin: Phenotype MM, level 140 mg/dL (normal) 40/98/1191 overnight oximetry: Baseline O2 sat 90% during sleep with desaturations as low as 83% during the night desaturation events lasted for over an hour. 08/14/2021 PFTs: FEV1 1.13 L or 46% predicted, FVC 2.42 L or 76% predicted, FEV1/FVC 47%, there is a mild response to bronchodilator with a 10% net change.  There is hyperinflation with TLC at 128% and air trapping with RV at 188%.  Diffusion capacity is moderately to severely reduced by Kco.  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  08/14/2021 echocardiogram: LVEF 60 to 65%, grade 1 DD 12/04/2021 chest x-ray: Emphysema with no evidence of acute cardiopulmonary disease 12/24/2021 overnight oximetry: Baseline O2 sat 90% desaturations as low as 83%.   12/26/2021 nuclear medicine stress test: No significant ischemia, normal wall motion, EF estimated at 77%, coronary calcification in the LAD, mild aortic atherosclerosis 01/05/2023 chest x-ray PA and lateral: Infiltrates in the right middle lobe favor to be infectious/inflammatory 02/23/2023 chest  x-ray PA and lateral: Improved though incompletely resolved middle lobe infection, minimal residual, emphysema 05/25/2023 CT chest without contrast: No acute cardiopulmonary disease, moderate to severe centrilobular emphysematous disease without significant change.  Borderline cardiomegaly unchanged.  Atherosclerotic coronary artery disease.  Stable mild superior endplate compression deformity mid thoracic spine. 05/25/2023 PFTs: FEV1 1.0 L or 42% predicted, FVC 2.44 L or 78% predicted, FEV1/FVC 41%, there is a reversible component with FEV1 improvement by 17% after bronchodilator.  There is hyperinflation and air trapping.  Diffusion capacity severely impaired, this is consistent with emphysema and reversible airways component.  Lung function has shown modest decrease from prior.   Review of Systems A 10 point review of systems was performed and it is as noted above otherwise negative.   Patient Active Problem List   Diagnosis Date Noted   Rash and nonspecific skin eruption 05/22/2023   Facial tingling sensation 05/22/2023   Pre-diabetes 04/08/2021   Vitamin D deficiency 04/08/2021   COVID-19 vaccination declined 12/04/2020   Centrilobular emphysema (HCC) 04/08/2020   Nodule of upper lobe of left lung 04/08/2020   Aortic atherosclerosis (HCC) 05/21/2019   Coronary artery calcification seen on CT scan 05/21/2019   Former smoker 05/21/2019   Hyperlipidemia 05/21/2019    Social History   Tobacco Use   Smoking status: Former    Current packs/day: 0.00    Average packs/day: 0.8 packs/day for 41.0 years (30.8 ttl pk-yrs)    Types: Cigarettes    Start date: 12/07/1974    Quit date: 12/08/2015    Years since quitting: 7.6   Smokeless tobacco: Never  Substance Use Topics   Alcohol use: No    No Known Allergies  Current Meds  Medication Sig   albuterol (PROVENTIL) (2.5 MG/3ML) 0.083% nebulizer solution USE 1 VIAL VIA NEBULIZER EVERY 6 HOURS AS NEEDED FOR WHEEZING OR SHORTNESS OF BREATH    albuterol (VENTOLIN HFA) 108 (90 Base) MCG/ACT inhaler INHALE 2 PUFFS INTO THE LUNGS EVERY 4 HOURS AS NEEDED FOR WHEEZING OR SHORTNESS OF BREATH   aspirin EC 81 MG tablet Take 1 tablet (81 mg total) by mouth daily.   busPIRone (BUSPAR) 5 MG tablet TAKE 1 TABLET BY MOUTH TWICE DAILY AS NEEDED FOR ANXIETY   Calcium Carbonate-Vit D-Min (CALCIUM 1200) 1200-1000 MG-UNIT CHEW Chew 1,200 mg by mouth daily.   Cholecalciferol (VITAMIN D3) 125 MCG (5000 UT) TABS Take by mouth. Vitamin D3 5,000 IU daily for 12 weeks then reduce to OTC Vitamin D3 2,000 IU daily for maintenance   ezetimibe (ZETIA) 10 MG tablet Take 1 tablet (10 mg total) by mouth daily.   famotidine (PEPCID) 20 MG tablet Take 1 tablet (20 mg total) by mouth 2 (two) times daily as needed.   feeding supplement (ENSURE ENLIVE / ENSURE PLUS) LIQD Take 237 mLs by mouth 2 (two) times daily between meals.   Fluticasone-Umeclidin-Vilant (TRELEGY ELLIPTA) 200-62.5-25 MCG/ACT AEPB Inhale 1 puff into the lungs daily.   mometasone (ELOCON) 0.1 % lotion Apply topically.   montelukast (SINGULAIR) 10 MG tablet TAKE 1 TABLET BY MOUTH  AT BEDTIME   Multiple Vitamin (MULTIVITAMIN WITH MINERALS) TABS tablet Take 1 tablet by mouth daily.   omeprazole (PRILOSEC) 20 MG capsule Take 1 capsule (20 mg total) by mouth 2 (two) times daily before a meal.   ondansetron (ZOFRAN-ODT) 4 MG disintegrating tablet Take 1 tablet (4 mg total) by mouth every 8 (eight) hours as needed for nausea or vomiting.   OXYGEN Inhale 3 L into the lungs at bedtime as needed.   rosuvastatin (CRESTOR) 10 MG tablet Take 1 tablet (10 mg total) by mouth daily.   sodium chloride (OCEAN) 0.65 % SOLN nasal spray Place 1 spray into both nostrils as needed for congestion.   Specialty Vitamins Products (HAIR NOURISHING SUPPLEMENT PO) Take 1 tablet by mouth daily.   tiZANidine (ZANAFLEX) 2 MG tablet Take 1 tablet (2 mg total) by mouth every 8 (eight) hours as needed for muscle spasms.   triamcinolone  cream (KENALOG) 0.5 % Apply 1 Application topically 2 (two) times daily as needed (allergic reaction on skin swelling). To affected areas, for up to 2 weeks.    Immunization History  Administered Date(s) Administered   [employer] Vaccination 05/26/2021, 06/24/2021      Objective:    BP 120/82 (BP Location: Left Arm, Cuff Size: Normal)   Pulse 75   Temp (!) 97.5 F (36.4 C)   Ht 5\' 4"  (1.626 m)   Wt 121 lb 6.4 oz (55.1 kg)   SpO2 98%   BMI 20.84 kg/m   SpO2: 98 % O2 Device: None (Room air)  GENERAL: Well-developed, well-nourished woman, no acute distress.  Presents in transport chair due to dyspnea.  Uncomfortable but no tachypnea, no conversational dyspnea.   HEAD: Normocephalic, atraumatic.  EYES: Pupils equal, round, reactive to light.  No scleral icterus.  MOUTH: Poor dentition. She does have visible nasal septal deviation.  Oral mucosa moist, no thrush. NECK: Supple. No thyromegaly. Trachea midline. No JVD.  No adenopathy. PULMONARY: Distant breath sounds, fair air movement. Coarse, otherwise no adventitious sounds. CARDIOVASCULAR: S1 and S2. Regular rate and rhythm.  No rubs, murmurs or gallops heard. ABDOMEN: Benign. MUSCULOSKELETAL: No joint deformity, no clubbing, no edema.  NEUROLOGIC: No focal deficit, speech is fluent. SKIN: Intact,warm,dry. PSYCH: Mood and behavior normal..     Assessment & Plan:     ICD-10-CM   1. Stage 3 severe COPD by GOLD classification (HCC)  J44.9 Overnight Pulse Oximetry Study   Resume Breztri Discontinue Trelegy: poorly tolerated, not as effective Continue as needed albuterol Trial of Ohtuvayre 3 mg via neb twice daily    2. Shortness of breath  R06.02 Overnight Pulse Oximetry Study   She will benefit from pulmonary rehab Obtaining through insurance program Main culprit is stage III severe COPD    3. Nocturnal hypoxemia due to emphysema (HCC)  J43.9 Overnight Pulse Oximetry Study   G47.36    Has not been compliant  with O2 therapy Reassess need with overnight oximetry on room air    4. Former smoker  Z87.891    No evidence of relapse     Orders Placed This Encounter  Procedures   Overnight Pulse Oximetry Study    Room air DME: [employer]    Standing Status:   Future    Standing Expiration Date:   07/22/2024   Meds ordered this encounter  Medications   Budeson-Glycopyrrol-Formoterol (BREZTRI AEROSPHERE) 160-9-4.8 MCG/ACT AERO    Sig: Inhale 2 puffs into the lungs in the morning and at bedtime.    Dispense:  11.8 g    Refill:  0    Order Specific Question:   Lot Number?    Answer:   [id number]    Order Specific Question:   Expiration Date?    Answer:   12/07/2025    Order Specific Question:   Manufacturer?    Answer:   [employer] [[id number]]    Order Specific Question:   Quantity    Answer:   2   Budeson-Glycopyrrol-Formoterol (BREZTRI AEROSPHERE) 160-9-4.8 MCG/ACT AERO    Sig: Inhale 2 puffs into the lungs in the morning and at bedtime.    Dispense:  10.7 g    Refill:  11   OHTUVAYRE 3 MG/2.5ML SUSP    Sig: Inhale 3 mg into the lungs 2 (two) times daily.    Dispense:  150 mL    Refill:  11   Jamesetta has stage III severe COPD by GOLD criteria.  She has had modest decline in her lung function compared to September 2022 PFTs.  She remains with high symptom burden.  She had tried Daliresp previously but could not tolerate the medication due to GI side effects.  Daliresp however, was helping her symptom burden with COPD.  I think she would be a good candidate for Otuvayre and feel that she would benefit from a trial of this medication.  She was advised that she needs to be compliant with rehab program.  We will reassess her oxygen requirements with overnight oximetry.  The patient is concerned about issues with osteoporosis I have advised her to discuss with her primary care physician.  Given that she is not having any reflux symptoms rechallenging with Fosamax would be reasonable.  I defer this decision  to her primary care physician.  I discussed the above with Dr. Althea Charon via secure chat.   Gailen Shelter, MD Advanced Bronchoscopy PCCM [facility]    *This note was dictated using voice recognition software/Dragon.  Despite best efforts to proofread, errors can occur which can change the meaning. Any transcriptional errors that result from this process are unintentional and may not be fully corrected at the time of dictation.

## 2023-07-24 DIAGNOSIS — J449 Chronic obstructive pulmonary disease, unspecified: Secondary | ICD-10-CM | POA: Diagnosis not present

## 2023-07-29 NOTE — Telephone Encounter (Signed)
Received a fax from [facility]hty[facility] [facility]. They have receive the patient's application and have confirmed her enrollment. Reference/Patient ID [id number].  Nothing further needed.

## 2023-08-02 ENCOUNTER — Ambulatory Visit: Payer: No Typology Code available for payment source | Admitting: Pulmonary Disease

## 2023-08-12 DIAGNOSIS — J441 Chronic obstructive pulmonary disease with (acute) exacerbation: Secondary | ICD-10-CM | POA: Diagnosis not present

## 2023-08-12 DIAGNOSIS — Z008 Encounter for other general examination: Secondary | ICD-10-CM | POA: Diagnosis not present

## 2023-08-24 DIAGNOSIS — J449 Chronic obstructive pulmonary disease, unspecified: Secondary | ICD-10-CM | POA: Diagnosis not present

## 2023-08-25 DIAGNOSIS — Z008 Encounter for other general examination: Secondary | ICD-10-CM | POA: Diagnosis not present

## 2023-08-25 DIAGNOSIS — J441 Chronic obstructive pulmonary disease with (acute) exacerbation: Secondary | ICD-10-CM | POA: Diagnosis not present

## 2023-08-31 ENCOUNTER — Telehealth: Payer: Self-pay | Admitting: Pulmonary Disease

## 2023-08-31 NOTE — Telephone Encounter (Signed)
[facility] called to say pt states she is getting her Ohtyvayre from another location.

## 2023-08-31 NOTE — Telephone Encounter (Signed)
Lm x1 for patient.  

## 2023-09-01 NOTE — Telephone Encounter (Signed)
Lm x2 for patient.  Will close encounter per office protocol.

## 2023-09-03 ENCOUNTER — Telehealth: Payer: Self-pay | Admitting: Pulmonary Disease

## 2023-09-03 NOTE — Telephone Encounter (Signed)
Spoke to patient and confirmed that she has received ohtuvayre. Nothing further needed.

## 2023-09-03 NOTE — Telephone Encounter (Signed)
Patient is returning phone call. Patient phone number is [phone].

## 2023-09-09 ENCOUNTER — Ambulatory Visit (INDEPENDENT_AMBULATORY_CARE_PROVIDER_SITE_OTHER): Payer: No Typology Code available for payment source

## 2023-09-09 DIAGNOSIS — Z780 Asymptomatic menopausal state: Secondary | ICD-10-CM

## 2023-09-09 DIAGNOSIS — Z0000 Encounter for general adult medical examination without abnormal findings: Secondary | ICD-10-CM | POA: Diagnosis not present

## 2023-09-09 NOTE — Patient Instructions (Addendum)
Brandi [name] , Thank you for taking time to come for your Medicare Wellness Visit. I appreciate your ongoing commitment to your health goals. Please review the following plan we discussed and let me know if I can assist you in the future.   Referrals/Orders/Follow-Ups/Clinician Recommendations: ordered bone density scan  This is a list of the screening recommended for you and due dates:  Health Maintenance  Topic Date Due   Pneumonia Vaccine (1 of 2 - PCV) Never done   DTaP/Tdap/Td vaccine (1 - Tdap) Never done   Colon Cancer Screening  Never done   Zoster (Shingles) Vaccine (1 of 2) Never done   COVID-19 Vaccine (3 - 2023-24 season) 08/08/2023   Mammogram  12/19/2023   Screening for Lung Cancer  05/24/2024   Medicare Annual Wellness Visit  09/08/2024   DEXA scan (bone density measurement)  Completed   Hepatitis C Screening  Completed   HPV Vaccine  Aged Out   Flu Shot  Discontinued    Advanced directives: (ACP Link)Information on Advanced Care Planning can be found at [facility] [place] Advance Health Care Directives Advance Health Care Directives ([email or web address]   Next Medicare Annual Wellness Visit scheduled for next year: Yes    09/14/24 @ 10:55 am in person

## 2023-09-09 NOTE — Progress Notes (Signed)
Subjective:   Brandi [name] is a 67 y.o. female who presents for Medicare Annual (Subsequent) preventive examination.  Visit Complete: Virtual  I connected with  Brandi [name] on 09/09/23 by a audio enabled telemedicine application and verified that I am speaking with the correct person using two identifiers.  Patient Location: Home  Provider Location: Office/Clinic  I discussed the limitations of evaluation and management by telemedicine. The patient expressed understanding and agreed to proceed.  Because this visit was a virtual/telehealth visit, some criteria may be missing or patient reported. Any vitals not documented were not able to be obtained and vitals that have been documented are patient reported.   Cardiac Risk Factors include: advanced age (>82men, >47 women);dyslipidemia     Objective:    There were no vitals filed for this visit. There is no height or weight on file to calculate BMI.     09/09/2023    1:35 PM 05/22/2023   11:59 AM 03/02/2023    5:55 PM 10/30/2022   10:36 AM 07/08/2021   10:39 AM 12/16/2020    7:44 AM 06/26/2015   11:37 AM  Advanced Directives  Does Patient Have a Medical Advance Directive? No No No Yes Yes No No  Type of Advance Directive    Living will;Healthcare Power of [employer] Power of [place];Living will    Copy of Healthcare Power of Attorney in Chart?     No - copy requested    Would patient like information on creating a medical advance directive? No - Patient declined     No - Patient declined Yes - Educational materials given    Current Medications (verified) Outpatient Encounter Medications as of 09/09/2023  Medication Sig   albuterol (PROVENTIL) (2.5 MG/3ML) 0.083% nebulizer solution USE 1 VIAL VIA NEBULIZER EVERY 6 HOURS AS NEEDED FOR WHEEZING OR SHORTNESS OF BREATH   albuterol (VENTOLIN HFA) 108 (90 Base) MCG/ACT inhaler INHALE 2 PUFFS INTO THE LUNGS EVERY 4 HOURS AS NEEDED FOR WHEEZING OR SHORTNESS OF BREATH   aspirin  EC 81 MG tablet Take 1 tablet (81 mg total) by mouth daily.   Budeson-Glycopyrrol-Formoterol (BREZTRI AEROSPHERE) 160-9-4.8 MCG/ACT AERO Inhale 2 puffs into the lungs in the morning and at bedtime.   Budeson-Glycopyrrol-Formoterol (BREZTRI AEROSPHERE) 160-9-4.8 MCG/ACT AERO Inhale 2 puffs into the lungs in the morning and at bedtime.   busPIRone (BUSPAR) 5 MG tablet TAKE 1 TABLET BY MOUTH TWICE DAILY AS NEEDED FOR ANXIETY   Calcium Carbonate-Vit D-Min (CALCIUM 1200) 1200-1000 MG-UNIT CHEW Chew 1,200 mg by mouth daily.   Cholecalciferol (VITAMIN D3) 125 MCG (5000 UT) TABS Take by mouth. Vitamin D3 5,000 IU daily for 12 weeks then reduce to OTC Vitamin D3 2,000 IU daily for maintenance   ezetimibe (ZETIA) 10 MG tablet Take 1 tablet (10 mg total) by mouth daily.   famotidine (PEPCID) 20 MG tablet Take 1 tablet (20 mg total) by mouth 2 (two) times daily as needed.   feeding supplement (ENSURE ENLIVE / ENSURE PLUS) LIQD Take 237 mLs by mouth 2 (two) times daily between meals.   mometasone (ELOCON) 0.1 % lotion Apply topically.   Multiple Vitamin (MULTIVITAMIN WITH MINERALS) TABS tablet Take 1 tablet by mouth daily.   OHTUVAYRE 3 MG/2.5ML SUSP Inhale 3 mg into the lungs 2 (two) times daily.   omeprazole (PRILOSEC) 20 MG capsule Take 1 capsule (20 mg total) by mouth 2 (two) times daily before a meal.   OXYGEN Inhale 3 L into the lungs at bedtime as  needed.   rosuvastatin (CRESTOR) 10 MG tablet Take 1 tablet (10 mg total) by mouth daily.   sodium chloride (OCEAN) 0.65 % SOLN nasal spray Place 1 spray into both nostrils as needed for congestion.   Specialty Vitamins Products (HAIR NOURISHING SUPPLEMENT PO) Take 1 tablet by mouth daily.   triamcinolone cream (KENALOG) 0.5 % Apply 1 Application topically 2 (two) times daily as needed (allergic reaction on skin swelling). To affected areas, for up to 2 weeks.   cetirizine (ZYRTEC ALLERGY) 10 MG tablet Take 1 tablet (10 mg total) by mouth daily for 7 days.    montelukast (SINGULAIR) 10 MG tablet TAKE 1 TABLET BY MOUTH AT BEDTIME   ondansetron (ZOFRAN-ODT) 4 MG disintegrating tablet Take 1 tablet (4 mg total) by mouth every 8 (eight) hours as needed for nausea or vomiting. (Patient not taking: Reported on 09/09/2023)   predniSONE (STERAPRED UNI-PAK 21 TAB) 10 MG (21) TBPK tablet Take by mouth daily. Take 6 tabs by mouth daily for 1, then 5 tabs for 1 day, then 4 tabs for 1 day, then 3 tabs for 1 day, then 2 tabs for 1 day, then 1 tab for 1 day. (Patient not taking: Reported on 07/23/2023)   tiZANidine (ZANAFLEX) 2 MG tablet Take 1 tablet (2 mg total) by mouth every 8 (eight) hours as needed for muscle spasms. (Patient not taking: Reported on 09/09/2023)   No facility-administered encounter medications on file as of 09/09/2023.    Allergies (verified) Patient has no known allergies.   History: Past Medical History:  Diagnosis Date   COPD (chronic obstructive pulmonary disease) (HCC)    Hyperlipidemia    Past Surgical History:  Procedure Laterality Date   APPENDECTOMY     CHOLECYSTECTOMY     SHOULDER ARTHROSCOPY Left 07/02/2015   Procedure: ,left shoulder arthroscopy, decompression and debridement;  Surgeon: Christena Flake, MD;  Location: [facility] OR[facility];  Service: Orthopedics;  Laterality: Left;   SHOULDER CLOSED REDUCTION Left 04/09/2015   Procedure: CLOSED MANIPULATION SHOULDER;  Surgeon: Kennedy Bucker, MD;  Location: [facility];  Service: Orthopedics;  Laterality: Left;   Family History  Problem Relation Age of Onset   Breast cancer Mother 50   Alzheimer's disease Mother    Social History   Socioeconomic History   Marital status: Widowed    Spouse name: Not on file   Number of children: 1   Years of education: Not on file   Highest education level: Not on file  Occupational History   Occupation: Retired  Tobacco Use   Smoking status: Former    Current packs/day: 0.00    Average packs/day: 0.8 packs/day for 41.0 years (30.8 ttl pk-yrs)    Types:  Cigarettes    Start date: 12/07/1974    Quit date: 12/08/2015    Years since quitting: 7.7   Smokeless tobacco: Never  Vaping Use   Vaping status: Never Used  Substance and Sexual Activity   Alcohol use: No   Drug use: No   Sexual activity: Yes    Birth control/protection: Post-menopausal  Other Topics Concern   Not on file  Social History Narrative   Not on file   Social Determinants of Health   Financial Resource Strain: Low Risk  (09/09/2023)   Overall Financial Resource Strain (CARDIA)    Difficulty of Paying Living Expenses: Not very hard  Food Insecurity: No Food Insecurity (09/09/2023)   Hunger Vital Sign    Worried About Running Out of Food in the Last Year:  Never true    Ran Out of Food in the Last Year: Never true  Transportation Needs: No Transportation Needs (09/09/2023)   PRAPARE - Administrator, Civil Service (Medical): No    Lack of Transportation (Non-Medical): No  Physical Activity: Sufficiently Active (09/09/2023)   Exercise Vital Sign    Days of Exercise per Week: 7 days    Minutes of Exercise per Session: 30 min  Stress: No Stress Concern Present (09/09/2023)   [employer] of Occupational Health - Occupational Stress Questionnaire    Feeling of Stress : Only a little  Social Connections: Moderately Isolated (09/09/2023)   Social Connection and Isolation Panel [NHANES]    Frequency of Communication with Friends and Family: More than three times a week    Frequency of Social Gatherings with Friends and Family: More than three times a week    Attends Religious Services: More than 4 times per year    Active Member of [employer] or Organizations: No    Attends Banker Meetings: Never    Marital Status: Widowed    Tobacco Counseling Counseling given: Not Answered   Clinical Intake:  Pre-visit preparation completed: Yes  Pain : No/denies pain     Nutritional Status: BMI of 19-24  Normal Nutritional Risks: None Diabetes:  No  How often do you need to have someone help you when you read instructions, pamphlets, or other written materials from your doctor or pharmacy?: 1 - Never  Interpreter Needed?: No  Information entered by :: Kennedy Bucker, LPN   Activities of Daily Living    09/09/2023    1:36 PM 03/26/2023   10:03 AM  In your present state of health, do you have any difficulty performing the following activities:  Hearing? 0 0  Vision? 0 0  Difficulty concentrating or making decisions? 0 0  Walking or climbing stairs? 1 0  Comment breathing   Dressing or bathing? 0 0  Doing errands, shopping? 0 0  Preparing Food and eating ? N   Using the Toilet? N   In the past six months, have you accidently leaked urine? N   Do you have problems with loss of bowel control? N   Managing your Medications? N   Managing your Finances? N   Housekeeping or managing your Housekeeping? N     Patient Care Team: [name], DO as PCP - General (Family Medicine) [name], MD as Consulting Physician (Pulmonary Disease) Marlowe Sax, RN as Case Manager (General Practice)  Indicate any recent Medical Services you may have received from other than [facility] providers in the past year (date may be approximate).     Assessment:   This is a routine wellness examination for Juna.  Hearing/Vision screen Hearing Screening - Comments:: No aids Vision Screening - Comments:: Readers- Dr.Woodard    Goals Addressed             This Visit's Progress    DIET - EAT MORE FRUITS AND VEGETABLES         Depression Screen    09/09/2023    1:33 PM 03/26/2023   10:03 AM 03/03/2023    3:24 PM 08/04/2022    1:15 PM 02/12/2022   10:42 AM 08/12/2021   11:07 AM 07/08/2021   10:40 AM  PHQ 2/9 Scores  PHQ - 2 Score 0 0 1 4 0 0 0  PHQ- 9 Score 0 0  6 0 0     Fall Risk  09/09/2023    1:36 PM 03/26/2023   10:03 AM 03/25/2023   10:15 AM 03/03/2023    3:50 PM 08/04/2022    1:23 PM  Fall Risk   Falls in  the past year? 0 0 0 0 0  Number falls in past yr: 0 0 0 0 0  Injury with Fall? 0 0 0 0 0  Risk for fall due to : No Fall Risks No Fall Risks No Fall Risks No Fall Risks No Fall Risks  Follow up Falls prevention discussed;Falls evaluation completed Falls evaluation completed Falls evaluation completed;Education provided Falls evaluation completed;Education provided;Falls prevention discussed Falls evaluation completed    MEDICARE RISK AT HOME: Medicare Risk at Home Any stairs in or around the home?: Yes If so, are there any without handrails?: No Home free of loose throw rugs in walkways, pet beds, electrical cords, etc?: Yes Adequate lighting in your home to reduce risk of falls?: Yes Life alert?: No Use of a cane, walker or w/c?: No Grab bars in the bathroom?: Yes Shower chair or bench in shower?: Yes Elevated toilet seat or a handicapped toilet?: Yes  TIMED UP AND GO:  Was the test performed?  No    Cognitive Function:        09/09/2023    1:37 PM 08/04/2022    1:24 PM 07/08/2021   10:41 AM  6CIT Screen  What Year? 0 points 0 points 0 points  What month? 0 points 0 points 0 points  What time? 0 points 0 points 0 points  Count back from 20 0 points 0 points 2 points  Months in reverse 2 points 0 points 2 points  Repeat phrase 0 points 2 points 0 points  Total Score 2 points 2 points 4 points    Immunizations Immunization History  Administered Date(s) Administered   Moderna Sars-Covid-2 Vaccination 05/26/2021, 06/24/2021    TDAP status: Due, Education has been provided regarding the importance of this vaccine. Advised may receive this vaccine at local pharmacy or Health Dept. Aware to provide a copy of the vaccination record if obtained from local pharmacy or Health Dept. Verbalized acceptance and understanding.  Flu Vaccine status: Declined, Education has been provided regarding the importance of this vaccine but patient still declined. Advised may receive this vaccine  at local pharmacy or Health Dept. Aware to provide a copy of the vaccination record if obtained from local pharmacy or Health Dept. Verbalized acceptance and understanding.  Pneumococcal vaccine status: Declined,  Education has been provided regarding the importance of this vaccine but patient still declined. Advised may receive this vaccine at local pharmacy or Health Dept. Aware to provide a copy of the vaccination record if obtained from local pharmacy or Health Dept. Verbalized acceptance and understanding.   Covid-19 vaccine status: Completed vaccines  Qualifies for Shingles Vaccine? Yes   Zostavax completed No   Shingrix Completed?: No.    Education has been provided regarding the importance of this vaccine. Patient has been advised to call insurance company to determine out of pocket expense if they have not yet received this vaccine. Advised may also receive vaccine at local pharmacy or Health Dept. Verbalized acceptance and understanding.  Screening Tests Health Maintenance  Topic Date Due   Pneumonia Vaccine 85+ Years old (1 of 2 - PCV) Never done   DTaP/Tdap/Td (1 - Tdap) Never done   Colonoscopy  Never done   Zoster Vaccines- Shingrix (1 of 2) Never done   COVID-19 Vaccine (3 - 2023-24  season) 08/08/2023   MAMMOGRAM  12/19/2023   Lung Cancer Screening  05/24/2024   Medicare Annual Wellness (AWV)  09/08/2024   DEXA SCAN  Completed   Hepatitis C Screening  Completed   HPV VACCINES  Aged Out   INFLUENZA VACCINE  Discontinued    Health Maintenance  Health Maintenance Due  Topic Date Due   Pneumonia Vaccine 74+ Years old (1 of 2 - PCV) Never done   DTaP/Tdap/Td (1 - Tdap) Never done   Colonoscopy  Never done   Zoster Vaccines- Shingrix (1 of 2) Never done   COVID-19 Vaccine (3 - 2023-24 season) 08/08/2023    Declined referral for colonoscopy  Mammogram status: Completed 12/18/21. Repeat every year- has appointment coming up  Bone Density status: Completed 01/04/19.  Results reflect: Bone density results: OSTEOPOROSIS. Repeat every 2 years.  Lung Cancer Screening: (Low Dose CT Chest recommended if Age 81-80 years, 20 pack-year currently smoking OR have quit w/in 15years.) does not qualify.    Additional Screening:  Hepatitis C Screening: does qualify; Completed 01/20/21  Vision Screening: Recommended annual ophthalmology exams for early detection of glaucoma and other disorders of the eye. Is the patient up to date with their annual eye exam?  Yes  Who is the provider or what is the name of the office in which the patient attends annual eye exams? Dr.Woodard If pt is not established with a provider, would they like to be referred to a provider to establish care? No .   Dental Screening: Recommended annual dental exams for proper oral hygiene   Community Resource Referral / Chronic Care Management: CRR required this visit?  No   CCM required this visit?  No     Plan:     I have personally reviewed and noted the following in the patient's chart:   Medical and social history Use of alcohol, tobacco or illicit drugs  Current medications and supplements including opioid prescriptions. Patient is not currently taking opioid prescriptions. Functional ability and status Nutritional status Physical activity Advanced directives List of other physicians Hospitalizations, surgeries, and ER visits in previous 12 months Vitals Screenings to include cognitive, depression, and falls Referrals and appointments  In addition, I have reviewed and discussed with patient certain preventive protocols, quality metrics, and best practice recommendations. A written personalized care plan for preventive services as well as general preventive health recommendations were provided to patient.     Hal Hope, LPN   [id number]/08/[id number]   After Visit Summary: (MyChart) Due to this being a telephonic visit, the after visit summary with patients personalized plan was  offered to patient via MyChart   Nurse Notes: none

## 2023-09-13 ENCOUNTER — Ambulatory Visit: Payer: Self-pay | Admitting: *Deleted

## 2023-09-13 ENCOUNTER — Ambulatory Visit: Payer: Self-pay

## 2023-09-13 DIAGNOSIS — Z008 Encounter for other general examination: Secondary | ICD-10-CM | POA: Diagnosis not present

## 2023-09-13 DIAGNOSIS — J441 Chronic obstructive pulmonary disease with (acute) exacerbation: Secondary | ICD-10-CM | POA: Diagnosis not present

## 2023-09-13 NOTE — Telephone Encounter (Signed)
  Chief Complaint: Pt calling in wanting to come in and get an x ray today for her lungs.  I let her know she would need an appt.   She said her [facility] Nurse was calling in to talk with Dr. Althea Charon about her getting an x ray today. Symptoms: My nurse thinks I may have pneumonia. Frequency: Today Pertinent Negatives: Patient denies N/A Disposition: [] ED /[] Urgent Care (no appt availability in office) / [] Appointment(In office/virtual)/ []  [place] [facility]/ [] Home Care/ [] Refused Recommended Disposition /[]  Mobile Bus/ [x]  Follow-up with PCP Additional Notes: I've sent a message to the practice letting them her desire for an x ray and that her home health  nurse is supposed to call in.   I believe her nurse called into the office.

## 2023-09-13 NOTE — Telephone Encounter (Signed)
My D Reason for Disposition  [1] Caller requesting NON-URGENT health information AND [2] PCP's office is the best resource    Wanting to come get an x ray today.   [facility] Nurse called in a few min.but not sure what  conversation was for with another nurse.  Answer Assessment - Initial Assessment Questions 1. REASON FOR CALL or QUESTION: "What is your reason for calling today?" or "How can I best help you?" or "What question do you have that I can help answer?"     I was calling to see if I could come in and get an x ray today?  Do y'all do walk ins?   I let her know we do not do walk ins.     I'm sending a message to the office.  Protocols used: Information Only Call - No Triage-A-AH

## 2023-09-13 NOTE — Telephone Encounter (Signed)
  Chief Complaint: Received call from [facility] names [name] that pt  Symptoms: Per [name]: Congestion, COPD exacerbation Frequency: stated saw pt at visit on 08/12/23, 08/25/23 and 09/13/23 Disposition: [] ED /[] Urgent Care (no appt availability in office) / [] Appointment(In office/virtual)/ []  [place] [facility]/ [] Home Care/ [] Refused Recommended Disposition /[] [place] Mobile [facility]/ []  Follow-up with PCP Additional Notes: attempted times x 1 to reach pt and unsuccessful.  Answer Assessment - Initial Assessment Questions 1. RESPIRATORY STATUS: "Describe your breathing?" (e.g., wheezing, shortness of breath, unable to speak, severe coughing)      SOB 2. ONSET: "When did this breathing problem begin?"      Per [name] since 08/12/23 7. LUNG HISTORY: "Do you have any history of lung disease?"  (e.g., pulmonary embolus, asthma, emphysema)     COPD  Protocols used: Breathing Difficulty-A-AH

## 2023-09-13 NOTE — Progress Notes (Unsigned)
Cardiology Office Note  Date:  09/14/2023   ID:  Brandi [name], DOB 1956/09/22, MRN [id number]  PCP:  Brandi Cords, DO   Cc: acute COPD exerbation  HPI:  [name] is a 67 year old woman with past medical history of Former smoker, 31 years, quit  Aortic atherosclerosis, moderate multivessel coronary calcification on CT Lost husband from covid 12/2020 She had covid in jan 2022 Who presents for follow-up of her atherosclerosis, CAD  LOV Jan 2023  On discussion today, she reports being SOB since sept 24 Followed by pulmonary Reports between primary care and nurses associated with her insurance, she has completed ABX x2, prednisone Starting 3rd round Levaquin with prednisone Chest x-ray with no pneumonia Also using nebs, inhalers Thick Cough loose and phlegmy with yellow sputum  Frustrated with length of bronchitis episode of Reports she is not currently smoking  Denies chest pain concerning for angina  Reports having high expenses for 67 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  1 run of Supraventricular Tachycardia occurred lasting 15 beats with a max rate of 139 bpm (avg 120 bpm). Isolated SVEs were occasional (2.4%, 7076), SVE Couplets were rare (<1.0%, 13), and SVE Triplets were rare (<1.0%, 2).  Isolated VEs were rare (<1.0%), and no VE Couplets or VE Triplets were present.  Patient triggered events (2) not associated with significant  arrhythmia, artifact noted  Echo, reviewed  1. Left ventricular ejection fraction, by estimation, is 60 to 65%. The  left ventricle has normal function. The left ventricle has no regional  wall motion abnormalities. Left ventricular diastolic parameters are  consistent with Grade I diastolic  dysfunction (impaired relaxation). The average left ventricular global  longitudinal strain is -18.5 %. The global longitudinal strain is normal.   2. Right ventricular systolic function is normal. The right ventricular  size is normal. There is normal pulmonary artery systolic pressure. The  estimated right ventricular systolic pressure is 29.2 mmHg.   Other past medical history reviewed CT scan January 2020 Multivessel coronary artery atherosclerosis noted Aortic atherosclerosis CT scan documenting moderate emphysema, bullae Atherosclerosis is mild in the arch Coronary artery calcification appears moderate   PMH:    Aortic atherosclerosis Coronary calcification Smoker   PSH:    Past Surgical History:  Procedure Laterality Date   APPENDECTOMY     CHOLECYSTECTOMY     SHOULDER ARTHROSCOPY Left 07/02/2015   Procedure: ,left shoulder arthroscopy, decompression and debridement;  Surgeon: Christena Flake, MD;  Location: [facility];  Service: Orthopedics;  Laterality: Left;   SHOULDER CLOSED REDUCTION Left 04/09/2015   Procedure: CLOSED MANIPULATION SHOULDER;  Surgeon: Kennedy Bucker, MD;  Location: [facility];  Service: Orthopedics;  Laterality: Left;    Current Outpatient Medications  Medication Sig Dispense Refill   albuterol (PROVENTIL) (2.5 MG/3ML) 0.083% nebulizer solution USE 1 VIAL VIA NEBULIZER EVERY 6 HOURS AS NEEDED FOR WHEEZING OR SHORTNESS OF BREATH 75 mL 12   albuterol (VENTOLIN HFA) 108 (90 Base)  MCG/ACT inhaler INHALE 2 PUFFS INTO THE LUNGS EVERY 4 HOURS AS NEEDED FOR WHEEZING OR SHORTNESS OF BREATH 8.5 g 2   aspirin EC 81 MG tablet Take 1 tablet (81 mg total) by mouth daily. 90 tablet 3    Budeson-Glycopyrrol-Formoterol (BREZTRI AEROSPHERE) 160-9-4.8 MCG/ACT AERO Inhale 2 puffs into the lungs in the morning and at bedtime. 11.8 g 0   busPIRone (BUSPAR) 5 MG tablet TAKE 1 TABLET BY MOUTH TWICE DAILY AS NEEDED FOR ANXIETY 180 tablet 1   Calcium Carbonate-Vit D-Min (CALCIUM 1200) 1200-1000 MG-UNIT CHEW Chew 1,200 mg by mouth daily. 30 each 11   cetirizine (ZYRTEC ALLERGY) 10 MG tablet Take 1 tablet (10 mg total) by mouth daily for 7 days. 7 tablet 0   Cholecalciferol (VITAMIN D3) 125 MCG (5000 UT) TABS Take by mouth. Vitamin D3 5,000 IU daily for 12 weeks then reduce to OTC Vitamin D3 2,000 IU daily for maintenance     ezetimibe (ZETIA) 10 MG tablet Take 1 tablet (10 mg total) by mouth daily. 90 tablet 3   famotidine (PEPCID) 20 MG tablet Take 1 tablet (20 mg total) by mouth 2 (two) times daily as needed. 180 tablet 1   feeding supplement (ENSURE ENLIVE / ENSURE PLUS) LIQD Take 237 mLs by mouth 2 (two) times daily between meals. (Patient taking differently: Take 237 mLs by mouth as needed.) 237 mL 12   levofloxacin (LEVAQUIN) 750 MG tablet Take 750 mg by mouth daily.     mometasone (ELOCON) 0.1 % lotion Apply topically.     montelukast (SINGULAIR) 10 MG tablet TAKE 1 TABLET BY MOUTH AT BEDTIME 90 tablet 0   Multiple Vitamin (MULTIVITAMIN WITH MINERALS) TABS tablet Take 1 tablet by mouth daily.     OHTUVAYRE 3 MG/2.5ML SUSP Inhale 3 mg into the lungs 2 (two) times daily. 150 mL 11   omeprazole (PRILOSEC) 20 MG capsule Take 1 capsule (20 mg total) by mouth 2 (two) times daily before a meal. 180 capsule 3   ondansetron (ZOFRAN-ODT) 4 MG disintegrating tablet Take 1 tablet (4 mg total) by mouth every 8 (eight) hours as needed for nausea or vomiting. 30 tablet 0   OXYGEN Inhale 3 L into the lungs at bedtime as needed.     predniSONE (STERAPRED UNI-PAK 21 TAB) 10 MG (21) TBPK tablet Take by mouth daily. Take 6 tabs by mouth daily for 1, then 5 tabs for 1 day, then 4 tabs for 1 day, then 3  tabs for 1 day, then 2 tabs for 1 day, then 1 tab for 1 day. 21 tablet 0   rosuvastatin (CRESTOR) 10 MG tablet Take 1 tablet (10 mg total) by mouth daily. 90 tablet 3   sodium chloride (OCEAN) 0.65 % SOLN nasal spray Place 1 spray into both nostrils as needed for congestion.     Specialty Vitamins Products (HAIR NOURISHING SUPPLEMENT PO) Take 1 tablet by mouth daily.     tiZANidine (ZANAFLEX) 2 MG tablet Take 1 tablet (2 mg total) by mouth every 8 (eight) hours as needed for muscle spasms. 60 tablet 2   triamcinolone cream (KENALOG) 0.5 % Apply 1 Application topically 2 (two) times daily as needed (allergic reaction on skin swelling). To affected areas, for up to 2 weeks. 30 g 2   No current facility-administered medications for this visit.    Allergies:   Patient has no known allergies.   Social History:  The patient  reports that she quit smoking about 7 years ago.  Her smoking use included cigarettes. She started smoking about 48 years ago. She has a 30.8 pack-year smoking history. She has never used smokeless tobacco. She reports that she does not drink alcohol and does not use drugs.   Family History:   family history includes Alzheimer's disease in her mother; Breast cancer (age of onset: 85) in her mother.    Review of Systems: Review of Systems  Constitutional: Negative.   HENT: Negative.    Respiratory: Negative.    Cardiovascular: Negative.   Gastrointestinal: Negative.   Musculoskeletal: Negative.   Neurological: Negative.   Psychiatric/Behavioral:  The patient is nervous/anxious.   All other systems reviewed and are negative.   PHYSICAL EXAM: VS:  BP (!) 102/58   Pulse (!) 104   Ht 5\' 4"  (1.626 m)   Wt 120 lb 9.6 oz (54.7 kg)   SpO2 93%   BMI 20.70 kg/m  , BMI Body mass index is 20.7 kg/m. Constitutional:  oriented to person, place, and time. No distress.  HENT:  Head: Grossly normal Eyes:  no discharge. No scleral icterus.  Neck: No JVD, no carotid bruits   Cardiovascular: Regular rate and rhythm, no murmurs appreciated Pulmonary/Chest: Clear to auscultation bilaterally, no wheezes or rails Abdominal: Soft.  no distension.  no tenderness.  Musculoskeletal: Normal range of motion Neurological:  normal muscle tone. Coordination normal. No atrophy Skin: Skin warm and dry Psychiatric: normal affect, pleasant  Recent Labs: 03/01/2023: BUN 12; Creatinine, Ser 0.61; Hemoglobin 13.8; Platelets 288; Potassium 3.7; Sodium 138 03/26/2023: TSH 1.32    Lipid Panel Lab Results  Component Value Date   CHOL 165 12/22/2021   HDL 63 12/22/2021   LDLCALC 89 12/22/2021   TRIG 64 12/22/2021      Wt Readings from Last 3 Encounters:  09/14/23 120 lb 9.6 oz (54.7 kg)  09/14/23 119 lb (54 kg)  07/23/23 121 lb 6.4 oz (55.1 kg)      ASSESSMENT AND PLAN:  Aortic atherosclerosis (HCC) -  Quit smoking, on crestor and Zetia Goal LDL less than 70  Coronary disease with stable angina Coronary artery calcification seen on CT scan -  Lexiscan Myoview last year with no significant ischemia Denies symptoms concerning for angina  Former smoker -  Smoked for 31 years, stopped  >3 years ago Recent COPD flare/acute on chronic bronchitis Currently on Levaquin and prednisone taper  Mixed hyperlipidemia - Continue Crestor daily with Zetia  SOB/Emphysema Moderate on CT scan  moderate to severe emphysema On third round antibiotics with prednisone taper, has close follow-up with pulmonary   Total encounter time more than 30 minutes  Greater than 50% was spent in counseling and coordination of care with the patient   Orders Placed This Encounter  Procedures   EKG 12-Lead     Signed, Dossie Arbour, M.D., Ph.D. 09/14/2023  [facility] [place], [place] [phone]

## 2023-09-13 NOTE — Telephone Encounter (Addendum)
  Chief Complaint: continued SOB and congestion Symptoms: Pt sick since September - now with worsening SOB and h/o COPD.  Frequency: Since Sept  Pertinent Negatives: Patient denies fever Disposition: [] ED /[] Urgent Care (no appt availability in office) / [] Appointment(In office/virtual)/ []  [facility]/ [] Home Care/ [] Refused Recommended Disposition /[] [facility]/ []  Follow-up with PCP Additional Notes: pt upset and wanting to get cxr. . Attempted to get in touch with [name] but unable to . Noted appt in chart for pt tomorrow with Nicki Reaper NP. Reason for Disposition  [1] Longstanding difficulty breathing (e.g., CHF, COPD, emphysema) AND [2] WORSE than normal  Answer Assessment - Initial Assessment Questions 1. RESPIRATORY STATUS: "Describe your breathing?" (e.g., wheezing, shortness of breath, unable to speak, severe coughing)      SOB  2. ONSET: "When did this breathing problem begin?"      SOB worse lately  3. PATTERN "Does the difficult breathing come and go, or has it been constant since it started?"      Comes and goes and she eats  4. SEVERITY: "How bad is your breathing?" (e.g., mild, moderate, severe)    - MILD: No SOB at rest, mild SOB with walking, speaks normally in sentences, can lie down, no retractions, pulse < 100.    - MODERATE: SOB at rest, SOB with minimal exertion and prefers to sit, cannot lie down flat, speaks in phrases, mild retractions, audible wheezing, pulse 100-120.  moderate   - SEVERE: Very SOB at rest, speaks in single words, struggling to breathe, sitting hunched forward, retractions, pulse > 120      moderate 5. RECURRENT SYMPTOM: "Have you had difficulty breathing before?" If Yes, ask: "When was the last time?" and "What happened that time?"      yes 7. LUNG HISTORY: "Do you have any history of lung disease?"  (e.g., pulmonary embolus, asthma, emphysema)     COPD  8. CAUSE: "What do you think is causing the breathing problem?"       infection 9. OTHER SYMPTOMS: "Do you have any other symptoms? (e.g., dizziness, runny nose, cough, chest pain, fever)     Yellow green phlegm  117/69 HR 89  10. O2 SATURATION MONITOR:  "Do you use an oxygen saturation monitor (pulse oximeter) at home?" If Yes, ask: "What is your reading (oxygen level) today?" "What is your usual oxygen saturation reading?" (e.g., 95%)       93-94  Protocols used: Breathing Difficulty-A-AH

## 2023-09-14 ENCOUNTER — Ambulatory Visit
Admission: RE | Admit: 2023-09-14 | Discharge: 2023-09-14 | Disposition: A | Discharge status: Home / Self Care | Payer: No Typology Code available for payment source | Attending: Family | Admitting: Family

## 2023-09-14 ENCOUNTER — Other Ambulatory Visit: Payer: Self-pay | Admitting: Family

## 2023-09-14 ENCOUNTER — Encounter: Payer: Self-pay | Admitting: Internal Medicine

## 2023-09-14 ENCOUNTER — Ambulatory Visit (INDEPENDENT_AMBULATORY_CARE_PROVIDER_SITE_OTHER): Payer: No Typology Code available for payment source | Admitting: Internal Medicine

## 2023-09-14 ENCOUNTER — Encounter: Payer: Self-pay | Admitting: Cardiovascular Disease

## 2023-09-14 ENCOUNTER — Ambulatory Visit (INDEPENDENT_AMBULATORY_CARE_PROVIDER_SITE_OTHER): Payer: No Typology Code available for payment source | Admitting: Cardiovascular Disease

## 2023-09-14 ENCOUNTER — Ambulatory Visit
Admission: RE | Admit: 2023-09-14 | Discharge: 2023-09-14 | Disposition: A | Discharge status: Home / Self Care | Payer: No Typology Code available for payment source | Source: Ambulatory Visit | Attending: Family | Admitting: Family

## 2023-09-14 VITALS — BP 102/58 | HR 104 | Ht 64.0 in | Wt 120.6 lb

## 2023-09-14 VITALS — BP 102/66 | HR 89 | Temp 95.1°F | Wt 119.0 lb

## 2023-09-14 DIAGNOSIS — J449 Chronic obstructive pulmonary disease, unspecified: Secondary | ICD-10-CM | POA: Diagnosis not present

## 2023-09-14 DIAGNOSIS — E782 Mixed hyperlipidemia: Secondary | ICD-10-CM | POA: Diagnosis not present

## 2023-09-14 DIAGNOSIS — I700 Atherosclerosis of aorta: Secondary | ICD-10-CM | POA: Diagnosis not present

## 2023-09-14 DIAGNOSIS — R059 Cough, unspecified: Secondary | ICD-10-CM

## 2023-09-14 DIAGNOSIS — I2089 Other forms of angina pectoris: Secondary | ICD-10-CM | POA: Diagnosis not present

## 2023-09-14 DIAGNOSIS — J432 Centrilobular emphysema: Secondary | ICD-10-CM | POA: Diagnosis not present

## 2023-09-14 DIAGNOSIS — R001 Bradycardia, unspecified: Secondary | ICD-10-CM | POA: Diagnosis not present

## 2023-09-14 DIAGNOSIS — J439 Emphysema, unspecified: Secondary | ICD-10-CM | POA: Diagnosis not present

## 2023-09-14 DIAGNOSIS — R0602 Shortness of breath: Secondary | ICD-10-CM | POA: Diagnosis not present

## 2023-09-14 DIAGNOSIS — Z87891 Personal history of nicotine dependence: Secondary | ICD-10-CM | POA: Diagnosis not present

## 2023-09-14 MED ORDER — EZETIMIBE 10 MG PO TABS: 10.0000 mg | ORAL_TABLET | Freq: Every day | ORAL | 3 refills | Status: DC

## 2023-09-14 MED ORDER — ROSUVASTATIN CALCIUM 10 MG PO TABS
10.0000 mg | ORAL_TABLET | Freq: Every day | ORAL | 3 refills | Status: AC
Start: 2023-09-14 — End: ?

## 2023-09-14 NOTE — Patient Instructions (Signed)
 Chronic Obstructive Pulmonary Disease Exacerbation  Chronic obstructive pulmonary disease (COPD) is a long-term (chronic) lung problem. When you have COPD, it can feel harder to breathe in or out. COPD exacerbation is a flare-up of symptoms when breathing gets worse and more treatment may be needed. Without treatment, flare-ups can be life-threatening. If they happen often, your lungs can become more damaged. What are the causes? Not taking your usual COPD medicines as told by your health care provider. A cold or the flu, which can cause infection in your lungs. Being exposed to things that make your breathing worse, such as: Smoke. Air pollution. Fumes. Dust. Allergies. Weather changes. What are the signs or symptoms? Symptoms do not get better or get worse even if you take your medicines as told by your provider. Symptoms may include: More shortness of breath. You may only be able to speak one or two words at a time. More coughing or mucus from your lungs. More wheezing or chest tightness. Being more tired and having less energy. Confusion. How is this diagnosed? This condition is diagnosed based on: Symptoms that get worse. Your medical history. A physical exam. You may also have tests, including: A chest X-ray. Blood or mucus tests. How is this treated? You may be able to stay home or you may need to go to the hospital. Treatment may include: Taking medicines. These may include: Inhalers. These have medicines in them that you breathe in. These may be more of what you already take or they may be new. Steroids. These reduce inflammation in the airways. These may be inhaled, taken by mouth, or given in an IV. Antibiotics. These treat infection. Using oxygen. Using a device to help you clear mucus. Follow these instructions at home: Medicines Take your medicines only as told by your provider. If you were given antibiotics or steroids, take them as told by your provider. Do  not stop taking them even if you start to feel better. Lifestyle Several times a day, wash your hands with soap and water for at least 20 seconds. If you cannot use soap and water, use hand sanitizer. This may help keep you from getting an infection. Avoid being around crowds or people who are sick. Do not smoke or use any products that contain nicotine or tobacco. If you need help quitting, ask your provider. Return to your normal activities when your provider says that it's safe. Use breathing methods to control your stress and catch your breath. How is this prevented? Follow your COPD action plan. The action plan tells you what to do if you're feeling good and what to do when you start feeling worse. Discuss the plan often with your provider. Make sure you get all the shots, also called vaccines, that your provider recommends. Ask your provider about a flu shot and a pneumonia shot. Use oxygen therapy if told by your provider. If you need home oxygen therapy, ask your provider how often to check your oxygen level with a device called an oximeter. Keep all follow-up visits to review your COPD action plan. Your provider will want to check on your condition often to keep you healthy and out of the hospital. Contact a health care provider if: Your COPD symptoms get worse. You have a fever or chills. You have trouble doing daily activities. You have trouble breathing even when you are resting. Get help right away if: You are short of breath and cannot: Talk in full sentences. Do normal activities. You have chest  pain. You feel confused. These symptoms may be an emergency. Call 911 right away. Do not wait to see if the symptoms will go away. Do not drive yourself to the hospital. This information is not intended to replace advice given to you by your health care provider. Make sure you discuss any questions you have with your health care provider. Document Revised: 02/08/2023 Document  Reviewed: 02/08/2023 Elsevier Patient Education  2024 [employer].

## 2023-09-14 NOTE — Progress Notes (Signed)
Subjective:    Patient ID: Brandi [name], female    DOB: March 04, 1956, 67 y.o.   MRN: [id number]  HPI  Discussed the use of AI scribe software for clinical note transcription with the patient, who gave verbal consent to proceed.  The patient, with a history of COPD, began experiencing respiratory symptoms approximately a month ago.  She reports nasal question, cough and shortness of breath.  The cough is productive of yellow/green mucus.  She denies headache, runny nose, ear pain, sore throat, nausea or vomiting.  She denies fever, chills or bodyaches.  Despite two rounds of antibiotics and prednisone prescribed since the beginning of September, the symptoms persisted and have recently worsened, prompting concern for possible pneumonia.    In addition to the current respiratory symptoms, the patient has been managing COPD with Breztri and albuterol, and recently started a new nebulizer medication. The patient denies any smoking history.  The patient's insurance company ordered a chest x-ray and initiated a course of prednisone and levaquin, which the patient started the day before the consultation. The patient is scheduled to see a pulmonologist in November.       Review of Systems     Past Medical History:  Diagnosis Date   COPD (chronic obstructive pulmonary disease) (HCC)    Hyperlipidemia     Current Outpatient Medications  Medication Sig Dispense Refill   albuterol (PROVENTIL) (2.5 MG/3ML) 0.083% nebulizer solution USE 1 VIAL VIA NEBULIZER EVERY 6 HOURS AS NEEDED FOR WHEEZING OR SHORTNESS OF BREATH 75 mL 12   albuterol (VENTOLIN HFA) 108 (90 Base) MCG/ACT inhaler INHALE 2 PUFFS INTO THE LUNGS EVERY 4 HOURS AS NEEDED FOR WHEEZING OR SHORTNESS OF BREATH 8.5 g 2   aspirin EC 81 MG tablet Take 1 tablet (81 mg total) by mouth daily. 90 tablet 3   Budeson-Glycopyrrol-Formoterol (BREZTRI AEROSPHERE) 160-9-4.8 MCG/ACT AERO Inhale 2 puffs into the lungs in the morning and at bedtime. 11.8  g 0   Budeson-Glycopyrrol-Formoterol (BREZTRI AEROSPHERE) 160-9-4.8 MCG/ACT AERO Inhale 2 puffs into the lungs in the morning and at bedtime. 10.7 g 11   busPIRone (BUSPAR) 5 MG tablet TAKE 1 TABLET BY MOUTH TWICE DAILY AS NEEDED FOR ANXIETY 180 tablet 1   Calcium Carbonate-Vit D-Min (CALCIUM 1200) 1200-1000 MG-UNIT CHEW Chew 1,200 mg by mouth daily. 30 each 11   cetirizine (ZYRTEC ALLERGY) 10 MG tablet Take 1 tablet (10 mg total) by mouth daily for 7 days. 7 tablet 0   Cholecalciferol (VITAMIN D3) 125 MCG (5000 UT) TABS Take by mouth. Vitamin D3 5,000 IU daily for 12 weeks then reduce to OTC Vitamin D3 2,000 IU daily for maintenance     ezetimibe (ZETIA) 10 MG tablet Take 1 tablet (10 mg total) by mouth daily. 90 tablet 3   famotidine (PEPCID) 20 MG tablet Take 1 tablet (20 mg total) by mouth 2 (two) times daily as needed. 180 tablet 1   feeding supplement (ENSURE ENLIVE / ENSURE PLUS) LIQD Take 237 mLs by mouth 2 (two) times daily between meals. 237 mL 12   mometasone (ELOCON) 0.1 % lotion Apply topically.     montelukast (SINGULAIR) 10 MG tablet TAKE 1 TABLET BY MOUTH AT BEDTIME 90 tablet 0   Multiple Vitamin (MULTIVITAMIN WITH MINERALS) TABS tablet Take 1 tablet by mouth daily.     OHTUVAYRE 3 MG/2.5ML SUSP Inhale 3 mg into the lungs 2 (two) times daily. 150 mL 11   omeprazole (PRILOSEC) 20 MG capsule Take 1 capsule (20 mg  total) by mouth 2 (two) times daily before a meal. 180 capsule 3   ondansetron (ZOFRAN-ODT) 4 MG disintegrating tablet Take 1 tablet (4 mg total) by mouth every 8 (eight) hours as needed for nausea or vomiting. (Patient not taking: Reported on 09/09/2023) 30 tablet 0   OXYGEN Inhale 3 L into the lungs at bedtime as needed.     predniSONE (STERAPRED UNI-PAK 21 TAB) 10 MG (21) TBPK tablet Take by mouth daily. Take 6 tabs by mouth daily for 1, then 5 tabs for 1 day, then 4 tabs for 1 day, then 3 tabs for 1 day, then 2 tabs for 1 day, then 1 tab for 1 day. (Patient not taking:  Reported on 07/23/2023) 21 tablet 0   rosuvastatin (CRESTOR) 10 MG tablet Take 1 tablet (10 mg total) by mouth daily. 90 tablet 3   sodium chloride (OCEAN) 0.65 % SOLN nasal spray Place 1 spray into both nostrils as needed for congestion.     Specialty Vitamins Products (HAIR NOURISHING SUPPLEMENT PO) Take 1 tablet by mouth daily.     tiZANidine (ZANAFLEX) 2 MG tablet Take 1 tablet (2 mg total) by mouth every 8 (eight) hours as needed for muscle spasms. (Patient not taking: Reported on 09/09/2023) 60 tablet 2   triamcinolone cream (KENALOG) 0.5 % Apply 1 Application topically 2 (two) times daily as needed (allergic reaction on skin swelling). To affected areas, for up to 2 weeks. 30 g 2   No current facility-administered medications for this visit.    No Known Allergies  Family History  Problem Relation Age of Onset   Breast cancer Mother 30   Alzheimer's disease Mother     Social History   Socioeconomic History   Marital status: Widowed    Spouse name: Not on file   Number of children: 1   Years of education: Not on file   Highest education level: Not on file  Occupational History   Occupation: Retired  Tobacco Use   Smoking status: Former    Current packs/day: 0.00    Average packs/day: 0.8 packs/day for 41.0 years (30.8 ttl pk-yrs)    Types: Cigarettes    Start date: 12/07/1974    Quit date: 12/08/2015    Years since quitting: 7.7   Smokeless tobacco: Never  Vaping Use   Vaping status: Never Used  Substance and Sexual Activity   Alcohol use: No   Drug use: No   Sexual activity: Yes    Birth control/protection: Post-menopausal  Other Topics Concern   Not on file  Social History Narrative   Not on file   Social Determinants of Health   Financial Resource Strain: Low Risk  (09/09/2023)   Overall Financial Resource Strain (CARDIA)    Difficulty of Paying Living Expenses: Not very hard  Food Insecurity: No Food Insecurity (09/09/2023)   Hunger Vital Sign    Worried About  Running Out of Food in the Last Year: Never true    Ran Out of Food in the Last Year: Never true  Transportation Needs: No Transportation Needs (09/09/2023)   PRAPARE - Administrator, Civil Service (Medical): No    Lack of Transportation (Non-Medical): No  Physical Activity: Sufficiently Active (09/09/2023)   Exercise Vital Sign    Days of Exercise per Week: 7 days    Minutes of Exercise per Session: 30 min  Stress: No Stress Concern Present (09/09/2023)   [employer] of Occupational Health - Occupational Stress Questionnaire  Feeling of Stress : Only a little  Social Connections: Moderately Isolated (09/09/2023)   Social Connection and Isolation Panel [NHANES]    Frequency of Communication with Friends and Family: More than three times a week    Frequency of Social Gatherings with Friends and Family: More than three times a week    Attends Religious Services: More than 4 times per year    Active Member of [employer] or Organizations: No    Attends Banker Meetings: Never    Marital Status: Widowed  Intimate Partner Violence: Not At Risk (09/09/2023)   Humiliation, Afraid, Rape, and Kick questionnaire    Fear of Current or Ex-Partner: No    Emotionally Abused: No    Physically Abused: No    Sexually Abused: No     Constitutional: Denies fever, malaise, fatigue, headache or abrupt weight changes.  HEENT: Pt reports nasal congestion. Denies eye pain, eye redness, ear pain, ringing in the ears, wax buildup, runny nose, bloody nose, or sore throat. Respiratory: Patient reports cough, shortness of breath.  Denies difficulty breathing.   Cardiovascular: Denies chest pain, chest tightness, palpitations or swelling in the hands or feet.  Neurological: Denies dizziness, difficulty with memory, difficulty with speech or problems with balance and coordination.   No other specific complaints in a complete review of systems (except as listed in HPI above).  Objective:    Physical Exam  BP 102/66 (BP Location: Left Arm, Patient Position: Sitting, Cuff Size: Normal)   Pulse 89   Temp (!) 95.1 F (35.1 C) (Temporal)   Wt 119 lb (54 kg)   SpO2 96%   BMI 20.43 kg/m   Wt Readings from Last 3 Encounters:  07/23/23 121 lb 6.4 oz (55.1 kg)  05/22/23 120 lb (54.4 kg)  04/20/23 119 lb (54 kg)    General: Appears her stated age, well developed, well nourished in NAD. HEENT: Head: normal shape and size; Eyes: sclera white, no icterus, conjunctiva pink, PERRLA and EOMs intact; Ears: Tm's gray and intact, normal light reflex; Nose: mucosa pink and moist, septum midline;   Neck: No adenopathy noted. Cardiovascular: Normal rate and rhythm. S1,S2 noted.  No murmur, rubs or gallops noted.  Pulmonary/Chest: Normal effort and diminished breath sounds. No respiratory distress. No wheezes, rales or ronchi noted.  Neurological: Alert and oriented.   BMET    Component Value Date/Time   NA 138 03/01/2023 1241   NA 140 11/17/2021 1239   K 3.7 03/01/2023 1241   CL 102 03/01/2023 1241   CO2 28 03/01/2023 1241   GLUCOSE 118 (H) 03/01/2023 1241   BUN 12 03/01/2023 1241   BUN 12 11/17/2021 1239   CREATININE 0.61 03/01/2023 1241   CREATININE 0.58 01/20/2021 0801   CALCIUM 9.2 03/01/2023 1241   GFRNONAA >60 03/01/2023 1241   GFRNONAA 97 01/20/2021 0801   GFRAA 112 01/20/2021 0801    Lipid Panel     Component Value Date/Time   CHOL 165 12/22/2021 0952   TRIG 64 12/22/2021 0952   HDL 63 12/22/2021 0952   CHOLHDL 2.6 12/22/2021 0952   CHOLHDL 3.1 01/20/2021 0801   LDLCALC 89 12/22/2021 0952   LDLCALC 111 (H) 01/20/2021 0801    CBC    Component Value Date/Time   WBC 7.5 03/01/2023 1241   RBC 4.31 03/01/2023 1241   HGB 13.8 03/01/2023 1241   HGB 14.1 11/10/2018 1043   HCT 42.5 03/01/2023 1241   HCT 40.3 11/10/2018 1043   PLT 288 03/01/2023  1241   PLT 287 11/10/2018 1043   MCV 98.6 03/01/2023 1241   MCV 92 11/10/2018 1043   MCH 32.0 03/01/2023 1241    MCHC 32.5 03/01/2023 1241   RDW 12.5 03/01/2023 1241   RDW 11.8 (L) 11/10/2018 1043   LYMPHSABS 1.1 03/01/2023 1241   MONOABS 0.8 03/01/2023 1241   EOSABS 0.3 03/01/2023 1241   BASOSABS 0.1 03/01/2023 1241    Hgb A1C Lab Results  Component Value Date   HGBA1C 5.8 (H) 01/20/2021           Assessment & Plan:   Assessment and Plan    COPD exacerbation Persistent cough with yellow-green sputum and shortness of breath despite two rounds of antibiotics and steroids. Currently on Levaquin and Prednisone started yesterday. Also using Breztri, Albuterol, and nebulized Previ. -Continue current treatment regimen. -Await chest x-ray results.   Follow-up with pulmonologist scheduled in November.     Follow-up with your PCP as previously scheduled Nicki Reaper, NP

## 2023-09-14 NOTE — Patient Instructions (Signed)

## 2023-09-14 NOTE — Telephone Encounter (Signed)
Has appointment scheduled with Rene Kocher today.

## 2023-09-16 ENCOUNTER — Other Ambulatory Visit (HOSPITAL_COMMUNITY)
Admission: RE | Admit: 2023-09-16 | Discharge: 2023-09-16 | Disposition: A | Discharge status: Home / Self Care | Payer: No Typology Code available for payment source | Source: Ambulatory Visit | Attending: Obstetrics | Admitting: Obstetrics

## 2023-09-16 ENCOUNTER — Ambulatory Visit
Admission: RE | Admit: 2023-09-16 | Discharge: 2023-09-16 | Disposition: A | Discharge status: Home / Self Care | Payer: No Typology Code available for payment source | Source: Ambulatory Visit | Attending: Obstetrics | Admitting: Obstetrics

## 2023-09-16 ENCOUNTER — Ambulatory Visit (INDEPENDENT_AMBULATORY_CARE_PROVIDER_SITE_OTHER): Payer: No Typology Code available for payment source | Admitting: Obstetrics

## 2023-09-16 ENCOUNTER — Encounter: Payer: Self-pay | Admitting: Obstetrics

## 2023-09-16 VITALS — BP 120/70 | Ht 64.0 in | Wt 123.0 lb

## 2023-09-16 DIAGNOSIS — Z124 Encounter for screening for malignant neoplasm of cervix: Secondary | ICD-10-CM

## 2023-09-16 DIAGNOSIS — Z01419 Encounter for gynecological examination (general) (routine) without abnormal findings: Secondary | ICD-10-CM | POA: Insufficient documentation

## 2023-09-16 DIAGNOSIS — Z1231 Encounter for screening mammogram for malignant neoplasm of breast: Secondary | ICD-10-CM | POA: Diagnosis not present

## 2023-09-16 DIAGNOSIS — Z1151 Encounter for screening for human papillomavirus (HPV): Secondary | ICD-10-CM | POA: Diagnosis not present

## 2023-09-16 DIAGNOSIS — Z1211 Encounter for screening for malignant neoplasm of colon: Secondary | ICD-10-CM

## 2023-09-16 DIAGNOSIS — M8000XA Age-related osteoporosis with current pathological fracture, unspecified site, initial encounter for fracture: Secondary | ICD-10-CM

## 2023-09-16 NOTE — Progress Notes (Signed)
ANNUAL PREVENTATIVE CARE GYNECOLOGY  ENCOUNTER NOTE  SUBJECTIVE:       Brandi [name] is a 67 y.o. G68P1001 female here for a routine annual gynecologic exam. The patient is sexually active. The patient is not taking hormone replacement therapy. Patient denies post-menopausal vaginal bleeding. The patient wears seatbelts: yes. The patient participates in regular exercise: yes. Has the patient ever been transfused or tattooed?: no. The patient reports that there is not domestic violence in her life.  Current complaints: 1.  None today   Gynecologic History No LMP recorded. Patient is postmenopausal. Contraception: post menopausal status Last Pap: 11/10/18. Results were: normal Last mammogram: 12/18/21. Results were: normal Last Colonoscopy: Never done,She had a positive cologuard in 2019 & declined colonoscopy Last Dexa Scan: 01/04/2019; recently ordered on 09/09/23 by outside provider, needs to complete  Obstetric History OB History  Gravida Para Term Preterm AB Living  1 1 1     1   SAB IAB Ectopic Multiple Live Births          1    # Outcome Date GA Lbr Len/2nd Weight Sex Type Anes PTL Lv  1 Term 10/15/74 [redacted]w[redacted]d  8 lb (3.629 kg) F Vag-Spont   LIV    Past Medical History:  Diagnosis Date   COPD (chronic obstructive pulmonary disease) (HCC)    Hyperlipidemia     Family History  Problem Relation Age of Onset   Breast cancer Mother 60   Alzheimer's disease Mother     Past Surgical History:  Procedure Laterality Date   APPENDECTOMY     CHOLECYSTECTOMY     SHOULDER ARTHROSCOPY Left 07/02/2015   Procedure: ,left shoulder arthroscopy, decompression and debridement;  Surgeon: Christena Flake, MD;  Location: [facility];  Service: Orthopedics;  Laterality: Left;   SHOULDER CLOSED REDUCTION Left 04/09/2015   Procedure: CLOSED MANIPULATION SHOULDER;  Surgeon: Kennedy Bucker, MD;  Location: [facility];  Service: Orthopedics;  Laterality: Left;    Social History   Socioeconomic History    Marital status: Widowed    Spouse name: Not on file   Number of children: 1   Years of education: Not on file   Highest education level: Not on file  Occupational History   Occupation: Retired  Tobacco Use   Smoking status: Former    Current packs/day: 0.00    Average packs/day: 0.8 packs/day for 41.0 years (30.8 ttl pk-yrs)    Types: Cigarettes    Start date: 12/07/1974    Quit date: 12/08/2015    Years since quitting: 7.7   Smokeless tobacco: Never  Vaping Use   Vaping status: Never Used  Substance and Sexual Activity   Alcohol use: No   Drug use: No   Sexual activity: Yes    Birth control/protection: Post-menopausal  Other Topics Concern   Not on file  Social History Narrative   Not on file   Social Determinants of Health   Financial Resource Strain: Low Risk  (09/09/2023)   Overall Financial Resource Strain (CARDIA)    Difficulty of Paying Living Expenses: Not very hard  Food Insecurity: No Food Insecurity (09/09/2023)   Hunger Vital Sign    Worried About Running Out of Food in the Last Year: Never true    Ran Out of Food in the Last Year: Never true  Transportation Needs: No Transportation Needs (09/09/2023)   PRAPARE - Administrator, Civil Service (Medical): No    Lack of Transportation (Non-Medical): No  Physical Activity: Sufficiently  Active (09/09/2023)   Exercise Vital Sign    Days of Exercise per Week: 7 days    Minutes of Exercise per Session: 30 min  Stress: No Stress Concern Present (09/09/2023)   [employer] of Occupational Health - Occupational Stress Questionnaire    Feeling of Stress : Only a little  Social Connections: Moderately Isolated (09/09/2023)   Social Connection and Isolation Panel [NHANES]    Frequency of Communication with Friends and Family: More than three times a week    Frequency of Social Gatherings with Friends and Family: More than three times a week    Attends Religious Services: More than 4 times per year    Active  Member of [employer] or Organizations: No    Attends Banker Meetings: Never    Marital Status: Widowed  Intimate Partner Violence: Not At Risk (09/09/2023)   Humiliation, Afraid, Rape, and Kick questionnaire    Fear of Current or Ex-Partner: No    Emotionally Abused: No    Physically Abused: No    Sexually Abused: No    Current Outpatient Medications on File Prior to Visit  Medication Sig Dispense Refill   albuterol (PROVENTIL) (2.5 MG/3ML) 0.083% nebulizer solution USE 1 VIAL VIA NEBULIZER EVERY 6 HOURS AS NEEDED FOR WHEEZING OR SHORTNESS OF BREATH 75 mL 12   albuterol (VENTOLIN HFA) 108 (90 Base) MCG/ACT inhaler INHALE 2 PUFFS INTO THE LUNGS EVERY 4 HOURS AS NEEDED FOR WHEEZING OR SHORTNESS OF BREATH 8.5 g 2   aspirin EC 81 MG tablet Take 1 tablet (81 mg total) by mouth daily. 90 tablet 3   Budeson-Glycopyrrol-Formoterol (BREZTRI AEROSPHERE) 160-9-4.8 MCG/ACT AERO Inhale 2 puffs into the lungs in the morning and at bedtime. 11.8 g 0   busPIRone (BUSPAR) 5 MG tablet TAKE 1 TABLET BY MOUTH TWICE DAILY AS NEEDED FOR ANXIETY 180 tablet 1   Calcium Carbonate-Vit D-Min (CALCIUM 1200) 1200-1000 MG-UNIT CHEW Chew 1,200 mg by mouth daily. 30 each 11   Cholecalciferol (VITAMIN D3) 125 MCG (5000 UT) TABS Take by mouth. Vitamin D3 5,000 IU daily for 12 weeks then reduce to OTC Vitamin D3 2,000 IU daily for maintenance     ezetimibe (ZETIA) 10 MG tablet Take 1 tablet (10 mg total) by mouth daily. 90 tablet 3   famotidine (PEPCID) 20 MG tablet Take 1 tablet (20 mg total) by mouth 2 (two) times daily as needed. 180 tablet 1   feeding supplement (ENSURE ENLIVE / ENSURE PLUS) LIQD Take 237 mLs by mouth 2 (two) times daily between meals. (Patient taking differently: Take 237 mLs by mouth as needed.) 237 mL 12   levofloxacin (LEVAQUIN) 750 MG tablet Take 750 mg by mouth daily.     mometasone (ELOCON) 0.1 % lotion Apply topically.     montelukast (SINGULAIR) 10 MG tablet TAKE 1 TABLET BY MOUTH AT  BEDTIME 90 tablet 0   Multiple Vitamin (MULTIVITAMIN WITH MINERALS) TABS tablet Take 1 tablet by mouth daily.     OHTUVAYRE 3 MG/2.5ML SUSP Inhale 3 mg into the lungs 2 (two) times daily. 150 mL 11   omeprazole (PRILOSEC) 20 MG capsule Take 1 capsule (20 mg total) by mouth 2 (two) times daily before a meal. 180 capsule 3   ondansetron (ZOFRAN-ODT) 4 MG disintegrating tablet Take 1 tablet (4 mg total) by mouth every 8 (eight) hours as needed for nausea or vomiting. 30 tablet 0   OXYGEN Inhale 3 L into the lungs at bedtime as needed.  predniSONE (STERAPRED UNI-PAK 21 TAB) 10 MG (21) TBPK tablet Take by mouth daily. Take 6 tabs by mouth daily for 1, then 5 tabs for 1 day, then 4 tabs for 1 day, then 3 tabs for 1 day, then 2 tabs for 1 day, then 1 tab for 1 day. 21 tablet 0   rosuvastatin (CRESTOR) 10 MG tablet Take 1 tablet (10 mg total) by mouth daily. 90 tablet 3   sodium chloride (OCEAN) 0.65 % SOLN nasal spray Place 1 spray into both nostrils as needed for congestion.     Specialty Vitamins Products (HAIR NOURISHING SUPPLEMENT PO) Take 1 tablet by mouth daily.     tiZANidine (ZANAFLEX) 2 MG tablet Take 1 tablet (2 mg total) by mouth every 8 (eight) hours as needed for muscle spasms. 60 tablet 2   triamcinolone cream (KENALOG) 0.5 % Apply 1 Application topically 2 (two) times daily as needed (allergic reaction on skin swelling). To affected areas, for up to 2 weeks. 30 g 2   cetirizine (ZYRTEC ALLERGY) 10 MG tablet Take 1 tablet (10 mg total) by mouth daily for 7 days. 7 tablet 0   No current facility-administered medications on file prior to visit.    No Known Allergies  Review of Systems ROS Review of Systems - General ROS: negative for - chills, fatigue, fever, hot flashes, night sweats, weight gain or weight loss Psychological ROS: negative for - anxiety, decreased libido, depression, mood swings, physical abuse or sexual abuse Ophthalmic ROS: negative for - blurry vision, eye pain or  loss of vision ENT ROS: negative for - headaches, hearing change, visual changes or vocal changes Allergy and Immunology ROS: negative for - hives, itchy/watery eyes or seasonal allergies Hematological and Lymphatic ROS: negative for - bleeding problems, bruising, swollen lymph nodes or weight loss Endocrine ROS: negative for - galactorrhea, hair pattern changes, hot flashes, malaise/lethargy, mood swings, palpitations, polydipsia/polyuria, skin changes, temperature intolerance or unexpected weight changes Breast ROS: negative for - new or changing breast lumps or nipple discharge Respiratory ROS: negative for - cough or shortness of breath beyond baseline of COPD Cardiovascular ROS: negative for - chest pain, irregular heartbeat, palpitations or shortness of breath Gastrointestinal ROS: no abdominal pain, change in bowel habits, or black or bloody stools Genito-Urinary ROS: no dysuria, trouble voiding, or hematuria Musculoskeletal ROS: negative for - joint pain or joint stiffness Neurological ROS: negative for - bowel and bladder control changes Dermatological ROS: negative for rash and skin lesion changes   OBJECTIVE:   BP 120/70   Ht 5\' 4"  (1.626 m)   Wt 123 lb (55.8 kg)   BMI 21.11 kg/m   CONSTITUTIONAL: Well-developed, well-nourished female in no acute distress.  PSYCHIATRIC: Normal mood and affect. Normal behavior. Normal judgment and thought content. NEUROLGIC: Alert and oriented to person, place, and time. Normal muscle tone coordination. No cranial nerve deficit noted. HENT:  Normocephalic, atraumatic, External right and left ear normal. Oropharynx is clear and moist EYES: Conjunctivae and EOM are normal. Pupils are equal, round, and reactive to light. No scleral icterus.  NECK: Normal range of motion, supple, no masses.  Normal thyroid.  SKIN: Skin is warm and dry. No rash noted. Not diaphoretic. No erythema. No pallor. CARDIOVASCULAR: Normal heart rate noted, regular rhythm,  no murmur. RESPIRATORY: Clear to auscultation bilaterally. Effort and breath sounds normal, no problems with respiration noted. BREASTS: Symmetric in size. No masses, skin changes, nipple drainage, or lymphadenopathy. ABDOMEN: Soft, normal bowel sounds, no distention noted.  No tenderness, rebound or guarding.  BLADDER: Normal PELVIC:  Bladder no bladder distension noted  Urethra: normal appearing urethra with no masses, tenderness or lesions  Vulva: normal appearing vulva with no masses, tenderness or lesions  Vagina: normal appearing vagina with normal color and discharge, no lesions and atrophic  Cervix: normal appearing cervix without discharge or lesions  Uterus: uterus is normal size, shape, consistency and nontender  Adnexa: normal adnexa in size, nontender and no masses  RV: External Exam NormaI, No Rectal Masses, and Normal Sphincter tone  MUSCULOSKELETAL: Normal range of motion. No tenderness.  No cyanosis, clubbing, or edema.  2+ distal pulses. LYMPHATIC: No Axillary, Supraclavicular, or Inguinal Adenopathy.   Labs: Lab Results  Component Value Date   WBC 7.5 03/01/2023   HGB 13.8 03/01/2023   HCT 42.5 03/01/2023   MCV 98.6 03/01/2023   PLT 288 03/01/2023    Lab Results  Component Value Date   CREATININE 0.61 03/01/2023   BUN 12 03/01/2023   NA 138 03/01/2023   K 3.7 03/01/2023   CL 102 03/01/2023   CO2 28 03/01/2023    Lab Results  Component Value Date   ALT 15 01/20/2021   AST 17 01/20/2021   ALKPHOS 39 12/22/2020   BILITOT 0.4 01/20/2021    Lab Results  Component Value Date   CHOL 165 12/22/2021   HDL 63 12/22/2021   LDLCALC 89 12/22/2021   TRIG 64 12/22/2021   CHOLHDL 2.6 12/22/2021    Lab Results  Component Value Date   TSH 1.32 03/26/2023    Lab Results  Component Value Date   HGBA1C 5.8 (H) 01/20/2021     ASSESSMENT:   1. Well woman exam   2. Osteoporosis with current pathological fracture, unspecified osteoporosis type, initial  encounter   3. Cervical cancer screening   4. Colon cancer screening   5. Encounter for screening mammogram for malignant neoplasm of breast      PLAN:   Brandi [name] is a 67 y.o. G30P1001 female here today for her annual exam, doing well.  -Screenings:  Pap: done with cotesting today due to not having 2 NILMs in past 75yrs; if NILM/HPV-, can age out.  Mammogram: ordered Colon: refuses colonoscopy; will order ColoGuard again, and if positive, pt states will "consider" colonoscopy Labs: per PCP -Contraception: post-menopausal -Vaccines: declines all today; recommend Influenza, COVID, PCV20, Shingrix, and Td -Healthy lifestyle modifications discussed: multivitamin, diet, exercise, sunscreen, tobacco and alcohol use. Emphasized importance of regular physical activity.  -Calcium and Vit D recommendation reviewed.  -All questions answered to patient's satisfaction.  -RTC 1 yr for annual, sooner prn.   Osteoporosis:  -Pt states was taking medication for this, but was taken off by pulmonology, with intent to re-start in the coming several weeks. Emphasized importance of following up on this for her bone health. Pt also has a pending DEXA scan order from 09/09/23 placed by outside office, reminder given today and encouraged to complete.    Julieanne Manson, DO [facility]

## 2023-09-21 ENCOUNTER — Telehealth: Payer: Self-pay

## 2023-09-21 ENCOUNTER — Other Ambulatory Visit: Payer: No Typology Code available for payment source

## 2023-09-21 LAB — CYTOLOGY - PAP
Comment: NEGATIVE
Diagnosis: NEGATIVE
High risk HPV: NEGATIVE

## 2023-09-21 NOTE — Telephone Encounter (Signed)
Pt called back after seeing a missed call from the office.  Pt aware you will call her back as soon as you are available to do so.    cb  [phone]

## 2023-09-21 NOTE — Patient Outreach (Signed)
Care Management   Follow Up Note   09/21/2023 Name: Brandyce Salmi MRN: [id number] DOB: 1956-08-12   Referred by: [name], DO Reason for referral : Care Management (RNCM: Follow up for Chronic Disease Management and Care Coordination Needs- attempt)   An unsuccessful telephone outreach was attempted today. The patient was referred to the case management team for assistance with care management and care coordination.   Follow Up Plan: A HIPPA compliant phone message was left for the patient providing contact information and requesting a return call.   Alto Denver RN, MSN, CCM RN Care Manager  [facility]  Ambulatory Care Management  Direct Number: [phone]

## 2023-09-22 ENCOUNTER — Other Ambulatory Visit: Payer: Self-pay

## 2023-09-22 ENCOUNTER — Telehealth: Payer: Self-pay

## 2023-09-22 NOTE — Patient Outreach (Signed)
Care Management   Visit Note  09/22/2023 Name: Brandi [name] MRN: [id number] DOB: 03/01/1956  Subjective: Brandi [name] is a 67 y.o. year old female who is a primary care patient of [name], DO. The Care Management team was consulted for assistance.      Engaged with patient spoke with patient by telephone.    Goals Addressed             This Visit's Progress    RNCM Care Management  Expected Outcome:  Monitor, Self-Manage and Reduce Symptoms of  COPD Emphysema       Current Barriers:  Knowledge Deficits related to the need for regular follow up for COPD and other chronic conditions Care Coordination needs related to expressed needs of the patient with COPD and the need for collaboration with the providers in a patient with COPD Chronic Disease Management support and education needs related to effective management of COPD  Planned Interventions: Provided patient with basic written and verbal COPD education on self care/management/and exacerbation prevention. The patient has been sick the whole month of September and it has spilled over into the month of October. She has been on 3 different ABX and she is taking mucinex. She states that she is feeling better some but not a lot. She has a follow up in November with the pulmonary provider but encouraged her to call and get a sooner appointment. Offered an appointment with pcp but she wants to see what the pulmonary provider says. Will continue to monitor.  Advised patient to track and manage COPD triggers. The patient states she is doing better with her breathing since seeing her pulmonary provider. She states the inhalers she is using now are very good for her. She thinks the Trelegy is causing her to be more hoarse and ask about going back on the [place]. She says the new nebulizer that she is using is not really helping and it cost 3000.00 the pulmonary provider helped her to get this but she is going to ask about getting  something different to see if that will help. Education provided about pharm D support. She will let the RNCM know if she would like to talk to the pharm D.  Provided written and verbal instructions on pursed lip breathing and utilized returned demonstration as teach back Provided instruction about proper use of medications used for management of COPD including inhalers. Has just switched from [facility] to Trelegy and has her inhalers. Uses oxygen at night and when needed. States that the Trelegy has worked well for her but it is more of a powder and thinks it is causing hoarseness. Wants to talk to the provider about changing back to [facility]. Denies any issues with getting her medications.  Advised patient to self assesses COPD action plan zone and make appointment with provider if in the yellow zone for 48 hours without improvement. Education about being safe when around others, and being mindful of weather changes.  Advised patient to engage in light exercise as tolerated 3-5 days a week to aid in the the management of COPD Provided education about and advised patient to utilize infection prevention strategies to reduce risk of respiratory infection. Education on taking all medications as directed.  Discussed the importance of adequate rest and management of fatigue with COPD. Education on pacing activity and monitoring for factors that exacerbate her condition.  Screening for signs and symptoms of depression related to chronic disease state  Assessed social determinant of health barriers  Symptom Management: Take medications as prescribed   Attend all scheduled provider appointments Call provider office for new concerns or questions  call the Suicide and Crisis Lifeline: 988 call the [place] National Suicide Prevention Lifeline: 986-310-5049 or TTY: (952)013-9680 TTY 760-273-0113) to talk to a trained counselor call 1-800-273-TALK (toll free, 24 hour hotline) if experiencing a Mental Health or  Behavioral Health Crisis  avoid second hand smoke eliminate smoking in my home identify and avoid work-related triggers identify and remove indoor air pollutants limit outdoor activity during cold weather listen for public air quality announcements every day do breathing exercises every day develop a rescue plan eliminate symptom triggers at home follow rescue plan if symptoms flare-up  Follow Up Plan: Telephone follow up appointment with care management team member scheduled for: 11-17-2023 at 1145 am       RNCM Care Management Expected outcome:  Monitor, Self-Manage and Reduce Symptoms of HLD       Current Barriers:  Knowledge Deficits related to the importance of regular visits to see the provider for effective management of HLD and other chronic conditions Chronic Disease Management support and education needs related to effective management of HLD Lab Results  Component Value Date   CHOL 165 12/22/2021   HDL 63 12/22/2021   LDLCALC 89 12/22/2021   TRIG 64 12/22/2021   CHOLHDL 2.6 12/22/2021    Planned Interventions: Provider established cholesterol goals reviewed. Needs new labwork done. Education and support given; Counseled on importance of regular laboratory monitoring as prescribed. The patients labs are stable. Education on needing to get updated lipid panel. The patient has seen the provider recently but not for labs. Will continue to monitor for changes. ; Provided HLD educational materials; Reviewed role and benefits of statin for ASCVD risk reduction. Takes Zetia 10 mg QD and rosuvastatin 10 mg Q. Denies any issues with medication compliance; Discussed strategies to manage statin-induced myalgias; Reviewed importance of limiting foods high in cholesterol; Reviewed exercise goals and target of 150 minutes per week; Screening for signs and symptoms of depression related to chronic disease state;  Assessed social determinant of health barriers;  Encouraged the patient  to get a follow up with the pcp for blood work to check levels. Will continue to monitor.   Symptom Management: Take medications as prescribed   Attend all scheduled provider appointments Call provider office for new concerns or questions  call the Suicide and Crisis Lifeline: 988 call the [place] National Suicide Prevention Lifeline: 862 245 7007 or TTY: (580)788-1951 TTY (405)488-5674) to talk to a trained counselor call 1-800-273-TALK (toll free, 24 hour hotline) if experiencing a Mental Health or Behavioral Health Crisis  - call for medicine refill 2 or 3 days before it runs out - take all medications exactly as prescribed - call doctor with any symptoms you believe are related to your medicine - call doctor when you experience any new symptoms - go to all doctor appointments as scheduled      COMPLETED: RNCM Care Management Expected Outcome:  Monitor, Self-Manage and Reduce Symptoms of: Pre Diabetes       Current Barriers: closing this goal. The patients blood sugars are stable Knowledge Deficits related to the importance of regular follow up for Chronic Disease Management and Care Coordination Needs and effective management of pre- DM Care Coordination needs related to food resources and educational needs  in a patient with DM Chronic Disease Management support and education needs related to effective management of DM Lab Results  Component Value Date  HGBA1C 5.8 (H) 01/20/2021     Planned Interventions: Provided education to patient about basic DM disease process. Denies any changes in her health and well being. Overdue for labs to check the status of Pre-Dm, encourage follow up with the pcp.; Reviewed medications with patient and discussed importance of medication adherence. The patient is compliant with medications. Does not want to take prednisone at this causes her issues;        Reviewed prescribed diet with patient heart healthy/ADA diet. The patient states sometimes she has  issues with her blood sugars getting low; Counseled on importance of regular laboratory monitoring as prescribed. Review of the need to get updated A1C;        Discussed plans with patient for ongoing care management follow up and provided patient with direct contact information for care management team;      Provided patient with written educational materials related to hypo and hyperglycemia and importance of correct treatment;       Reviewed scheduled/upcoming provider appointments including:Will see pulmonary provider next week, needs follow up with the pcp. call provider for findings outside established parameters;       Referral made to community resources care guide team for assistance with food resources due to financial strain;      Review of patient status, including review of consultants reports, relevant laboratory and other test results, and medications completed;       Advised patient to discuss changes in her DM, questions, concerns, and the need for new labwork with provider;      Screening for signs and symptoms of depression related to chronic disease state;        Assessed social determinant of health barriers;        The patient currently has shingles. Her shingles are better. She states that she does still have some flare ups in her symptoms. Discussed how it takes time for the shingles to get better and improve. The patient verbalized understanding.   Symptom Management: Take medications as prescribed   Attend all scheduled provider appointments Call provider office for new concerns or questions  call the Suicide and Crisis Lifeline: 988 call the [place] National Suicide Prevention Lifeline: 3076300042 or TTY: 708-872-0674 TTY 747-174-5516) to talk to a trained counselor call 1-800-273-TALK (toll free, 24 hour hotline) if experiencing a Mental Health or Behavioral Health Crisis  check feet daily for cuts, sores or redness trim toenails straight across wash and dry feet  carefully every day wear comfortable, cotton socks wear comfortable, well-fitting shoes  Follow Up Plan: Telephone follow up appointment with care management team member scheduled for: 09-21-2023 at 1145 am           Consent to Services:  Patient was given information about care management services, agreed to services, and gave verbal consent to participate.   Plan: Telephone follow up appointment with care management team member scheduled for: 11-17-2023 at 1145am  Alto Denver RN, MSN, CCM RN Care Manager  [facility]  Ambulatory Care Management  Direct Number: [phone]

## 2023-09-22 NOTE — Telephone Encounter (Signed)
Patient called back, returning Alto Denver, RN, call. Please contact patient back @ #[phone].

## 2023-09-22 NOTE — Patient Outreach (Signed)
Care Management   Follow Up Note   09/22/2023 Name: Brandi [name] MRN: [id number] DOB: 1956-01-16   Referred by: [name], DO Reason for referral : Care Management (RNCM: Follow up for Chronic Disease Management and Care Coordination Needs/)   The patient returned call and the call was completed. See new encounter.   Follow Up Plan: Telephone follow up appointment with care management team member scheduled for: 11-17-2023 at 1145 am  Alto Denver RN, MSN, CCM RN Care Manager  [facility]  Ambulatory Care Management  Direct Number: [phone]

## 2023-09-22 NOTE — Patient Instructions (Signed)
Visit Information  Thank you for taking time to visit with me today. Please don't hesitate to contact me if I can be of assistance to you before our next scheduled telephone appointment.  Following are the goals we discussed today:   Goals Addressed             This Visit's Progress    RNCM Care Management  Expected Outcome:  Monitor, Self-Manage and Reduce Symptoms of  COPD Emphysema       Current Barriers:  Knowledge Deficits related to the need for regular follow up for COPD and other chronic conditions Care Coordination needs related to expressed needs of the patient with COPD and the need for collaboration with the providers in a patient with COPD Chronic Disease Management support and education needs related to effective management of COPD  Planned Interventions: Provided patient with basic written and verbal COPD education on self care/management/and exacerbation prevention. The patient has been sick the whole month of September and it has spilled over into the month of October. She has been on 3 different ABX and she is taking mucinex. She states that she is feeling better some but not a lot. She has a follow up in November with the pulmonary provider but encouraged her to call and get a sooner appointment. Offered an appointment with pcp but she wants to see what the pulmonary provider says. Will continue to monitor.  Advised patient to track and manage COPD triggers. The patient states she is doing better with her breathing since seeing her pulmonary provider. She states the inhalers she is using now are very good for her. She thinks the Trelegy is causing her to be more hoarse and ask about going back on the [place]. She says the new nebulizer that she is using is not really helping and it cost 3000.00 the pulmonary provider helped her to get this but she is going to ask about getting something different to see if that will help. Education provided about pharm D support. She will let  the RNCM know if she would like to talk to the pharm D.  Provided written and verbal instructions on pursed lip breathing and utilized returned demonstration as teach back Provided instruction about proper use of medications used for management of COPD including inhalers. Has just switched from [facility] to Trelegy and has her inhalers. Uses oxygen at night and when needed. States that the Trelegy has worked well for her but it is more of a powder and thinks it is causing hoarseness. Wants to talk to the provider about changing back to [facility]. Denies any issues with getting her medications.  Advised patient to self assesses COPD action plan zone and make appointment with provider if in the yellow zone for 48 hours without improvement. Education about being safe when around others, and being mindful of weather changes.  Advised patient to engage in light exercise as tolerated 3-5 days a week to aid in the the management of COPD Provided education about and advised patient to utilize infection prevention strategies to reduce risk of respiratory infection. Education on taking all medications as directed.  Discussed the importance of adequate rest and management of fatigue with COPD. Education on pacing activity and monitoring for factors that exacerbate her condition.  Screening for signs and symptoms of depression related to chronic disease state  Assessed social determinant of health barriers  Symptom Management: Take medications as prescribed   Attend all scheduled provider appointments Call provider office for new  concerns or questions  call the Suicide and Crisis Lifeline: 988 call the [place] National Suicide Prevention Lifeline: (660)855-2803 or TTY: (615)453-2236 TTY 508-530-6388) to talk to a trained counselor call 1-800-273-TALK (toll free, 24 hour hotline) if experiencing a Mental Health or Behavioral Health Crisis  avoid second hand smoke eliminate smoking in my home identify and avoid  work-related triggers identify and remove indoor air pollutants limit outdoor activity during cold weather listen for public air quality announcements every day do breathing exercises every day develop a rescue plan eliminate symptom triggers at home follow rescue plan if symptoms flare-up  Follow Up Plan: Telephone follow up appointment with care management team member scheduled for: 11-17-2023 at 1145 am       RNCM Care Management Expected outcome:  Monitor, Self-Manage and Reduce Symptoms of HLD       Current Barriers:  Knowledge Deficits related to the importance of regular visits to see the provider for effective management of HLD and other chronic conditions Chronic Disease Management support and education needs related to effective management of HLD Lab Results  Component Value Date   CHOL 165 12/22/2021   HDL 63 12/22/2021   LDLCALC 89 12/22/2021   TRIG 64 12/22/2021   CHOLHDL 2.6 12/22/2021    Planned Interventions: Provider established cholesterol goals reviewed. Needs new labwork done. Education and support given; Counseled on importance of regular laboratory monitoring as prescribed. The patients labs are stable. Education on needing to get updated lipid panel. The patient has seen the provider recently but not for labs. Will continue to monitor for changes. ; Provided HLD educational materials; Reviewed role and benefits of statin for ASCVD risk reduction. Takes Zetia 10 mg QD and rosuvastatin 10 mg Q. Denies any issues with medication compliance; Discussed strategies to manage statin-induced myalgias; Reviewed importance of limiting foods high in cholesterol; Reviewed exercise goals and target of 150 minutes per week; Screening for signs and symptoms of depression related to chronic disease state;  Assessed social determinant of health barriers;  Encouraged the patient to get a follow up with the pcp for blood work to check levels. Will continue to monitor.   Symptom  Management: Take medications as prescribed   Attend all scheduled provider appointments Call provider office for new concerns or questions  call the Suicide and Crisis Lifeline: 988 call the [place] National Suicide Prevention Lifeline: 873-175-4580 or TTY: 502-242-9863 TTY (215) 513-4877) to talk to a trained counselor call 1-800-273-TALK (toll free, 24 hour hotline) if experiencing a Mental Health or Behavioral Health Crisis  - call for medicine refill 2 or 3 days before it runs out - take all medications exactly as prescribed - call doctor with any symptoms you believe are related to your medicine - call doctor when you experience any new symptoms - go to all doctor appointments as scheduled      COMPLETED: RNCM Care Management Expected Outcome:  Monitor, Self-Manage and Reduce Symptoms of: Pre Diabetes       Current Barriers: closing this goal. The patients blood sugars are stable Knowledge Deficits related to the importance of regular follow up for Chronic Disease Management and Care Coordination Needs and effective management of pre- DM Care Coordination needs related to food resources and educational needs  in a patient with DM Chronic Disease Management support and education needs related to effective management of DM Lab Results  Component Value Date   HGBA1C 5.8 (H) 01/20/2021     Planned Interventions: Provided education to patient about basic  DM disease process. Denies any changes in her health and well being. Overdue for labs to check the status of Pre-Dm, encourage follow up with the pcp.; Reviewed medications with patient and discussed importance of medication adherence. The patient is compliant with medications. Does not want to take prednisone at this causes her issues;        Reviewed prescribed diet with patient heart healthy/ADA diet. The patient states sometimes she has issues with her blood sugars getting low; Counseled on importance of regular laboratory monitoring as  prescribed. Review of the need to get updated A1C;        Discussed plans with patient for ongoing care management follow up and provided patient with direct contact information for care management team;      Provided patient with written educational materials related to hypo and hyperglycemia and importance of correct treatment;       Reviewed scheduled/upcoming provider appointments including:Will see pulmonary provider next week, needs follow up with the pcp. call provider for findings outside established parameters;       Referral made to community resources care guide team for assistance with food resources due to financial strain;      Review of patient status, including review of consultants reports, relevant laboratory and other test results, and medications completed;       Advised patient to discuss changes in her DM, questions, concerns, and the need for new labwork with provider;      Screening for signs and symptoms of depression related to chronic disease state;        Assessed social determinant of health barriers;        The patient currently has shingles. Her shingles are better. She states that she does still have some flare ups in her symptoms. Discussed how it takes time for the shingles to get better and improve. The patient verbalized understanding.   Symptom Management: Take medications as prescribed   Attend all scheduled provider appointments Call provider office for new concerns or questions  call the Suicide and Crisis Lifeline: 988 call the [place] National Suicide Prevention Lifeline: 2266864551 or TTY: 431 088 1003 TTY 684-629-6061) to talk to a trained counselor call 1-800-273-TALK (toll free, 24 hour hotline) if experiencing a Mental Health or Behavioral Health Crisis  check feet daily for cuts, sores or redness trim toenails straight across wash and dry feet carefully every day wear comfortable, cotton socks wear comfortable, well-fitting shoes  Follow Up  Plan: Telephone follow up appointment with care management team member scheduled for: 09-21-2023 at 1145 am           Our next appointment is by telephone on 11-17-2023 at 1145 am   Please call the care guide team at [phone] if you need to cancel or reschedule your appointment.   If you are experiencing a Mental Health or Behavioral Health Crisis or need someone to talk to, please call the Suicide and Crisis Lifeline: 988 call the [place] National Suicide Prevention Lifeline: 236-361-4944 or TTY: 250-190-8863 TTY (331)530-8996) to talk to a trained counselor call 1-800-273-TALK (toll free, 24 hour hotline)   Patient verbalizes understanding of instructions and care plan provided today and agrees to view in MyChart. Active MyChart status and patient understanding of how to access instructions and care plan via MyChart confirmed with patient.       Alto Denver RN, MSN, CCM RN Care Manager  [facility]  Ambulatory Care Management  Direct Number: [phone]

## 2023-09-23 DIAGNOSIS — J441 Chronic obstructive pulmonary disease with (acute) exacerbation: Secondary | ICD-10-CM | POA: Diagnosis not present

## 2023-09-23 DIAGNOSIS — J449 Chronic obstructive pulmonary disease, unspecified: Secondary | ICD-10-CM | POA: Diagnosis not present

## 2023-09-23 DIAGNOSIS — Z008 Encounter for other general examination: Secondary | ICD-10-CM | POA: Diagnosis not present

## 2023-09-24 ENCOUNTER — Encounter: Payer: Self-pay | Admitting: Pulmonary Disease

## 2023-09-24 NOTE — Telephone Encounter (Signed)
Received fax from [facility]. Sharyn Blitz is approved through Part B (medical benefit) and denied through Part D (Rx benefit)  Chesley Mires, PharmD, MPH, BCPS, CPP Clinical Pharmacist (Rheumatology and Pulmonology)

## 2023-10-11 ENCOUNTER — Other Ambulatory Visit: Payer: Self-pay | Admitting: Family Medicine

## 2023-10-11 DIAGNOSIS — J441 Chronic obstructive pulmonary disease with (acute) exacerbation: Secondary | ICD-10-CM | POA: Diagnosis not present

## 2023-10-11 DIAGNOSIS — Z008 Encounter for other general examination: Secondary | ICD-10-CM | POA: Diagnosis not present

## 2023-10-11 DIAGNOSIS — F4323 Adjustment disorder with mixed anxiety and depressed mood: Secondary | ICD-10-CM

## 2023-10-12 NOTE — Telephone Encounter (Signed)
Requested Prescriptions  Pending Prescriptions Disp Refills   busPIRone (BUSPAR) 5 MG tablet [Pharmacy Med Name: BUSPIRONE HCL 5 MG TAB] 180 tablet 1    Sig: TAKE 1 TABLET BY MOUTH TWICE DAILY AS NEEDED FOR ANXIETY     Psychiatry: Anxiolytics/Hypnotics - Non-controlled Passed - 10/11/2023  2:07 PM      Passed - Valid encounter within last 12 months    Recent Outpatient Visits           4 weeks ago Chronic obstructive pulmonary disease, unspecified COPD type [facility])   [place] [facility] [place], Salvadore Oxford, NP   5 months ago Allergy to ant bite   [place] [facility] [place], [name], DO   6 months ago Post herpetic neuralgia   [place] [facility] [name], DO   7 months ago Herpes zoster without complication    [facility] [place], [name], DO   7 months ago Community acquired pneumonia of left lung, unspecified part of lung   [facility] [place], Salvadore Oxford, [place]

## 2023-10-24 DIAGNOSIS — J449 Chronic obstructive pulmonary disease, unspecified: Secondary | ICD-10-CM | POA: Diagnosis not present

## 2023-10-27 ENCOUNTER — Encounter: Payer: Self-pay | Admitting: Pulmonary Disease

## 2023-10-27 ENCOUNTER — Ambulatory Visit: Payer: No Typology Code available for payment source | Admitting: Pulmonary Disease

## 2023-10-27 VITALS — BP 94/64 | HR 87 | Temp 97.6°F | Ht 64.0 in | Wt 126.2 lb

## 2023-10-27 DIAGNOSIS — J449 Chronic obstructive pulmonary disease, unspecified: Secondary | ICD-10-CM

## 2023-10-27 DIAGNOSIS — R0602 Shortness of breath: Secondary | ICD-10-CM

## 2023-10-27 DIAGNOSIS — J439 Emphysema, unspecified: Secondary | ICD-10-CM

## 2023-10-27 DIAGNOSIS — Z87891 Personal history of nicotine dependence: Secondary | ICD-10-CM

## 2023-10-27 MED ORDER — BREZTRI AEROSPHERE 160-9-4.8 MCG/ACT IN AERO: 2.0000 | INHALATION_SPRAY | Freq: Two times a day (BID) | RESPIRATORY_TRACT | 3 refills | Status: DC

## 2023-10-27 NOTE — Progress Notes (Addendum)
Subjective:    Patient ID: Brandi [name], female    DOB: November 05, 1956, 67 y.o.   MRN: [id number]  Patient Care Team: [name], DO as PCP - General (Family Medicine) [name], MD as Consulting Physician (Pulmonary Disease) Marlowe Sax, RN as Case Manager (General Practice)  Chief Complaint  Patient presents with   Follow-up    Occasional shortness of breath and wheezing on exertion.    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  Last evaluated here on 23 July 2023.  HPI Discussed the use of AI scribe software for clinical note transcription with the patient, who gave verbal consent to proceed.  History of Present Illness   The patient, with a history of chronic obstructive pulmonary disease (COPD), started a new nebulizer treatment several weeks ago. She reports no significant improvement in symptoms since starting the treatment. The patient has not participated in pulmonary rehabilitation, but is currently involved in a COPD management program, the details of which are still being introduced.  The patient recently completed a course of antibiotics, marking the fourth such course in September. She expresses a preference for a sulfa-based antibiotic, which she believes has been more effective in the past at managing her sinus and congestion symptoms.  The patient is also on Breztri, which she finds easier to use and inhale compared to Trelegy. She takes Singulair nightly for allergies, which she reports has helped manage her sinus symptoms.  The patient has not received a flu shot and expresses hesitancy due to a family member's adverse reaction to the vaccine in the past. She also expresses concern about the RSV vaccine.     DATA 04/09/2021 LDCT: Moderate to severe paraseptal and centrilobular emphysema.  Left upper lobe nodule measuring 2  mm (decreased from prior), compression fractures T7 and T9 noted previously and unchanged.  Coronary artery calcifications. 07/04/2021 alpha-1 antitrypsin: Phenotype MM, level 140 mg/dL (normal) 40/09/2724 overnight oximetry: Baseline O2 sat 90% during sleep with desaturations as low as 83% during the night desaturation events lasted for over an hour. 08/14/2021 PFTs: FEV1 1.13 L or 46% predicted, FVC 2.42 L or 76% predicted, FEV1/FVC 47%, there is a mild response to bronchodilator with a 10% net change.  There is hyperinflation with TLC at 128% and air trapping with RV at 188%.  Diffusion capacity is moderately to severely reduced by Kco.  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 08/14/2021 echocardiogram: LVEF 60 to 65%, grade 1 DD 12/04/2021 chest x-ray: Emphysema with no evidence of acute cardiopulmonary disease 12/24/2021 overnight oximetry: Baseline O2 sat 90% desaturations as low as 83%.   12/26/2021 nuclear medicine stress test: No significant ischemia, normal wall motion, EF estimated at 77%, coronary calcification in the LAD, mild aortic atherosclerosis 01/05/2023 chest x-ray PA and lateral: Infiltrates in the right middle lobe favor to be infectious/inflammatory 02/23/2023 chest x-ray PA and lateral: Improved though incompletely resolved middle lobe infection, minimal residual, emphysema 05/25/2023 CT chest without contrast: No acute cardiopulmonary disease, moderate to severe centrilobular emphysematous disease without significant change.  Borderline cardiomegaly unchanged.  Atherosclerotic coronary artery disease.  Stable mild superior endplate compression deformity mid thoracic spine. 05/25/2023 PFTs: FEV1 1.0 L or 42% predicted, FVC 2.44 L or 78% predicted, FEV1/FVC 41%, there is a reversible component with FEV1 improvement by 17% after bronchodilator.  There is hyperinflation and air trapping.  Diffusion capacity  severely impaired, this is consistent with emphysema and  reversible airways component.  Lung function has shown modest decrease from prior.  Review of Systems A 10 point review of systems was performed and it is as noted above otherwise negative.   Patient Active Problem List   Diagnosis Date Noted   Rash and nonspecific skin eruption 05/22/2023   Facial tingling sensation 05/22/2023   Pre-diabetes 04/08/2021   Vitamin D deficiency 04/08/2021   COVID-19 vaccination declined 12/04/2020   Centrilobular emphysema (HCC) 04/08/2020   Nodule of upper lobe of left lung 04/08/2020   Aortic atherosclerosis (HCC) 05/21/2019   Coronary artery calcification seen on CT scan 05/21/2019   Former smoker 05/21/2019   Hyperlipidemia 05/21/2019    Social History   Tobacco Use   Smoking status: Former    Current packs/day: 0.00    Average packs/day: 0.8 packs/day for 41.0 years (30.8 ttl pk-yrs)    Types: Cigarettes    Start date: 12/07/1974    Quit date: 12/08/2015    Years since quitting: 7.8   Smokeless tobacco: Never  Substance Use Topics   Alcohol use: No    No Known Allergies  Current Meds  Medication Sig   albuterol (PROVENTIL) (2.5 MG/3ML) 0.083% nebulizer solution USE 1 VIAL VIA NEBULIZER EVERY 6 HOURS AS NEEDED FOR WHEEZING OR SHORTNESS OF BREATH   albuterol (VENTOLIN HFA) 108 (90 Base) MCG/ACT inhaler INHALE 2 PUFFS INTO THE LUNGS EVERY 4 HOURS AS NEEDED FOR WHEEZING OR SHORTNESS OF BREATH   alendronate (FOSAMAX) 70 MG tablet Take 70 mg by mouth once a week.   aspirin EC 81 MG tablet Take 1 tablet (81 mg total) by mouth daily.   busPIRone (BUSPAR) 5 MG tablet TAKE 1 TABLET BY MOUTH TWICE DAILY AS NEEDED FOR ANXIETY   Calcium Carbonate-Vit D-Min (CALCIUM 1200) 1200-1000 MG-UNIT CHEW Chew 1,200 mg by mouth daily.   Cholecalciferol (VITAMIN D3) 125 MCG (5000 UT) TABS Take by mouth. Vitamin D3 5,000 IU daily for 12 weeks then reduce to OTC Vitamin D3 2,000 IU daily for maintenance   ezetimibe (ZETIA) 10 MG tablet Take 1 tablet (10 mg total) by  mouth daily.   famotidine (PEPCID) 20 MG tablet Take 1 tablet (20 mg total) by mouth 2 (two) times daily as needed.   feeding supplement (ENSURE ENLIVE / ENSURE PLUS) LIQD Take 237 mLs by mouth 2 (two) times daily between meals. (Patient taking differently: Take 237 mLs by mouth as needed.)   montelukast (SINGULAIR) 10 MG tablet TAKE 1 TABLET BY MOUTH AT BEDTIME   Multiple Vitamin (MULTIVITAMIN WITH MINERALS) TABS tablet Take 1 tablet by mouth daily.   OHTUVAYRE 3 MG/2.5ML SUSP Inhale 3 mg into the lungs 2 (two) times daily.   omeprazole (PRILOSEC) 20 MG capsule Take 1 capsule (20 mg total) by mouth 2 (two) times daily before a meal.   OXYGEN Inhale 3 L into the lungs at bedtime as needed.   rosuvastatin (CRESTOR) 10 MG tablet Take 1 tablet (10 mg total) by mouth daily.   sodium chloride (OCEAN) 0.65 % SOLN nasal spray Place 1 spray into both nostrils as needed for congestion.   Specialty Vitamins Products (HAIR NOURISHING SUPPLEMENT PO) Take 1 tablet by mouth daily.   tiZANidine (ZANAFLEX) 2 MG tablet Take 1 tablet (2 mg total) by mouth every 8 (eight) hours as needed for muscle spasms.   triamcinolone cream (KENALOG) 0.5 % Apply 1 Application topically 2 (two) times daily as needed (allergic reaction on skin  swelling). To affected areas, for up to 2 weeks.   [DISCONTINUED] Budeson-Glycopyrrol-Formoterol (BREZTRI AEROSPHERE) 160-9-4.8 MCG/ACT AERO Inhale 2 puffs into the lungs in the morning and at bedtime.    Immunization History  Administered Date(s) Administered   [employer] Vaccination 05/26/2021, 06/24/2021        Objective:     BP 94/64 (BP Location: Left Arm, Patient Position: Sitting, Cuff Size: Normal)   Pulse 87   Temp 97.6 F (36.4 C) (Temporal)   Ht 5\' 4"  (1.626 m)   Wt 126 lb 3.2 oz (57.2 kg)   SpO2 95%   BMI 21.66 kg/m   SpO2: 95 %  GENERAL: Well-developed, well-nourished woman, no acute distress.  Presents in transport chair due to dyspnea.   Uncomfortable but no tachypnea, no conversational dyspnea.   HEAD: Normocephalic, atraumatic.  EYES: Pupils equal, round, reactive to light.  No scleral icterus.  MOUTH: Poor dentition. She does have visible nasal septal deviation.  Oral mucosa moist, no thrush. NECK: Supple. No thyromegaly. Trachea midline. No JVD.  No adenopathy. PULMONARY: Distant breath sounds, fair air movement. Coarse, otherwise no adventitious sounds. CARDIOVASCULAR: S1 and S2. Regular rate and rhythm.  No rubs, murmurs or gallops heard. ABDOMEN: Benign. MUSCULOSKELETAL: No joint deformity, no clubbing, no edema.  NEUROLOGIC: No focal deficit, speech is fluent. SKIN: Intact,warm,dry. PSYCH: Mood and behavior normal..   Assessment & Plan:     ICD-10-CM   1. Stage 3 severe COPD by GOLD classification (HCC)  J44.9 AMB referral to pulmonary rehabilitation    2. Shortness of breath  R06.02     3. Nocturnal hypoxemia due to emphysema (HCC)  J43.9    G47.36     4. Former smoker  Z87.891       Orders Placed This Encounter  Procedures   AMB referral to pulmonary rehabilitation    Referral Priority:   Routine    Referral Type:   Consultation    Number of Visits Requested:   1    Meds ordered this encounter  Medications   Budeson-Glycopyrrol-Formoterol (BREZTRI AEROSPHERE) 160-9-4.8 MCG/ACT AERO    Sig: Inhale 2 puffs into the lungs in the morning and at bedtime.    Dispense:  3 each    Refill:  3    Order Specific Question:   Lot Number?    Answer:   [id number]    Order Specific Question:   Expiration Date?    Answer:   12/07/2025    Order Specific Question:   Manufacturer?    Answer:   [employer] [[id number]]    Order Specific Question:   Quantity    Answer:   2   Discussion:    Chronic Obstructive Pulmonary Disease (COPD)   Patient has been using a new nebulizer for several weeks with minimal improvement. The nebulizer targets inflammation, which may take time to notice effects. She has not attended  pulmonary rehab but is engaged in a COPD management program. She completed a recent course of antibiotics and is using Breztri, which she finds more effective and easier to use than Trelegy. She is also on Singulair for allergies and has sinus congestion treated with Bactrim DS. Discussed that pulmonary rehab can help build stamina and improve breathing. Explained that inflammation should be addressed with the nebulizer rather than antibiotics.   - Continue nebulizer with Ohtuvayre   - Ensure enrollment in [place] free medicine program   - Coordinate pulmonary rehab with outreach nurse for home-based or hospital-based programs [place])  -  Continue Singulair nightly for allergy management    General Health Maintenance   Patient has not received a flu shot and is fearful due to a family member's adverse reaction. She is hesitant about the RSV vaccine but is open to preventive measures. Discussed benefits and risks of the flu shot and RSV vaccine, noting last year's RSV prevalence.   - Discuss benefits and risks of flu shot and RSV vaccine   - Recommend flu shot and RSV vaccine, respecting patient's decision    Nocturnal Hypoxemia due to Emphysema Patient compliant with nocturnal oxygen, notes benefit of therapy, continue same.  Follow-up   - Schedule follow-up appointment in three months.      Gailen Shelter, MD Advanced Bronchoscopy PCCM [facility]    *This note was generated using voice recognition software/Dragon and/or AI transcription program.  Despite best efforts to proofread, errors can occur which can change the meaning. Any transcriptional errors that result from this process are unintentional and may not be fully corrected at the time of dictation.

## 2023-10-27 NOTE — Patient Instructions (Addendum)
VISIT SUMMARY:  During today's visit, we discussed your ongoing management of chronic obstructive pulmonary disease (COPD) and reviewed your current treatments. We also talked about your concerns regarding the flu and RSV vaccines.  YOUR PLAN:  -CHRONIC OBSTRUCTIVE PULMONARY DISEASE (COPD): COPD is a chronic lung condition that makes it hard to breathe. You have been using a new nebulizer for several weeks, which targets inflammation, but it may take time to see improvement. You are also using Breztri, which you find easier to use than Trelegy, and Singulair nightly for allergies. We discussed the importance of pulmonary rehabilitation to help improve your breathing and stamina. You should continue using the nebulizer with Ohtuvayre, ensure you are enrolled in the [place] free medicine program, and coordinate with the outreach nurse for pulmonary rehab options. We will place a request to [facility] rehab which can be done at home. Continue taking Singulair nightly for allergy management.  -NOCTURNAL HYPOXEMIA: Continue using your oxygen at nighttime at 2 L/min.  -GENERAL HEALTH MAINTENANCE: We discussed your concerns about the flu and RSV vaccines. The flu shot and RSV vaccine can help prevent serious respiratory infections. While you are hesitant due to a family member's adverse reaction, we reviewed the benefits and risks of these vaccines. It is recommended to get both vaccines, but we respect your decision and encourage you to consider preventive measures.  INSTRUCTIONS:  Please schedule a follow-up appointment in three months.

## 2023-11-01 ENCOUNTER — Other Ambulatory Visit: Payer: Self-pay

## 2023-11-02 ENCOUNTER — Other Ambulatory Visit: Payer: Self-pay | Admitting: Family Medicine

## 2023-11-03 NOTE — Telephone Encounter (Signed)
Requested Prescriptions  Pending Prescriptions Disp Refills   montelukast (SINGULAIR) 10 MG tablet [Pharmacy Med Name: MONTELUKAST SODIUM 10 MG TAB] 90 tablet 0    Sig: TAKE 1 TABLET BY MOUTH AT BEDTIME     Pulmonology:  Leukotriene Inhibitors Passed - 11/02/2023  3:20 PM      Passed - Valid encounter within last 12 months    Recent Outpatient Visits           1 month ago Chronic obstructive pulmonary disease, unspecified COPD type [facility])   [place] [facility] [place], Salvadore Oxford, NP   6 months ago Allergy to ant bite   [place] [facility] [name], DO   7 months ago Post herpetic neuralgia   [place] [facility] [name], DO   7 months ago Herpes zoster without complication   [place] [facility] [place], [name], DO   8 months ago Community acquired pneumonia of left lung, unspecified part of lung   [place] [facility] [place], Salvadore Oxford, NP       Future Appointments             In 2 months [name], MD [facility] at [facility]

## 2023-11-10 ENCOUNTER — Telehealth: Payer: Self-pay | Admitting: Pulmonary Disease

## 2023-11-10 DIAGNOSIS — Z008 Encounter for other general examination: Secondary | ICD-10-CM | POA: Diagnosis not present

## 2023-11-10 DIAGNOSIS — J441 Chronic obstructive pulmonary disease with (acute) exacerbation: Secondary | ICD-10-CM | POA: Diagnosis not present

## 2023-11-10 NOTE — Telephone Encounter (Signed)
We Rec'd note from [facility] the patient's insurance will not cover their program

## 2023-11-16 ENCOUNTER — Other Ambulatory Visit: Payer: Self-pay | Admitting: Family Medicine

## 2023-11-16 DIAGNOSIS — J432 Centrilobular emphysema: Secondary | ICD-10-CM

## 2023-11-17 ENCOUNTER — Other Ambulatory Visit: Payer: Self-pay

## 2023-11-17 ENCOUNTER — Telehealth: Payer: Self-pay

## 2023-11-17 NOTE — Telephone Encounter (Signed)
Requested Prescriptions  Pending Prescriptions Disp Refills   albuterol (VENTOLIN HFA) 108 (90 Base) MCG/ACT inhaler [Pharmacy Med Name: ALBUTEROL SULFATE HFA 108 (90 BASE)] 8.5 g 2    Sig: INHALE 2 PUFFS INTO THE LUNGS EVERY 4 HOURS AS NEEDED FOR WHEEZING OR SHORTNESS OF BREATH     Pulmonology:  Beta Agonists 2 Passed - 11/16/2023  2:32 PM      Passed - Last BP in normal range    BP Readings from Last 1 Encounters:  10/27/23 94/64         Passed - Last Heart Rate in normal range    Pulse Readings from Last 1 Encounters:  10/27/23 87         Passed - Valid encounter within last 12 months    Recent Outpatient Visits           2 months ago Chronic obstructive pulmonary disease, unspecified COPD type [facility])   [place] [facility] [place], Salvadore Oxford, NP   7 months ago Allergy to ant bite   [place] [facility] [place], [name], DO   7 months ago Post herpetic neuralgia   [place] [facility] [name], DO   8 months ago Herpes zoster without complication   [place] [facility] [place], [name], DO   8 months ago Community acquired pneumonia of left lung, unspecified part of lung   [place] [facility] [place], Salvadore [place], NP       Future Appointments             In 2 months [name], MD [facility] at [facility]

## 2023-11-17 NOTE — Patient Outreach (Signed)
  Care Management   Outreach Note  11/17/2023 Name: Brandi [name] MRN: [id number] DOB: 1956/09/24  An unsuccessful outreach attempt was made today for a scheduled Care Management visit.    Follow Up Plan:  A HIPAA compliant phone message was left for the patient providing contact information and requesting a return call.    [facility]  [place], [facility] RN Care Manager Direct Dial: [phone] Website: [email or web address]

## 2023-11-17 NOTE — Patient Outreach (Signed)
  Care Management   Visit Note  11/17/2023 Name: Brandi [name] MRN: [id number] DOB: [birth year]  Subjective: Brandi [name] is a 67 y.o. year old female who is a primary care patient of [name], DO. The Care Management team was consulted for assistance.      Engaged with Mr. Lehne via telephone.  Assessment:  Review of patient past medical history, allergies, medications, health status, including review of consultants reports, laboratory and other test data, was performed as part of  evaluation and provision of care management services.    Outpatient Encounter Medications as of 11/17/2023  Medication Sig Note   albuterol (VENTOLIN HFA) 108 (90 Base) MCG/ACT inhaler INHALE 2 PUFFS INTO THE LUNGS EVERY 4 HOURS AS NEEDED FOR WHEEZING OR SHORTNESS OF BREATH    alendronate (FOSAMAX) 70 MG tablet Take 70 mg by mouth once a week.    aspirin EC 81 MG tablet Take 1 tablet (81 mg total) by mouth daily.    Budeson-Glycopyrrol-Formoterol (BREZTRI AEROSPHERE) 160-9-4.8 MCG/ACT AERO Inhale 2 puffs into the lungs in the morning and at bedtime.    busPIRone (BUSPAR) 5 MG tablet TAKE 1 TABLET BY MOUTH TWICE DAILY AS NEEDED FOR ANXIETY    Cholecalciferol (VITAMIN D3) 125 MCG (5000 UT) TABS Take by mouth. Vitamin D3 5,000 IU daily for 12 weeks then reduce to OTC Vitamin D3 2,000 IU daily for maintenance    ezetimibe (ZETIA) 10 MG tablet Take 1 tablet (10 mg total) by mouth daily.    famotidine (PEPCID) 20 MG tablet Take 1 tablet (20 mg total) by mouth 2 (two) times daily as needed.    montelukast (SINGULAIR) 10 MG tablet TAKE 1 TABLET BY MOUTH AT BEDTIME    Multiple Vitamin (MULTIVITAMIN WITH MINERALS) TABS tablet Take 1 tablet by mouth daily. 11/17/2023: Reports not taking daily   OHTUVAYRE 3 MG/2.5ML SUSP Inhale 3 mg into the lungs 2 (two) times daily.    omeprazole (PRILOSEC) 20 MG capsule Take 1 capsule (20 mg total) by mouth 2 (two) times daily before a meal.    OXYGEN Inhale 3 L into the  lungs at bedtime as needed. 11/17/2023: Reports currently using 4 min/l   rosuvastatin (CRESTOR) 10 MG tablet Take 1 tablet (10 mg total) by mouth daily. 11/17/2023: Reports not taking daily. Reports taking a few times a week at night.   sodium chloride (OCEAN) 0.65 % SOLN nasal spray Place 1 spray into both nostrils as needed for congestion.    tiZANidine (ZANAFLEX) 2 MG tablet Take 1 tablet (2 mg total) by mouth every 8 (eight) hours as needed for muscle spasms.    triamcinolone cream (KENALOG) 0.5 % Apply 1 Application topically 2 (two) times daily as needed (allergic reaction on skin swelling). To affected areas, for up to 2 weeks.    albuterol (PROVENTIL) (2.5 MG/3ML) 0.083% nebulizer solution USE 1 VIAL VIA NEBULIZER EVERY 6 HOURS AS NEEDED FOR WHEEZING OR SHORTNESS OF BREATH    Calcium Carbonate-Vit D-Min (CALCIUM 1200) 1200-1000 MG-UNIT CHEW Chew 1,200 mg by mouth daily.    feeding supplement (ENSURE ENLIVE / ENSURE PLUS) LIQD Take 237 mLs by mouth 2 (two) times daily between meals. (Patient not taking: Reported on 11/17/2023)    Specialty Vitamins Products (HAIR NOURISHING SUPPLEMENT PO) Take 1 tablet by mouth daily.    No facility-administered encounter medications on file as of 11/17/2023.

## 2023-11-18 ENCOUNTER — Other Ambulatory Visit: Payer: Self-pay

## 2023-11-23 DIAGNOSIS — J449 Chronic obstructive pulmonary disease, unspecified: Secondary | ICD-10-CM | POA: Diagnosis not present

## 2023-11-29 ENCOUNTER — Ambulatory Visit
Admission: RE | Admit: 2023-11-29 | Discharge: 2023-11-29 | Disposition: A | Discharge status: Home / Self Care | Payer: No Typology Code available for payment source | Source: Ambulatory Visit | Attending: Family Medicine | Admitting: Family Medicine

## 2023-11-29 ENCOUNTER — Encounter: Payer: Self-pay | Admitting: Family Medicine

## 2023-11-29 DIAGNOSIS — Z008 Encounter for other general examination: Secondary | ICD-10-CM | POA: Diagnosis not present

## 2023-11-29 DIAGNOSIS — Z780 Asymptomatic menopausal state: Secondary | ICD-10-CM | POA: Diagnosis not present

## 2023-11-29 DIAGNOSIS — J441 Chronic obstructive pulmonary disease with (acute) exacerbation: Secondary | ICD-10-CM | POA: Diagnosis not present

## 2023-11-29 DIAGNOSIS — M810 Age-related osteoporosis without current pathological fracture: Secondary | ICD-10-CM | POA: Insufficient documentation

## 2023-12-02 ENCOUNTER — Other Ambulatory Visit: Payer: Self-pay

## 2023-12-02 NOTE — Patient Outreach (Signed)
  Care Management   Visit Note  12/02/2023 Name: Brandi [name] MRN: [id number] DOB: 07-Jun-1956  Subjective: Brandi [name] is a 67 y.o. year old female who is a primary care patient of [name], DO. The Care Management team was consulted for assistance.      Engaged with Mr. Haslett via telephone.  Assessment:  Review of patient past medical history, allergies, medications, health status, including review of consultants reports, laboratory and other test data, was performed as part of  evaluation and provision of care management services.    Outpatient Encounter Medications as of 12/02/2023  Medication Sig Note   albuterol (PROVENTIL) (2.5 MG/3ML) 0.083% nebulizer solution USE 1 VIAL VIA NEBULIZER EVERY 6 HOURS AS NEEDED FOR WHEEZING OR SHORTNESS OF BREATH    albuterol (VENTOLIN HFA) 108 (90 Base) MCG/ACT inhaler INHALE 2 PUFFS INTO THE LUNGS EVERY 4 HOURS AS NEEDED FOR WHEEZING OR SHORTNESS OF BREATH    alendronate (FOSAMAX) 70 MG tablet Take 70 mg by mouth once a week.    aspirin EC 81 MG tablet Take 1 tablet (81 mg total) by mouth daily.    Budeson-Glycopyrrol-Formoterol (BREZTRI AEROSPHERE) 160-9-4.8 MCG/ACT AERO Inhale 2 puffs into the lungs in the morning and at bedtime.    busPIRone (BUSPAR) 5 MG tablet TAKE 1 TABLET BY MOUTH TWICE DAILY AS NEEDED FOR ANXIETY    Calcium Carbonate-Vit D-Min (CALCIUM 1200) 1200-1000 MG-UNIT CHEW Chew 1,200 mg by mouth daily.    Cholecalciferol (VITAMIN D3) 125 MCG (5000 UT) TABS Take by mouth. Vitamin D3 5,000 IU daily for 12 weeks then reduce to OTC Vitamin D3 2,000 IU daily for maintenance    ezetimibe (ZETIA) 10 MG tablet Take 1 tablet (10 mg total) by mouth daily.    famotidine (PEPCID) 20 MG tablet Take 1 tablet (20 mg total) by mouth 2 (two) times daily as needed.    feeding supplement (ENSURE ENLIVE / ENSURE PLUS) LIQD Take 237 mLs by mouth 2 (two) times daily between meals. (Patient not taking: Reported on 11/17/2023)     montelukast (SINGULAIR) 10 MG tablet TAKE 1 TABLET BY MOUTH AT BEDTIME    Multiple Vitamin (MULTIVITAMIN WITH MINERALS) TABS tablet Take 1 tablet by mouth daily. 11/17/2023: Reports not taking daily   OHTUVAYRE 3 MG/2.5ML SUSP Inhale 3 mg into the lungs 2 (two) times daily.    omeprazole (PRILOSEC) 20 MG capsule Take 1 capsule (20 mg total) by mouth 2 (two) times daily before a meal.    OXYGEN Inhale 3 L into the lungs at bedtime as needed. 11/17/2023: Reports currently using 4 min/l   rosuvastatin (CRESTOR) 10 MG tablet Take 1 tablet (10 mg total) by mouth daily. 11/17/2023: Reports not taking daily. Reports taking a few times a week at night.   sodium chloride (OCEAN) 0.65 % SOLN nasal spray Place 1 spray into both nostrils as needed for congestion.    Specialty Vitamins Products (HAIR NOURISHING SUPPLEMENT PO) Take 1 tablet by mouth daily.    tiZANidine (ZANAFLEX) 2 MG tablet Take 1 tablet (2 mg total) by mouth every 8 (eight) hours as needed for muscle spasms.    triamcinolone cream (KENALOG) 0.5 % Apply 1 Application topically 2 (two) times daily as needed (allergic reaction on skin swelling). To affected areas, for up to 2 weeks.    No facility-administered encounter medications on file as of 12/02/2023.

## 2023-12-06 NOTE — Patient Outreach (Signed)
  Care Management   Visit Note   Name: Brandi [name] MRN: [id number] DOB: 02/04/1956  Subjective: Brandi [name] is a 67 y.o. year old female who is a primary care patient of [name], DO. The Care Management team was consulted for assistance.      Engaged with [name] via telephone.  Assessment:  Review of patient past medical history, allergies, medications, health status, including review of consultants reports, laboratory and other test data, was performed as part of  evaluation and provision of care management services.    Outpatient Encounter Medications as of 11/18/2023  Medication Sig Note   albuterol (PROVENTIL) (2.5 MG/3ML) 0.083% nebulizer solution USE 1 VIAL VIA NEBULIZER EVERY 6 HOURS AS NEEDED FOR WHEEZING OR SHORTNESS OF BREATH    albuterol (VENTOLIN HFA) 108 (90 Base) MCG/ACT inhaler INHALE 2 PUFFS INTO THE LUNGS EVERY 4 HOURS AS NEEDED FOR WHEEZING OR SHORTNESS OF BREATH    alendronate (FOSAMAX) 70 MG tablet Take 70 mg by mouth once a week.    aspirin EC 81 MG tablet Take 1 tablet (81 mg total) by mouth daily.    Budeson-Glycopyrrol-Formoterol (BREZTRI AEROSPHERE) 160-9-4.8 MCG/ACT AERO Inhale 2 puffs into the lungs in the morning and at bedtime.    busPIRone (BUSPAR) 5 MG tablet TAKE 1 TABLET BY MOUTH TWICE DAILY AS NEEDED FOR ANXIETY    Calcium Carbonate-Vit D-Min (CALCIUM 1200) 1200-1000 MG-UNIT CHEW Chew 1,200 mg by mouth daily.    Cholecalciferol (VITAMIN D3) 125 MCG (5000 UT) TABS Take by mouth. Vitamin D3 5,000 IU daily for 12 weeks then reduce to OTC Vitamin D3 2,000 IU daily for maintenance    ezetimibe (ZETIA) 10 MG tablet Take 1 tablet (10 mg total) by mouth daily.    famotidine (PEPCID) 20 MG tablet Take 1 tablet (20 mg total) by mouth 2 (two) times daily as needed.    feeding supplement (ENSURE ENLIVE / ENSURE PLUS) LIQD Take 237 mLs by mouth 2 (two) times daily between meals. (Patient not taking: Reported on 11/17/2023)    montelukast  (SINGULAIR) 10 MG tablet TAKE 1 TABLET BY MOUTH AT BEDTIME    Multiple Vitamin (MULTIVITAMIN WITH MINERALS) TABS tablet Take 1 tablet by mouth daily. 11/17/2023: Reports not taking daily   OHTUVAYRE 3 MG/2.5ML SUSP Inhale 3 mg into the lungs 2 (two) times daily.    omeprazole (PRILOSEC) 20 MG capsule Take 1 capsule (20 mg total) by mouth 2 (two) times daily before a meal.    OXYGEN Inhale 3 L into the lungs at bedtime as needed. 11/17/2023: Reports currently using 4 min/l   rosuvastatin (CRESTOR) 10 MG tablet Take 1 tablet (10 mg total) by mouth daily. 11/17/2023: Reports not taking daily. Reports taking a few times a week at night.   sodium chloride (OCEAN) 0.65 % SOLN nasal spray Place 1 spray into both nostrils as needed for congestion.    Specialty Vitamins Products (HAIR NOURISHING SUPPLEMENT PO) Take 1 tablet by mouth daily.    tiZANidine (ZANAFLEX) 2 MG tablet Take 1 tablet (2 mg total) by mouth every 8 (eight) hours as needed for muscle spasms.    triamcinolone cream (KENALOG) 0.5 % Apply 1 Application topically 2 (two) times daily as needed (allergic reaction on skin swelling). To affected areas, for up to 2 weeks.    No facility-administered encounter medications on file as of 11/18/2023.

## 2023-12-15 ENCOUNTER — Telehealth: Payer: Self-pay

## 2023-12-15 ENCOUNTER — Other Ambulatory Visit: Payer: Self-pay

## 2023-12-15 NOTE — Patient Outreach (Signed)
  Care Management   Outreach Note  12/15/2023 Name: Brandi [name] MRN: [id number] DOB: [birth year]  An unsuccessful outreach attempt was made today for a scheduled Care Management visit.    Follow Up Plan:  A HIPAA compliant phone message was left for the patient providing contact information and requesting a return call.   [facility]  [facility], [facility] RN Care Manager Direct Dial: [phone] Website: [email or web address]

## 2023-12-16 DIAGNOSIS — J441 Chronic obstructive pulmonary disease with (acute) exacerbation: Secondary | ICD-10-CM | POA: Diagnosis not present

## 2023-12-16 DIAGNOSIS — Z008 Encounter for other general examination: Secondary | ICD-10-CM | POA: Diagnosis not present

## 2023-12-24 DIAGNOSIS — R0600 Dyspnea, unspecified: Secondary | ICD-10-CM | POA: Diagnosis not present

## 2023-12-24 DIAGNOSIS — Z008 Encounter for other general examination: Secondary | ICD-10-CM | POA: Diagnosis not present

## 2023-12-27 DIAGNOSIS — Z008 Encounter for other general examination: Secondary | ICD-10-CM | POA: Diagnosis not present

## 2023-12-27 DIAGNOSIS — B3731 Acute candidiasis of vulva and vagina: Secondary | ICD-10-CM | POA: Diagnosis not present

## 2024-01-14 ENCOUNTER — Other Ambulatory Visit: Payer: Self-pay

## 2024-01-14 ENCOUNTER — Encounter: Payer: Self-pay | Admitting: Family Medicine

## 2024-01-14 ENCOUNTER — Ambulatory Visit: Payer: No Typology Code available for payment source | Admitting: Family Medicine

## 2024-01-14 VITALS — BP 120/62 | HR 98 | Resp 20 | Ht 64.0 in | Wt 124.6 lb

## 2024-01-14 DIAGNOSIS — J432 Centrilobular emphysema: Secondary | ICD-10-CM

## 2024-01-14 DIAGNOSIS — M816 Localized osteoporosis [Lequesne]: Secondary | ICD-10-CM

## 2024-01-14 DIAGNOSIS — J0110 Acute frontal sinusitis, unspecified: Secondary | ICD-10-CM

## 2024-01-14 DIAGNOSIS — R0781 Pleurodynia: Secondary | ICD-10-CM | POA: Diagnosis not present

## 2024-01-14 MED ORDER — ALBUTEROL SULFATE HFA 108 (90 BASE) MCG/ACT IN AERS
2.0000 | INHALATION_SPRAY | RESPIRATORY_TRACT | 2 refills | Status: DC | PRN
Start: 2024-01-14 — End: 2024-08-03

## 2024-01-14 MED ORDER — DOXYCYCLINE HYCLATE 100 MG PO TABS
100.0000 mg | ORAL_TABLET | Freq: Two times a day (BID) | ORAL | 0 refills | Status: DC
Start: 2024-01-14 — End: 2024-02-02

## 2024-01-14 MED ORDER — PREDNISONE 20 MG PO TABS
ORAL_TABLET | ORAL | 0 refills | Status: DC
Start: 2024-01-14 — End: 2024-02-02

## 2024-01-14 MED ORDER — ALENDRONATE SODIUM 70 MG PO TABS
70.0000 mg | ORAL_TABLET | ORAL | 3 refills | Status: AC
Start: 2024-01-14 — End: ?

## 2024-01-14 NOTE — Progress Notes (Signed)
 Subjective:    Patient ID: Brandi [name], female    DOB: 06-15-1956, 68 y.o.   MRN: [id number]  Brandi [name] is a 68 y.o. female presenting on 01/14/2024 for Sinusitis (Cough and congestion getting worse for 3 days. )   HPI  Discussed the use of AI scribe software for clinical note transcription with the patient, who gave verbal consent to proceed.  History of Present Illness    Brandi [name] is a 68 year old female with COPD who presents with worsening sinus congestion and chest wall pain with cough.  She has been experiencing worsening sinus congestion and frontal sinus pain, predominantly on the right side, over the past three days. There is pressure and congestion with discharge, and she has been coughing up mucus. A significant episode of coughing up mucus occurred last Wednesday, approximately a week ago, but the sinus pressure has increased in the last three days.  She has been on multiple rounds of antibiotics and prednisone  since September, including Levaquin , a sulfa  drug, and amoxicillin , with the most recent course ending about two weeks ago. While on antibiotics, her symptoms improve, but they return once the medication is stopped.  She has a history of COPD and is currently using Breztri  inhaler, two puffs twice a day. She previously used Trelegy but experienced issues with it. No frequent coughing, but she mentions rib pain under the left ribcage, which she attributes to possible muscle strain or a past shingles episode. The rib pain is described as soreness without sharpness or burning.  She experiences occasional chest pain, which she felt last night, and notes that cold weather exacerbates her symptoms. She is unsure about having fevers but mentions feeling chills occasionally.  She reports a persistent knot on her forehead from a past injury, which she questions might affect her sinuses.  She takes cough pills regularly, which contain menthol, to help with her symptoms.  She also mentions being out of her osteoporosis medication and requests a refill.        09/09/2023    1:33 PM 03/26/2023   10:03 AM 03/03/2023    3:24 PM  Depression screen PHQ 2/9  Decreased Interest 0 0 1  Down, Depressed, Hopeless 0 0 0  PHQ - 2 Score 0 0 1  Altered sleeping 0 0   Tired, decreased energy 0 0   Change in appetite 0 0   Feeling bad or failure about yourself  0 0   Trouble concentrating 0 0   Moving slowly or fidgety/restless 0 0   Suicidal thoughts 0 0   PHQ-9 Score 0 0   Difficult doing work/chores Not difficult at all Not difficult at all        03/26/2023   10:03 AM 02/12/2022   10:42 AM 08/12/2021   11:07 AM 05/06/2021   11:08 AM  GAD 7 : Generalized Anxiety Score  Nervous, Anxious, on Edge 0 0 0 0  Control/stop worrying 0 0 0 0  Worry too much - different things 0 0 0 0  Trouble relaxing 0 0 0 0  Restless 0 0 0 0  Easily annoyed or irritable 0 0 0 0  Afraid - awful might happen 0 0 0 0  Total GAD 7 Score 0 0 0 0  Anxiety Difficulty Not difficult at all Not difficult at all Not difficult at all Not difficult at all    Social History   Tobacco Use   Smoking status: Former    Current  packs/day: 0.00    Average packs/day: 0.8 packs/day for 41.0 years (30.8 ttl pk-yrs)    Types: Cigarettes    Start date: 12/07/1974    Quit date: 12/08/2015    Years since quitting: 8.1   Smokeless tobacco: Never  Vaping Use   Vaping status: Never Used  Substance Use Topics   Alcohol use: No   Drug use: No    Review of Systems Per HPI unless specifically indicated above     Objective:    BP 120/62 (BP Location: Right Arm, Cuff Size: Normal)   Pulse 98   Resp 20   Ht 5' 4 (1.626 m)   Wt 124 lb 9.6 oz (56.5 kg)   SpO2 97%   BMI 21.39 kg/m   Wt Readings from Last 3 Encounters:  01/14/24 124 lb 9.6 oz (56.5 kg)  10/27/23 126 lb 3.2 oz (57.2 kg)  09/16/23 123 lb (55.8 kg)    Physical Exam Vitals and nursing note reviewed.  Constitutional:      General:  She is not in acute distress.    Appearance: She is well-developed. She is not diaphoretic.     Comments: Well-appearing, comfortable, cooperative  HENT:     Head: Normocephalic and atraumatic.  Eyes:     General:        Right eye: No discharge.        Left eye: No discharge.     Conjunctiva/sclera: Conjunctivae normal.  Neck:     Thyroid : No thyromegaly.  Cardiovascular:     Rate and Rhythm: Normal rate and regular rhythm.     Heart sounds: Normal heart sounds. No murmur heard. Pulmonary:     Effort: Pulmonary effort is normal. No respiratory distress.     Breath sounds: Wheezing present. No rales.     Comments: Reudced air movement Musculoskeletal:        General: Normal range of motion.     Cervical back: Normal range of motion and neck supple.     Right lower leg: No edema.     Left lower leg: No edema.  Lymphadenopathy:     Cervical: No cervical adenopathy.  Skin:    General: Skin is warm and dry.     Findings: No erythema or rash (No rash Left flank).  Neurological:     Mental Status: She is alert and oriented to person, place, and time.  Psychiatric:        Behavior: Behavior normal.     Comments: Well groomed, good eye contact, normal speech and thoughts     Results for orders placed or performed in visit on 09/16/23  Cytology - PAP   Collection Time: 09/16/23  2:34 PM  Result Value Ref Range   High risk HPV Negative    Adequacy      Satisfactory for evaluation; transformation zone component PRESENT.   Diagnosis      - Negative for intraepithelial lesion or malignancy (NILM)   Comment Normal Reference Range HPV - Negative       Assessment & Plan:   Problem List Items Addressed This Visit     Centrilobular emphysema (HCC)   Relevant Medications   predniSONE  (DELTASONE ) 20 MG tablet   Other Relevant Orders   DG Chest 2 View   Osteoporosis   Relevant Medications   alendronate  (FOSAMAX ) 70 MG tablet   Other Visit Diagnoses       Acute non-recurrent  frontal sinusitis    -  Primary   Relevant  Medications   doxycycline  (VIBRA -TABS) 100 MG tablet   predniSONE  (DELTASONE ) 20 MG tablet     Rib pain on left side            Chronic Sinusitis Persistent frontal sinus pain, pressure, and congestion for over a week, with worsening symptoms in the past three days. Multiple rounds of antibiotics since September with temporary relief but symptoms recur after discontinuation. -Order chest X-ray to be done next week if no improvement. -Consider referral to ENT specialist for further evaluation and management of sinuses after pulmonology follow-up.  COPD Followed by Pulmonology Chronic cough and decreased air movement on auscultation. Currently on Breztri  inhaler with some relief. Continue as prescribed -Upcoming follow-up with pulmonologist on 01/27/2024 for further management.  Contingency Plan - if worsening evidence of COPD flare -If worsening, patient has been prescribed a course of Doxycycline  and Prednisone  taper to be taken together. -If interested in ENT referral, patient to contact office.   Osteoporosis On medication Alendronate  for bone density, currently out of medication. -Reorder bone density medication.  Rib Pain Complaint of left rib pain for about a week, possibly due to chronic coughing or unrelated musculoskeletal issue. No signs of shingles. -Observe and manage symptomatically.          Orders Placed This Encounter  Procedures   DG Chest 2 View    Standing Status:   Future    Expiration Date:   01/13/2025    Reason for Exam (SYMPTOM  OR DIAGNOSIS REQUIRED):   emphysema    Preferred imaging location?:   [facility]    Meds ordered this encounter  Medications   doxycycline  (VIBRA -TABS) 100 MG tablet    Sig: Take 1 tablet (100 mg total) by mouth 2 (two) times daily. For 10 days. Take with full glass of water, stay upright 30 min after taking.    Dispense:  20 tablet    Refill:  0   predniSONE  (DELTASONE )  20 MG tablet    Sig: Take daily with food. Start with 60mg  (3 pills) x 2 days, then reduce to 40mg  (2 pills) x 2 days, then 20mg  (1 pill) x 3 days    Dispense:  13 tablet    Refill:  0   alendronate  (FOSAMAX ) 70 MG tablet    Sig: Take 1 tablet (70 mg total) by mouth once a week.    Dispense:  12 tablet    Refill:  3    Follow up plan: Return if symptoms worsen or fail to improve.   [name], DO [facility] 01/14/2024, 8:41 AM

## 2024-01-14 NOTE — Patient Instructions (Addendum)
 Thank you for coming to the office today.  If worsening -   Start taking Doxycycline  antibiotic 100mg  twice daily for 10 days. Take with full glass of water and stay upright for at least 30 min after taking, may be seated or standing, but should NOT lay down. This is just a safety precaution, if this medicine does not go all the way down throat well it could cause some burning discomfort to throat and esophagus. Also NOTE - do not take medicine within 2 hours (before or after) consuming dairy or foods / vitamins containing high calcium  or iron.  And Prednisone  taper  Keep up with Lung Doctor  If need we can refer to ENT Sinus specialist  Osteoporosis on Bone Density Scan - Restart Alendronate  Fosamax   Please schedule a Follow-up Appointment to: Return if symptoms worsen or fail to improve.  If you have any other questions or concerns, please feel free to call the office or send a message through MyChart. You may also schedule an earlier appointment if necessary.  Additionally, you may be receiving a survey about your experience at our office within a few days to 1 week by e-mail or mail. We value your feedback.  [name], DO [facility], [place]

## 2024-01-27 ENCOUNTER — Ambulatory Visit: Payer: No Typology Code available for payment source | Admitting: Pulmonary Disease

## 2024-01-31 ENCOUNTER — Ambulatory Visit
Admission: RE | Admit: 2024-01-31 | Discharge: 2024-01-31 | Disposition: A | Discharge status: Home / Self Care | Payer: No Typology Code available for payment source | Source: Ambulatory Visit | Attending: Family Medicine | Admitting: Family Medicine

## 2024-01-31 ENCOUNTER — Ambulatory Visit
Admission: RE | Admit: 2024-01-31 | Discharge: 2024-01-31 | Disposition: A | Discharge status: Home / Self Care | Payer: No Typology Code available for payment source | Attending: Family Medicine | Admitting: Family Medicine

## 2024-01-31 DIAGNOSIS — R0602 Shortness of breath: Secondary | ICD-10-CM | POA: Diagnosis not present

## 2024-01-31 DIAGNOSIS — J439 Emphysema, unspecified: Secondary | ICD-10-CM | POA: Diagnosis not present

## 2024-01-31 DIAGNOSIS — J432 Centrilobular emphysema: Secondary | ICD-10-CM | POA: Insufficient documentation

## 2024-01-31 DIAGNOSIS — I700 Atherosclerosis of aorta: Secondary | ICD-10-CM | POA: Diagnosis not present

## 2024-02-01 ENCOUNTER — Telehealth: Payer: Self-pay

## 2024-02-01 DIAGNOSIS — J441 Chronic obstructive pulmonary disease with (acute) exacerbation: Secondary | ICD-10-CM | POA: Diagnosis not present

## 2024-02-01 DIAGNOSIS — Z008 Encounter for other general examination: Secondary | ICD-10-CM | POA: Diagnosis not present

## 2024-02-01 NOTE — Telephone Encounter (Signed)
 Copied from CRM [id number]. Topic: Clinical - Prescription Issue >> Feb 01, 2024  4:10 PM Shon Hale wrote: Reason for CRM: [place] calling with [facility] health with patient on line. Patient on oxygen at home. Patient in need of portable O2 Lightweight. Requesting signed order for Portable O2 Lightweight by Dr. Althea Charon with supporting clinical documentation.   Patient has been trying to get portable oxygen for about a month now, and has been going to pharmacy without oxygen as she has been unable to carry the oxygen she has now.   Order needs to go to [facility]: [phone] Fax: [phone]  Order needs to have the following: Overnight pulse oxygen reading of resting room rate of 88% or lower or 6 minute walking test, Document settings for liter per minute, hours of usage, needs to be very specific, as to when patient needs it (prn, when walking, with exertion, etc). X code with a J code, recent clinical notes from provider and physician signature, Letter of medical necessity, member needs something lower than 5 lbs.   Patient requesting callback once order is sent to [facility], [phone]

## 2024-02-01 NOTE — Telephone Encounter (Signed)
 Routing this request to patient's Pulmonology office at [facility].  Dr Jayme Cloud, would you be able to assist with new orders for Portable O2 Lightweight container for this patient? If your office already has her on Oxygen orders, can you send the appropriate documentation to support this request or she may need a new office visit for documentation to meet the requirements.  Thank you! Let me know if this is not possible and if you need our office to try, we would bring her in to be seen and attempt to get the correct documentation / necessity.  [name], DO [facility] 02/01/2024, 4:35 PM

## 2024-02-01 NOTE — Telephone Encounter (Signed)
 She has a follow-up appointment with me on 28 February.  I have explained these issues to her previously.  She qualified for NOCTURNAL oxygen but not for with ambulation.  Can reassess her but the 6-minute walk will have to be scheduled it cannot be done the same day as the visit.

## 2024-02-02 ENCOUNTER — Ambulatory Visit (INDEPENDENT_AMBULATORY_CARE_PROVIDER_SITE_OTHER): Payer: No Typology Code available for payment source | Admitting: Family Medicine

## 2024-02-02 ENCOUNTER — Encounter: Payer: Self-pay | Admitting: Family Medicine

## 2024-02-02 ENCOUNTER — Ambulatory Visit: Payer: Self-pay | Admitting: Family Medicine

## 2024-02-02 VITALS — BP 122/60 | HR 100 | Ht 64.0 in | Wt 123.0 lb

## 2024-02-02 DIAGNOSIS — J432 Centrilobular emphysema: Secondary | ICD-10-CM | POA: Diagnosis not present

## 2024-02-02 DIAGNOSIS — R091 Pleurisy: Secondary | ICD-10-CM | POA: Diagnosis not present

## 2024-02-02 DIAGNOSIS — R0781 Pleurodynia: Secondary | ICD-10-CM | POA: Diagnosis not present

## 2024-02-02 MED ORDER — NAPROXEN 500 MG PO TABS
500.0000 mg | ORAL_TABLET | Freq: Two times a day (BID) | ORAL | 2 refills | Status: DC
Start: 2024-02-02 — End: 2024-09-09

## 2024-02-02 NOTE — Patient Instructions (Addendum)
 Thank you for coming to the office today.  Recommend trial of Anti-inflammatory with Naproxen (Naprosyn) 500mg  tabs - take one with food and plenty of water TWICE daily every day (breakfast and dinner), for next 2 to 4 weeks, then you may take only as needed - DO NOT TAKE any ibuprofen, aleve, motrin while you are taking this medicine - It is safe to take Tylenol Ext Str 500mg  tabs - take 1 to 2 (max dose 1000mg ) every 6 hours as needed for breakthrough pain, max 24 hour daily dose is 6 to 8 tablets or 4000mg   Talk to Dr Jayme Cloud on Friday 2/28 about oxygen and other testing.  If not satisfied after that visit, let us know and we can refer to a different Pulmonology in the future. I would suggest a different system such as [facility] or [facility] etc. Otherwise [facility] is possible but it is the same group.  We can consider Allergist as well but I am not convinced. -------------------------  FINDINGS: Background of emphysema. No focal consolidation, pleural effusion, or pneumothorax. The cardiac silhouette is within normal limits. Atherosclerotic calcification of the aorta. No acute osseous pathology.   IMPRESSION: 1. No active cardiopulmonary disease. 2. Emphysema.     Electronically Signed   By: Elgie Collard M.D.   On: 02/02/2024 11:03 ----------   Please schedule a Follow-up Appointment to: Return if symptoms worsen or fail to improve.  If you have any other questions or concerns, please feel free to call the office or send a message through MyChart. You may also schedule an earlier appointment if necessary.  Additionally, you may be receiving a survey about your experience at our office within a few days to 1 week by e-mail or mail. We value your feedback.  [name], DO [facility], [place]

## 2024-02-02 NOTE — Telephone Encounter (Signed)
  Chief Complaint: pain with breathing in- R side  Symptoms: patient has had prolonged respiratory illness that seems to be progressing- she had imaging of lungs that has not returned and she is concerned about pneumonia.  Frequency: worse last 2 days Pertinent Negatives: Patient denies dizziness, nausea, vomiting, sweating, fever  Disposition: [] ED /[] Urgent Care (no appt availability in office) / [x] Appointment(In office/virtual)/ []  [facility]/ [] Home Care/ [] Refused Recommended Disposition /[] [facility]/ []  Follow-up with PCP Additional Notes: Patient has been scheduled for evaluation of worsening symptoms   Copied from CRM [id number]. Topic: Clinical - Red Word Triage >> Feb 02, 2024  9:19 AM Franchot Heidelberg wrote: Red Word that prompted transfer to Nurse Triage: Chest pain  Reason for Disposition  Taking a deep breath makes pain worse  Answer Assessment - Initial Assessment Questions 1. LOCATION: "Where does it hurt?"       Pain in chest- dull pain, under the breast area- R side 2. RADIATION: "Does the pain go anywhere else?" (e.g., into neck, jaw, arms, back)     No radiation 3. ONSET: "When did the chest pain begin?" (Minutes, hours or days)      2 days 4. PATTERN: "Does the pain come and go, or has it been constant since it started?"  "Does it get worse with exertion?"      Feels more with movement 5. DURATION: "How long does it last" (e.g., seconds, minutes, hours)     Lasting all morning today 6. SEVERITY: "How bad is the pain?"  (e.g., Scale 1-10; mild, moderate, or severe)    - MILD (1-3): doesn't interfere with normal activities     - MODERATE (4-7): interferes with normal activities or awakens from sleep    - SEVERE (8-10): excruciating pain, unable to do any normal activities       Still- not bad- 4-5/10 7. CARDIAC RISK FACTORS: "Do you have any history of heart problems or risk factors for heart disease?" (e.g., angina, prior heart attack; diabetes,  high blood pressure, high cholesterol, smoker, or strong family history of heart disease)     High cholesterol 8. PULMONARY RISK FACTORS: "Do you have any history of lung disease?"  (e.g., blood clots in lung, asthma, emphysema, birth control pills)     Emphysema- sat 92% 9. CAUSE: "What do you think is causing the chest pain?"     Patient is afraid she has pneumonia 10. OTHER SYMPTOMS: "Do you have any other symptoms?" (e.g., dizziness, nausea, vomiting, sweating, fever, difficulty breathing, cough)       SOB- COPD  Protocols used: Chest Pain-A-AH

## 2024-02-02 NOTE — Progress Notes (Signed)
 Subjective:    Patient ID: Brandi [name], female    DOB: 1956/10/15, 68 y.o.   MRN: [id number]  Brandi [name] is a 68 y.o. female presenting on 02/02/2024 for COPD and Pleurisy  Patient presents for a same day appointment.   HPI  Discussed the use of AI scribe software for clinical note transcription with the patient, who gave verbal consent to proceed.  History of Present Illness   The patient presents with chest pain and difficulty breathing.  She has been experiencing chest pain and difficulty breathing, particularly on cold days, which she describes as 'hard breathing'. Despite a recent course of antibiotics and steroids prescribed two weeks ago, her symptoms persist. She has a history of pneumonia, with the last episode occurring in March or April of the previous year, confirmed by a chest x-ray at that time. The 01/31/24 recent x-ray did not show pneumonia but did indicate emphysema with lung expansion.  She has been diagnosed with severe emphysema, confirmed by pulmonary function tests and breathing tests, with the most recent test conducted in June 2024. She has been on multiple courses of antibiotics, totaling eight in the past year, but reports limited improvement in his symptoms. She has also been prescribed inhalers and takes Mucinex to manage mucus production, which she describes as green and yellow in color.  She experiences sharp chest pains, which he associates with inflammation of the lung lining, or pleurisy. She has not been taking anti-inflammatory medications regularly but has used them as needed for pain relief. She is frustrated with the ongoing symptoms and the lack of improvement despite treatment.  She has been dealing with these symptoms for over a year and expresses a sense of urgency in finding a resolution. She mentions difficulty with physical activities, such as walking to her car, due to shortness of breath. She has been approved for overnight oxygen use but  not for portable oxygen, which she finds challenging to manage.          09/09/2023    1:33 PM 03/26/2023   10:03 AM 03/03/2023    3:24 PM  Depression screen PHQ 2/9  Decreased Interest 0 0 1  Down, Depressed, Hopeless 0 0 0  PHQ - 2 Score 0 0 1  Altered sleeping 0 0   Tired, decreased energy 0 0   Change in appetite 0 0   Feeling bad or failure about yourself  0 0   Trouble concentrating 0 0   Moving slowly or fidgety/restless 0 0   Suicidal thoughts 0 0   PHQ-9 Score 0 0   Difficult doing work/chores Not difficult at all Not difficult at all        03/26/2023   10:03 AM 02/12/2022   10:42 AM 08/12/2021   11:07 AM 05/06/2021   11:08 AM  GAD 7 : Generalized Anxiety Score  Nervous, Anxious, on Edge 0 0 0 0  Control/stop worrying 0 0 0 0  Worry too much - different things 0 0 0 0  Trouble relaxing 0 0 0 0  Restless 0 0 0 0  Easily annoyed or irritable 0 0 0 0  Afraid - awful might happen 0 0 0 0  Total GAD 7 Score 0 0 0 0  Anxiety Difficulty Not difficult at all Not difficult at all Not difficult at all Not difficult at all    Social History   Tobacco Use   Smoking status: Former    Current packs/day: 0.00  Average packs/day: 0.8 packs/day for 41.0 years (30.8 ttl pk-yrs)    Types: Cigarettes    Start date: 12/07/1974    Quit date: 12/08/2015    Years since quitting: 8.1   Smokeless tobacco: Never  Vaping Use   Vaping status: Never Used  Substance Use Topics   Alcohol use: No   Drug use: No    Review of Systems Per HPI unless specifically indicated above     Objective:    BP 122/60   Pulse 100   Ht 5\' 4"  (1.626 m)   Wt 123 lb (55.8 kg)   SpO2 93%   BMI 21.11 kg/m   Wt Readings from Last 3 Encounters:  02/02/24 123 lb (55.8 kg)  01/14/24 124 lb 9.6 oz (56.5 kg)  10/27/23 126 lb 3.2 oz (57.2 kg)    Physical Exam Vitals and nursing note reviewed.  Constitutional:      General: She is not in acute distress.    Appearance: She is well-developed. She is  not diaphoretic.     Comments: Well-appearing, comfortable, cooperative  HENT:     Head: Normocephalic and atraumatic.  Eyes:     General:        Right eye: No discharge.        Left eye: No discharge.     Conjunctiva/sclera: Conjunctivae normal.  Neck:     Thyroid: No thyromegaly.  Cardiovascular:     Rate and Rhythm: Normal rate and regular rhythm.     Heart sounds: Normal heart sounds. No murmur heard. Pulmonary:     Effort: Pulmonary effort is normal. No respiratory distress.     Breath sounds: No wheezing or rales.     Comments: Reduced air movement diffusely at baseline Musculoskeletal:        General: Normal range of motion.     Cervical back: Normal range of motion and neck supple.  Lymphadenopathy:     Cervical: No cervical adenopathy.  Skin:    General: Skin is warm and dry.     Findings: No erythema or rash.  Neurological:     Mental Status: She is alert and oriented to person, place, and time.  Psychiatric:        Behavior: Behavior normal.     Comments: Well groomed, good eye contact, normal speech and thoughts     I have personally reviewed the radiology report from 01/31/24 on CXR .  CLINICAL DATA:  Acute respiratory illness. Shortness of breath. History of emphysema.   EXAM: CHEST - 2 VIEW   COMPARISON:  Chest radiograph dated 09/14/2023.   FINDINGS: Background of emphysema. No focal consolidation, pleural effusion, or pneumothorax. The cardiac silhouette is within normal limits. Atherosclerotic calcification of the aorta. No acute osseous pathology.   IMPRESSION: 1. No active cardiopulmonary disease. 2. Emphysema.     Electronically Signed   By: Elgie Collard M.D.   On: 02/02/2024 11:03  Results for orders placed or performed in visit on 09/16/23  Cytology - PAP   Collection Time: 09/16/23  2:34 PM  Result Value Ref Range   High risk HPV Negative    Adequacy      Satisfactory for evaluation; transformation zone component PRESENT.    Diagnosis      - Negative for intraepithelial lesion or malignancy (NILM)   Comment Normal Reference Range HPV - Negative       Assessment & Plan:   Problem List Items Addressed This Visit     Centrilobular emphysema (  HCC) - Primary   Other Visit Diagnoses       Pleurisy       Relevant Medications   naproxen (NAPROSYN) 500 MG tablet     Pleuritic pain       Relevant Medications   naproxen (NAPROSYN) 500 MG tablet        Chronic Obstructive Pulmonary Disease (COPD) with suspected Pleurisy Persistent chest wall sharp pain and shortness of breath despite recent antibiotic and steroid treatment. Chest X-ray 01/31/24 shows emphysema but no focal pneumonia.  Patient is frustrated with perceived lack of progress.  Followed by [facility] Pulmonology. Note she has overnight oxygen but did not qualify for portable O2, she is in progress with Pulm follow-up to discuss arranging Portable O2 testing / ordering if eligible.  Lungs clear today, without wheezing. Some cough at baseline. Spoke with Radiologist today during visit to get the reading on the Chest X-ray from 2 days ago, see report.  Note she has a bottle of Azithromycin from [facility] that she has not started. I advised, unsure if this would solve anything right now. I am not convinced more antibiotic is not the correct answer.  Unfortunately she is on optimal therapy Breztri and nebs and not improving. I advised her that today I would suggest treating some of her pleuritic pains that she experiences, maybe from flares and coughing could be pleurisy  -Recommend anti-inflammatory treatment Naproxen 500mg  TWICE A DAY 1-2 weeks then AS NEEDED -Encourage patient to discuss further testing and management options with pulmonologist at upcoming appointment in two days.  -Encourage patient to discuss the need for portable oxygen therapy with pulmonologist at upcoming appointment.  Also discussed her questions about if allergies can be  related. We discussed constellation of allergy symptoms can contribute to difficulty breathing and can flare COPD at times perhaps but it does not seem consistent with history that current symptoms are due to allergies. But if she wants to pursue allergy testing we can refer to Allergist or ENT for further testing.  Ultimately, she provides frustrations about lack of improvement, and I advised to keep with current Pulmonology specialist and their management for now. If in future patient requests a 2nd opinion from other Pulmonology group, I would assist with referral.  Route chart with comments to her Pulmonologist Dr Basilia Jumbo.  No orders of the defined types were placed in this encounter.   Meds ordered this encounter  Medications   naproxen (NAPROSYN) 500 MG tablet    Sig: Take 1 tablet (500 mg total) by mouth 2 (two) times daily with a meal. For 1-2 weeks then as needed    Dispense:  60 tablet    Refill:  2    Follow up plan: Return if symptoms worsen or fail to improve.   [name], DO [facility] 02/02/2024, 10:49 AM

## 2024-02-04 ENCOUNTER — Ambulatory Visit: Payer: No Typology Code available for payment source | Admitting: Pulmonary Disease

## 2024-02-04 ENCOUNTER — Encounter: Payer: Self-pay | Admitting: Pulmonary Disease

## 2024-02-04 ENCOUNTER — Other Ambulatory Visit
Admission: RE | Admit: 2024-02-04 | Discharge: 2024-02-04 | Disposition: A | Discharge status: Home / Self Care | Payer: No Typology Code available for payment source | Source: Ambulatory Visit | Attending: Pulmonary Disease | Admitting: Pulmonary Disease

## 2024-02-04 ENCOUNTER — Other Ambulatory Visit: Payer: Self-pay | Admitting: Family Medicine

## 2024-02-04 VITALS — BP 120/70 | HR 97 | Temp 97.3°F | Ht 64.0 in | Wt 124.0 lb

## 2024-02-04 DIAGNOSIS — R0602 Shortness of breath: Secondary | ICD-10-CM | POA: Insufficient documentation

## 2024-02-04 DIAGNOSIS — R0789 Other chest pain: Secondary | ICD-10-CM | POA: Insufficient documentation

## 2024-02-04 DIAGNOSIS — J432 Centrilobular emphysema: Secondary | ICD-10-CM

## 2024-02-04 DIAGNOSIS — J449 Chronic obstructive pulmonary disease, unspecified: Secondary | ICD-10-CM | POA: Insufficient documentation

## 2024-02-04 DIAGNOSIS — J9611 Chronic respiratory failure with hypoxia: Secondary | ICD-10-CM | POA: Diagnosis not present

## 2024-02-04 DIAGNOSIS — R091 Pleurisy: Secondary | ICD-10-CM | POA: Diagnosis not present

## 2024-02-04 DIAGNOSIS — Z008 Encounter for other general examination: Secondary | ICD-10-CM | POA: Diagnosis not present

## 2024-02-04 LAB — CBC WITH DIFFERENTIAL/PLATELET
Abs Immature Granulocytes: 0.07 10*3/uL (ref 0.00–0.07)
Basophils Absolute: 0.1 10*3/uL (ref 0.0–0.1)
Basophils Relative: 1 %
Eosinophils Absolute: 0.5 10*3/uL (ref 0.0–0.5)
Eosinophils Relative: 6 %
HCT: 39.9 % (ref 36.0–46.0)
Hemoglobin: 13.2 g/dL (ref 12.0–15.0)
Immature Granulocytes: 1 %
Lymphocytes Relative: 20 %
Lymphs Abs: 1.8 10*3/uL (ref 0.7–4.0)
MCH: 32.4 pg (ref 26.0–34.0)
MCHC: 33.1 g/dL (ref 30.0–36.0)
MCV: 98 fL (ref 80.0–100.0)
Monocytes Absolute: 0.9 10*3/uL (ref 0.1–1.0)
Monocytes Relative: 11 %
Neutro Abs: 5.6 10*3/uL (ref 1.7–7.7)
Neutrophils Relative %: 61 %
Platelets: 297 10*3/uL (ref 150–400)
RBC: 4.07 MIL/uL (ref 3.87–5.11)
RDW: 12.2 % (ref 11.5–15.5)
WBC: 8.9 10*3/uL (ref 4.0–10.5)
nRBC: 0 % (ref 0.0–0.2)

## 2024-02-04 LAB — COMPREHENSIVE METABOLIC PANEL
ALT: 12 U/L (ref 0–44)
AST: 16 U/L (ref 15–41)
Albumin: 4.1 g/dL (ref 3.5–5.0)
Alkaline Phosphatase: 36 U/L — ABNORMAL LOW (ref 38–126)
Anion gap: 9 (ref 5–15)
BUN: 9 mg/dL (ref 8–23)
CO2: 26 mmol/L (ref 22–32)
Calcium: 8.7 mg/dL — ABNORMAL LOW (ref 8.9–10.3)
Chloride: 103 mmol/L (ref 98–111)
Creatinine, Ser: 0.46 mg/dL (ref 0.44–1.00)
GFR, Estimated: >60 mL/min (ref >60)
Glucose, Bld: 92 mg/dL (ref 70–99)
Potassium: 3.4 mmol/L — ABNORMAL LOW (ref 3.5–5.1)
Sodium: 138 mmol/L (ref 135–145)
Total Bilirubin: 0.8 mg/dL (ref 0.0–1.2)
Total Protein: 6.4 g/dL — ABNORMAL LOW (ref 6.5–8.1)

## 2024-02-04 LAB — D-DIMER, QUANTITATIVE: D-Dimer, Quant: <0.27 ug{FEU}/mL (ref 0.00–0.50)

## 2024-02-04 MED ORDER — DOXYCYCLINE HYCLATE 100 MG PO TABS: 100.0000 mg | ORAL_TABLET | Freq: Two times a day (BID) | ORAL | 0 refills | Status: AC

## 2024-02-04 MED ORDER — ALBUTEROL SULFATE (2.5 MG/3ML) 0.083% IN NEBU: 2.5000 mg | INHALATION_SOLUTION | Freq: Four times a day (QID) | RESPIRATORY_TRACT | 6 refills | Status: DC | PRN

## 2024-02-04 MED ORDER — PREDNISONE 10 MG (21) PO TBPK: ORAL_TABLET | ORAL | 0 refills | Status: DC

## 2024-02-04 NOTE — Addendum Note (Signed)
 Addended by: [name] on: 02/04/2024 02:31 PM   Modules accepted: Orders

## 2024-02-04 NOTE — Progress Notes (Addendum)
 Subjective:    Patient ID: Brandi [name], female    DOB: Apr 19, 1956, 68 y.o.   MRN: [id number]  Patient Care Team: [name], DO as PCP - General (Family Medicine) [name], MD as Consulting Physician (Pulmonary Disease) Marlowe Sax, RN as Case Manager (General Practice)  Chief Complaint  Patient presents with   Follow-up    DOE. Wheezing. Cough with yellow sputum.     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  Last evaluated here on 27 October 2023.   HPI Discussed the use of AI scribe software for clinical note transcription with the patient, who gave verbal consent to proceed.  History of Present Illness   Brandi [name] is a 68 year old female with severe COPD who presents with worsening respiratory symptoms.  She was brought in by a friend however, the patient was alone during the visit portion of the appointment.  She is experiencing worsening respiratory symptoms, mainly shortness of breath, a recent drop in oxygen levels taken at home and productive cough with green and yellow sputum. She last took prednisone a week ago and is currently out of albuterol. Her lung function tests from June show a 42% lung function, with emphysema being the primary issue.  Patient inquires as to whether sinus-related issues are contributing to her symptoms, this is however doubtful.  She has had prior ENT evaluation  She uses Breztri and albuterol for her COPD management. She discontinued Ohtuvayre after consulting with an insurance doctor, who switched her to [facility] despite the fact that she is on Breztri (duplication of antimuscarinic medications). She has not taken [facility] for the last three days.  She prefers her albuterol nebulizer solution for rescue and feels that the DuoNeb makes her too jittery.  She has a history of a mild depression of the  superior endplate of the mid thoracic vertebral body and generative changes of the T-spine this may add to her perceived chest pain. She experiences chest pain, which she initially thought was a heart attack, and takes Prilosec for reflux.  She has been evaluated for this by her primary physician and has been treated for potential pleurisy.  She has been prescribed Naprosyn for pain but has not yet taken it.  She has not had any fevers, chills or sweats.  Her chest pain is located to in the anterior aspect of her chest.  She inquires about the possibility of allergies causing her symptoms, but previous testing showed only minor reactions to dust mites and [place] grass. She has undergone recent scans and blood work, which did not reveal significant findings.  She lives alone and expresses difficulty in attending appointments due to transportation issues, especially during inclement weather. She has not considered assisted living and manages her condition independently.   During the visit she keeps stating that no one has ever told her how severe her COPD is.  This has been discussed with her at length on prior visits.  This lack of recollection is worrisome for perhaps developing of a mild cognitive impairment.    DATA 04/09/2021 LDCT: Moderate to severe paraseptal and centrilobular emphysema.  Left upper lobe nodule measuring 2 mm (decreased from prior), compression fractures T7 and T9 noted previously and unchanged.  Coronary artery calcifications. 07/04/2021 alpha-1 antitrypsin: Phenotype MM, level 140 mg/dL (normal) 82/95/6213 overnight oximetry: Baseline O2 sat 90% during sleep with desaturations as  low as 83% during the night desaturation events lasted for over an hour. 08/14/2021 PFTs: FEV1 1.13 L or 46% predicted, FVC 2.42 L or 76% predicted, FEV1/FVC 47%, there is a mild response to bronchodilator with a 10% net change.  There is hyperinflation with TLC at 128% and air trapping with RV at 188%.   Diffusion capacity is moderately to severely reduced by Kco.  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 08/14/2021 echocardiogram: LVEF 60 to 65%, grade 1 DD 12/04/2021 chest x-ray: Emphysema with no evidence of acute cardiopulmonary disease 12/24/2021 overnight oximetry: Baseline O2 sat 90% desaturations as low as 83%.   12/26/2021 nuclear medicine stress test: No significant ischemia, normal wall motion, EF estimated at 77%, coronary calcification in the LAD, mild aortic atherosclerosis 01/05/2023 chest x-ray PA and lateral: Infiltrates in the right middle lobe favor to be infectious/inflammatory 02/23/2023 chest x-ray PA and lateral: Improved though incompletely resolved middle lobe infection, minimal residual, emphysema 05/25/2023 CT chest without contrast: No acute cardiopulmonary disease, moderate to severe centrilobular emphysematous disease without significant change.  Borderline cardiomegaly unchanged.  Atherosclerotic coronary artery disease.  Stable mild superior endplate compression deformity mid thoracic spine. 05/25/2023 PFTs: FEV1 1.0 L or 42% predicted, FVC 2.44 L or 78% predicted, FEV1/FVC 41%, there is a reversible component with FEV1 improvement by 17% after bronchodilator.  There is hyperinflation and air trapping.  Diffusion capacity severely impaired, this is consistent with emphysema and reversible airways component.  Lung function has shown modest decrease from prior. 01/31/2024 CXR PA and lateral: Significant COPD/emphysema changes, no active cardiopulmonary disease.  Flattening of the diaphragms noted in keeping with severe COPD.   Review of Systems A 10 point review of systems was performed and it is as noted above otherwise negative.   Patient Active Problem List   Diagnosis Date Noted   Osteoporosis 11/29/2023   Rash and nonspecific skin eruption 05/22/2023   Facial tingling sensation 05/22/2023   Pre-diabetes 04/08/2021   Vitamin D deficiency 04/08/2021    COVID-19 vaccination declined 12/04/2020   Centrilobular emphysema (HCC) 04/08/2020   Nodule of upper lobe of left lung 04/08/2020   Aortic atherosclerosis (HCC) 05/21/2019   Coronary artery calcification seen on CT scan 05/21/2019   Former smoker 05/21/2019   Hyperlipidemia 05/21/2019    Social History   Tobacco Use   Smoking status: Former    Current packs/day: 0.00    Average packs/day: 0.8 packs/day for 41.0 years (30.8 ttl pk-yrs)    Types: Cigarettes    Start date: 12/07/1974    Quit date: 12/08/2015    Years since quitting: 8.1   Smokeless tobacco: Never  Substance Use Topics   Alcohol use: No    No Known Allergies  Current Meds  Medication Sig   albuterol (PROVENTIL) (2.5 MG/3ML) 0.083% nebulizer solution USE 1 VIAL VIA NEBULIZER EVERY 6 HOURS AS NEEDED FOR WHEEZING OR SHORTNESS OF BREATH   albuterol (VENTOLIN HFA) 108 (90 Base) MCG/ACT inhaler Inhale 2 puffs into the lungs every 4 (four) hours as needed for wheezing or shortness of breath.   alendronate (FOSAMAX) 70 MG tablet Take 1 tablet (70 mg total) by mouth once a week.   aspirin EC 81 MG tablet Take 1 tablet (81 mg total) by mouth daily.   Budeson-Glycopyrrol-Formoterol (BREZTRI AEROSPHERE) 160-9-4.8 MCG/ACT AERO Inhale 2 puffs into the lungs in the morning and at bedtime.   busPIRone (BUSPAR) 5 MG tablet TAKE 1 TABLET BY MOUTH TWICE DAILY AS NEEDED FOR ANXIETY  Calcium Carbonate-Vit D-Min (CALCIUM 1200) 1200-1000 MG-UNIT CHEW Chew 1,200 mg by mouth daily.   Cholecalciferol (VITAMIN D3) 125 MCG (5000 UT) TABS Take by mouth. Vitamin D3 5,000 IU daily for 12 weeks then reduce to OTC Vitamin D3 2,000 IU daily for maintenance   ezetimibe (ZETIA) 10 MG tablet Take 1 tablet (10 mg total) by mouth daily.   famotidine (PEPCID) 20 MG tablet Take 1 tablet (20 mg total) by mouth 2 (two) times daily as needed.   feeding supplement (ENSURE ENLIVE / ENSURE PLUS) LIQD Take 237 mLs by mouth 2 (two) times daily between meals.    ipratropium-albuterol (DUONEB) 0.5-2.5 (3) MG/3ML SOLN Inhale 3 mLs into the lungs every 6 (six) hours as needed.   montelukast (SINGULAIR) 10 MG tablet TAKE 1 TABLET BY MOUTH AT BEDTIME   Multiple Vitamin (MULTIVITAMIN WITH MINERALS) TABS tablet Take 1 tablet by mouth daily.   naproxen (NAPROSYN) 500 MG tablet Take 1 tablet (500 mg total) by mouth 2 (two) times daily with a meal. For 1-2 weeks then as needed   omeprazole (PRILOSEC) 20 MG capsule Take 1 capsule (20 mg total) by mouth 2 (two) times daily before a meal.   OXYGEN Inhale 3 L into the lungs at bedtime as needed.   rosuvastatin (CRESTOR) 10 MG tablet Take 1 tablet (10 mg total) by mouth daily.   sodium chloride (OCEAN) 0.65 % SOLN nasal spray Place 1 spray into both nostrils as needed for congestion.   Specialty Vitamins Products (HAIR NOURISHING SUPPLEMENT PO) Take 1 tablet by mouth daily.   tiZANidine (ZANAFLEX) 2 MG tablet Take 1 tablet (2 mg total) by mouth every 8 (eight) hours as needed for muscle spasms.   triamcinolone cream (KENALOG) 0.5 % Apply 1 Application topically 2 (two) times daily as needed (allergic reaction on skin swelling). To affected areas, for up to 2 weeks.    Immunization History  Administered Date(s) Administered   [employer] Vaccination 05/26/2021, 06/24/2021        Objective:     BP 120/70 (BP Location: Right Arm, Cuff Size: Normal)   Pulse 97   Temp (!) 97.3 F (36.3 C)   Ht 5\' 4"  (1.626 m)   Wt 124 lb (56.2 kg) Comment: per patient. in a wheelchair today  SpO2 93%   BMI 21.28 kg/m   SpO2: 93 % O2 Device: None (Room air)  GENERAL: Well-developed, well-nourished woman, no acute distress.  Presents in transport chair due to dyspnea.  Uncomfortable but no tachypnea, no conversational dyspnea.  Using a friend's POC. HEAD: Normocephalic, atraumatic.  EYES: Pupils equal, round, reactive to light.  No scleral icterus.  MOUTH: Poor dentition. She does have visible nasal septal  deviation.  Oral mucosa moist, no thrush. NECK: Supple. No thyromegaly. Trachea midline. No JVD.  No adenopathy. CHEST: Very tender costochondral junction particularly on the left PULMONARY: Distant breath sounds, fair air movement. Coarse, otherwise no adventitious sounds. CARDIOVASCULAR: S1 and S2. Regular rate and rhythm.  No rubs, murmurs or gallops heard. ABDOMEN: Benign. MUSCULOSKELETAL: No joint deformity, no clubbing, no edema.  NEUROLOGIC: No focal deficit, speech is fluent.  Unsteady gait due to generalized weakness. SKIN: Intact,warm,dry. PSYCH: Mood and behavior normal..  Ambulatory oxymetry was performed today:  At rest on room air oxygen saturation was 94%, the patient ambulated at a slow pace, completed 2 laps, O2 nadir 96%, severe shortness of breath.  Resting heart rate was 101 bpm at maximum for this exercise 97 bpm. Patient was  placed on oxygen delivered via pulsed conserving device, required 4 L/min to maintain 92% O2 sat.   Chest x-ray performed 01/31/2024, independently reviewed, consistent with severe changes of COPD, no acute process otherwise:    Assessment & Plan:     ICD-10-CM   1. Stage 3 severe COPD by GOLD classification (HCC)  J44.9 AMB REFERRAL FOR DME    Alpha-1 antitrypsin phenotype    DG Chest 2 View    2. Shortness of breath  R06.02 DG Chest 2 View    Comp Met (CMET)    CBC with Differential/Platelet    D-Dimer, Quantitative    3. Chronic respiratory failure with hypoxia (HCC)  J96.11 AMB REFERRAL FOR DME    CBC with Differential/Platelet    4. Other chest pain  R07.89 Comp Met (CMET)    CBC with Differential/Platelet    D-Dimer, Quantitative     Orders Placed This Encounter  Procedures   Alpha-1 antitrypsin phenotype    Standing Status:   Future    Number of Occurrences:   1    Expiration Date:   02/03/2025   Comp Met (CMET)    Standing Status:   Future    Number of Occurrences:   1    Expiration Date:   02/03/2025   CBC with  Differential/Platelet    Standing Status:   Future    Number of Occurrences:   1    Expected Date:   02/04/2024    Expiration Date:   02/03/2025   D-Dimer, Quantitative    Standing Status:   Future    Number of Occurrences:   1    Expiration Date:   02/03/2025   AMB REFERRAL FOR DME    Referral Priority:   Routine    Referral Type:   Durable Medical Equipment Purchase    Number of Visits Requested:   1   Meds ordered this encounter  Medications   albuterol (PROVENTIL) (2.5 MG/3ML) 0.083% nebulizer solution    Sig: Take 3 mLs (2.5 mg total) by nebulization every 6 (six) hours as needed for wheezing or shortness of breath.    Dispense:  75 mL    Refill:  6   doxycycline (VIBRA-TABS) 100 MG tablet    Sig: Take 1 tablet (100 mg total) by mouth 2 (two) times daily for 7 days.    Dispense:  14 tablet    Refill:  0   predniSONE (STERAPRED UNI-PAK 21 TAB) 10 MG (21) TBPK tablet    Sig: Take as directed in the package.    Dispense:  21 tablet    Refill:  0   Discussion:    Severe COPD with Emphysema/Exercise Hypoxemia Severe COPD with emphysema, characterized by lung FEV1 at 42% dictated and significant emphysematous changes on imaging. Increased sputum production with green and yellow coloration suggests infection. Current inhalers include Breztri and albuterol. Reintroduction of Ohtuvayre is needed for better inflammation management. Pulmonary rehabilitation is recommended to improve breathing and overall function. Without rehab, her condition may worsen, leading to increased weakness and further decline in lung function.  She has cited inability to come to in person pulmonary rehab, prior referral to [facility] was declined by insurance.  Patient qualified for oxygen supplementation today. - Order oxygen therapy - Prescribe antibiotics for suspected infection (doxycycline 100 mg twice daily x 7 days), prednisone taper pack - Reintroduce Ohtuvayre twice daily - Continue Breztri - Send albuterol  prescription to pharmacy for as-needed use - Recommend pulmonary rehabilitation  three times a week - Order chest x-ray - Order blood work  Mild Depression of the Superior Endplate of the Mid Thoracic Vertebral Body Mild compression fracture/depression in the mid-spine, likely contributing to pain. The exact timing of the fracture/depression is unclear. Further evaluation may be required if symptoms persist. - Monitor pain and consider further imaging if symptoms persist  Costochondritis Inflammation of the costosternal junction, likely causing chest pain.  She was prescribed Naprosyn by primary MD, recommend to take as prescribed. Condition is treated with anti-inflammatory medications and monitoring for improvement. - Continue Naprosyn as prescribed - Monitor for improvement in chest pain  General Health Maintenance Discussion about the need for home health services and potential for assisted living due to her living situation and difficulty attending appointments. Advised to discuss home health services with her primary care physician and consider assisted living options. - Discuss home health services with primary care physician - Consider assisted living options - Consider neuropsychiatric evaluation (there is family history of Alzheimer's in the mother)  Follow-up - Follow up in 4-6 weeks.   Discussed in detail with Dr. [name] via phone conversation.    Advised if symptoms do not improve or worsen, to please contact office for sooner follow up or seek emergency care.    I spent 62 minutes of dedicated to the care of this patient on the date of this encounter to include pre-visit review of records, face-to-face time with the patient discussing conditions above, post visit ordering of testing, clinical documentation with the electronic health record, making appropriate referrals as documented, and communicating necessary findings to members of the patients care  team.     C. Danice Goltz, MD Advanced Bronchoscopy PCCM [facility]    *This note was generated using voice recognition software/Dragon and/or AI transcription program.  Despite best efforts to proofread, errors can occur which can change the meaning. Any transcriptional errors that result from this process are unintentional and may not be fully corrected at the time of dictation.

## 2024-02-04 NOTE — Patient Instructions (Signed)
 VISIT SUMMARY:  Today, we discussed your worsening respiratory symptoms related to your severe COPD and emphysema. We also addressed your chest pain, which is likely due to costochondritis, and your mid-spine compression fracture. Additionally, we talked about your living situation and the potential need for home health services or assisted living.  YOUR PLAN:  -SEVERE COPD WITH EMPHYSEMA: Chronic Obstructive Pulmonary Disease (COPD) with emphysema is a lung condition that makes it hard to breathe and is characterized by damaged air sacs in the lungs. We will reintroduce to where twice daily, continue Breztri, and send an albuterol prescription to the pharmacy for as-needed use. You will also start oxygen therapy and take antibiotics for the suspected infection. Pulmonary rehabilitation is recommended three times a week to improve your breathing and overall function. A chest x-ray and blood work have been ordered to monitor your condition.  -COMPRESSION FRACTURE: A compression fracture is a break in one of your vertebrae, which can cause significant pain. We will monitor your pain and consider further imaging if your symptoms persist.  I am not sure if this is what is causing your pain however it could aggravate any other conditions in your chest.  -COSTOCHONDRITIS: Costochondritis is inflammation of the cartilage that connects a rib to the breastbone, causing chest pain. You should continue taking Naprosyn as prescribed and monitor for improvement in your chest pain.  -GENERAL HEALTH MAINTENANCE: We discussed the need for home health services and the potential for assisted living due to your difficulty attending appointments. Please discuss home health services with your primary care physician and consider assisted living options.  INSTRUCTIONS:  Please follow up in 4-6 weeks. Make sure to complete the chest x-ray and blood work as ordered. Start your oxygen therapy, take your antibiotics as  prescribed, let us know if you want [place] to refer you to pulmonary rehabilitation.  Alternatively, you could check with your insurance to see if they would pay for pulmonary rehab at home.  Previously, they have denied this service.  Continue taking Naprosyn for your chest pain and monitor for any changes. Discuss home health services and assisted living options with your primary care physician.

## 2024-02-04 NOTE — Progress Notes (Signed)
 Lm x1 for the patient.

## 2024-02-06 LAB — MISC LABCORP TEST (SEND OUT): LabCorp test name: 95653

## 2024-02-07 LAB — ALPHA-1-ANTITRYPSIN PHENOTYP: A-1 Antitrypsin, Ser: 167 mg/dL (ref 101–187)

## 2024-02-08 DIAGNOSIS — Z7189 Other specified counseling: Secondary | ICD-10-CM | POA: Diagnosis not present

## 2024-02-08 DIAGNOSIS — Z008 Encounter for other general examination: Secondary | ICD-10-CM | POA: Diagnosis not present

## 2024-02-09 ENCOUNTER — Telehealth: Payer: Self-pay

## 2024-02-09 ENCOUNTER — Telehealth: Payer: Self-pay | Admitting: *Deleted

## 2024-02-09 DIAGNOSIS — J449 Chronic obstructive pulmonary disease, unspecified: Secondary | ICD-10-CM

## 2024-02-09 NOTE — Telephone Encounter (Signed)
 Please notify ptaient that referral has been submitted to [facility].  She can anticipate a call / appointment from them next. I am unsure exact timeline on their new patient scheduling process.  If she has not heard from anyone within next 2 weeks, she can contact us back to check status.  [name], DO [facility] 02/09/2024, 6:22 PM

## 2024-02-09 NOTE — Telephone Encounter (Signed)
 Copied from CRM [id number]. Topic: Referral - Request for Referral >> Feb 09, 2024 12:52 PM Dondra Prader E wrote: Did the patient discuss referral with their provider in the last year? Yes (If No - schedule appointment) (If Yes - send message)  Appointment offered? No  Type of order/referral and detailed reason for visit: Pulmonology   Preference of office, provider, location: Highest recommended, seeking second approval   If referral order, have you been seen by this specialty before? Yes (If Yes, this issue or another issue? When? Where?  Can we respond through MyChart? Yes

## 2024-02-09 NOTE — Progress Notes (Unsigned)
 Complex Care Management Note Care Guide Note  02/09/2024 Name: Brandi [name] MRN: [id number] DOB: Apr 21, 1956   Complex Care Management Outreach Attempts: An unsuccessful telephone outreach was attempted today to offer the patient information about available complex care management services.  Follow Up Plan:  Additional outreach attempts will be made to offer the patient complex care management information and services.   Encounter Outcome:  No Answer  Burman Nieves, CMA, Care Guide [facility]  [facility], [facility] Guide Direct Dial: [phone]  Fax: [phone] Website: [email or web address]

## 2024-02-09 NOTE — Progress Notes (Addendum)
 Complex Care Management Note Care Guide Note  02/09/2024 Name: Brandi [name] MRN: [id number] DOB: 1956-03-20  Nellene Courtois is a 68 y.o. year old female who is a primary care patient of [name], DO . The community resource team was consulted for assistance with Transportation Needs  and Food Insecurity  SDOH screenings and interventions completed:  Yes  Social Drivers of Health From This Encounter   Food Insecurity: No Food Insecurity (02/09/2024)   Hunger Vital Sign    Worried About Running Out of Food in the Last Year: Never true    Ran Out of Food in the Last Year: Never true  Financial Resource Strain: Low Risk  (02/09/2024)   Overall Financial Resource Strain (CARDIA)    Difficulty of Paying Living Expenses: Not very hard  Transportation Needs: No Transportation Needs (02/09/2024)   PRAPARE - Administrator, Civil Service (Medical): No    Lack of Transportation (Non-Medical): No  Utilities: Not At Risk (02/09/2024)   Utilities    Threatened with loss of utilities: No    SDOH Interventions Today    Flowsheet Row Most Recent Value  SDOH Interventions   Food Insecurity Interventions Other (Comment)  [Patient requested a food stamp application be mailed to her home address. She is not interested in a food pantry list at this time.]  Transportation Interventions Other (Comment)  [Spoke with patient she does not need transportation now but would like to have another option when needed. I left a message for Marcelino Duster at [place] to return my call and will follow-up with her tomorrow.]        Care guide performed the following interventions: Spoke with patient she does not need transportation now but would like to have another option when needed. I left a message for Marcelino Duster at [place] to return my call and will follow-up with her tomorrow.  Patient requested a Food Stamp application be mailed to her home address. She is not interested in receiving a food pantry list.  Patient is not interested in receiving Meals on Wheels.  Follow Up Plan:  I left a message for Marcelino Duster at [place] to return my call and will follow-up with her tomorrow.  Encounter Outcome:  Patient Visit Completed  [facility]  [facility] Guide Direct Dial: [phone]  Fax: [phone] Website: [email or web address]

## 2024-02-09 NOTE — Addendum Note (Signed)
 Addended by: [name] on: 02/09/2024 04:52 PM   Modules accepted: Orders

## 2024-02-10 ENCOUNTER — Telehealth: Payer: Self-pay

## 2024-02-10 NOTE — Progress Notes (Signed)
 Complex Care Management Note  Care Guide Note 02/10/2024 Name: Brandi [name] MRN: [id number] DOB: [birth year]  Brandi [name] is a 68 y.o. year old female who sees [name], DO for primary care. I reached out to [employer] by phone today to offer complex care management services.  [name] was given information about Complex Care Management services today including:   The Complex Care Management services include support from the care team which includes your Nurse Care Manager, Clinical Social Worker, or Pharmacist.  The Complex Care Management team is here to help remove barriers to the health concerns and goals most important to you. Complex Care Management services are voluntary, and the patient may decline or stop services at any time by request to their care team member.   Complex Care Management Consent Status: Patient agreed to services and verbal consent obtained.   Follow up plan:  Telephone appointment with complex care management team member scheduled for:  02/17/2024  Encounter Outcome:  Patient Scheduled  Burman Nieves, CMA, Care Guide [facility]  [facility] Guide Direct Dial: [phone]  Fax: [phone] Website: [email or web address]

## 2024-02-10 NOTE — Progress Notes (Signed)
 Complex Care Management Note Care Guide Note  02/10/2024 Name: Shaheen Star MRN: [id number] DOB: May 30, 1956  Michaelle Bottomley is a 68 y.o. year old female who is a primary care patient of [name], DO . The community resource team was consulted for assistance with Transportation Needs  and Medicaid application.  SDOH screenings and interventions completed:  No     SDOH Interventions Today    Flowsheet Row Most Recent Value  SDOH Interventions   Transportation Interventions Other (Comment)  [Spoke with patient to let her know I spoke with Marcelino Duster at [facility] she is in their system and to expect paperwork in the next few days to complete and mail back.Also the manager from [employer] will come out to inspect the driveway.]        Care guide performed the following interventions: Spoke with patient to let her know I spoke with Marcelino Duster at [facility] she is in their system and to expect paperwork in the next few days to complete and mail back.  Also the manager from [employer] will come out to inspect the driveway. Patient stated she has a circle driveway so the Zenaida Niece should be able to get in and out with no problem. Reminded patient to expect Food Stamp application mailed out today and to complete and return to the address in the letter.                                                                                                                          Received return call from Cale at [facility] she has entered patient's information in their system.  She will have Rodman Pickle send out paperwork to be completed and returned to the office and the manager will go out to the home to make sure they can safely get in an out of the driveway. Patient has my name and contact information.          Follow Up Plan:  No further follow up planned at this time. The patient has been provided with needed resources.  Encounter Outcome:  Patient Visit Completed  [facility]ux[facility] Guide Direct Dial: [phone]  Fax: [phone] Website: [email or web address]

## 2024-02-10 NOTE — Telephone Encounter (Signed)
 Spoke to patient, verbal understanding that referral was placed and they will contact her.

## 2024-02-10 NOTE — Telephone Encounter (Signed)
 Copied from CRM [id number]. Topic: Referral - Request for Referral >> Feb 10, 2024 11:51 AM Patsy Lager T wrote: Did the patient discuss referral with their provider in the last year? Yes  Appointment offered? No  Type of order/referral and detailed reason for visit: Pulmonologist  Preference of office, provider, location: [facility]  If referral order, have you been seen by this specialty before? No (If Yes, this issue or another issue? When? Where?  Can we respond through MyChart? No  Patient she does not want to see any other specialist except going to [facility]

## 2024-02-17 ENCOUNTER — Telehealth: Payer: Self-pay | Admitting: Family Medicine

## 2024-02-17 ENCOUNTER — Other Ambulatory Visit: Payer: Self-pay

## 2024-02-17 NOTE — Telephone Encounter (Signed)
 This telephone note is to document all experiences with this patient that took place today Thursday 02/17/24 by phone, secure chat, and in our office at [facility].  The first documentation here is from the initial point of contact in a secure chat message conversation copied directly from my secure chat messages with Juanell Fairly RN Case Manager (VBCI).  Juanell Fairly RN Hey Dr. Kirtland Bouchard! I just spoke with Mrs. Batty. She wanted you to know that she injured her left arm a few days ago while attempting to retrieve items from a cabinet. She recalls a large can hitting her arm. Initially attempted to treat with ice but notes it is currently swollen and purple from her armpit to her wrist..Marland KitchenNotes significant swelling around her elbow. Reports normal range of motion with some discomfort. She is scheduled to come see you on 02/21/24 but wanted to know if it was possible to complete a virtual visit this week or have imaging ordered..She recalls breaking this arm in the past and concerned that it may be broken again. FM She reports taking fosamax as prescribed.  2:18 PM  [name] DO Thank you for contacting. I am on admin time now so I don't have apts until Friday. We could add her in 4pm Friday virtual, but that is quite late in the day. By time she would go get imaging (not at our office, we don't have X-ray on Fridays), the results will not be back until 3/17 or that week anyway. This sounds more classic for an urgent care walk in case. They deal with fracture and injury evaluation all the time. That is probably the fastest option [address] [place], [place] [address] Phone: [phone] [facility] (walk ins)  2:23 PM Juanell Fairly, RN We discussed going to the ER or [facility] walk-in. I'll let her know about the [facility]. 1 2:29 PM  [name] DO Thank you. I am willing to talk to her at 4pm Friday virtual, but she would have to go over to an  imaging center [facility] or [place], but results would likely not be back until 3/17 anyway 1 2:32 PM -----------------------------------------------------------------------   As documented above, this patient was scheduled for a Patient Outreach Care Management phone call with Juanell Fairly RN today. During the call the patient, Ranya Fiddler, reported her symptoms to [facility] who listened to her concerns and triaged her symptoms with the injury to her left arm. The patient was already scheduled for Monday 02/21/24 with me as an acute visit for Monday morning.   Given the concerning nature of this injury, Felecia contacted me directly on Epic Secure Chat. I gave her some brief advice about our availability in the office. I was contacted after 2pm today on 02/17/24 and I had no patient care hours on Thursday afternoon admin time. There were no other available options for our office until Friday 3/14. I offered her the 4pm acute visit - as patient requested a virtual. However I advised that due to timing of that visit and lack of X-ray on Fridays and result time for X-rays, it would be difficult to get her evaluated and X-ray completed promptly in order to give her results before the weekend. I was concerned with the potential severity of her injury, and advised immediate medical evaluation in person at an Urgent Care or ED, and specifically requested [facility] Walk In Hours available today at the time the recommendation was made. Note -  Felecia had already advised the patient to go to Urgent Care or Emergency Department however the patient refused this triage care advice.  Next Felecia attempted to contact the patient back with additional advice from me including the Orthopedic Urgent Care recommendation. She was unable to reach the patient with my additional advice, as the patient was unavailable by phone and she presented directly to our office to walk in for assistance approximately 3pm or  later this afternoon. I am unsure precise time.  The report from our office staff in the clinic this afternoon was that the patient walked in and requested to be seen and evaluated for her left arm injury. She was advised again that we do not have any appointments this afternoon and I was unavailable to see patients during admin time. She asked for a nurse to evaluate her instead and asked for X-rays. It was explained to her that our medical assistants cannot evaluate and triage her injuries without involving our providers and especially due to the concerning nature of her injury, it was advised again that she needs an actual in person medical evaluation at a clinic that treats walk in patients such as Urgent Care - and she was given a print out information sheet with address and phone number hours for [facility].  The patient left the office, but then came back and was at the door for the nurses station to attempt to come back into the clinical portion of the office, our medical assistants did not re open the door and let her into the clinical exam portion of the office and the patient was again advised that we do not have a provider available to treat her and that the advice from her the previous RN triage and PCP was to go to Orthopedic Urgent Care now. She was upset but she did leave the office at that time.  I communicated the situation to [facility] and she replied with further information about her attempt to follow-up with the patient this afternoon. Note she was unable to reach the patient with my further advice because at that time the patient had arrived to our clinic to attempt to walk in against our advice.  Felecia McCray RN That wouild explain why I couldn't reach her. I gave her the initial advice and helped her with scheduling since she was adamant that you actually see her. She wanted to send images to have someone make recommendations but I explained to her that wouldn't be  appropriate.. that was the reason for me asking about the acute virtual visit. I tried reaching her to update her regarding the option for [facility] but I couldn't reach her.  Following these events, we received a phone call from [employer] insurance attempting to find out more information on how to help her get an appointment and get her issue addressed. Willona requested to speak to our office manager and she spoke on a call with our manager, [employer] and the patient. The situation was again clarified and detailed triage instructions were given that she needs to go to a walk in urgent care clinic such as [facility] for this type of injury as we were unable to get her scheduled or worked in at our office at this time today. The [employer] agent understood the situation and then concluded the call with our office to continue to counsel Haiden on how to seek care at the Urgent Care next.  --------------------------------------------  After debrief with  our office manager and Juanell Fairly RN, the conclusions of our efforts were the following:  The patient understood to seek care at Urgent Care for this injury as we were unable to see her acutely this afternoon. We offered to keep Rasheen's currently scheduled apt for Monday 3/17 to follow-up with her injury and address her issue during her appointment. Our case management team will attempt further outreach to follow-up with patient to ensure she was able to get care for this injury in a timely fashion.  [name], DO [facility] 02/17/2024, 6:44 PM

## 2024-02-17 NOTE — Patient Instructions (Signed)
 Thank you for allowing the Care Management team to participate in your care. It was great speaking with you today!   Reminders: -Please report to the [facility] as advised by your primary care provider to have your left arm evaluated. -I will attempt to reach you on February 18, 2024 to confirm your concerns have been addressed. Please do not hesitate to contact me with questions.   Juanell Fairly [facility] Population Health RN Care Manager Direct Dial: [phone]  Fax: [phone] Website: [email or web address]

## 2024-02-17 NOTE — Patient Outreach (Signed)
 Care Management   Visit Note  02/17/2024 Name: Brandi [name] MRN: [id number] DOB: 10/06/1956  Subjective: Brandi [name] is a 68 y.o. year old female who is a primary care patient of [name], DO. Engaged with Ms. Hineman via telephone.    Assessment:  Review of patient past medical history, allergies, medications, health status, including review of consultants reports, laboratory and other test data, was performed as part of  evaluation and provision of care management services.   Outpatient Encounter Medications as of 02/17/2024  Medication Sig Note   albuterol (PROVENTIL) (2.5 MG/3ML) 0.083% nebulizer solution Take 3 mLs (2.5 mg total) by nebulization every 6 (six) hours as needed for wheezing or shortness of breath.    albuterol (VENTOLIN HFA) 108 (90 Base) MCG/ACT inhaler Inhale 2 puffs into the lungs every 4 (four) hours as needed for wheezing or shortness of breath.    alendronate (FOSAMAX) 70 MG tablet Take 1 tablet (70 mg total) by mouth once a week.    aspirin EC 81 MG tablet Take 1 tablet (81 mg total) by mouth daily.    Budeson-Glycopyrrol-Formoterol (BREZTRI AEROSPHERE) 160-9-4.8 MCG/ACT AERO Inhale 2 puffs into the lungs in the morning and at bedtime.    busPIRone (BUSPAR) 5 MG tablet TAKE 1 TABLET BY MOUTH TWICE DAILY AS NEEDED FOR ANXIETY    Calcium Carbonate-Vit D-Min (CALCIUM 1200) 1200-1000 MG-UNIT CHEW Chew 1,200 mg by mouth daily.    Cholecalciferol (VITAMIN D3) 125 MCG (5000 UT) TABS Take by mouth. Vitamin D3 5,000 IU daily for 12 weeks then reduce to OTC Vitamin D3 2,000 IU daily for maintenance    ezetimibe (ZETIA) 10 MG tablet Take 1 tablet (10 mg total) by mouth daily.    famotidine (PEPCID) 20 MG tablet Take 1 tablet (20 mg total) by mouth 2 (two) times daily as needed.    feeding supplement (ENSURE ENLIVE / ENSURE PLUS) LIQD Take 237 mLs by mouth 2 (two) times daily between meals.    montelukast (SINGULAIR) 10 MG tablet TAKE 1 TABLET BY MOUTH AT  BEDTIME    Multiple Vitamin (MULTIVITAMIN WITH MINERALS) TABS tablet Take 1 tablet by mouth daily. 11/17/2023: Reports not taking daily   naproxen (NAPROSYN) 500 MG tablet Take 1 tablet (500 mg total) by mouth 2 (two) times daily with a meal. For 1-2 weeks then as needed    OHTUVAYRE 3 MG/2.5ML SUSP Inhale 3 mg into the lungs 2 (two) times daily. (Patient not taking: Reported on 02/04/2024)    omeprazole (PRILOSEC) 20 MG capsule Take 1 capsule (20 mg total) by mouth 2 (two) times daily before a meal.    OXYGEN Inhale 3 L into the lungs at bedtime as needed. 11/17/2023: Reports currently using 4 min/l   predniSONE (STERAPRED UNI-PAK 21 TAB) 10 MG (21) TBPK tablet Take as directed in the package.    rosuvastatin (CRESTOR) 10 MG tablet Take 1 tablet (10 mg total) by mouth daily. 11/17/2023: Reports not taking daily. Reports taking a few times a week at night.   sodium chloride (OCEAN) 0.65 % SOLN nasal spray Place 1 spray into both nostrils as needed for congestion.    Specialty Vitamins Products (HAIR NOURISHING SUPPLEMENT PO) Take 1 tablet by mouth daily.    tiZANidine (ZANAFLEX) 2 MG tablet Take 1 tablet (2 mg total) by mouth every 8 (eight) hours as needed for muscle spasms.    triamcinolone cream (KENALOG) 0.5 % Apply 1 Application topically 2 (two) times daily as needed (allergic reaction on  skin swelling). To affected areas, for up to 2 weeks.    No facility-administered encounter medications on file as of 02/17/2024.    Interventions:   Goals Addressed             This Visit's Progress    Effective Management of Chronic Illnesses       Current Barriers:  Care Management support and education needs related to Effective Management of Chronic Illneses   Planned Interventions-COPD: Provided patient with basic written and verbal COPD education on self care management and exacerbation prevention.  Provided information regarding medications and indications for use. Reports taking  medications and using inhalers as prescribed. Denies concerns related to medication management or prescription cost. Reviewed symptoms. Patient reports she has been experiencing respiratory infections for the past two months without full relief. She is unable to confirm if she is completing course of antibiotics as prescribed as she reports the need to stop taking medications when she feels better. Reports episodes of shortness of breath vary. Reports having "good days and bad days." Notes some days she can complete ADLs and errands without difficulty and other days she experiences symptoms with minimal activity. Denies episodes that required urgent or emergent evaluation. Reports requesting a new referral for Pulmonology. She would like a second opinion regarding her diagnosis of COPD along with treatment options. Her primary care provider placed the referral as requested; however, she reports her insurance provider [facility]) will not cover providers with the [facility] or [facility] teams. States she will consider Hotel manager providers in [place]. Patient is currently using supplemental oxygen at 4l/min. Reports using mostly at night and during the day as needed. Reports she is scheduled for an appointment this month to complete testing to determine eligibility for a portable oxygen concentrator.  Advised patient to self-assess COPD action plan zone and make appointment with provider if in the yellow zone for 48 hours without improvement.  Discussed the importance of adequate rest and management of fatigue with COPD. Education on pacing activity and monitoring for factors that exacerbate her condition.  Denies current need for additional assistance in the home. Advised to update the care team with changes or decline in her functional status.  Provided education related to infection prevention strategies that reduce risk of respiratory infection. Screening for signs and symptoms of depression  related to chronic disease state  Assessed social determinant of health barriers Provided information regarding worsening symptoms that require immediate medical attention.   Planned Interventions-Left Arm Injury(New Concern) Patient reports an acute injury to her left arm. Reports injuring her left arm a few days ago while attempting to retrieve items from a cabinet. She recalls a large can hitting her arm. Denies open wounds to her arm. Reports she initially attempted to treat the injury with ice. Today she reports her left arm is swollen and purple from her armpit to her elbow. She reports significant swelling around her elbow. She reports normal range of motion in this extremity with some discomfort.  Provided education regarding high risk for fracture and need to seek immediate medical attention in the nearest ER or Urgent Care. Patient advised of option to utilize the [facility] clinic. She has a history of osteoporosis and reports breaking her left arm in the past. She reports taking her Fosamax as prescribed. She does voice concerns that this arm may be broken again but declines current advice for emergent or urgent evaluation She  insists that her primary care provider evaluate her  left arm. Contacted the primary care office to schedule acute visit as requested by patient. Appointment scheduled for Monday 02/21/24. Patient agreeable to this date but still insisting to send images to have someone make recommendations for treatment prior to her clinic evaluation. Patient advised that this would not be appropriate due to the possible severity of her injury. She is requesting that her primary care provider be notified of her request to speak with him for treatment recommendations and possible imaging prior to her clinic visit on Monday. She currently declines advice for evaluation in the Emergency Room or Urgent Care. Collaborated with primary care provider via Secure Chat. Provider was on  administrative time and will not be available to complete patient outreach today. Provided prompt advice that a virtual visit could be completed on Friday afternoon, however patient would still be required to complete imaging at another facility and results would likely not be back until 02/21/24. Provider agreeable that patient needs to be evaluated immediately. Advised that patient be notified of availability at [facility]. Attempted to reach patient with follow up advice.  Attempt was unsuccessful.  Update: Additional collaboration with primary care provider. Reports patient presented to the clinic requesting to be seen today. Notes patient was again advised to seek care at the Urgent Care. Reports she was instructed to seek immediate evaluation at the Orthopedic Urgent Care. RNCM will follow up on 02/18/24 to confirm patient completed medical evaluation as advised.     BP Readings from Last 3 Encounters:  02/04/24 120/70  02/02/24 122/60  01/14/24 120/62     Wt Readings from Last 3 Encounters:  02/04/24 124 lb (56.2 kg)  02/02/24 123 lb (55.8 kg)  01/14/24 124 lb 9.6 oz (56.5 kg)     Lab Results  Component Value Date   CHOL 165 12/22/2021   HDL 63 12/22/2021   LDLCALC 89 12/22/2021   TRIG 64 12/22/2021   CHOLHDL 2.6 12/22/2021              Interventions Today    Flowsheet Row Most Recent Value  Chronic Disease   Chronic disease during today's visit Chronic Obstructive Pulmonary Disease (COPD), Hypertension (HTN), Other  [Hyperlipidemia]  General Interventions   General Interventions Discussed/Reviewed General Interventions Discussed, Annual Eye Exam, Labs, Durable Medical Equipment (DME), Lipid Profile, [facility], Doctor Visits, Communication with  Doctor Visits Discussed/Reviewed Doctor Visits Discussed, PCP, Database administrator (DME) Oxygen, BP Cuff  [Discussed plan to obtain portable oxygen concentrator]   PCP/Specialist Visits Compliance with follow-up visit  Communication with PCP/Specialists  [Collaborate with Primary Care Provider regarding acute left arm injury. Patient advised to seek immediate assistance at the nearest ER or Urgent Care. Appointment scheduled for follow up with provider on 02/21/24.]  Exercise Interventions   Exercise Discussed/Reviewed Physical Activity, Assistive device use and maintanence  Physical Activity Discussed/Reviewed Physical Activity Reviewed  Education Interventions   Education Provided Provided Education  Provided Verbal Education On Nutrition, Labs, Applications, Medication, When to see the doctor, [facility], Insurance Plans  Labs Reviewed Lipid Profile  Mental Health Interventions   Mental Health Discussed/Reviewed Mental Health Reviewed  Nutrition Interventions   Nutrition Discussed/Reviewed Nutrition Discussed, Increasing proteins, Decreasing salt, Supplemental nutrition, Decreasing fats, Adding fruits and vegetables  Pharmacy Interventions   Pharmacy Dicussed/Reviewed Pharmacy Topics Discussed, Medications and their functions  Safety Interventions   Safety Discussed/Reviewed Safety Reviewed, Fall Risk  Advanced Directive Interventions   Advanced Directives Discussed/Reviewed Advanced Directives Discussed  [  Will provide additional information in patient instructions]       PLAN -Outreach required additional collaboration with primary care provider. Unable to reach patient for follow up advice and to confirm availability for next follow up. -Nurse Manager will follow up with Ms. Fredman on 02/18/24 to confirm she completed evaluation in the Urgent Care or Emergency Room   [facility] RN Care Manager Direct Dial: [phone]  Fax: [phone] Website: [email or web address]

## 2024-02-18 ENCOUNTER — Other Ambulatory Visit: Payer: Self-pay

## 2024-02-18 DIAGNOSIS — S5002XA Contusion of left elbow, initial encounter: Secondary | ICD-10-CM | POA: Diagnosis not present

## 2024-02-18 NOTE — Patient Instructions (Addendum)
 Thank you for allowing the Care Management team to participate in your care. It was great speaking with you today.  We will follow up on February 24, 2024 at 1pm. Please do not hesitate to contact me if you require assistance prior to our next outreach.    Juanell Fairly [facility] Population Health RN Care Manager Direct Dial: [phone]  Fax: [phone] Website: [email or web address]

## 2024-02-18 NOTE — Patient Outreach (Signed)
 Care Management   Visit Note  02/18/2024 Name: Brandi [name] MRN: [id number] DOB: [birth year]  Subjective: Brandi [name] is a 68 y.o. year old female who is a primary care patient of [name], DO. Engaged with Ms. Whitcomb via telephone.  Assessment:  Review of patient past medical history, allergies, medications, health status, including review of consultants reports, laboratory and other test data, was performed as part of  evaluation and provision of care management services.   Outpatient Encounter Medications as of 02/18/2024  Medication Sig Note   albuterol (PROVENTIL) (2.5 MG/3ML) 0.083% nebulizer solution Take 3 mLs (2.5 mg total) by nebulization every 6 (six) hours as needed for wheezing or shortness of breath.    albuterol (VENTOLIN HFA) 108 (90 Base) MCG/ACT inhaler Inhale 2 puffs into the lungs every 4 (four) hours as needed for wheezing or shortness of breath.    alendronate (FOSAMAX) 70 MG tablet Take 1 tablet (70 mg total) by mouth once a week.    aspirin EC 81 MG tablet Take 1 tablet (81 mg total) by mouth daily.    Budeson-Glycopyrrol-Formoterol (BREZTRI AEROSPHERE) 160-9-4.8 MCG/ACT AERO Inhale 2 puffs into the lungs in the morning and at bedtime.    busPIRone (BUSPAR) 5 MG tablet TAKE 1 TABLET BY MOUTH TWICE DAILY AS NEEDED FOR ANXIETY    Calcium Carbonate-Vit D-Min (CALCIUM 1200) 1200-1000 MG-UNIT CHEW Chew 1,200 mg by mouth daily.    Cholecalciferol (VITAMIN D3) 125 MCG (5000 UT) TABS Take by mouth. Vitamin D3 5,000 IU daily for 12 weeks then reduce to OTC Vitamin D3 2,000 IU daily for maintenance    ezetimibe (ZETIA) 10 MG tablet Take 1 tablet (10 mg total) by mouth daily.    famotidine (PEPCID) 20 MG tablet Take 1 tablet (20 mg total) by mouth 2 (two) times daily as needed.    feeding supplement (ENSURE ENLIVE / ENSURE PLUS) LIQD Take 237 mLs by mouth 2 (two) times daily between meals.    montelukast (SINGULAIR) 10 MG tablet TAKE 1 TABLET BY MOUTH AT BEDTIME     Multiple Vitamin (MULTIVITAMIN WITH MINERALS) TABS tablet Take 1 tablet by mouth daily. 11/17/2023: Reports not taking daily   naproxen (NAPROSYN) 500 MG tablet Take 1 tablet (500 mg total) by mouth 2 (two) times daily with a meal. For 1-2 weeks then as needed    OHTUVAYRE 3 MG/2.5ML SUSP Inhale 3 mg into the lungs 2 (two) times daily. (Patient not taking: Reported on 02/04/2024)    omeprazole (PRILOSEC) 20 MG capsule Take 1 capsule (20 mg total) by mouth 2 (two) times daily before a meal.    OXYGEN Inhale 3 L into the lungs at bedtime as needed. 11/17/2023: Reports currently using 4 min/l   predniSONE (STERAPRED UNI-PAK 21 TAB) 10 MG (21) TBPK tablet Take as directed in the package.    rosuvastatin (CRESTOR) 10 MG tablet Take 1 tablet (10 mg total) by mouth daily. 11/17/2023: Reports not taking daily. Reports taking a few times a week at night.   sodium chloride (OCEAN) 0.65 % SOLN nasal spray Place 1 spray into both nostrils as needed for congestion.    Specialty Vitamins Products (HAIR NOURISHING SUPPLEMENT PO) Take 1 tablet by mouth daily.    tiZANidine (ZANAFLEX) 2 MG tablet Take 1 tablet (2 mg total) by mouth every 8 (eight) hours as needed for muscle spasms.    triamcinolone cream (KENALOG) 0.5 % Apply 1 Application topically 2 (two) times daily as needed (allergic reaction on skin swelling).  To affected areas, for up to 2 weeks.    No facility-administered encounter medications on file as of 02/18/2024.     Interventions:  Interventions Today    Flowsheet Row Most Recent Value  Chronic Disease   Chronic disease during today's visit Other  [Acute Injury to Left Arm]  General Interventions   General Interventions Discussed/Reviewed General Interventions Reviewed, Durable Medical Equipment (DME), Doctor Visits, Communication with  Doctor Visits Discussed/Reviewed Doctor Visits Reviewed, PCP, Specialist  [Pending primary care provider visit on 02/21/24 related to a left arm injury. She  reports she will not be attending that appointment and requested cancellation. Reports being evaluated at [facility] today. No fractures noted.]  Durable Medical Equipment (DME) Other  [Reports an arm sling as offered today but she declined. Reports being advised to follow up with [facility] in two weeks if her symptoms worsen or do not resolve]  Communication with PCP/Specialists  [facility] with Primary Care Provider regarding patient's request to cancel the appointment on 02/21/24 and follow up with [facility] if her symptoms worsen or are not resolved in two weeks.]  Exercise Interventions   Exercise Discussed/Reviewed Assistive device use and maintanence  Education Interventions   Education Provided Provided Education  Provided Verbal Education On Medication, When to see the doctor, Other  [Provided information regarding worsening symptoms that require immediate medical attention. Advised to continue monitoring left arm and follow up with [facility] as recommended if symptoms worsen.]  Safety Interventions   Safety Discussed/Reviewed Safety Reviewed       PLAN Mrs. Vazguez agreed to follow up next week    Juanell Fairly [facility] RN Care Manager Direct Dial: [phone]  Fax: [phone] Website: [email or web address]

## 2024-02-20 LAB — COLOGUARD

## 2024-02-21 ENCOUNTER — Ambulatory Visit: Admitting: Family Medicine

## 2024-02-22 DIAGNOSIS — J449 Chronic obstructive pulmonary disease, unspecified: Secondary | ICD-10-CM | POA: Diagnosis not present

## 2024-02-22 NOTE — Progress Notes (Signed)
 Called pt left a voice message, to advise on new sample collection being sent.

## 2024-02-24 ENCOUNTER — Telehealth: Payer: Self-pay

## 2024-02-24 ENCOUNTER — Other Ambulatory Visit: Payer: Self-pay

## 2024-02-24 NOTE — Patient Outreach (Signed)
  Care Management   Outreach Note  02/24/2024 Name: Brandi [name] MRN: [id number] DOB: 12-16-1955  An unsuccessful outreach attempt was made today for a scheduled Care Management visit.   Follow Up Plan:  A HIPAA compliant phone message was left for the patient providing contact information and requesting a return call.     Juanell Fairly [facility] Population Health RN Care Manager Direct Dial: [phone]  Fax: [phone] Website: [email or web address]

## 2024-02-26 DIAGNOSIS — Z1211 Encounter for screening for malignant neoplasm of colon: Secondary | ICD-10-CM | POA: Diagnosis not present

## 2024-03-02 LAB — COLOGUARD: COLOGUARD: NEGATIVE

## 2024-03-07 ENCOUNTER — Ambulatory Visit (INDEPENDENT_AMBULATORY_CARE_PROVIDER_SITE_OTHER): Payer: No Typology Code available for payment source | Admitting: Pulmonary Disease

## 2024-03-07 ENCOUNTER — Encounter: Payer: Self-pay | Admitting: Pulmonary Disease

## 2024-03-07 VITALS — BP 106/64 | HR 93 | Temp 97.7°F | Ht 64.0 in | Wt 127.6 lb

## 2024-03-07 DIAGNOSIS — J9611 Chronic respiratory failure with hypoxia: Secondary | ICD-10-CM

## 2024-03-07 DIAGNOSIS — R0602 Shortness of breath: Secondary | ICD-10-CM

## 2024-03-07 DIAGNOSIS — J449 Chronic obstructive pulmonary disease, unspecified: Secondary | ICD-10-CM

## 2024-03-07 NOTE — Progress Notes (Unsigned)
 Subjective:    Patient ID: Brandi [name], female    DOB: 02-02-1956, 68 y.o.   MRN: [id number]  Patient Care Team: [name], DO as PCP - General (Family Medicine) [name], MD as Consulting Physician (Pulmonary Disease) Juanell Fairly, RN as [facility] Care Management  Chief Complaint  Patient presents with   Follow-up    Shortness of breath on exertion. No cough or wheezing.     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  Last evaluated here on 04 February 2024.   HPI Discussed the use of AI scribe software for clinical note transcription with the patient, who gave verbal consent to proceed.  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  She has been managing her stage three severe COPD/asthma and chronic respiratory failure with hypoxia using albuterol and Breztri inhaler, which she finds beneficial. However, she has not been using the new nebulizer solution, Ohtuvayre, as recommended. There have been no significant issues with pollen exposure recently.  She uses a portable oxygen device, especially when traveling, to manage her chronic respiratory failure with hypoxia. She recently underwent a Lexiscan test and noted her breathing was 'pretty good' during the test.  Her severe COPD affects her breathing, particularly after meals. She experiences gastrointestinal symptoms, such as early satiety when eating, which also impact her breathing.  She reports nasal congestion on one side and has been advised to follow up with a the ENT MD she saw several years ago, possibly Dr. Andee Poles or Jenne Campus.    She mentions that her cholesterol levels were not optimal, but she is under the care  of Dr. Althea Charon for this issue.   She seems calmer and less forgetful than previous.   DATA 04/09/2021 LDCT: Moderate to severe paraseptal and centrilobular emphysema.  Left upper lobe nodule measuring 2 mm (decreased from prior), compression fractures T7 and T9 noted previously and unchanged.  Coronary artery calcifications. 07/04/2021 alpha-1 antitrypsin: Phenotype MM, level 140 mg/dL (normal) 40/98/1191 overnight oximetry: Baseline O2 sat 90% during sleep with desaturations as low as 83% during the night desaturation events lasted for over an hour. 08/14/2021 PFTs: FEV1 1.13 L or 46% predicted, FVC 2.42 L or 76% predicted, FEV1/FVC 47%, there is a mild response to bronchodilator with a 10% net change.  There is hyperinflation with TLC at 128% and air trapping with RV at 188%.  Diffusion capacity is moderately to severely reduced by Kco.  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 08/14/2021 echocardiogram: LVEF 60 to 65%, grade 1 DD 12/04/2021 chest x-ray: Emphysema with no evidence of acute cardiopulmonary disease 12/24/2021 overnight oximetry: Baseline O2 sat 90% desaturations as low as 83%.   12/26/2021 nuclear medicine stress test: No significant ischemia, normal wall motion, EF estimated at 77%, coronary calcification in the LAD, mild aortic atherosclerosis 01/05/2023 chest x-ray PA and lateral: Infiltrates in the right middle lobe favor to be infectious/inflammatory 02/23/2023 chest x-ray PA and lateral: Improved though incompletely resolved middle lobe infection, minimal residual, emphysema 05/25/2023 CT chest without contrast: No acute cardiopulmonary disease, moderate to severe centrilobular emphysematous disease without significant change.  Borderline cardiomegaly unchanged.  Atherosclerotic coronary artery disease.  Stable mild superior endplate compression deformity mid thoracic spine.  05/25/2023 PFTs: FEV1 1.0 L or 42% predicted, FVC 2.44 L or 78% predicted, FEV1/FVC  41%, there is a reversible component with FEV1 improvement by 17% after bronchodilator.  There is hyperinflation and air trapping.  Diffusion capacity severely impaired, this is consistent with emphysema and reversible airways component.  Lung function has shown modest decrease from prior. 01/31/2024 CXR PA and lateral: Significant COPD/emphysema changes, no active cardiopulmonary disease.  Flattening of the diaphragms noted in keeping with severe COPD. 02/04/2024 alpha-1 antitrypsin: Verified MM, level 167 mg/dL (normal)  Review of Systems A 10 point review of systems was performed and it is as noted above otherwise negative.   Patient Active Problem List   Diagnosis Date Noted   Osteoporosis 11/29/2023   Rash and nonspecific skin eruption 05/22/2023   Facial tingling sensation 05/22/2023   Pre-diabetes 04/08/2021   Vitamin D deficiency 04/08/2021   COVID-19 vaccination declined 12/04/2020   Centrilobular emphysema (HCC) 04/08/2020   Nodule of upper lobe of left lung 04/08/2020   Aortic atherosclerosis (HCC) 05/21/2019   Coronary artery calcification seen on CT scan 05/21/2019   Former smoker 05/21/2019   Hyperlipidemia 05/21/2019    Social History   Tobacco Use   Smoking status: Former    Current packs/day: 0.00    Average packs/day: 0.8 packs/day for 41.0 years (30.8 ttl pk-yrs)    Types: Cigarettes    Start date: 12/07/1974    Quit date: 12/08/2015    Years since quitting: 8.2   Smokeless tobacco: Never  Substance Use Topics   Alcohol use: No    No Known Allergies  Current Meds  Medication Sig   albuterol (PROVENTIL) (2.5 MG/3ML) 0.083% nebulizer solution Take 3 mLs (2.5 mg total) by nebulization every 6 (six) hours as needed for wheezing or shortness of breath.   albuterol (VENTOLIN HFA) 108 (90 Base) MCG/ACT inhaler Inhale 2 puffs into the lungs every 4 (four) hours as needed for wheezing or shortness of breath.   alendronate (FOSAMAX) 70 MG tablet Take 1 tablet (70 mg  total) by mouth once a week.   aspirin EC 81 MG tablet Take 1 tablet (81 mg total) by mouth daily.   Budeson-Glycopyrrol-Formoterol (BREZTRI AEROSPHERE) 160-9-4.8 MCG/ACT AERO Inhale 2 puffs into the lungs in the morning and at bedtime.   busPIRone (BUSPAR) 5 MG tablet TAKE 1 TABLET BY MOUTH TWICE DAILY AS NEEDED FOR ANXIETY   Calcium Carbonate-Vit D-Min (CALCIUM 1200) 1200-1000 MG-UNIT CHEW Chew 1,200 mg by mouth daily.   Cholecalciferol (VITAMIN D3) 125 MCG (5000 UT) TABS Take by mouth. Vitamin D3 5,000 IU daily for 12 weeks then reduce to OTC Vitamin D3 2,000 IU daily for maintenance   ezetimibe (ZETIA) 10 MG tablet Take 1 tablet (10 mg total) by mouth daily.   famotidine (PEPCID) 20 MG tablet Take 1 tablet (20 mg total) by mouth 2 (two) times daily as needed.   feeding supplement (ENSURE ENLIVE / ENSURE PLUS) LIQD Take 237 mLs by mouth 2 (two) times daily between meals.   montelukast (SINGULAIR) 10 MG tablet TAKE 1 TABLET BY MOUTH AT BEDTIME   Multiple Vitamin (MULTIVITAMIN WITH MINERALS) TABS tablet Take 1 tablet by mouth daily.   naproxen (NAPROSYN) 500 MG tablet Take 1 tablet (500 mg total) by mouth 2 (two) times daily with a meal. For 1-2 weeks then as needed   OHTUVAYRE 3 MG/2.5ML SUSP Inhale 3 mg into the lungs 2 (two) times daily.   omeprazole (PRILOSEC) 20 MG capsule Take 1 capsule (20  mg total) by mouth 2 (two) times daily before a meal.   OXYGEN Inhale 3 L into the lungs at bedtime as needed.   rosuvastatin (CRESTOR) 10 MG tablet Take 1 tablet (10 mg total) by mouth daily.   sodium chloride (OCEAN) 0.65 % SOLN nasal spray Place 1 spray into both nostrils as needed for congestion.   Specialty Vitamins Products (HAIR NOURISHING SUPPLEMENT PO) Take 1 tablet by mouth daily.   tiZANidine (ZANAFLEX) 2 MG tablet Take 1 tablet (2 mg total) by mouth every 8 (eight) hours as needed for muscle spasms.   triamcinolone cream (KENALOG) 0.5 % Apply 1 Application topically 2 (two) times daily as  needed (allergic reaction on skin swelling). To affected areas, for up to 2 weeks.        Objective:     BP 106/64 (BP Location: Left Arm, Patient Position: Sitting, Cuff Size: Normal)   Pulse 93   Temp 97.7 F (36.5 C) (Temporal)   Ht 5\' 4"  (1.626 m)   Wt 127 lb 9.6 oz (57.9 kg)   SpO2 96%   BMI 21.90 kg/m   SpO2: 96 %  GENERAL: Well-developed, well-nourished woman, no acute distress.  Presents in transport chair due to dyspnea.  Uncomfortable but no tachypnea, no conversational dyspnea.  Using a friend's POC. HEAD: Normocephalic, atraumatic.  EYES: Pupils equal, round, reactive to light.  No scleral icterus.  MOUTH: Poor dentition. She does have visible nasal septal deviation.  Oral mucosa moist, no thrush. NECK: Supple. No thyromegaly. Trachea midline. No JVD.  No adenopathy. CHEST: Very tender costochondral junction particularly on the left PULMONARY: Distant breath sounds, fair air movement. Coarse, otherwise no adventitious sounds. CARDIOVASCULAR: S1 and S2. Regular rate and rhythm.  No rubs, murmurs or gallops heard. ABDOMEN: Benign. MUSCULOSKELETAL: No joint deformity, no clubbing, no edema.  NEUROLOGIC: No focal deficit, speech is fluent.  Unsteady gait due to generalized weakness. SKIN: Intact,warm,dry. PSYCH: Mood and behavior normal.        Assessment & Plan:     ICD-10-CM   1. Stage 3 severe COPD by GOLD classification (HCC)  J44.9 Pulmonary Function Test [facility] Only    2. Chronic respiratory failure with hypoxia (HCC)  J96.11 Pulmonary Function Test [facility] Only    3. Shortness of breath  R06.02 Pulmonary Function Test [facility] Only      Orders Placed This Encounter  Procedures   Pulmonary Function Test [facility] Only    Standing Status:   Future    Expected Date:   06/06/2024    Expiration Date:   03/07/2025    Full PFT: includes the following: basic spirometry, spirometry pre & post bronchodilator, diffusion capacity (DLCO), lung volumes:   Full PFT    This test  can only be performed at:   [facility]   Discussion:    Severe COPD/asthma Stage three severe COPD/asthma. Currently using albuterol but not the prescribed Ohtuvayre nebulizer solution. Emphasized the importance of using Ohtuvayre twice daily to improve lung function. Breztri inhaler is being used and is beneficial.  Ohtuvayre works in conjunction with Clinical cytogeneticist to enhance lung function. - Instruct to use Ohtuvayre nebulizer solution twice daily - Continue Breztri inhaler - Schedule follow-up in three months with breathing tests prior to the visit  Severe COPD with air trapping Severe COPD affects breathing, especially postprandially. Advised dietary modifications to manage symptoms, including smaller, more frequent meals opting for protein/vegetables and avoiding starches to prevent shortness of breath. - Advise to eat smaller, more  frequent meals - Recommend a diet high in protein, fruits, and vegetables, and low in starches  Chronic Respiratory Failure with Hypoxia Chronic respiratory failure with hypoxia. Uses a portable oxygen device, especially when traveling. Acknowledged the importance of having the portable oxygen device.  Nasal Congestion Reports persistent nasal congestion on one side. Suggested follow-up with an ENT specialist, at [facility], for further evaluation. - Advise to contact ENT specialist for evaluation of nasal congestion  Hypercholesterolemia Hypercholesterolemia managed by Dr. Althea Charon. Cholesterol levels were not tested during the recent visit but suspect will be checked during the next physical exam.  Parasite Concerns Expressed concerns about potential lung parasites due to past exposure and gastrointestinal symptoms. Reassured that lung parasites are uncommon and typically present with gastrointestinal symptoms rather than respiratory issues.    Advised if symptoms do not improve or worsen, to please contact office for sooner follow up or seek  emergency care.    I spent 32 minutes of dedicated to the care of this patient on the date of this encounter to include pre-visit review of records, face-to-face time with the patient discussing conditions above, post visit ordering of testing, clinical documentation with the electronic health record, making appropriate referrals as documented, and communicating necessary findings to members of the patients care team.     C. Danice Goltz, MD Advanced Bronchoscopy P[facility]    *This note was generated using voice recognition software/Dragon and/or AI transcription program.  Despite best efforts to proofread, errors can occur which can change the meaning. Any transcriptional errors that result from this process are unintentional and may not be fully corrected at the time of dictation.

## 2024-03-07 NOTE — Patient Instructions (Addendum)
 VISIT SUMMARY:  Today, you visited for the management of your respiratory conditions, including severe asthma, severe COPD, and chronic respiratory failure with hypoxia. We discussed your current treatments and made some adjustments to improve your symptoms. We also addressed your nasal congestion and concerns about cholesterol levels and potential lung parasites.  YOUR PLAN:  SEVERE COPD WITH ASTHMA OVERLAP severe COPD with asthma is a chronic condition where your airways become inflamed and narrowed, making it difficult to breathe. You should start using the Ohtuvayre nebulizer solution twice daily in addition to continuing your Breztri inhaler. This combination will help improve your lung function. We will schedule a follow-up in three months, and you will have breathing tests before that visit. To help manage your symptoms, especially after meals, you should eat smaller, more frequent meals and avoid starchy foods. A diet high in protein, fruits, and vegetables is recommended.  -CHRONIC RESPIRATORY FAILURE WITH HYPOXIA: Chronic respiratory failure with hypoxia means your lungs cannot get enough oxygen into your blood. Continue using your portable oxygen device, especially when traveling, to manage this condition.  -NASAL CONGESTION: Persistent nasal congestion can be due to various causes. You should follow up with an ENT specialist, possibly Dr. Andee Poles or Dr. Jenne Campus, for further evaluation.  You have are an established patient of their practice you should be able to call them and make an appointment.  -PARASITE CONCERNS: You expressed concerns about potential lung parasites. Lung parasites are uncommon and usually present with gastrointestinal symptoms rather than respiratory issues. There is no indication of lung parasites at this time.  INSTRUCTIONS:  Please start using the Ohtuvayre nebulizer solution twice daily and continue with your Breztri inhaler. Schedule a follow-up appointment in  three months after your breathing tests are done. Contact an ENT specialist, possibly Dr. Andee Poles or Dr. Jenne Campus, for your nasal congestion. Continue to manage your cholesterol levels with Dr. Althea Charon and have them checked during your next physical exam.

## 2024-03-15 ENCOUNTER — Other Ambulatory Visit: Payer: Self-pay

## 2024-03-15 DIAGNOSIS — Z008 Encounter for other general examination: Secondary | ICD-10-CM | POA: Diagnosis not present

## 2024-03-15 DIAGNOSIS — Z7189 Other specified counseling: Secondary | ICD-10-CM | POA: Diagnosis not present

## 2024-03-15 DIAGNOSIS — J441 Chronic obstructive pulmonary disease with (acute) exacerbation: Secondary | ICD-10-CM | POA: Diagnosis not present

## 2024-03-24 DIAGNOSIS — J449 Chronic obstructive pulmonary disease, unspecified: Secondary | ICD-10-CM | POA: Diagnosis not present

## 2024-04-04 ENCOUNTER — Other Ambulatory Visit: Payer: Self-pay

## 2024-04-04 DIAGNOSIS — J449 Chronic obstructive pulmonary disease, unspecified: Secondary | ICD-10-CM

## 2024-04-04 DIAGNOSIS — R0602 Shortness of breath: Secondary | ICD-10-CM

## 2024-04-05 ENCOUNTER — Ambulatory Visit: Admitting: Pulmonary Disease

## 2024-04-05 DIAGNOSIS — J449 Chronic obstructive pulmonary disease, unspecified: Secondary | ICD-10-CM | POA: Diagnosis not present

## 2024-04-05 DIAGNOSIS — J9611 Chronic respiratory failure with hypoxia: Secondary | ICD-10-CM

## 2024-04-05 DIAGNOSIS — R0602 Shortness of breath: Secondary | ICD-10-CM

## 2024-04-05 LAB — PULMONARY FUNCTION TEST
DL/VA % pred: 53 %
DL/VA: 2.22 ml/min/mmHg/L
DLCO unc % pred: 46 %
DLCO unc: 9.04 ml/min/mmHg
FEF 25-75 Post: 0.57 L/s
FEF 25-75 Pre: 0.26 L/s
FEF2575-%Change-Post: 115 %
FEF2575-%Pred-Post: 28 %
FEF2575-%Pred-Pre: 13 %
FEV1-%Change-Post: 38 %
FEV1-%Pred-Post: 44 %
FEV1-%Pred-Pre: 32 %
FEV1-Post: 1.04 L
FEV1-Pre: 0.75 L
FEV1FVC-%Change-Post: 13 %
FEV1FVC-%Pred-Pre: 51 %
FEV6-%Change-Post: 28 %
FEV6-%Pred-Post: 75 %
FEV6-%Pred-Pre: 58 %
FEV6-Post: 2.21 L
FEV6-Pre: 1.72 L
FEV6FVC-%Change-Post: 4 %
FEV6FVC-%Pred-Post: 98 %
FEV6FVC-%Pred-Pre: 93 %
FVC-%Change-Post: 22 %
FVC-%Pred-Post: 76 %
FVC-%Pred-Pre: 62 %
FVC-Post: 2.35 L
FVC-Pre: 1.91 L
Post FEV1/FVC ratio: 44 %
Post FEV6/FVC ratio: 94 %
Pre FEV1/FVC ratio: 39 %
Pre FEV6/FVC Ratio: 90 %
RV % pred: 205 %
RV: 4.41 L
TLC % pred: 138 %
TLC: 6.98 L

## 2024-04-05 NOTE — Progress Notes (Signed)
 Full PFT completed today ? ?

## 2024-04-05 NOTE — Patient Instructions (Signed)
 Full PFT completed today ? ?

## 2024-04-07 ENCOUNTER — Other Ambulatory Visit: Payer: Self-pay

## 2024-04-18 ENCOUNTER — Telehealth: Payer: Self-pay | Admitting: Pulmonary Disease

## 2024-04-18 DIAGNOSIS — Z008 Encounter for other general examination: Secondary | ICD-10-CM | POA: Diagnosis not present

## 2024-04-18 DIAGNOSIS — J189 Pneumonia, unspecified organism: Secondary | ICD-10-CM | POA: Diagnosis not present

## 2024-04-18 DIAGNOSIS — Z9981 Dependence on supplemental oxygen: Secondary | ICD-10-CM | POA: Diagnosis not present

## 2024-04-18 NOTE — Telephone Encounter (Signed)
 I have notified the patient. Nothing further needed.

## 2024-04-18 NOTE — Telephone Encounter (Signed)
 These results are made available online however formal discussion of the results is done during follow-up appointment.  This way the test can be placed on proper context.

## 2024-04-18 NOTE — Telephone Encounter (Signed)
 Brandi [name] called into the office on my phone. She stated that she has the PFT results through Mychart but doesn't know what the numbers mean. She stated she thought someone would have called her by now. I told her Dr. Viva Grise was probably going to go over the results at her next appt

## 2024-04-20 DIAGNOSIS — J441 Chronic obstructive pulmonary disease with (acute) exacerbation: Secondary | ICD-10-CM | POA: Diagnosis not present

## 2024-04-20 DIAGNOSIS — Z008 Encounter for other general examination: Secondary | ICD-10-CM | POA: Diagnosis not present

## 2024-04-21 ENCOUNTER — Ambulatory Visit: Payer: Self-pay

## 2024-04-21 DIAGNOSIS — Z008 Encounter for other general examination: Secondary | ICD-10-CM | POA: Diagnosis not present

## 2024-04-21 DIAGNOSIS — Z9981 Dependence on supplemental oxygen: Secondary | ICD-10-CM | POA: Diagnosis not present

## 2024-04-21 DIAGNOSIS — J432 Centrilobular emphysema: Secondary | ICD-10-CM

## 2024-04-21 DIAGNOSIS — J189 Pneumonia, unspecified organism: Secondary | ICD-10-CM | POA: Diagnosis not present

## 2024-04-21 NOTE — Telephone Encounter (Signed)
 Copied from CRM [id number]. Topic: Clinical - Red Word Triage >> Apr 21, 2024 11:09 AM Baldomero Bone wrote: Red Word that prompted transfer to Nurse Triage: Adell Hones, NP with [facility] says patient recurring pneumonia with levoquin only slighty improving. Patient is oxygen  dependent. Having trouble breathing. Callback number [phone]  Chief Complaint: recurrent pneumonia; NP called in for triage.  Would like patient seen today.  She is currently on Levaquin .  Symptoms: wheezing, sob Frequency: constant Pertinent Negatives: Patient denies fever Disposition: [] ED /[] Urgent Care (no appt availability in office) / [x] Appointment(In office/virtual)/ []  [place] [facility]/ [] Home Care/ [] Refused Recommended Disposition /[] [place] [facility]/ []  Follow-up with PCP Additional Notes: currently being treated for Pneumonia, hx copd.  NP would like patient seen today; states symptoms not improving with Levaquin  and she has been on it for 4 days. Spo2 does dip into the 80's. Care advice given, denies questions; instructed to go to ER if becomes worse.   Reason for Disposition  Oxygen  level (e.g., pulse oximetry) 91 to 94 percent  Answer Assessment - Initial Assessment Questions 1. RESPIRATORY STATUS: "Describe your breathing?" (e.g., wheezing, shortness of breath, unable to speak, severe coughing)      Shortness of breath 2. ONSET: "When did this breathing problem begin?"      Currently has pneumonia 3. PATTERN "Does the difficult breathing come and go, or has it been constant since it started?"      Constant dropping into the 80's 4. SEVERITY: "How bad is your breathing?" (e.g., mild, moderate, severe)    - MILD: No SOB at rest, mild SOB with walking, speaks normally in sentences, can lie down, no retractions, pulse < 100.    - MODERATE: SOB at rest, SOB with minimal exertion and prefers to sit, cannot lie down flat, speaks in phrases, mild retractions, audible wheezing, pulse 100-120.    -  SEVERE: Very SOB at rest, speaks in single words, struggling to breathe, sitting hunched forward, retractions, pulse > 120      Hx copd currently has pneumonia and on Levaquin   5. RECURRENT SYMPTOM: "Have you had difficulty breathing before?" If Yes, ask: "When was the last time?" and "What happened that time?"      Yes, hx copd 6. CARDIAC HISTORY: "Do you have any history of heart disease?" (e.g., heart attack, angina, bypass surgery, angioplasty)      Cad,  7. LUNG HISTORY: "Do you have any history of lung disease?"  (e.g., pulmonary embolus, asthma, emphysema)     Copd, pneumonia 8. CAUSE: "What do you think is causing the breathing problem?"      pneumonia 9. OTHER SYMPTOMS: "Do you have any other symptoms? (e.g., dizziness, runny nose, cough, chest pain, fever)     Pneumonia  10. O2 SATURATION MONITOR:  "Do you use an oxygen  saturation monitor (pulse oximeter) at home?" If Yes, ask: "What is your reading (oxygen  level) today?" "What is your usual oxygen  saturation reading?" (e.g., 95%)       Drops into the 80's 11. PREGNANCY: "Is there any chance you are pregnant?" "When was your last menstrual period?"       na 12. TRAVEL: "Have you traveled out of the country in the last month?" (e.g., travel history, exposures)       no  Protocols used: Breathing Difficulty-A-AH

## 2024-04-21 NOTE — Addendum Note (Signed)
 Addended by: Raina Bunting on: 04/21/2024 11:37 AM   Modules accepted: Orders

## 2024-04-21 NOTE — Telephone Encounter (Signed)
 See other triage note from today. I called her. She has apt on Monday. I have placed referral for her and she will go to ED UC if worsening as we discussed if need over weekend  Domingo Friend, DO [facility] 04/21/2024, 11:37 AM

## 2024-04-21 NOTE — Telephone Encounter (Signed)
  Chief Complaint: sob Symptoms: sob, congestion Frequency: constant since last week Pertinent Negatives: Patient denies chest pain Disposition: [] ED /[] Urgent Care (no appt availability in office) / [] Appointment(In office/virtual)/ []  [place] [facility]/ [] Home Care/ [] Refused Recommended Disposition /[] [place] [facility]/ []  Follow-up with PCP Additional Notes:  Sick all week with congestion and shortness of breath. COPD. Supplemental oxygen  when sleeping.  4th day antibiotic rx community nurse. Calling today to schedule with pcp. She is still experiencing shortness of breath, feels shaky. Speaking in full sentences during the call. No appointments available with pcp or alternate providers at practice locations, suggested urgent care but patient refuses and insists on appointment with PCP. Please review schedule for acute availability today and follow up with Chelisa.   Copied from CRM [id number]. Topic: Clinical - Red Word Triage >> Apr 21, 2024 10:28 AM Turkey B wrote: [facility] that prompted transfer to Nurse Triage: pt has congestion and sob Reason for Disposition  [1] MILD difficulty breathing (e.g., minimal/no SOB at rest, SOB with walking, pulse <100) AND [2] NEW-onset or WORSE than normal  Protocols used: Breathing Difficulty-A-AH

## 2024-04-21 NOTE — Telephone Encounter (Signed)
 I called patient. She has apt with me on Monday 5/19 for acute for this same reason. She has apt with her Pulmonology on Friday next week.  She is asking for referral to Infectious Disease specialist for evaluation for recurrent pneumonia.  She is already on Levaquin  Day 4 for current infection. She does seem to be improving but says when comes off antibiotics symptoms get worse usually.  I advised her that there is not much else I have to offer with an acute apt this afternoon for her current respiratory situation. I would not change her antibiotic today, and she is already on correct medicine. If oxygen  does go lower, she may need to seek care at hospital ED or Urgent care as advised. She understands.  She will call us  Monday AM and keep or cancel the apt based on how she feels.  Domingo Friend, DO [facility] 04/21/2024, 11:36 AM

## 2024-04-23 DIAGNOSIS — J449 Chronic obstructive pulmonary disease, unspecified: Secondary | ICD-10-CM | POA: Diagnosis not present

## 2024-04-24 ENCOUNTER — Encounter: Payer: Self-pay | Admitting: Family Medicine

## 2024-04-24 ENCOUNTER — Ambulatory Visit: Admitting: Family Medicine

## 2024-04-24 DIAGNOSIS — K219 Gastro-esophageal reflux disease without esophagitis: Secondary | ICD-10-CM | POA: Diagnosis not present

## 2024-04-24 MED ORDER — OMEPRAZOLE 20 MG PO CPDR: 20.0000 mg | DELAYED_RELEASE_CAPSULE | Freq: Two times a day (BID) | ORAL | 3 refills | Status: AC

## 2024-04-24 NOTE — Progress Notes (Signed)
 Subjective:    Patient ID: Brandi [name], female    DOB: 02/04/1956, 68 y.o.   MRN: [id number]  Brandi [name] is a 68 y.o. female presenting on 04/24/2024 for COPD and Recurrent Pneumonia / Lung Infections  Patient presents for a same day appointment.   HPI  Discussed the use of AI scribe software for clinical note transcription with the patient, who gave verbal consent to proceed.  History of Present Illness   Brandi [name] is a 68 year old female with COPD who presents with persistent respiratory symptoms and concerns about pneumonia.  She has experienced persistent respiratory symptoms over the weekend. Despite completing a seven-day course of Levaquin  750 mg, taking half a tablet twice daily due to side effects like shaking, she only notes some relief with sputum production. She remains weak and short of breath, particularly with movement, requiring oxygen  at night and occasionally during the day.  She has undergone multiple antibiotic courses since September. Her symptoms recur after completing antibiotics. A chest x-ray performed by [facility] team reportedly showed pneumonia, but details on whether it affected one or both lungs are unclear. She questions whether her pneumonia could be fungal, as she has not had a culture to confirm the type.  She has a history of Severe COPD, which she believes worsened after contracting COVID-19 three years ago. She experiences frequent respiratory infections and questions if her inhalers, including albuterol  and Breztri , could contribute to these infections. She uses albuterol  more frequently and wonders if this could be causing upper respiratory issues.  She also has concerns about her heart health, noting shortness of breath when carrying items. She is on medications for cholesterol management.  She inquires about the possibility of long-term COVID contributing to her symptoms, as she did not experience such frequent respiratory issues before  contracting COVID-19. She also mentions a recent tick bite on her leg, which she removed and treated with topical medication, questioning if it could be related to her symptoms.  Her current medications include albuterol , Breztri , Ohtuvayre  neb, montelukast , and a weekly osteoporosis pill. She also takes medications for anxiety, cholesterol, and stomach acid. She is concerned about potential interactions between her medications and their side effects.        04/24/2024    2:46 PM 02/17/2024    1:28 PM 09/09/2023    1:33 PM  Depression screen PHQ 2/9  Decreased Interest 0 0 0  Down, Depressed, Hopeless 0 0 0  PHQ - 2 Score 0 0 0  Altered sleeping 2  0  Tired, decreased energy 2  0  Change in appetite 0  0  Feeling bad or failure about yourself  0  0  Trouble concentrating 0  0  Moving slowly or fidgety/restless 0  0  Suicidal thoughts 0  0  PHQ-9 Score 4  0  Difficult doing work/chores Somewhat difficult  Not difficult at all       04/24/2024    2:47 PM 03/26/2023   10:03 AM 02/12/2022   10:42 AM 08/12/2021   11:07 AM  GAD 7 : Generalized Anxiety Score  Nervous, Anxious, on Edge 1 0 0 0  Control/stop worrying 0 0 0 0  Worry too much - different things 0 0 0 0  Trouble relaxing 0 0 0 0  Restless 0 0 0 0  Easily annoyed or irritable 0 0 0 0  Afraid - awful might happen 0 0 0 0  Total GAD 7 Score 1 0 0  0  Anxiety Difficulty Somewhat difficult Not difficult at all Not difficult at all Not difficult at all    Social History   Tobacco Use   Smoking status: Former    Current packs/day: 0.00    Average packs/day: 0.8 packs/day for 41.0 years (30.8 ttl pk-yrs)    Types: Cigarettes    Start date: 12/07/1974    Quit date: 12/08/2015    Years since quitting: 8.3   Smokeless tobacco: Never  Vaping Use   Vaping status: Never Used  Substance Use Topics   Alcohol use: No   Drug use: No    Review of Systems Per HPI unless specifically indicated above     Objective:     BP 124/80  (BP Location: Right Arm, Patient Position: Sitting, Cuff Size: Normal)   Pulse (!) 111   Ht 5\' 4"  (1.626 m)   Wt 123 lb (55.8 kg)   SpO2 97%   BMI 21.11 kg/m   Wt Readings from Last 3 Encounters:  04/24/24 123 lb (55.8 kg)  04/05/24 125 lb (56.7 kg)  03/07/24 127 lb 9.6 oz (57.9 kg)    Physical Exam Vitals and nursing note reviewed.  Constitutional:      General: She is not in acute distress.    Appearance: She is well-developed. She is not diaphoretic.     Comments: Well-appearing, comfortable, cooperative  HENT:     Head: Normocephalic and atraumatic.  Eyes:     General:        Right eye: No discharge.        Left eye: No discharge.     Conjunctiva/sclera: Conjunctivae normal.  Neck:     Thyroid : No thyromegaly.  Cardiovascular:     Rate and Rhythm: Normal rate and regular rhythm.     Heart sounds: Normal heart sounds. No murmur heard. Pulmonary:     Effort: Pulmonary effort is normal. No respiratory distress.     Breath sounds: No wheezing or rales.     Comments: On Room air  Reduced air movement diffusely at her baseline. No abnormal additional breath sounds. Musculoskeletal:        General: Normal range of motion.     Cervical back: Normal range of motion and neck supple.  Lymphadenopathy:     Cervical: No cervical adenopathy.  Skin:    General: Skin is warm and dry.     Findings: No erythema or rash.  Neurological:     Mental Status: She is alert and oriented to person, place, and time.  Psychiatric:        Behavior: Behavior normal.     Comments: Well groomed, good eye contact, normal speech and thoughts     Results for orders placed or performed in visit on 04/05/24  Pulmonary function test   Collection Time: 04/05/24  1:56 PM  Result Value Ref Range   FVC-Pre 1.91 L   FVC-%Pred-Pre 62 %   FVC-Post 2.35 L   FVC-%Pred-Post 76 %   FVC-%Change-Post 22 %   FEV1-Pre 0.75 L   FEV1-%Pred-Pre 32 %   FEV1-Post 1.04 L   FEV1-%Pred-Post 44 %    FEV1-%Change-Post 38 %   FEV6-Pre 1.72 L   FEV6-%Pred-Pre 58 %   FEV6-Post 2.21 L   FEV6-%Pred-Post 75 %   FEV6-%Change-Post 28 %   Pre FEV1/FVC ratio 39 %   FEV1FVC-%Pred-Pre 51 %   Post FEV1/FVC ratio 44 %   FEV1FVC-%Change-Post 13 %   Pre FEV6/FVC Ratio 90 %  FEV6FVC-%Pred-Pre 93 %   Post FEV6/FVC ratio 94 %   FEV6FVC-%Pred-Post 98 %   FEV6FVC-%Change-Post 4 %   FEF 25-75 Pre 0.26 L/sec   FEF2575-%Pred-Pre 13 %   FEF 25-75 Post 0.57 L/sec   FEF2575-%Pred-Post 28 %   FEF2575-%Change-Post 115 %   RV 4.41 L   RV % pred 205 %   TLC 6.98 L   TLC % pred 138 %   DLCO unc 9.04 ml/min/mmHg   DLCO unc % pred 46 %   DL/VA 1.61 ml/min/mmHg/L   DL/VA % pred 53 %      Assessment & Plan:   Problem List Items Addressed This Visit   None Visit Diagnoses       Gastroesophageal reflux disease without esophagitis       Relevant Medications   omeprazole  (PRILOSEC) 20 MG capsule        Severe Chronic Obstructive Pulmonary Disease (COPD) Followed by [facility] Pulmonology COPD with frequent exacerbations, possibly worsened by 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 Significant dyspnea with minimal exertion, requiring nocturnal oxygen  therapy. Benefits of inhalers outweigh risks. - Continue current inhalers and nebulizers, including Breztri  and albuterol . - Recently referred to Infectious Disease for further consultation on recurrent pneumonia infections - Discuss potential post-COVID clinic referral with pulmonologist. Consider Long COVID Clinic referral - offer [facility] but it is not in network - Monitor oxygen  use during exertion and at night.  History of recurrent Pneumonia Recent pneumonia with ongoing symptoms despite Levaquin  750mg  x 7 days (was taking half tab TWICE A DAY to tolerate / swallow it) . Unclear etiology; culture needed for definitive diagnosis. - Referral to infectious disease specialist for further evaluation and management. - Consider chest x-ray in 2-3 weeks to  re-evaluate. - Monitor symptoms and follow up with infectious disease specialist.  Coronary Artery Disease (CAD), non obstructive CAD with previous stress test showing no significant ischemia. Dyspnea with exertion, questioning if related to cardiac or pulmonary issues. Recent evaluation showed no significant cardiac strain 2023-2024 This is less likely cause of her dyspnea - Continue current cardiac and lipid-lowering medications. - Follow up with cardiologist as needed.  Osteoporosis Osteoporosis managed with weekly medication. Reports running out of medication; refills confirmed available at pharmacy. - Ensure refills for osteoporosis medication are available at pharmacy.  Anxiety Anxiety related to health issues, particularly respiratory difficulties and medication side effects. - Continue Buspar  as needed for anxiety management.        No orders of the defined types were placed in this encounter.   Meds ordered this encounter  Medications   omeprazole  (PRILOSEC) 20 MG capsule    Sig: Take 1 capsule (20 mg total) by mouth 2 (two) times daily before a meal.    Dispense:  180 capsule    Refill:  3    Follow up plan: Return if symptoms worsen or fail to improve.   Domingo Friend, DO [facility] 04/24/2024, 2:16 PM

## 2024-04-24 NOTE — Patient Instructions (Addendum)
 Thank you for coming to the office today.  Brandi Inks, MD Infectious Disease Clinic at [facility] [address] [place], [place] [address] [phone]  Referral sent to Infection Disease  Ask about Lyme or other tick testing.  Future Chest X-ray 2-3+ weeks to re-evaluate lungs / pneumonia   Consider future Long COVID or / Post COVID Chronic clinic for lungs, I know of the one at [facility], may be others available.  Please schedule a Follow-up Appointment to: Return if symptoms worsen or fail to improve.  If you have any other questions or concerns, please feel free to call the office or send a message through MyChart. You may also schedule an earlier appointment if necessary.  Additionally, you may be receiving a survey about your experience at our office within a few days to 1 week by e-mail or mail. We value your feedback.  Domingo Friend, DO [facility] [place]

## 2024-04-27 ENCOUNTER — Ambulatory Visit (HOSPITAL_BASED_OUTPATIENT_CLINIC_OR_DEPARTMENT_OTHER): Admitting: Infectious Diseases

## 2024-04-27 ENCOUNTER — Ambulatory Visit: Payer: Self-pay | Admitting: Pulmonary Disease

## 2024-04-27 ENCOUNTER — Telehealth: Payer: Self-pay

## 2024-04-27 ENCOUNTER — Encounter: Payer: Self-pay | Admitting: Infectious Diseases

## 2024-04-27 VITALS — BP 129/78 | HR 105 | Temp 97.0°F | Ht 64.0 in | Wt 123.0 lb

## 2024-04-27 DIAGNOSIS — Z008 Encounter for other general examination: Secondary | ICD-10-CM | POA: Diagnosis not present

## 2024-04-27 DIAGNOSIS — Z711 Person with feared health complaint in whom no diagnosis is made: Secondary | ICD-10-CM

## 2024-04-27 DIAGNOSIS — R5383 Other fatigue: Secondary | ICD-10-CM | POA: Diagnosis not present

## 2024-04-27 DIAGNOSIS — R0600 Dyspnea, unspecified: Secondary | ICD-10-CM | POA: Diagnosis not present

## 2024-04-27 NOTE — Telephone Encounter (Signed)
 Copied from CRM [id number]. Topic: General - Other >> Apr 27, 2024  2:23 PM Sophia H wrote: Reason for CRM: Pt requesting a callback from Dr. Linnell Richardson regarding her referral, decl to provide me any other information. Best contact # [phone]

## 2024-04-27 NOTE — Telephone Encounter (Addendum)
 I did speak with the patient. She was upset that she was not able to be seen today. She said she does not know if she will be able to get a ride to her appt tomorrow. She did say she was recently diagnosed with pneumonia and has finished her antibiotics. I did tell her I would let Dr. Josephine Nicolas know and we would see her tomorrow.   Dr. Crissie Dome- Just an FYI.  Nothing further needed.

## 2024-04-27 NOTE — Telephone Encounter (Signed)
 I am aware of this situation. We referred her to Infectious Disease for evaluation of recurrent pneumonia in setting of severe COPD. It sounds like based on notes and report given to me that the patient presented to ID to discuss possible lung parasitic infection. This is not the scope of what the referral was for. ID did not keep her as an actual apt consultation, they advised that was not something they test for.  I spoke to Dr Francee Inch on [employer] today. She updated me on the situation. She recommended that given normal eosinophils unlikely to have parasitic lung infection. Also she advised if recurrent pneumonia is concern, we could check Immunoglobulin IgG, IgM, IgA levels, and sputum culture and procalcitonin lab.  Patient has apt tomorrow with Dr Vannie Gentile Pulmonology on 04/28/24. Again the concern is that the patient remains very anxious about her respiratory symptoms and she has been given extensive diagnostic testing and treatment and patient education.  Domingo Friend, DO [facility] 04/27/2024, 3:18 PM

## 2024-04-27 NOTE — Patient Instructions (Signed)
 You are asking me to test for parasites, as you think you may have one-that is causing your lung condition- COPD  Your last CBC  on 2/25 was normal with no eosinophilia and Ithat is reassuring .  please follow up with your PCP and pulmonologist and if they think you have one they can test - I am canceling this appt with me and you will not be charged

## 2024-04-27 NOTE — Progress Notes (Signed)
 PT is here asking to be tested for parasites.  She is showing me videos of parasites on the internet I told her I dont test for parasites- Reassured her last eosinophil count was withing normal range

## 2024-04-27 NOTE — Telephone Encounter (Signed)
 Per infectious disease physician the patient was not in distress.  She wanted to be seen in pulmonary because she was in the same building and thought that she could be seen as a work in appointment.  Our schedule was already full for the morning and also with several acute visits as well.  The patient has a scheduled appointment (previously made) tomorrow 5/23.  She was advised to keep that appointment.

## 2024-04-27 NOTE — Telephone Encounter (Addendum)
 Copied from CRM [id number]. Topic: Clinical - Red Word Triage >> Apr 27, 2024 10:49 AM Ambrose Junk wrote: [facility] that prompted transfer to Nurse Triage: Breathing problems  Per agent, pt is currently sitting at infectious disease appt in the same building as pulm, demanding to see Dr. Viva Grise. Agent called [facility] they said "absolutely not" able to accommodate appt today, they made appt for pt tomorrow. Per agent, pt saying she needs appt today, very insistent, agent called back to [facility], [facility] still stating "absolutely not." Per agent, pt stating, "what if I wheel myself in that office, in wheelchair, on oxygen , can't come back tomorrow, can't do it, can't do it." Per agent, office won't talk to her and they don't do nurse visits.  TRIAGE SUMMARY NOTE: Pt reporting she is having SOB while on oxygen , pt talking in full sentences, pt stated that she needed assistance to get into the building, potential weakness. Pt requesting to be seen urgently and for pulm doc to squeeze her in. Advised that pt have infectious disease doc examine her since in that appt now, not yet been seen by doc. Advised that pulm office does not have the availability for appt today, want her to get the best care which would devote more than 10 min. Advised that with the potential urgency of her situation, including SOB and weakness, nurse advising pt to seek emergent care in calling 911 or getting assistance going down to the ED in the building, advised 911 with her symptoms per her urgency even if were sitting in appt with pulm, as there are limited options for interventions in office if situation is emergent. Throughout the phone call, pt stating she is unsure if she can make appt tomorrow, stating "she can take 10 min and that's not too much to ask for a patient," pt stating that doc "has time to go to bathroom and eat lunch" and so should have time to see her. Advised pt call 911 for urgency of her situation especially if any worsening of  symptoms. Pt is upset, pt hung up.  E2C2 Pulmonary Triage - Initial Assessment Questions "Chief Complaint (e.g., cough, sob, wheezing, fever, chills, sweat or additional symptoms) *Go to specific symptom protocol after initial questions. Ain't able to come tomorrow Call 911 Y'all ain't worth a hill of beans She can take 10 min and that's not too much to ask for a patient Ain't right Y'all not no good to me Don't worse I'm begging and it's not working Don't know if I can get there tomorrow Will leave a good review If I'm dying I'll let them know I ain't got time today  Reason for Disposition  Patient sounds very sick or weak to the triager  Answer Assessment - Initial Assessment Questions 1. SITUATION:  Document reason for call.     Pt having SOB on oxygen  2. BACKGROUND: Document any background information (e.g., prior calls, known psychiatric history)     Per agent, pt is currently sitting at infectious disease appt in the same building as pulm, demanding to see Dr. Viva Grise. Agent called [facility] they said "absolutely not" able to accommodate appt today, they made appt for pt tomorrow. Per agent, pt saying she needs appt today, very insistent, agent called back to [facility], CAL still stating "absolutely not." Per agent, pt stating, "what if I wheel myself in that office, in wheelchair, on oxygen , can't come back tomorrow, can't do it, can't do it." Per agent, office won't talk to her and  they don't do nurse visits. 3. ASSESSMENT: Document your nursing assessment.     Pt reporting she is having SOB while on oxygen , pt talking in full sentences, pt stated that she needed assistance to get into the building, potential weakness. Pt requesting to be seen urgently and for pulm doc to squeeze her in. 4. RESPONSE: Document what your response or recommendation was.     Advised that pt have infectious disease doc examine her since in that appt now, not yet been seen by doc. Advised that pulm office does not  have the availability for appt today, want her to get the best care which would devote more then 10 min. Advised that with the urgency of her situation, including SOB and weakness, nurse advising pt to seek emergent care in calling 911 or getting assistance going down to the ED in the building, advised 911 with her symptoms per her urgency even if were sitting in appt with pulm, limited options for interventions in office if situation emergent.  Protocols used: Difficult Call-A-AH

## 2024-04-28 ENCOUNTER — Ambulatory Visit
Admission: RE | Admit: 2024-04-28 | Discharge: 2024-04-28 | Disposition: A | Discharge status: Home / Self Care | Source: Ambulatory Visit | Attending: Pulmonary Disease | Admitting: Pulmonary Disease

## 2024-04-28 ENCOUNTER — Encounter: Payer: Self-pay | Admitting: Pulmonary Disease

## 2024-04-28 ENCOUNTER — Other Ambulatory Visit
Admission: RE | Admit: 2024-04-28 | Discharge: 2024-04-28 | Disposition: A | Discharge status: Home / Self Care | Source: Ambulatory Visit | Attending: Pulmonary Disease | Admitting: Pulmonary Disease

## 2024-04-28 ENCOUNTER — Telehealth: Payer: Self-pay

## 2024-04-28 ENCOUNTER — Ambulatory Visit: Admitting: Pulmonary Disease

## 2024-04-28 VITALS — BP 118/78 | HR 96 | Temp 97.6°F | Wt 124.0 lb

## 2024-04-28 DIAGNOSIS — J189 Pneumonia, unspecified organism: Secondary | ICD-10-CM | POA: Diagnosis not present

## 2024-04-28 DIAGNOSIS — J449 Chronic obstructive pulmonary disease, unspecified: Secondary | ICD-10-CM | POA: Diagnosis not present

## 2024-04-28 DIAGNOSIS — J9611 Chronic respiratory failure with hypoxia: Secondary | ICD-10-CM

## 2024-04-28 DIAGNOSIS — R918 Other nonspecific abnormal finding of lung field: Secondary | ICD-10-CM | POA: Diagnosis not present

## 2024-04-28 LAB — CBC WITH DIFFERENTIAL/PLATELET
Abs Immature Granulocytes: 0.04 10*3/uL (ref 0.00–0.07)
Basophils Absolute: 0.1 10*3/uL (ref 0.0–0.1)
Basophils Relative: 2 %
Eosinophils Absolute: 0.5 10*3/uL (ref 0.0–0.5)
Eosinophils Relative: 8 %
HCT: 39.4 % (ref 36.0–46.0)
Hemoglobin: 13.2 g/dL (ref 12.0–15.0)
Immature Granulocytes: 1 %
Lymphocytes Relative: 29 %
Lymphs Abs: 1.8 10*3/uL (ref 0.7–4.0)
MCH: 32.2 pg (ref 26.0–34.0)
MCHC: 33.5 g/dL (ref 30.0–36.0)
MCV: 96.1 fL (ref 80.0–100.0)
Monocytes Absolute: 0.6 10*3/uL (ref 0.1–1.0)
Monocytes Relative: 9 %
Neutro Abs: 3.3 10*3/uL (ref 1.7–7.7)
Neutrophils Relative %: 51 %
Platelets: 280 10*3/uL (ref 150–400)
RBC: 4.1 MIL/uL (ref 3.87–5.11)
RDW: 12 % (ref 11.5–15.5)
WBC: 6.3 10*3/uL (ref 4.0–10.5)
nRBC: 0 % (ref 0.0–0.2)

## 2024-04-28 MED ORDER — AZITHROMYCIN 250 MG PO TABS: 250.0000 mg | ORAL_TABLET | ORAL | 2 refills | Status: DC

## 2024-04-28 MED ORDER — OHTUVAYRE 3 MG/2.5ML IN SUSP: 3.0000 mg | Freq: Two times a day (BID) | RESPIRATORY_TRACT | 11 refills | Status: DC

## 2024-04-28 MED ORDER — PREDNISONE 10 MG (21) PO TBPK: ORAL_TABLET | ORAL | 0 refills | Status: DC

## 2024-04-28 NOTE — Patient Instructions (Signed)
 VISIT SUMMARY:  During today's visit, we discussed your ongoing respiratory issues, including your severe COPD, chronic respiratory failure, and recent pneumonia. We reviewed your current medications and addressed your concerns about persistent shortness of breath and physical weakness. We also talked about potential new treatments and the challenges with insurance coverage for pulmonary rehabilitation.  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 We will order a chest x-ray to check your current lung status and prescribe azithromycin  on Monday, Wednesday, and Friday for its anti-inflammatory effects. Additionally, you will start a prednisone  taper to help with congestion. We will also discuss the potential use of Dupixent, pending the results of your blood work. Please explore options for pulmonary rehabilitation with your insurance provider.  -CHRONIC RESPIRATORY FAILURE WITH HYPOXIA: Chronic respiratory failure with hypoxia means your lungs are not getting enough oxygen  into your blood due to severe COPD and emphysema. There are no acute changes in your hypoxia status at this time.  -ASTHMA: Asthma is a condition that causes your airways to narrow and swell, making it hard to breathe. It may overlap with your COPD symptoms. We discussed the potential use of Dupixent for managing your asthma, pending the results of your blood work. Dupixent can be self-administered or given at a local infusion center.  INSTRUCTIONS:  Please complete the blood work as ordered to assess your eligibility for Dupixent. We will also schedule a chest x-ray to evaluate your current lung status. Continue taking your medications as prescribed, and start the azithromycin  and prednisone  as directed. Explore options for pulmonary rehabilitation with your insurance provider. Follow up with us  after completing these  steps or if you experience any worsening symptoms.

## 2024-04-28 NOTE — Progress Notes (Signed)
 Subjective:    Patient ID: Brandi [name], female    DOB: [birth year], 68 y.o.   MRN: [id number]  Patient Care Team: Raina Bunting, DO as PCP - General (Family Medicine) Marc Senior, MD as Consulting Physician (Pulmonary Disease) Roxie Cord, RN as [facility] Care Management Roxie Cord, RN  Chief Complaint  Patient presents with   Follow-up    Cough, shortness of breath on exertion and wheezing.    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  Last evaluated here on 07 March 2024.   HPI Discussed the use of AI scribe software for clinical note transcription with the patient, who gave verbal consent to proceed.  History of Present Illness   Brandi [name] is a 68 year old female with severe COPD and chronic respiratory failure who presents with persistent shortness of breath following a recent pneumonia diagnosis.  She was recently diagnosed with pneumonia, presumably confirmed by an x-ray conducted by visiting nurses, film not available for my review. Despite completing a seven-day course of antibiotics, she continues to experience persistent shortness of breath for over a week. No fever is present, but she has a consistently dry mouth.  There has been no sputum production.    She has a history of severe COPD and chronic respiratory failure, requiring continuous oxygen  therapy. Her current medications include Ohtuvayre .  And Breztri , and she needs a refill for Ohtuvayre . She has also completed a recent pulmonary function test on 30 April that showed very severe COPD with reversible airway obstruction component and significant decline from prior PFT performed June 2024.  Her FEV1 is 0.75 L or 32% of predicted with a 38% change postbronchodilator resulting in an FEV1 of 1.04 L or 44% predicted.  Diffusion capacity is severely impaired.  No reflux or sour  taste in the morning. She has had her gallbladder removed in the past and recalls a previous adverse reaction to the antibiotic Levaquin , which led her to adjust the dosage herself.  Her insurance does not cover in home pulmonary rehabilitation program, which has hindered her participation. She is concerned about her physical strength, noting significant weakness and inability to stand during a recent illness.   She has been erratic with regards to her oxygen  use.     DATA 04/09/2021 LDCT: Moderate to severe paraseptal and centrilobular emphysema.  Left upper lobe nodule measuring 2 mm (decreased from prior), compression fractures T7 and T9 noted previously and unchanged.  Coronary artery calcifications. 07/04/2021 alpha-1 antitrypsin: Phenotype MM, level 140 mg/dL (normal) 08/65/7846 overnight oximetry: Baseline O2 sat 90% during sleep with desaturations as low as 83% during the night desaturation events lasted for over an hour. 08/14/2021 PFTs: FEV1 1.13 L or 46% predicted, FVC 2.42 L or 76% predicted, FEV1/FVC 47%, there is a mild response to bronchodilator with a 10% net change.  There is hyperinflation with TLC at 128% and air trapping with RV at 188%.  Diffusion capacity is moderately to severely reduced by Kco.  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 08/14/2021 echocardiogram: LVEF 60 to 65%, grade 1 DD 12/04/2021 chest x-ray: Emphysema with no evidence of acute cardiopulmonary disease 12/24/2021 overnight oximetry: Baseline O2 sat 90% desaturations as low as 83%.   12/26/2021 nuclear medicine stress test: No significant ischemia, normal wall motion, EF estimated at 77%, coronary calcification in the LAD, mild aortic atherosclerosis 01/05/2023 chest x-ray PA  and lateral: Infiltrates in the right middle lobe favor to be infectious/inflammatory 02/23/2023 chest x-ray PA and lateral: Improved though incompletely resolved middle lobe infection, minimal residual,  emphysema 05/25/2023 CT chest without contrast: No acute cardiopulmonary disease, moderate to severe centrilobular emphysematous disease without significant change.  Borderline cardiomegaly unchanged.  Atherosclerotic coronary artery disease.  Stable mild superior endplate compression deformity mid thoracic spine. 05/25/2023 PFTs: FEV1 1.0 L or 42% predicted, FVC 2.44 L or 78% predicted, FEV1/FVC 41%, there is a reversible component with FEV1 improvement by 17% after bronchodilator.  There is hyperinflation and air trapping.  Diffusion capacity severely impaired, this is consistent with emphysema and reversible airways component.  Lung function has shown modest decrease from prior. 01/31/2024 CXR PA and lateral: Significant COPD/emphysema changes, no active cardiopulmonary disease.  Flattening of the diaphragms noted in keeping with severe COPD. 02/04/2024 alpha-1 antitrypsin: Verified MM, level 167 mg/dL (normal) 19/14/7829 PFTs: FEV1 0.75 L or 32% predicted, FVC 1.91 L or 62% predicted, FEV1/FVC 39%, post bronchodilator FEV1 1.04 L or 44% predicted for a net change of 38%.  Indicating airway reversibility.  Lung volumes show hyperinflation and air trapping.  Diffusion capacity is severely impaired, compared to study performed 25 May 2023 there has been significant decline in FEV1.  This is consistent with severe obstructive airways disease emphysematous type with reversible airway obstruction component.  Review of Systems A 10 point review of systems was performed and it is as noted above otherwise negative.   Patient Active Problem List   Diagnosis Date Noted   Osteoporosis 11/29/2023   Rash and nonspecific skin eruption 05/22/2023   Facial tingling sensation 05/22/2023   Pre-diabetes 04/08/2021   Vitamin D  deficiency 04/08/2021   COVID-19 vaccination declined 12/04/2020   Centrilobular emphysema (HCC) 04/08/2020   Nodule of upper lobe of left lung 04/08/2020   Aortic atherosclerosis (HCC)  05/21/2019   Coronary artery calcification seen on CT scan 05/21/2019   Former smoker 05/21/2019   Hyperlipidemia 05/21/2019    Social History   Tobacco Use   Smoking status: Former    Current packs/day: 0.00    Average packs/day: 0.8 packs/day for 41.0 years (30.8 ttl pk-yrs)    Types: Cigarettes    Start date: 12/07/1974    Quit date: 12/08/2015    Years since quitting: 8.3   Smokeless tobacco: Never  Substance Use Topics   Alcohol use: No    No Known Allergies  Current Meds  Medication Sig   albuterol  (PROVENTIL ) (2.5 MG/3ML) 0.083% nebulizer solution Take 3 mLs (2.5 mg total) by nebulization every 6 (six) hours as needed for wheezing or shortness of breath.   albuterol  (VENTOLIN  HFA) 108 (90 Base) MCG/ACT inhaler Inhale 2 puffs into the lungs every 4 (four) hours as needed for wheezing or shortness of breath.   alendronate  (FOSAMAX ) 70 MG tablet Take 1 tablet (70 mg total) by mouth once a week.   aspirin  EC 81 MG tablet Take 1 tablet (81 mg total) by mouth daily.   azithromycin  (ZITHROMAX ) 250 MG tablet Take 1 tablet (250 mg total) by mouth 3 (three) times a week. M-W-F   Budeson-Glycopyrrol-Formoterol (BREZTRI  AEROSPHERE) 160-9-4.8 MCG/ACT AERO Inhale 2 puffs into the lungs in the morning and at bedtime.   busPIRone  (BUSPAR ) 5 MG tablet TAKE 1 TABLET BY MOUTH TWICE DAILY AS NEEDED FOR ANXIETY   Calcium  Carbonate-Vit D-Min (CALCIUM  1200) 1200-1000 MG-UNIT CHEW Chew 1,200 mg by mouth daily.   Cholecalciferol (VITAMIN D3) 125 MCG (5000 UT) TABS  Take by mouth. Vitamin D3 5,000 IU daily for 12 weeks then reduce to OTC Vitamin D3 2,000 IU daily for maintenance   ezetimibe  (ZETIA ) 10 MG tablet Take 1 tablet (10 mg total) by mouth daily.   famotidine  (PEPCID ) 20 MG tablet Take 1 tablet (20 mg total) by mouth 2 (two) times daily as needed.   feeding supplement (ENSURE ENLIVE / ENSURE PLUS) LIQD Take 237 mLs by mouth 2 (two) times daily between meals.   montelukast  (SINGULAIR ) 10 MG  tablet TAKE 1 TABLET BY MOUTH AT BEDTIME   Multiple Vitamin (MULTIVITAMIN WITH MINERALS) TABS tablet Take 1 tablet by mouth daily.   naproxen  (NAPROSYN ) 500 MG tablet Take 1 tablet (500 mg total) by mouth 2 (two) times daily with a meal. For 1-2 weeks then as needed   omeprazole  (PRILOSEC) 20 MG capsule Take 1 capsule (20 mg total) by mouth 2 (two) times daily before a meal.   OXYGEN  Inhale 3 L into the lungs at bedtime as needed.   predniSONE  (STERAPRED UNI-PAK 21 TAB) 10 MG (21) TBPK tablet Take as directed on the package.   rosuvastatin  (CRESTOR ) 10 MG tablet Take 1 tablet (10 mg total) by mouth daily.   sodium chloride  (OCEAN) 0.65 % SOLN nasal spray Place 1 spray into both nostrils as needed for congestion.   Specialty Vitamins Products (HAIR NOURISHING SUPPLEMENT PO) Take 1 tablet by mouth daily.   tiZANidine  (ZANAFLEX ) 2 MG tablet Take 1 tablet (2 mg total) by mouth every 8 (eight) hours as needed for muscle spasms.   triamcinolone  cream (KENALOG ) 0.5 % Apply 1 Application topically 2 (two) times daily as needed (allergic reaction on skin swelling). To affected areas, for up to 2 weeks.   [DISCONTINUED] OHTUVAYRE  3 MG/2.5ML SUSP Inhale 3 mg into the lungs 2 (two) times daily.    Immunization History  Administered Date(s) Administered   Moderna Sars-Covid-2 Vaccination 05/26/2021, 06/24/2021        Objective:     BP 118/78 (BP Location: Left Arm, Patient Position: Sitting, Cuff Size: Normal)   Pulse 96   Temp 97.6 F (36.4 C) (Temporal)   Wt 124 lb (56.2 kg)   SpO2 94%   BMI 21.28 kg/m   SpO2: 94 %  GENERAL: Well-developed, well-nourished woman, no acute distress.  Presents in transport chair due to dyspnea.  Uncomfortable but no tachypnea, no conversational dyspnea.  Not using POC. HEAD: Normocephalic, atraumatic.  EYES: Pupils equal, round, reactive to light.  No scleral icterus.  MOUTH: Poor dentition. She does have visible nasal septal deviation.  Oral mucosa moist, no  thrush. NECK: Supple. No thyromegaly. Trachea midline. No JVD.  No adenopathy. PULMONARY: Distant breath sounds, fair air movement. Coarse, otherwise no adventitious sounds. CARDIOVASCULAR: S1 and S2. Regular rate and rhythm.  No rubs, murmurs or gallops heard. ABDOMEN: Benign. MUSCULOSKELETAL: No joint deformity, no clubbing, no edema.  NEUROLOGIC: No focal deficit, speech is fluent.  Unsteady gait due to generalized weakness. SKIN: Intact,warm,dry. PSYCH: Mood and behavior normal.      Assessment & Plan:     ICD-10-CM   1. Stage 3 severe COPD by GOLD classification (HCC)  J44.9     2. Chronic respiratory failure with hypoxia (HCC)  J96.11     3. Recurrent pneumonia  J18.9 DG Chest 2 View    CBC with Differential/Platelet    Immunoglobulins, QN, A/E/G/M      Orders Placed This Encounter  Procedures   DG Chest 2 View  Standing Status:   Future    Number of Occurrences:   1    Expected Date:   04/28/2024    Expiration Date:   04/28/2025    Reason for Exam (SYMPTOM  OR DIAGNOSIS REQUIRED):   Recent PNA, COPD exacerbation, Shortness of breath    Preferred imaging location?:   [facility]   CBC with Differential/Platelet    Standing Status:   Future    Number of Occurrences:   1    Expected Date:   04/28/2024    Expiration Date:   04/28/2025   Immunoglobulins, QN, A/E/G/M    Standing Status:   Future    Number of Occurrences:   1    Expiration Date:   04/28/2025    Meds ordered this encounter  Medications   azithromycin  (ZITHROMAX ) 250 MG tablet    Sig: Take 1 tablet (250 mg total) by mouth 3 (three) times a week. M-W-F    Dispense:  12 each    Refill:  2   predniSONE  (STERAPRED UNI-PAK 21 TAB) 10 MG (21) TBPK tablet    Sig: Take as directed on the package.    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 Recent deterioration in lung function indicated by  breathing test. Reports recent pneumonia diagnosis via home x-ray, but no report available. Persistent congestion and dyspnea. Emphysema significantly impacts respiratory symptoms. Insurance coverage issues for pulmonary rehabilitation; advised to explore options with [facility]. - Order chest x-ray to assess current lung status - Prescribe azithromycin  on Monday, Wednesday, and Friday for anti-inflammatory effects - Prescribe prednisone  taper for residual inflammation/congestion - Discuss potential use of Dupixent pending blood work results - Order blood work to assess immune function and eligibility for [employer] - Advise exploring options for pulmonary rehabilitation due to insurance coverage issues  Chronic respiratory failure with hypoxia Chronic respiratory failure with hypoxia secondary to severe COPD and emphysema. Oxygen  dependency. No acute changes in hypoxia status.  Patient has been erratic with regards to oxygen  use.  Encouraged use of oxygen  as prescribed.  Asthma Asthma contributing to overall respiratory condition with potential overlap with COPD symptoms. Discussed potential use of Dupixent for asthma management pending blood work results. Dupixent administration options include self-administration or at a local infusion center, contingent on blood work results. - Order blood work to assess eligibility for Dupixent - Discuss Dupixent as a treatment option for asthma with COPD, pending blood work results      Advised if symptoms do not improve or worsen, to please contact office for sooner follow up or seek emergency care.    I spent 40 minutes of dedicated to the care of this patient on the date of this encounter to include pre-visit review of records, face-to-face time with the patient discussing conditions above, post visit ordering of testing, clinical documentation with the electronic health record, making appropriate referrals as documented, and communicating necessary  findings to members of the patients care team.     C. Chloe Counter, MD Advanced Bronchoscopy PCCM  Pulmonary-[place]    *This note was generated using voice recognition software/Dragon and/or AI transcription program.  Despite best efforts to proofread, errors can occur which can change the meaning. Any transcriptional errors that result from this process are unintentional and may not be fully corrected at the time of dictation.

## 2024-04-28 NOTE — Telephone Encounter (Signed)
 Refill for Ohtuvayre  sent to [facility] pharmacy  Geraldene Kleine, PharmD, MPH, BCPS, CPP Clinical Pharmacist (Rheumatology and Pulmonology)

## 2024-04-28 NOTE — Telephone Encounter (Signed)
 Patient requesting refill on OHTUVAYRE .

## 2024-05-02 ENCOUNTER — Ambulatory Visit: Payer: Self-pay | Admitting: Pulmonary Disease

## 2024-05-02 LAB — IMMUNOGLOBULINS A/E/G/M, SERUM
IgA: 86 mg/dL — ABNORMAL LOW (ref 87–352)
IgE (Immunoglobulin E), Serum: 356 [IU]/mL (ref 6–495)
IgG (Immunoglobin G), Serum: 826 mg/dL (ref 586–1602)
IgM (Immunoglobulin M), Srm: 34 mg/dL (ref 26–217)

## 2024-05-04 ENCOUNTER — Telehealth: Payer: Self-pay

## 2024-05-04 NOTE — Telephone Encounter (Signed)
 I have notified the patient. Nothing further needed.

## 2024-05-04 NOTE — Telephone Encounter (Signed)
  Result Notes   Marc Senior, MD 05/02/2024 10:26 AM EDT Back to Top    Overall her immune system numbers look good.    I have notified the patient. She is asking if you have any suggestions on what she should keep doing to keep her immune system looking good?

## 2024-05-04 NOTE — Telephone Encounter (Signed)
 Copied from CRM [id number]. Topic: Clinical - Lab/Test Results >> May 04, 2024 11:33 AM Eveleen Hinds B wrote: Reason for CRM:  Patient would like a call back to walk through her labs results. Please call.

## 2024-05-04 NOTE — Telephone Encounter (Signed)
 Maintaining a healthy diet is the best thing for maintaining a healthy immune system.  She can ask her primary care physician for examples of healthy diets.

## 2024-05-08 DIAGNOSIS — J449 Chronic obstructive pulmonary disease, unspecified: Secondary | ICD-10-CM | POA: Diagnosis not present

## 2024-05-08 DIAGNOSIS — Z008 Encounter for other general examination: Secondary | ICD-10-CM | POA: Diagnosis not present

## 2024-05-09 ENCOUNTER — Other Ambulatory Visit: Payer: Self-pay

## 2024-05-09 NOTE — Patient Outreach (Unsigned)
 Complex Care Management   Visit Note  05/09/2024  Name:  Brandi [name] MRN: [id number] DOB: 12/21/1955  Situation: Referral received for Complex Care Management related to {Criteria:32550} I obtained verbal consent from {CHL AMB Patient/Caregiver:28184}.  Visit completed with ***  {VISIT LOCATION:32553}  Background:   Past Medical History:  Diagnosis Date   COPD (chronic obstructive pulmonary disease) (HCC)    Hyperlipidemia     Assessment: Patient Reported Symptoms:  Cognitive        Neurological      HEENT        Cardiovascular   Weight: 120 lb (54.4 kg)  Respiratory      Endocrine      Gastrointestinal        Genitourinary      Integumentary      Musculoskeletal          Psychosocial       Quality of Family Relationships: supportive Do you feel physically threatened by others?: No      05/09/2024    4:39 PM  Depression screen PHQ 2/9  Decreased Interest 0  Down, Depressed, Hopeless 0  PHQ - 2 Score 0    Vitals:   05/09/24 1614  SpO2: 94%    Medications Reviewed Today     Reviewed by Roxie Cord, RN (Registered Nurse) on 05/09/24 at 1514  Med List Status: <None>   Medication Order Taking? Sig Documenting Provider Last Dose Status Informant  albuterol  (PROVENTIL ) (2.5 MG/3ML) 0.083% nebulizer solution [id number]  Take 3 mLs (2.5 mg total) by nebulization every 6 (six) hours as needed for wheezing or shortness of breath. Marc Senior, MD  Active   albuterol  (VENTOLIN  HFA) 108 [id number] Base) MCG/ACT inhaler [id number]  Inhale 2 puffs into the lungs every 4 (four) hours as needed for wheezing or shortness of breath. Raina Bunting, DO  Active   alendronate  (FOSAMAX ) 70 MG tablet [id number]  Take 1 tablet (70 mg total) by mouth once a week. Raina Bunting, DO  Active   aspirin  EC 81 MG tablet [id number]  Take 1 tablet (81 mg total) by mouth daily. Gollan, [name], MD  Active Self  azithromycin  (ZITHROMAX ) 250 MG tablet  [id number]  Take 1 tablet (250 mg total) by mouth 3 (three) times a week. M-W-F Marc Senior, MD  Active   Budeson-Glycopyrrol-Formoterol (BREZTRI  AEROSPHERE) 160-9-4.8 MCG/ACT Sudie Ely [id number]  Inhale 2 puffs into the lungs in the morning and at bedtime. Marc Senior, MD  Active   busPIRone  (BUSPAR ) 5 MG tablet [id number]  TAKE 1 TABLET BY MOUTH TWICE DAILY AS NEEDED FOR ANXIETY [name], Kayleen Party, DO  Active   Calcium  Carbonate-Vit D-Min (CALCIUM  1200) 1200-1000 MG-UNIT CHEW [id number]  Chew 1,200 mg by mouth daily. [name], [name], MD  Active   Cholecalciferol (VITAMIN D3) 125 MCG (5000 UT) TABS [id number]  Take by mouth. Vitamin D3 5,000 IU daily for 12 weeks then reduce to OTC Vitamin D3 2,000 IU daily for maintenance [provider]  Active   ezetimibe  (ZETIA ) 10 MG tablet [id number]  Take 1 tablet (10 mg total) by mouth daily. Gollan, [name], MD  Active   famotidine  (PEPCID ) 20 MG tablet [id number]  Take 1 tablet (20 mg total) by mouth 2 (two) times daily as needed. [name], Kayleen Party, DO  Active   feeding supplement (ENSURE ENLIVE / ENSURE PLUS) LIQD [id number]  Take 237 mLs by mouth 2 (two) times daily between meals. Montey Apa, DO  Active  montelukast  (SINGULAIR ) 10 MG tablet [id number]  TAKE 1 TABLET BY MOUTH AT BEDTIME [name], Kayleen Party, DO  Active   Multiple Vitamin (MULTIVITAMIN WITH MINERALS) TABS tablet [id number]  Take 1 tablet by mouth daily. Montey Apa, DO  Active            Med Note Dessa Floss, Azariya Freeman N   Wed Nov 17, 2023  2:48 PM) Reports not taking daily  naproxen  (NAPROSYN ) 500 MG tablet [id number]  Take 1 tablet (500 mg total) by mouth 2 (two) times daily with a meal. For 1-2 weeks then as needed Raina Bunting, DO  Active   OHTUVAYRE  3 MG/2.5ML SUSP [id number]  Inhale 3 mg into the lungs 2 (two) times daily. Marc Senior, MD  Active   omeprazole  (PRILOSEC) 20 MG capsule [id number]  Take 1 capsule (20 mg total) by  mouth 2 (two) times daily before a meal. Raina Bunting, DO  Active   OXYGEN  [id number]  Inhale 3 L into the lungs at bedtime as needed. [provider]  Active            Med Note Dessa Floss, Illana Nolting N   Wed Nov 17, 2023  2:51 PM) Reports currently using 4 min/l  predniSONE  (STERAPRED UNI-PAK 21 TAB) 10 MG (21) TBPK tablet [id number]  Take as directed on the package. Marc Senior, MD  Active   rosuvastatin  (CRESTOR ) 10 MG tablet [id number]  Take 1 tablet (10 mg total) by mouth daily. Gollan, [name], MD  Active            Med Note Dessa Floss, Caridad Chard Nov 17, 2023  2:49 PM) Reports not taking daily. Reports taking a few times a week at night.  sodium chloride  (OCEAN) 0.65 % SOLN nasal spray [id number]  Place 1 spray into both nostrils as needed for congestion. [provider]  Active   Specialty Vitamins Products (HAIR NOURISHING SUPPLEMENT PO) [id number]  Take 1 tablet by mouth daily. [provider]  Active   tiZANidine  (ZANAFLEX ) 2 MG tablet [id number]  Take 1 tablet (2 mg total) by mouth every 8 (eight) hours as needed for muscle spasms. Raina Bunting, DO  Active   triamcinolone  cream (KENALOG ) 0.5 % [id number]  Apply 1 Application topically 2 (two) times daily as needed (allergic reaction on skin swelling). To affected areas, for up to 2 weeks. Raina Bunting, DO  Active             Recommendation:   {RECOMMENDATONS:32554}  Follow Up Plan:   {FOLLOWUP:32559}  SIG ***

## 2024-05-09 NOTE — Patient Instructions (Signed)
Thank you for allowing the Chronic Care Management team to participate in your care.  

## 2024-05-11 ENCOUNTER — Ambulatory Visit: Payer: Self-pay | Admitting: Pulmonary Disease

## 2024-05-11 ENCOUNTER — Telehealth: Payer: Self-pay

## 2024-05-11 NOTE — Telephone Encounter (Signed)
 I notified the patient. She is refusing to go to the ED for her chest pain and HTN. She said her BP is high because she can not get anything done about her antibiotics. She is wanting more Azithromycin . She said she has been taking it 1-2 tablets a day because she feels like she can not get anything up.   Per Dr. Viva Grise- she will not prescribe her anymore Azithromycin  and she should not take it twice a day. She could develop C Diff.   I notified the patient. She did get upset because Dr. Viva Grise will not give her anymore antibiotics. She asked if Dr. Viva Grise will see her tomorrow. I told her Dr. Viva Grise did not have any openings tomorrow. She was upset about that as well.  I asked if she has reached out to her PCP. She said they will probably not be able to see her either. I again told her Dr. Viva Grise recommends she be seen at the ED for the chest pain and hypertension.  She thanked me and hung up.  Nothing further needed.

## 2024-05-11 NOTE — Telephone Encounter (Signed)
 The symptoms are described need to be evaluated in the emergency room.  She cannot have any more antibiotics than have been prescribed.  Hypertension chest pain may be indicative of a cardiac issue.  Recommend the emergency room evaluation or evaluation by primary care.

## 2024-05-11 NOTE — Telephone Encounter (Signed)
 Copied from CRM [id number]. Topic: Clinical - Medical Advice >> May 11, 2024  2:54 PM Tyronne Galloway wrote: Reason for CRM: Pt is taking azithromycin  (ZITHROMAX ) 250 MG tablet, however, pt stated she went to get a refill (medication has 2x refills) and the pharmacty state she needed to wait until 6/13. Pt would like to see if Dr. Viva Grise can send in a strong prescription to last her longer as she still is coughing yellow phlegm and has congestion. Pt's phone number is [phone] ok to leave a vm. Pt is requesting the prescription to be sent over to [facility]. Pt stated she wants this medication before Friday 05/12/2024.

## 2024-05-11 NOTE — Telephone Encounter (Signed)
 She is supposed to take the Azithromycin  only Monday Wednesday and Friday 1 tablet.  She will continue to have issues with the cough for now.  She is on maximum medication for her COPD.  The Azithromycin  is to prevent this but it does take time for it to work but she needs to stick to a Monday Wednesday and Friday schedule.  She was given a prescription to last her a month under this schedule.

## 2024-05-11 NOTE — Telephone Encounter (Signed)
 No further action needed at this time. I see where a message was sent to Dr. Viva [name] and patient was advised.

## 2024-05-11 NOTE — Telephone Encounter (Signed)
 FYI Only or Action Required?: Action required by provider  Patient is followed in Pulmonology for COPD and recurrent pneumonia, last seen on 04/28/2024 by Marc Senior, MD. Called Nurse Triage reporting Shortness of Breath, Hypertension, Chest Pain, and Back Pain. Symptoms began several weeks ago. Interventions attempted: Prescription medications: antibx, Rescue inhaler, Maintenance inhaler, Nebulizer treatments, Home oxygen  use, and Increased fluids/rest. Symptoms are: "not getting a whole lot better".  Triage Disposition: Go to ED Now (Notify PCP)  Patient/caregiver understands and will follow disposition?: No, refuses disposition - Pt requesting asap script for higher dose of antibx and call back from pulm       Copied from CRM [id number]. Topic: Clinical - Red Word Triage >> May 11, 2024  2:58 PM Tyronne Galloway wrote: Red Word that prompted transfer to Nurse Triage: Pt is waiting on a medication to be filled for azithromycin  (ZITHROMAX ) 250 MG tablet. Pt stated she is having diff breathing and SOB. Pt still has yellow phlegm and congestion as well. Pt is requesting a new prescription for a stronger dose as her pharmacy won't let her get it until 6/13 for the 250 mg tablets. Reason for Disposition  Difficulty breathing  Answer Assessment - Initial Assessment Questions Pt is poor historian  E2C2 Pulmonary Triage - Initial Assessment Questions "Chief Complaint (e.g., cough, sob, wheezing, fever, chills, sweat or additional symptoms) *Go to specific symptom protocol after initial questions. Been on azithromycin , finished it last week Can't get chest congestion to come up but when it does it's yellow Alright when I'm on it then when I get off again the symptoms start back up again Not getting a whole lot better after antibx Short-winded more with activity with moving around Still weak from the pneumonia, don't know if still from that Somewhat weaker in last few days Hard to keep pushing when  don't feel like it Back pain between shoulder blades and down the center No chest pain currently or pain in jaw/neck/shoulder No chest pain right now but don't know just what it was, under my ribs under my breasts, just one side on the right Just don't have no wind to be moving around a lot Improvement to be able to get up, was in bed a lot with pneumonia last week On prilosec for indigestion Have had the chest pain No fever Wheezing just in early mornings  "Have you used your inhalers/maintenance medication?" Yes If yes, "What medications?" Using all my inhalers Nebulizer 2x/day Albuterol  sometimes more than 3-4x/day, some relief with albuterol  Breztri  helps me a whole lot  OXYGEN : "Do you wear supplemental oxygen ?" Yes If yes, "How many liters are you supposed to use?" At night and have had to use it during the day this week, get out of breath 4L  "Do you monitor your oxygen  levels?" Yes If yes, "What is your reading (oxygen  level) today?" 93-94%, was lower first thing this morning to 88-89% 92% 105 bpm  "What is your usual oxygen  saturation reading?"  (Note: Pulmonary O2 sats should be 90% or greater) 93-94%  6. CARDIAC HISTORY: "Do you have any history of heart disease?" (e.g., heart attack, angina, bypass surgery, angioplasty)      Significant  7. LUNG HISTORY: "Do you have any history of lung disease?"  (e.g., pulmonary embolus, asthma, emphysema)     Significant  Tell them to call that medicine, she won't see me tomorrow, have her raise the dose 250 mg is not enough, not getting well on that, usually after  a week feeling better BP 132/96 and that's high Took something for my back, back pain not as bad as it was after tylenol , don't know if it's how I slept or what Don't take BP meds No sweating or nausea Tell them to call me, they close at 5 pm Advised no guarantee of med or call back today Tell them to call me  Protocols used: Breathing Difficulty-A-AH, Chest  Pain-A-AH

## 2024-05-12 DIAGNOSIS — Z008 Encounter for other general examination: Secondary | ICD-10-CM | POA: Diagnosis not present

## 2024-05-12 DIAGNOSIS — J441 Chronic obstructive pulmonary disease with (acute) exacerbation: Secondary | ICD-10-CM | POA: Diagnosis not present

## 2024-05-15 ENCOUNTER — Other Ambulatory Visit: Payer: Self-pay | Admitting: Family Medicine

## 2024-05-15 DIAGNOSIS — F4323 Adjustment disorder with mixed anxiety and depressed mood: Secondary | ICD-10-CM

## 2024-05-16 ENCOUNTER — Encounter: Payer: Self-pay | Admitting: Family Medicine

## 2024-05-16 ENCOUNTER — Ambulatory Visit (INDEPENDENT_AMBULATORY_CARE_PROVIDER_SITE_OTHER): Admitting: Family Medicine

## 2024-05-16 VITALS — BP 130/80 | HR 98 | Ht 64.0 in | Wt 123.1 lb

## 2024-05-16 DIAGNOSIS — J449 Chronic obstructive pulmonary disease, unspecified: Secondary | ICD-10-CM | POA: Diagnosis not present

## 2024-05-16 DIAGNOSIS — J9611 Chronic respiratory failure with hypoxia: Secondary | ICD-10-CM | POA: Diagnosis not present

## 2024-05-16 DIAGNOSIS — B829 Intestinal parasitism, unspecified: Secondary | ICD-10-CM

## 2024-05-16 DIAGNOSIS — J432 Centrilobular emphysema: Secondary | ICD-10-CM

## 2024-05-16 NOTE — Progress Notes (Signed)
 Subjective:    Patient ID: Brandi [name], female    DOB: Apr 27, 1956, 68 y.o.   MRN: [id number]  Brandi [name] is a 68 y.o. female presenting on 05/16/2024 for COPD and Cough   HPI  Discussed the use of AI scribe software for clinical note transcription with the patient, who gave verbal consent to proceed.  History of Present Illness   Brandi [name] is a 68 year old female with COPD who presents with difficulty breathing and persistent symptoms following pneumonia.  She has ongoing difficulty breathing and persistent symptoms following a recent episode of pneumonia. Initially treated with an antibiotic regimen on a Monday, Wednesday, Friday schedule, she experienced significant congestion and was unable to expectorate sputum, leading to increased difficulty breathing and reliance on supplemental oxygen .  Subsequently, she was prescribed a different antibiotic and prednisone  starting May 12, 2024. She notes some improvement, as she is now able to expectorate a little sputum, but continues to experience significant difficulty breathing, especially in humid weather. Her oxygen  levels have been dropping into the 80s, necessitating the use of oxygen  and nebulizers.  She has a history of COPD and emphysema, complicating her respiratory symptoms. She has been on various antibiotics for an extended period and is concerned about the long-term use of these medications.  Additionally, she reports symptoms she associates with a possible parasitic infection, including brain fog, stomach problems, lack of energy, and digestive issues such as belching and burping. She is concerned about the possibility of a parasitic infection and has requested testing for parasites.  Her past medical history includes the absence of a gallbladder, which she mentions in relation to her digestive symptoms. She is currently taking Prilosec for these issues.  No consistent diarrhea but variable bowel habits.            05/16/2024    3:03 PM 05/09/2024    4:39 PM 04/24/2024    2:46 PM  Depression screen PHQ 2/9  Decreased Interest 0 0 0  Down, Depressed, Hopeless 0 0 0  PHQ - 2 Score 0 0 0  Altered sleeping 0  2  Tired, decreased energy 1  2  Change in appetite   0  Feeling bad or failure about yourself  0  0  Trouble concentrating 0  0  Moving slowly or fidgety/restless 0  0  Suicidal thoughts 0  0  PHQ-9 Score 1  4  Difficult doing work/chores Somewhat difficult  Somewhat difficult       05/16/2024    3:03 PM 04/24/2024    2:47 PM 03/26/2023   10:03 AM 02/12/2022   10:42 AM  GAD 7 : Generalized Anxiety Score  Nervous, Anxious, on Edge 0 1 0 0  Control/stop worrying 0 0 0 0  Worry too much - different things 0 0 0 0  Trouble relaxing 0 0 0 0  Restless 0 0 0 0  Easily annoyed or irritable 0 0 0 0  Afraid - awful might happen 0 0 0 0  Total GAD 7 Score 0 1 0 0  Anxiety Difficulty Not difficult at all Somewhat difficult Not difficult at all Not difficult at all    Social History   Tobacco Use   Smoking status: Former    Current packs/day: 0.00    Average packs/day: 0.8 packs/day for 41.0 years (30.8 ttl pk-yrs)    Types: Cigarettes    Start date: 12/07/1974    Quit date: 12/08/2015    Years since quitting: 8.4  Smokeless tobacco: Never  Vaping Use   Vaping status: Never Used  Substance Use Topics   Alcohol use: No   Drug use: No    Review of Systems Per HPI unless specifically indicated above     Objective:     BP 130/80 (BP Location: Left Arm, Patient Position: Sitting, Cuff Size: Normal)   Pulse 98   Ht 5\' 4"  (1.626 m)   Wt 123 lb 2 oz (55.8 kg)   SpO2 91%   BMI 21.13 kg/m   Wt Readings from Last 3 Encounters:  05/16/24 123 lb 2 oz (55.8 kg)  05/09/24 120 lb (54.4 kg)  04/28/24 124 lb (56.2 kg)    Physical Exam Vitals and nursing note reviewed.  Constitutional:      General: She is not in acute distress.    Appearance: She is well-developed. She is not diaphoretic.      Comments: Chronically ill appearing age 68 yr female comfortable, cooperative  HENT:     Head: Normocephalic and atraumatic.  Eyes:     General:        Right eye: No discharge.        Left eye: No discharge.     Conjunctiva/sclera: Conjunctivae normal.  Neck:     Thyroid : No thyromegaly.  Cardiovascular:     Rate and Rhythm: Normal rate and regular rhythm.     Heart sounds: Normal heart sounds. No murmur heard. Pulmonary:     Effort: Pulmonary effort is normal. No respiratory distress.     Breath sounds: No wheezing or rales.     Comments: Mild reduced air movement diffusely at baseline, no focal wheezing or crackles or abnormal sounds.   Portable oxygen  concentrator, not wearing during visit while seated. Musculoskeletal:        General: Normal range of motion.     Cervical back: Normal range of motion and neck supple.  Lymphadenopathy:     Cervical: No cervical adenopathy.  Skin:    General: Skin is warm and dry.     Findings: No erythema or rash.  Neurological:     Mental Status: She is alert and oriented to person, place, and time.  Psychiatric:        Behavior: Behavior normal.     Comments: Well groomed, good eye contact, normal speech and thoughts     I have personally reviewed the radiology report from 04/28/24 on Chest X-ray.  CLINICAL DATA:  Recent pneumonia   EXAM: CHEST - 2 VIEW   COMPARISON:  Chest x-ray performed January 31, 2024   FINDINGS: Patchy opacity projects over the right middle lobe. Lungs are otherwise hyperexpanded with peripheral lucency. No pleural effusion or pneumothorax.   IMPRESSION: 1. Patchy opacity in the right middle lobe. Differential considerations include residual infiltrate. Correlation with clinical history is recommended. Consider follow-up radiograph to demonstrate resolution versus further evaluation by CT chest.     Electronically Signed   By: Reagan Camera M.D.   On: 05/01/2024 09:55  Results for orders  placed or performed during the hospital encounter of 04/28/24  Immunoglobulins, QN, A/E/G/M   Collection Time: 04/28/24 12:22 PM  Result Value Ref Range   IgG (Immunoglobin G), Serum 826 586 - 1,602 mg/dL   IgA 86 (L) 87 - 161 mg/dL   IgM (Immunoglobulin M), Srm 34 26 - 217 mg/dL   IgE (Immunoglobulin E), Serum 356 6 - 495 IU/mL  CBC with Differential/Platelet   Collection Time: 04/28/24 12:22 PM  Result  Value Ref Range   WBC 6.3 4.0 - 10.5 K/uL   RBC 4.10 3.87 - 5.11 MIL/uL   Hemoglobin 13.2 12.0 - 15.0 g/dL   HCT 66.0 63.0 - 16.0 %   MCV 96.1 80.0 - 100.0 fL   MCH 32.2 26.0 - 34.0 pg   MCHC 33.5 30.0 - 36.0 g/dL   RDW 10.9 32.3 - 55.7 %   Platelets 280 150 - 400 K/uL   nRBC 0.0 0.0 - 0.2 %   Neutrophils Relative % 51 %   Neutro Abs 3.3 1.7 - 7.7 K/uL   Lymphocytes Relative 29 %   Lymphs Abs 1.8 0.7 - 4.0 K/uL   Monocytes Relative 9 %   Monocytes Absolute 0.6 0.1 - 1.0 K/uL   Eosinophils Relative 8 %   Eosinophils Absolute 0.5 0.0 - 0.5 K/uL   Basophils Relative 2 %   Basophils Absolute 0.1 0.0 - 0.1 K/uL   Immature Granulocytes 1 %   Abs Immature Granulocytes 0.04 0.00 - 0.07 K/uL      Assessment & Plan:   Problem List Items Addressed This Visit     Centrilobular emphysema (HCC)   Other Visit Diagnoses       Stage 3 severe COPD by GOLD classification (HCC)    -  Primary     Intestinal parasitism, unspecified       Relevant Orders   Ova and parasite examination     Chronic hypoxemic respiratory failure (HCC)           Centrilobular Emphysema, Severe / Chronic Obstructive Pulmonary Disease (COPD) Followed by Pulmonology [place] Chronic resp failure, on supplemental portable oxygen  Recent imaging reviewed with CXR 04/28/24 showed some patchy infiltrate with scarring R lung, present and stable since previous history of pneumonia. She has had frequent antibiotic courses / repeat over past >6 months, with concerns of recurrent pneumonia. Concern with advanced COPD  as documented with PFT imaging and office visits She is on maximum management for COPD and is on portable oxygen   Recently managed by Pulmonology already She has been on long term antibiotic prophylaxis Azithromycin  250mg  three times per week Mon-Weds-Fri, however she discontinued recently due to concern for persistent pneumonia. Treated by [facility] with Augmentin  and Prednisone  course.  Discussion about the goal with long term azithromycin  for prevention, explained it is not a primary treatment for pneumonia and that likely she is experiencing COPD inflammatory symptoms and changes that trigger her sputum and dyspnea, less likely bacterial infection.  Goal to avoid frequent high dose antibiotics in future.  - Complete current antibiotics (Augmentin ) and prednisone  taper. - Restart azithromycin  250 mg three times weekly post-antibiotics. - Consult pulmonology regarding sputum test in future. - She again asks for 2nd opinion, I advised her that I support Dr Sobia Karger Iba management and treatment plan. She has done extensive work up imaging diagnostics and counseling. We have limited on other pulmonology in town and I would recommend staying with her current provider. However I advised patient if she absolutely prefers a 2nd opinion it would likely need to be outside of [facility].  Patient Fear / Suspected Parasitic Infection Symptoms include brain fog, gastrointestinal disturbances, fatigue, and belching.   Stool sample test requested to rule out parasites. I advised her that I do not believe this test is required or necessary, however patient fixated on this as a barrier to her feeling as if we have addressed her concerns. - Order stool sample test for parasitic infection.  Orders Placed This Encounter  Procedures   Ova and parasite examination    No orders of the defined types were placed in this encounter.   Follow up plan: Return if symptoms worsen or fail to  improve.  Domingo Friend, DO [facility] 05/16/2024, 3:27 PM

## 2024-05-16 NOTE — Telephone Encounter (Signed)
 Requested Prescriptions  Pending Prescriptions Disp Refills   busPIRone  (BUSPAR ) 5 MG tablet [Pharmacy Med Name: BUSPIRONE  HCL 5 MG TAB] 180 tablet 0    Sig: TAKE 1 TABLET BY MOUTH TWICE DAILY AS NEEDED FOR ANXIETY     Psychiatry: Anxiolytics/Hypnotics - Non-controlled Passed - 05/16/2024 12:43 PM      Passed - Valid encounter within last 12 months    Recent Outpatient Visits           3 weeks ago Gastroesophageal reflux disease without esophagitis   [place] [facility] Raina Bunting, DO   3 months ago Centrilobular emphysema [facility])   [facility] Raina Bunting, DO   4 months ago Acute non-recurrent frontal sinusitis   [facility] [place], [place]leen [place], [place]

## 2024-05-16 NOTE — Patient Instructions (Addendum)
 Thank you for coming to the office today.  Finish current antibiotics, Augmentin , and the Prednisone  taper.  Once finished, start back on the Azithromycin  250mg  3 times a week - Mon - Weds - Fri  Try this next for longer period of time.  I will order the parasite test.  Can ask Dr Hajra [place]ba about Sputum sample.  Please schedule a Follow-up Appointment to: Return if symptoms worsen or fail to improve.  If you have any other questions or concerns, please feel free to call the office or send a message through MyChart. You may also schedule an earlier appointment if necessary.  Additionally, you may be receiving a survey about your experience at our office within a few days to 1 week by e-mail or mail. We value your feedback.  Domingo Friend, DO [facility], [place]

## 2024-05-17 DIAGNOSIS — B829 Intestinal parasitism, unspecified: Secondary | ICD-10-CM | POA: Diagnosis not present

## 2024-05-18 DIAGNOSIS — Z008 Encounter for other general examination: Secondary | ICD-10-CM | POA: Diagnosis not present

## 2024-05-18 DIAGNOSIS — B379 Candidiasis, unspecified: Secondary | ICD-10-CM | POA: Diagnosis not present

## 2024-05-19 ENCOUNTER — Ambulatory Visit: Payer: Self-pay | Admitting: Internal Medicine

## 2024-05-22 DIAGNOSIS — J449 Chronic obstructive pulmonary disease, unspecified: Secondary | ICD-10-CM | POA: Diagnosis not present

## 2024-05-22 DIAGNOSIS — M810 Age-related osteoporosis without current pathological fracture: Secondary | ICD-10-CM | POA: Diagnosis not present

## 2024-05-22 DIAGNOSIS — K219 Gastro-esophageal reflux disease without esophagitis: Secondary | ICD-10-CM | POA: Diagnosis not present

## 2024-05-22 DIAGNOSIS — E785 Hyperlipidemia, unspecified: Secondary | ICD-10-CM | POA: Diagnosis not present

## 2024-05-22 DIAGNOSIS — J9610 Chronic respiratory failure, unspecified whether with hypoxia or hypercapnia: Secondary | ICD-10-CM | POA: Diagnosis not present

## 2024-05-22 DIAGNOSIS — F411 Generalized anxiety disorder: Secondary | ICD-10-CM | POA: Diagnosis not present

## 2024-05-22 DIAGNOSIS — Z008 Encounter for other general examination: Secondary | ICD-10-CM | POA: Diagnosis not present

## 2024-05-22 DIAGNOSIS — Z9981 Dependence on supplemental oxygen: Secondary | ICD-10-CM | POA: Diagnosis not present

## 2024-05-24 DIAGNOSIS — J449 Chronic obstructive pulmonary disease, unspecified: Secondary | ICD-10-CM | POA: Diagnosis not present

## 2024-05-31 ENCOUNTER — Telehealth: Payer: Self-pay

## 2024-05-31 NOTE — Telephone Encounter (Signed)
 Copied from CRM [id number]. Topic: Referral - Prior Authorization Question >> May 30, 2024  4:52 PM Tobias CROME wrote: Reason for CRM: Brandi [name] w/ [facility] requesting prior authorization for pulmonology referral. Pt requesting referral to be sent to [name] at [facility]. Per [facility] [employer] patient has an HMO plan which requires her to see in network providers. Dr. Pane would be OON and therefore prior authorization is needed.   Requesting prior authorization to be completed so that patient can see [name]  Ph: [phone] [address] [name] [address]  Provider line for [facility] [employer]: [phone]

## 2024-06-07 ENCOUNTER — Telehealth: Payer: Self-pay

## 2024-06-08 NOTE — Telephone Encounter (Signed)
 Brandi [name] (financial navigator) with [facility] called to advise Dr. Larose that they are not able to schedule patient as her insurance is showing she would be out of network. Patient is highly upset with them as they are unable to schedule her. Brandi states patient is very upset and stating she will have a lawyer involved. Brandi states she attempted to explain to patient she would have to pay out of pocket and it would be very costly for patient. Requesting patient be referred to an In [employer].

## 2024-06-08 NOTE — Telephone Encounter (Signed)
 Not at this time, I don't have an answer as to where else she can go. That is up to her insurance. They need to give her an in network list of providers perhaps and then we can help choose one if she needs our assistance.  She is well established with our in [employer]. We were attempting to get her another opinion at other location and chose [facility].  I am not aware of other in network locations for her that she would prefer to go to. Possibly [facility], [facility] or other locations. But I am concerned these may also be out of network as well.  [name], DO [facility] 06/08/2024, 1:52 PM

## 2024-06-15 ENCOUNTER — Encounter: Payer: Self-pay | Admitting: Pulmonary Disease

## 2024-06-15 ENCOUNTER — Ambulatory Visit (INDEPENDENT_AMBULATORY_CARE_PROVIDER_SITE_OTHER): Admitting: Pulmonary Disease

## 2024-06-15 VITALS — BP 120/60 | HR 102 | Temp 98.6°F | Ht 64.0 in | Wt 122.8 lb

## 2024-06-15 DIAGNOSIS — Z87891 Personal history of nicotine dependence: Secondary | ICD-10-CM | POA: Diagnosis not present

## 2024-06-15 DIAGNOSIS — R0602 Shortness of breath: Secondary | ICD-10-CM

## 2024-06-15 DIAGNOSIS — J9611 Chronic respiratory failure with hypoxia: Secondary | ICD-10-CM

## 2024-06-15 DIAGNOSIS — J449 Chronic obstructive pulmonary disease, unspecified: Secondary | ICD-10-CM

## 2024-06-15 MED ORDER — PREDNISONE 20 MG PO TABS: 20.0000 mg | ORAL_TABLET | Freq: Every day | ORAL | 0 refills | Status: AC

## 2024-06-15 NOTE — Patient Instructions (Addendum)
 VISIT SUMMARY:  Today, we discussed your severe COPD and the recent increase in difficulty breathing, especially due to the cool and humid weather. We reviewed your current medications and the challenges you face with the cost of Ohtuvayre . We also talked about your previous attempts at pulmonary rehabilitation and your questions about eosinophils.  YOUR PLAN:  -CHRONIC OBSTRUCTIVE PULMONARY DISEASE (COPD): COPD is a chronic lung disease that makes it hard to breathe. Your symptoms have worsened due to recent weather changes. We will consult the pharmacy team to help with the cost of Ohtuvayre  and prescribe a 5-day course of prednisone  to manage your symptoms. Please check with your insurance about coverage for pulmonary rehabilitation.  INSTRUCTIONS:  Please schedule a follow-up appointment in three months.

## 2024-06-15 NOTE — Progress Notes (Signed)
 Subjective:    Patient ID: Brandi [name], female    DOB: September 30, 1956, 68 y.o.   MRN: [id number]  Patient Care Team: Edman [name], DO as PCP - General (Family Medicine) Tamea Dedra CROME, MD as Consulting Physician (Pulmonary Disease) Karoline Lima, RN as [facility] Care Management  Chief Complaint  Patient presents with   Follow-up    Shortness of breath on exertion and wheezing.     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  Last evaluated here on 28 Apr 2024.   HPI Discussed the use of AI scribe software for clinical note transcription with the patient, who gave verbal consent to proceed.  History of Present Illness   Brandi [name] is a 68 year old female with severe COPD who presents with difficulty breathing.  She experiences significant difficulty breathing, particularly exacerbated by recent hot and humid weather conditions. She describes a lack of energy and breath, stating 'I just couldn't have the energy or the breath to breathe.'  She has been using her prescribed medications, including albuterol , which provides more relief than her current medication, Ohtuvayre . The cost of Ohtuvayre  is prohibitive, with a quoted price of $650, which she cannot afford. Without insurance, the medication costs $3,000.  She has previously attempted to enroll in pulmonary rehabilitation but was unable to qualify for home-based services and found it challenging to attend sessions at the hospital due to the cost and logistics. She recalls being informed of a $65 charge per session, which she cannot afford.  She has been on a regimen of Azithromycin  taken three times a week (Monday, Wednesday, Friday) to prevent repeated infections, but she is unsure about the duration of this treatment. She has not noticed a significant difference with prednisone  use, although it provides  some help.  She inquires about eosinophils, asking what they are and what they do.  We have discussed these issues with her particularly in view of her insistence that she had allergies however her testing has been negative previously.  She has never exhibited eosinophilia.  She is on supplemental oxygen  with ambulation and during sleep.  She is compliant with this.  Has POC available with her today.  Does not require oxygen  at rest.   DATA 04/09/2021 LDCT: Moderate to severe paraseptal and centrilobular emphysema.  Left upper lobe nodule measuring 2 mm (decreased from prior), compression fractures T7 and T9 noted previously and unchanged.  Coronary artery calcifications. 07/04/2021 alpha-1 antitrypsin: Phenotype MM, level 140 mg/dL (normal) 91/96/7977 overnight oximetry: Baseline O2 sat 90% during sleep with desaturations as low as 83% during the night desaturation events lasted for over an hour. 08/14/2021 PFTs: FEV1 1.13 L or 46% predicted, FVC 2.42 L or 76% predicted, FEV1/FVC 47%, there is a mild response to bronchodilator with a 10% net change.  There is hyperinflation with TLC at 128% and air trapping with RV at 188%.  Diffusion capacity is moderately to severely reduced by Kco.  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 08/14/2021 echocardiogram: LVEF 60 to 65%, grade 1 DD 12/04/2021 chest x-ray: Emphysema with no evidence of acute cardiopulmonary disease 12/24/2021 overnight oximetry: Baseline O2 sat 90% desaturations as low as 83%.   12/26/2021 nuclear medicine stress test: No significant ischemia, normal wall motion, EF estimated at 77%, coronary calcification in the LAD, mild aortic atherosclerosis 01/05/2023 chest x-ray PA and lateral: Infiltrates in the right middle  lobe favor to be infectious/inflammatory 02/23/2023 chest x-ray PA and lateral: Improved though incompletely resolved middle lobe infection, minimal residual, emphysema 05/25/2023 CT chest without contrast: No  acute cardiopulmonary disease, moderate to severe centrilobular emphysematous disease without significant change.  Borderline cardiomegaly unchanged.  Atherosclerotic coronary artery disease.  Stable mild superior endplate compression deformity mid thoracic spine. 05/25/2023 PFTs: FEV1 1.0 L or 42% predicted, FVC 2.44 L or 78% predicted, FEV1/FVC 41%, there is a reversible component with FEV1 improvement by 17% after bronchodilator.  There is hyperinflation and air trapping.  Diffusion capacity severely impaired, this is consistent with emphysema and reversible airways component.  Lung function has shown modest decrease from prior. 01/31/2024 CXR PA and lateral: Significant COPD/emphysema changes, no active cardiopulmonary disease.  Flattening of the diaphragms noted in keeping with severe COPD. 02/04/2024 alpha-1 antitrypsin: Verified MM, level 167 mg/dL (normal) 95/69/7974 PFTs: FEV1 0.75 L or 32% predicted, FVC 1.91 L or 62% predicted, FEV1/FVC 39%, post bronchodilator FEV1 1.04 L or 44% predicted for a net change of 38%.  Indicating airway reversibility.  Lung volumes show hyperinflation and air trapping.  Diffusion capacity is severely impaired, compared to study performed 25 May 2023 there has been significant decline in FEV1.  This is consistent with severe obstructive airways disease emphysematous type with reversible airway obstruction component.     Review of Systems A 10 point review of systems was performed and it is as noted above otherwise negative.   Patient Active Problem List   Diagnosis Date Noted   Osteoporosis 11/29/2023   Rash and nonspecific skin eruption 05/22/2023   Facial tingling sensation 05/22/2023   Pre-diabetes 04/08/2021   Vitamin D  deficiency 04/08/2021   COVID-19 vaccination declined 12/04/2020   Centrilobular emphysema (HCC) 04/08/2020   Nodule of upper lobe of left lung 04/08/2020   Aortic atherosclerosis (HCC) 05/21/2019   Coronary artery calcification seen on  CT scan 05/21/2019   Former smoker 05/21/2019   Hyperlipidemia 05/21/2019    Social History   Tobacco Use   Smoking status: Former    Current packs/day: 0.00    Average packs/day: 0.8 packs/day for 41.0 years (30.8 ttl pk-yrs)    Types: Cigarettes    Start date: 12/07/1974    Quit date: 12/08/2015    Years since quitting: 8.5   Smokeless tobacco: Never  Substance Use Topics   Alcohol use: No    No Known Allergies  Current Meds  Medication Sig   albuterol  (PROVENTIL ) (2.5 MG/3ML) 0.083% nebulizer solution Take 3 mLs (2.5 mg total) by nebulization every 6 (six) hours as needed for wheezing or shortness of breath.   albuterol  (VENTOLIN  HFA) 108 (90 Base) MCG/ACT inhaler Inhale 2 puffs into the lungs every 4 (four) hours as needed for wheezing or shortness of breath.   alendronate  (FOSAMAX ) 70 MG tablet Take 1 tablet (70 mg total) by mouth once a week.   aspirin  EC 81 MG tablet Take 1 tablet (81 mg total) by mouth daily.   azithromycin  (ZITHROMAX ) 250 MG tablet Take 1 tablet (250 mg total) by mouth 3 (three) times a week. M-W-F   Budeson-Glycopyrrol-Formoterol (BREZTRI  AEROSPHERE) 160-9-4.8 MCG/ACT AERO Inhale 2 puffs into the lungs in the morning and at bedtime.   busPIRone  (BUSPAR ) 5 MG tablet TAKE 1 TABLET BY MOUTH TWICE DAILY AS NEEDED FOR ANXIETY   Calcium  Carbonate-Vit D-Min (CALCIUM  1200) 1200-1000 MG-UNIT CHEW Chew 1,200 mg by mouth daily.   Cholecalciferol (VITAMIN D3) 125 MCG (5000 UT) TABS Take by mouth. Vitamin  D3 5,000 IU daily for 12 weeks then reduce to OTC Vitamin D3 2,000 IU daily for maintenance   ezetimibe  (ZETIA ) 10 MG tablet Take 1 tablet (10 mg total) by mouth daily.   famotidine  (PEPCID ) 20 MG tablet Take 1 tablet (20 mg total) by mouth 2 (two) times daily as needed.   feeding supplement (ENSURE ENLIVE / ENSURE PLUS) LIQD Take 237 mLs by mouth 2 (two) times daily between meals.   montelukast  (SINGULAIR ) 10 MG tablet TAKE 1 TABLET BY MOUTH AT BEDTIME   Multiple  Vitamin (MULTIVITAMIN WITH MINERALS) TABS tablet Take 1 tablet by mouth daily.   naproxen  (NAPROSYN ) 500 MG tablet Take 1 tablet (500 mg total) by mouth 2 (two) times daily with a meal. For 1-2 weeks then as needed   OHTUVAYRE  3 MG/2.5ML SUSP Inhale 3 mg into the lungs 2 (two) times daily.   omeprazole  (PRILOSEC) 20 MG capsule Take 1 capsule (20 mg total) by mouth 2 (two) times daily before a meal.   OXYGEN  Inhale 3 L into the lungs at bedtime as needed.   predniSONE  (DELTASONE ) 20 MG tablet Take 1 tablet (20 mg total) by mouth daily with breakfast for 5 days.   rosuvastatin  (CRESTOR ) 10 MG tablet Take 1 tablet (10 mg total) by mouth daily.   sodium chloride  (OCEAN) 0.65 % SOLN nasal spray Place 1 spray into both nostrils as needed for congestion.   Specialty Vitamins Products (HAIR NOURISHING SUPPLEMENT PO) Take 1 tablet by mouth daily.   tiZANidine  (ZANAFLEX ) 2 MG tablet Take 1 tablet (2 mg total) by mouth every 8 (eight) hours as needed for muscle spasms.   triamcinolone  cream (KENALOG ) 0.5 % Apply 1 Application topically 2 (two) times daily as needed (allergic reaction on skin swelling). To affected areas, for up to 2 weeks.    Immunization History  Administered Date(s) Administered   Moderna Sars-Covid-2 Vaccination 05/26/2021, 06/24/2021        Objective:     BP 120/60 (BP Location: Left Arm, Patient Position: Sitting, Cuff Size: Normal)   Pulse (!) 102   Temp 98.6 F (37 C) (Oral)   Ht 5' 4 (1.626 m)   Wt 122 lb 12.8 oz (55.7 kg)   SpO2 95%   BMI 21.08 kg/m   SpO2: 95 % O2 Device: None (Room air)  GENERAL: Well-developed, well-nourished woman, no acute distress.  Presents in transport chair due to dyspnea.  Uncomfortable but no tachypnea, no conversational dyspnea.  Not using POC. HEAD: Normocephalic, atraumatic.  EYES: Pupils equal, round, reactive to light.  No scleral icterus.  MOUTH: Poor dentition. She does have visible nasal septal deviation.  Oral mucosa moist,  no thrush. NECK: Supple. No thyromegaly. Trachea midline. No JVD.  No adenopathy. PULMONARY: Distant breath sounds, fair air movement. Coarse, otherwise no adventitious sounds. CARDIOVASCULAR: S1 and S2. Regular rate and rhythm.  No rubs, murmurs or gallops heard. ABDOMEN: Benign. MUSCULOSKELETAL: No joint deformity, no clubbing, no edema.  NEUROLOGIC: No focal deficit, speech is fluent.  Unsteady gait due to generalized weakness. SKIN: Intact,warm,dry. PSYCH: Mood and behavior normal.      Assessment & Plan:     ICD-10-CM   1. Stage 3 severe COPD by GOLD classification (HCC)  J44.9     2. Chronic respiratory failure with hypoxia (HCC)  J96.11     3. Shortness of breath  R06.02      Meds ordered this encounter  Medications   predniSONE  (DELTASONE ) 20 MG tablet  Sig: Take 1 tablet (20 mg total) by mouth daily with breakfast for 5 days.    Dispense:  5 tablet    Refill:  0    Discussion:    Chronic Obstructive Pulmonary Disease (COPD) Severe COPD exacerbation due to recent weather changes, resulting in increased dyspnea. Albuterol  provides more relief than Ohtuvayre , which is unaffordable without insurance assistance. Eosinophil levels are not elevated, precluding eligibility for certain biologic treatments (i.e. Dupixent). Pulmonary rehabilitation is recommended but cost and logistics are barriers. - Consult pharmacy team for assistance with Ohtuvayre  costs. - Explained to patient that Ohtuvayre  is to address neutrophilic inflammation rather than eosinophilic inflammation - Prescribe prednisone  5-day course to assess response. - Continue Breztri  2 puffs twice a day, continue as needed albuterol . - Continue supplemental oxygen  with activity and during sleep [employer] inquiry for pulmonary rehabilitation coverage. - Schedule follow-up in three months.     Advised if symptoms do not improve or worsen, to please contact office for sooner follow up or seek emergency  care.    I spent 40 minutes of dedicated to the care of this patient on the date of this encounter to include pre-visit review of records, face-to-face time with the patient discussing conditions above, post visit ordering of testing, clinical documentation with the electronic health record, making appropriate referrals as documented, and communicating necessary findings to members of the patients care team.     C. Leita Sanders, MD Advanced Bronchoscopy PCCM [facility]    *This note was generated using voice recognition software/Dragon and/or AI transcription program.  Despite best efforts to proofread, errors can occur which can change the meaning. Any transcriptional errors that result from this process are unintentional and may not be fully corrected at the time of dictation.

## 2024-06-16 ENCOUNTER — Telehealth: Payer: Self-pay | Admitting: Pharmacist

## 2024-06-16 NOTE — Telephone Encounter (Signed)
 Received Epic secure chat message from [place], CMA:  Per Dr. Tamea. please check on financial help with Ohtuvayre . patient reports she is being charged $600, which she can not afford.

## 2024-06-20 NOTE — Telephone Encounter (Signed)
 Called patient regarding Ohtuvayre  cost assistance.  Unable to reach. Left a voicemail. Sent a [employer] geologist. Advised patient to call Patient Assistance Program and express financial hardship. Provided patient with phone number to the program.   Deleta Colt PharmD Candidate [id number]  [facility]

## 2024-06-23 DIAGNOSIS — J449 Chronic obstructive pulmonary disease, unspecified: Secondary | ICD-10-CM | POA: Diagnosis not present

## 2024-07-03 ENCOUNTER — Telehealth: Payer: Self-pay

## 2024-07-08 ENCOUNTER — Other Ambulatory Visit: Payer: Self-pay | Admitting: Pulmonary Disease

## 2024-07-10 ENCOUNTER — Telehealth: Payer: Self-pay

## 2024-07-10 DIAGNOSIS — J441 Chronic obstructive pulmonary disease with (acute) exacerbation: Secondary | ICD-10-CM | POA: Diagnosis not present

## 2024-07-10 DIAGNOSIS — Z008 Encounter for other general examination: Secondary | ICD-10-CM | POA: Diagnosis not present

## 2024-07-10 NOTE — Telephone Encounter (Signed)
 Copied from CRM [id number]. Topic: Referral - Question >> Jul 10, 2024 11:37 AM Deleta RAMAN wrote: Reason for CRM: Rosaline from [facility] is calling regarding referral for pulmonology please follow up with michelle at [phone]

## 2024-07-11 ENCOUNTER — Telehealth: Payer: Self-pay

## 2024-07-11 DIAGNOSIS — J432 Centrilobular emphysema: Secondary | ICD-10-CM

## 2024-07-11 DIAGNOSIS — J449 Chronic obstructive pulmonary disease, unspecified: Secondary | ICD-10-CM

## 2024-07-11 DIAGNOSIS — R0609 Other forms of dyspnea: Secondary | ICD-10-CM

## 2024-07-11 NOTE — Addendum Note (Signed)
 Addended by: EDMAN [name] on: 07/11/2024 07:34 PM   Modules accepted: Orders

## 2024-07-11 NOTE — Telephone Encounter (Signed)
 Patient already has a Diplomatic Services operational officer through [facility]).  We attempted to refer to [facility] for 2nd opinion and [facility] declined the referral.  [name], DO [facility] 07/11/2024, 7:35 PM

## 2024-07-11 NOTE — Telephone Encounter (Signed)
 Copied from CRM #[id number]. Topic: Referral - Request for Referral >> Jul 11, 2024  3:37 PM Wess RAMAN wrote: Did the patient discuss referral with their provider in the last year? Yes (If No - schedule appointment) (If Yes - send message)  Appointment offered? No  Type of order/referral and detailed reason for visit: Home Health  Preference of office, provider, location:  [facility] Phone: [phone] Fax: [phone]  If referral order, have you been seen by this specialty before? No (If Yes, this issue or another issue? When? Where?  Can we respond through MyChart? No Call [phone]

## 2024-07-11 NOTE — Telephone Encounter (Signed)
 Referral submitted to Home Health

## 2024-07-12 NOTE — Telephone Encounter (Signed)
 Spoke to patient, notified someone should be contacting her to set up home health

## 2024-07-13 ENCOUNTER — Telehealth: Payer: Self-pay

## 2024-07-13 NOTE — Telephone Encounter (Signed)
 Copied from CRM [id number]. Topic: Referral - Question >> Jul 13, 2024  1:20 PM Montie POUR wrote: Reason for CRM:  [facility] does not have a nurse at this time so they are not able to accept this referral for Home Health Skilled Nursing. Please referral Brandi [name] to a different Home Health. Thanks

## 2024-07-13 NOTE — Telephone Encounter (Signed)
 Copied from CRM [id number]. Topic: General - Other >> Jul 13, 2024  1:38 PM Geneva B wrote: Reason for CRM: [facility] calling saying physical signed home health order [id number] fax number [phone]

## 2024-07-14 ENCOUNTER — Telehealth: Payer: Self-pay

## 2024-07-14 NOTE — Telephone Encounter (Signed)
 Copied from CRM [id number]. Topic: General - Other >> Jul 13, 2024  4:45 PM Zebedee SAUNDERS wrote: Reason for CRM: Received call from [facility] per Bari [phone] pt has [facility] which required Prior authorization which can take up to 5 business days. Please let Dr. Edman know care might be delayed. Please call Bari to confirm.

## 2024-07-17 ENCOUNTER — Telehealth: Payer: Self-pay

## 2024-07-17 DIAGNOSIS — R7303 Prediabetes: Secondary | ICD-10-CM | POA: Diagnosis not present

## 2024-07-17 DIAGNOSIS — J441 Chronic obstructive pulmonary disease with (acute) exacerbation: Secondary | ICD-10-CM | POA: Diagnosis not present

## 2024-07-17 DIAGNOSIS — J432 Centrilobular emphysema: Secondary | ICD-10-CM | POA: Diagnosis not present

## 2024-07-17 DIAGNOSIS — I700 Atherosclerosis of aorta: Secondary | ICD-10-CM | POA: Diagnosis not present

## 2024-07-17 DIAGNOSIS — Z7983 Long term (current) use of bisphosphonates: Secondary | ICD-10-CM | POA: Diagnosis not present

## 2024-07-17 DIAGNOSIS — Z7982 Long term (current) use of aspirin: Secondary | ICD-10-CM | POA: Diagnosis not present

## 2024-07-17 DIAGNOSIS — J189 Pneumonia, unspecified organism: Secondary | ICD-10-CM | POA: Diagnosis not present

## 2024-07-17 DIAGNOSIS — E785 Hyperlipidemia, unspecified: Secondary | ICD-10-CM | POA: Diagnosis not present

## 2024-07-17 DIAGNOSIS — I2510 Atherosclerotic heart disease of native coronary artery without angina pectoris: Secondary | ICD-10-CM | POA: Diagnosis not present

## 2024-07-17 DIAGNOSIS — J440 Chronic obstructive pulmonary disease with acute lower respiratory infection: Secondary | ICD-10-CM | POA: Diagnosis not present

## 2024-07-17 DIAGNOSIS — M810 Age-related osteoporosis without current pathological fracture: Secondary | ICD-10-CM | POA: Diagnosis not present

## 2024-07-17 DIAGNOSIS — E559 Vitamin D deficiency, unspecified: Secondary | ICD-10-CM | POA: Diagnosis not present

## 2024-07-17 DIAGNOSIS — J9611 Chronic respiratory failure with hypoxia: Secondary | ICD-10-CM | POA: Diagnosis not present

## 2024-07-17 NOTE — Telephone Encounter (Signed)
 Copied from CRM [id number]. Topic: General - Phone/Fax/Address >> Jul 17, 2024  3:49 PM Antwanette L wrote: Jewel from [facility] is calling to see if we received a fax from 8/8. The fax was received. Jewel just wanted to let Dr. Edman know that a pharmacist spoke w/ the patient and just wanted send over the information that was discussed and maybe schedule a possible f/u.

## 2024-07-18 ENCOUNTER — Ambulatory Visit: Payer: Self-pay | Admitting: Internal Medicine

## 2024-07-18 ENCOUNTER — Encounter: Payer: Self-pay | Admitting: Internal Medicine

## 2024-07-18 ENCOUNTER — Ambulatory Visit
Admission: RE | Admit: 2024-07-18 | Discharge: 2024-07-18 | Disposition: A | Discharge status: Home / Self Care | Source: Ambulatory Visit | Attending: Internal Medicine | Admitting: Internal Medicine

## 2024-07-18 ENCOUNTER — Ambulatory Visit (INDEPENDENT_AMBULATORY_CARE_PROVIDER_SITE_OTHER): Admitting: Internal Medicine

## 2024-07-18 VITALS — BP 112/70 | HR 104 | Ht 64.0 in | Wt 121.0 lb

## 2024-07-18 DIAGNOSIS — R059 Cough, unspecified: Secondary | ICD-10-CM | POA: Diagnosis not present

## 2024-07-18 DIAGNOSIS — J9611 Chronic respiratory failure with hypoxia: Secondary | ICD-10-CM | POA: Diagnosis not present

## 2024-07-18 DIAGNOSIS — R0602 Shortness of breath: Secondary | ICD-10-CM | POA: Insufficient documentation

## 2024-07-18 DIAGNOSIS — J432 Centrilobular emphysema: Secondary | ICD-10-CM | POA: Diagnosis not present

## 2024-07-18 NOTE — Patient Instructions (Signed)
 Chronic Obstructive Pulmonary Disease Exacerbation  Chronic obstructive pulmonary disease (COPD) is a long-term (chronic) lung problem. When you have COPD, it can feel harder to breathe in or out. COPD exacerbation is a flare-up of symptoms when breathing gets worse and more treatment may be needed. Without treatment, flare-ups can be life-threatening. If they happen often, your lungs can become more damaged. What are the causes? Not taking your usual COPD medicines as told by your health care provider. A cold or the flu, which can cause infection in your lungs. Being exposed to things that make your breathing worse, such as: Smoke. Air pollution. Fumes. Dust. Allergies. Weather changes. What are the signs or symptoms? Symptoms do not get better or get worse even if you take your medicines as told by your provider. Symptoms may include: More shortness of breath. You may only be able to speak one or two words at a time. More coughing or mucus from your lungs. More wheezing or chest tightness. Being more tired and having less energy. Confusion. How is this diagnosed? This condition is diagnosed based on: Symptoms that get worse. Your medical history. A physical exam. You may also have tests, including: A chest X-ray. Blood or mucus tests. How is this treated? You may be able to stay home or you may need to go to the hospital. Treatment may include: Taking medicines. These may include: Inhalers. These have medicines in them that you breathe in. These may be more of what you already take or they may be new. Steroids. These reduce inflammation in the airways. These may be inhaled, taken by mouth, or given in an IV. Antibiotics. These treat infection. Using oxygen. Using a device to help you clear mucus. Follow these instructions at home: Medicines Take your medicines only as told by your provider. If you were given antibiotics or steroids, take them as told by your provider. Do  not stop taking them even if you start to feel better. Lifestyle Several times a day, wash your hands with soap and water for at least 20 seconds. If you cannot use soap and water, use hand sanitizer. This may help keep you from getting an infection. Avoid being around crowds or people who are sick. Do not smoke or use any products that contain nicotine or tobacco. If you need help quitting, ask your provider. Return to your normal activities when your provider says that it's safe. Use breathing methods to control your stress and catch your breath. How is this prevented? Follow your COPD action plan. The action plan tells you what to do if you're feeling good and what to do when you start feeling worse. Discuss the plan often with your provider. Make sure you get all the shots, also called vaccines, that your provider recommends. Ask your provider about a flu shot and a pneumonia shot. Use oxygen therapy if told by your provider. If you need home oxygen therapy, ask your provider how often to check your oxygen level with a device called an oximeter. Keep all follow-up visits to review your COPD action plan. Your provider will want to check on your condition often to keep you healthy and out of the hospital. Contact a health care provider if: Your COPD symptoms get worse. You have a fever or chills. You have trouble doing daily activities. You have trouble breathing even when you are resting. Get help right away if: You are short of breath and cannot: Talk in full sentences. Do normal activities. You have chest  pain. You feel confused. These symptoms may be an emergency. Call 911 right away. Do not wait to see if the symptoms will go away. Do not drive yourself to the hospital. This information is not intended to replace advice given to you by your health care provider. Make sure you discuss any questions you have with your health care provider. Document Revised: 08/26/2023 Document  Reviewed: 02/08/2023 Elsevier Patient Education  2024 [employer].

## 2024-07-18 NOTE — Progress Notes (Signed)
 Subjective:    Patient ID: Brandi [name], female    DOB: [birth year], 68 y.o.   MRN: [id number]  HPI  Discussed the use of AI scribe software for clinical note transcription with the patient, who gave verbal consent to proceed.  Brandi [name] is a 68 year old female with severe COPD who presents with worsening breathing and leg symptoms.  She has been experiencing worsening breathing difficulties and increased congestion over the last month, with a significant amount of yellow sputum production. She is currently using breztri  and albuterol  inhalers, as well as a nebulizer, but feels that her congestion is more than usual. No drainage down the back of her throat, but she has difficulty clearing her throat.  She was recently prescribed levaquin  750 mg, which she took for two days before stopping due to tingling and numbness in her legs.  She was also prescribed prednisone  and she reports she has a few days left of this.  She is on prophylactic azithromycin  to be taken three times a week prescribed by her pulmonologist. She   She has a history of COPD and is on continuous oxygen  therapy. Her breathing is affected by weather changes, particularly hot and humid conditions, which have kept her indoors for most of the summer. She does not smoke.  She is currently taking over-the-counter mucinex  to help with mucus production. She has also used a nebulizer medication called Ulperi in the past but finds it too expensive to continue regularly.       Review of Systems   Past Medical History:  Diagnosis Date   COPD (chronic obstructive pulmonary disease) (HCC)    Hyperlipidemia     Current Outpatient Medications  Medication Sig Dispense Refill   albuterol  (PROVENTIL ) (2.5 MG/3ML) 0.083% nebulizer solution Take 3 mLs (2.5 mg total) by nebulization every 6 (six) hours as needed for wheezing or shortness of breath. 75 mL 6   albuterol  (VENTOLIN  HFA) 108 (90 Base) MCG/ACT inhaler Inhale 2 puffs into  the lungs every 4 (four) hours as needed for wheezing or shortness of breath. 8.5 g 2   alendronate  (FOSAMAX ) 70 MG tablet Take 1 tablet (70 mg total) by mouth once a week. 12 tablet 3   aspirin  EC 81 MG tablet Take 1 tablet (81 mg total) by mouth daily. 90 tablet 3   azithromycin  (ZITHROMAX ) 250 MG tablet TAKE 1 TABLET BY MOUTH 3 TIMES A WEEK ASDIRECTED ON MONDAY  WEDNESDAY  AND FRIDAYS 12 each 2   Budeson-Glycopyrrol-Formoterol (BREZTRI  AEROSPHERE) 160-9-4.8 MCG/ACT AERO Inhale 2 puffs into the lungs in the morning and at bedtime. 3 each 3   busPIRone  (BUSPAR ) 5 MG tablet TAKE 1 TABLET BY MOUTH TWICE DAILY AS NEEDED FOR ANXIETY 180 tablet 0   Calcium  Carbonate-Vit D-Min (CALCIUM  1200) 1200-1000 MG-UNIT CHEW Chew 1,200 mg by mouth daily. 30 each 11   Cholecalciferol (VITAMIN D3) 125 MCG (5000 UT) TABS Take by mouth. Vitamin D3 5,000 IU daily for 12 weeks then reduce to OTC Vitamin D3 2,000 IU daily for maintenance     ezetimibe  (ZETIA ) 10 MG tablet Take 1 tablet (10 mg total) by mouth daily. 90 tablet 3   famotidine  (PEPCID ) 20 MG tablet Take 1 tablet (20 mg total) by mouth 2 (two) times daily as needed. 180 tablet 1   feeding supplement (ENSURE ENLIVE / ENSURE PLUS) LIQD Take 237 mLs by mouth 2 (two) times daily between meals. 237 mL 12   montelukast  (SINGULAIR ) 10 MG tablet TAKE 1  TABLET BY MOUTH AT BEDTIME 90 tablet 0   Multiple Vitamin (MULTIVITAMIN WITH MINERALS) TABS tablet Take 1 tablet by mouth daily.     naproxen  (NAPROSYN ) 500 MG tablet Take 1 tablet (500 mg total) by mouth 2 (two) times daily with a meal. For 1-2 weeks then as needed 60 tablet 2   OHTUVAYRE  3 MG/2.5ML SUSP Inhale 3 mg into the lungs 2 (two) times daily. 150 mL 11   omeprazole  (PRILOSEC) 20 MG capsule Take 1 capsule (20 mg total) by mouth 2 (two) times daily before a meal. 180 capsule 3   OXYGEN  Inhale 3 L into the lungs at bedtime as needed.     rosuvastatin  (CRESTOR ) 10 MG tablet Take 1 tablet (10 mg total) by mouth  daily. 90 tablet 3   sodium chloride  (OCEAN) 0.65 % SOLN nasal spray Place 1 spray into both nostrils as needed for congestion.     Specialty Vitamins Products (HAIR NOURISHING SUPPLEMENT PO) Take 1 tablet by mouth daily.     tiZANidine  (ZANAFLEX ) 2 MG tablet Take 1 tablet (2 mg total) by mouth every 8 (eight) hours as needed for muscle spasms. 60 tablet 2   triamcinolone  cream (KENALOG ) 0.5 % Apply 1 Application topically 2 (two) times daily as needed (allergic reaction on skin swelling). To affected areas, for up to 2 weeks. 30 g 2   No current facility-administered medications for this visit.    No Known Allergies  Family History  Problem Relation Age of Onset   Breast cancer Mother 18   Alzheimer's disease Mother     Social History   Socioeconomic History   Marital status: Widowed    Spouse name: Not on file   Number of children: 1   Years of education: Not on file   Highest education level: Not on file  Occupational History   Occupation: Retired  Tobacco Use   Smoking status: Former    Current packs/day: 0.00    Average packs/day: 0.8 packs/day for 41.0 years (30.8 ttl pk-yrs)    Types: Cigarettes    Start date: 12/07/1974    Quit date: 12/08/2015    Years since quitting: 8.6   Smokeless tobacco: Never  Vaping Use   Vaping status: Never Used  Substance and Sexual Activity   Alcohol use: No   Drug use: No   Sexual activity: Yes    Birth control/protection: Post-menopausal  Other Topics Concern   Not on file  Social History Narrative   Not on file   Social Drivers of Health   Financial Resource Strain: Low Risk  (05/09/2024)   Overall Financial Resource Strain (CARDIA)    Difficulty of Paying Living Expenses: Not very hard  Food Insecurity: No Food Insecurity (05/09/2024)   Hunger Vital Sign    Worried About Running Out of Food in the Last Year: Never true    Ran Out of Food in the Last Year: Never true  Transportation Needs: No Transportation Needs (05/09/2024)    PRAPARE - Administrator, Civil Service (Medical): No    Lack of Transportation (Non-Medical): No  Physical Activity: Sufficiently Active (09/09/2023)   Exercise Vital Sign    Days of Exercise per Week: 7 days    Minutes of Exercise per Session: 30 min  Stress: No Stress Concern Present (05/09/2024)   [employer] of Occupational Health - Occupational Stress Questionnaire    Feeling of Stress : Only a little  Social Connections: Moderately Isolated (05/09/2024)   Social  Connection and Isolation Panel    Frequency of Communication with Friends and Family: More than three times a week    Frequency of Social Gatherings with Friends and Family: More than three times a week    Attends Religious Services: More than 4 times per year    Active Member of [employer] or Organizations: No    Attends Banker Meetings: Never    Marital Status: Widowed  Intimate Partner Violence: Not At Risk (05/09/2024)   Humiliation, Afraid, Rape, and Kick questionnaire    Fear of Current or Ex-Partner: No    Emotionally Abused: No    Physically Abused: No    Sexually Abused: No     Constitutional: Patient reports fatigue.  Denies fever, malaise, headache or abrupt weight changes.  HEENT: Denies eye pain, eye redness, ear pain, ringing in the ears, wax buildup, runny nose, nasal congestion, bloody nose, or sore throat. Respiratory: Patient reports cough, chest congestion, difficulty breathing and shortness of breath.   Cardiovascular: Denies chest pain, chest tightness, palpitations or swelling in the hands or feet.  Neurological: Denies dizziness, difficulty with memory, difficulty with speech or problems with balance and coordination.    No other specific complaints in a complete review of systems (except as listed in HPI above).      Objective:   Physical Exam  BP 112/70 (BP Location: Right Arm, Patient Position: Sitting, Cuff Size: Normal)   Pulse (!) 104   Ht 5' 4 (1.626 m)   Wt  121 lb (54.9 kg)   SpO2 97%   BMI 20.77 kg/m   Wt Readings from Last 3 Encounters:  06/15/24 122 lb 12.8 oz (55.7 kg)  05/16/24 123 lb 2 oz (55.8 kg)  05/09/24 120 lb (54.4 kg)    General: Appears older than her stated age, chronically ill-appearing, in NAD. HEENT: Head: normal shape and size;  Throat/Mouth: Teeth present, mucosa pink and moist, no exudate, lesions or ulcerations noted.  Neck: No adenopathy noted. Cardiovascular: Tachycardic with normal rhythm.  Pulmonary/Chest: Normal effort and diminished but clear breath sounds.  Increased respiratory effort without respiratory distress. No wheezes, rales or ronchi noted.  Musculoskeletal:  No difficulty with gait.  Neurological: Alert and oriented.   BMET    Component Value Date/Time   NA 138 02/04/2024 1010   NA 140 11/17/2021 1239   K 3.4 (L) 02/04/2024 1010   CL 103 02/04/2024 1010   CO2 26 02/04/2024 1010   GLUCOSE 92 02/04/2024 1010   BUN 9 02/04/2024 1010   BUN 12 11/17/2021 1239   CREATININE 0.46 02/04/2024 1010   CREATININE 0.58 01/20/2021 0801   CALCIUM  8.7 (L) 02/04/2024 1010   GFRNONAA >60 02/04/2024 1010   GFRNONAA 97 01/20/2021 0801   GFRAA 112 01/20/2021 0801    Lipid Panel     Component Value Date/Time   CHOL 165 12/22/2021 0952   TRIG 64 12/22/2021 0952   HDL 63 12/22/2021 0952   CHOLHDL 2.6 12/22/2021 0952   CHOLHDL 3.1 01/20/2021 0801   LDLCALC 89 12/22/2021 0952   LDLCALC 111 (H) 01/20/2021 0801    CBC    Component Value Date/Time   WBC 6.3 04/28/2024 1222   RBC 4.10 04/28/2024 1222   HGB 13.2 04/28/2024 1222   HGB 14.1 11/10/2018 1043   HCT 39.4 04/28/2024 1222   HCT 40.3 11/10/2018 1043   PLT 280 04/28/2024 1222   PLT 287 11/10/2018 1043   MCV 96.1 04/28/2024 1222   MCV 92  11/10/2018 1043   MCH 32.2 04/28/2024 1222   MCHC 33.5 04/28/2024 1222   RDW 12.0 04/28/2024 1222   RDW 11.8 (L) 11/10/2018 1043   LYMPHSABS 1.8 04/28/2024 1222   MONOABS 0.6 04/28/2024 1222   EOSABS 0.5  04/28/2024 1222   BASOSABS 0.1 04/28/2024 1222    Hgb A1C Lab Results  Component Value Date   HGBA1C 5.8 (H) 01/20/2021            Assessment & Plan:  Assessment and Plan    Severe chronic obstructive pulmonary disease (COPD) with chronic respiratory failure and increased sputum production Severe COPD with chronic respiratory failure, requiring oxygen  therapy. Increased sputum production noted, with no signs of bronchitis or pneumonia. Condition exacerbated by humidity. - Order chest x-ray to rule out pneumonia or other complications. - Continue breztri  and albuterol  as previously prescribed by pulmonology. - Continue azithromycin  as previously prescribed by pulmonology. - Continue prednisone  as prescribed until your course is completed. - Attend second opinion appointment at [facility] in two weeks. - Advised her that I would not prescribe additional antibiotics at this time unless chest x-ray was indicated of of infiltrate or pneumonia   Follow-up with your PCP as previously scheduled Angeline Laura, NP

## 2024-07-20 DIAGNOSIS — Z008 Encounter for other general examination: Secondary | ICD-10-CM | POA: Diagnosis not present

## 2024-07-20 DIAGNOSIS — Z515 Encounter for palliative care: Secondary | ICD-10-CM | POA: Diagnosis not present

## 2024-07-24 ENCOUNTER — Ambulatory Visit: Admitting: Family Medicine

## 2024-07-24 DIAGNOSIS — J449 Chronic obstructive pulmonary disease, unspecified: Secondary | ICD-10-CM | POA: Diagnosis not present

## 2024-08-02 ENCOUNTER — Other Ambulatory Visit: Payer: Self-pay

## 2024-08-02 ENCOUNTER — Other Ambulatory Visit: Payer: Self-pay | Admitting: Family Medicine

## 2024-08-02 DIAGNOSIS — J432 Centrilobular emphysema: Secondary | ICD-10-CM

## 2024-08-02 DIAGNOSIS — K219 Gastro-esophageal reflux disease without esophagitis: Secondary | ICD-10-CM

## 2024-08-02 MED ORDER — BREZTRI AEROSPHERE 160-9-4.8 MCG/ACT IN AERO: 2.0000 | INHALATION_SPRAY | Freq: Two times a day (BID) | RESPIRATORY_TRACT | 3 refills | Status: AC

## 2024-08-03 ENCOUNTER — Ambulatory Visit: Payer: Self-pay | Admitting: Family Medicine

## 2024-08-03 DIAGNOSIS — J301 Allergic rhinitis due to pollen: Secondary | ICD-10-CM | POA: Diagnosis not present

## 2024-08-03 DIAGNOSIS — Z7952 Long term (current) use of systemic steroids: Secondary | ICD-10-CM | POA: Diagnosis not present

## 2024-08-03 DIAGNOSIS — J4489 Other specified chronic obstructive pulmonary disease: Secondary | ICD-10-CM | POA: Diagnosis not present

## 2024-08-03 DIAGNOSIS — J439 Emphysema, unspecified: Secondary | ICD-10-CM | POA: Diagnosis not present

## 2024-08-03 NOTE — Telephone Encounter (Signed)
 Spoke to patient, albuterol  inhaler called in patient notified

## 2024-08-03 NOTE — Telephone Encounter (Signed)
 Requested Prescriptions  Pending Prescriptions Disp Refills   albuterol  (VENTOLIN  HFA) 108 (90 Base) MCG/ACT inhaler [Pharmacy Med Name: ALBUTEROL  SULFATE HFA 108 (90 BASE)] 8.5 g 2    Sig: INHALE 2 PUFFS INTO THE LUNGS EVERY 4 HOURS AS NEEDED FOR WHEEZING OR SHORTNESS OF BREATH     Pulmonology:  Beta Agonists 2 Passed - 08/03/2024 11:53 AM      Passed - Last BP in normal range    BP Readings from Last 1 Encounters:  07/18/24 112/70         Passed - Last Heart Rate in normal range    Pulse Readings from Last 1 Encounters:  07/18/24 (!) 104         Passed - Valid encounter within last 12 months    Recent Outpatient Visits           2 weeks ago Centrilobular emphysema (HCC)   [place] [facility] [place], Angeline ORN, NP   2 months ago Stage 3 severe COPD by GOLD classification [facility])    [place] [facility] [place], [name], DO   3 months ago Gastroesophageal reflux disease without esophagitis   [place] [facility] [place], [name], DO   6 months ago Centrilobular emphysema [facility])   [place] [facility] [place], [name], DO   6 months ago Acute non-recurrent frontal sinusitis   [place] [facility] [place], [name], DO               famotidine  (PEPCID ) 20 MG tablet [Pharmacy Med Name: FAMOTIDINE  20 MG TAB] 180 tablet 1    Sig: TAKE 1 TABLET BY MOUTH TWICE DAILY AS NEEDED     Gastroenterology:  H2 Antagonists Passed - 08/03/2024 11:53 AM      Passed - Valid encounter within last 12 months    Recent Outpatient Visits           2 weeks ago Centrilobular emphysema [facility])   [place] [facility] [place], Angeline ORN, NP   2 months ago Stage 3 severe COPD by GOLD classification [facility])   [place] [facility] [place], [name], DO   3 months ago Gastroesophageal reflux disease without esophagitis   [place] [facility] [place] [name], DO   6 months ago Centrilobular emphysema [facility])   [place] [facility] [place] [name], DO   6 months ago Acute non-recurrent frontal sinusitis   [place] [facility] [place], [name], [place]

## 2024-08-03 NOTE — Telephone Encounter (Signed)
 Copied from CRM [id number]. Topic: Clinical - Red Word Triage >> Aug 03, 2024 11:10 AM Jayma L wrote: Red Word that prompted transfer to Nurse Triage: patient said she is having shortness of breath due to not having her albuterol  asking for a refill asap , was put in chart yesterday at 1240pm advised it can take 3 business days and she said she needs this asap Reason for Disposition  [1] MILD longstanding difficulty breathing (e.g., minimal/no SOB at rest, SOB with walking, pulse < 100) AND [2] SAME as normal    Pt calling in for her refill on the albuterol  inhaler and Pepcid .   It was called in 08/02/2024.   I let her know there is a up to 3 day turn around time needed.   They have been submitted.  Answer Assessment - Initial Assessment Questions 1. RESPIRATORY STATUS: Describe your breathing? (e.g., wheezing, shortness of breath, unable to speak, severe coughing)      I'm having short of breath since last September.    I need my inhaler.   I'm about to run out of my albuterol  inhaler and it's a holiday weekend coming up.    I checked her chart and the albuterol  inhaler and Pepcid  were called in yesterday 08/02/2024.    I let her know there is a 3 day turn around period.   I will let the them know pt has called in but she needs to give them a 3 day turn around period.     When I eat I get indigestion.   I need my medicine for indigestion.   They called it in yesterday too.   The Pepcid .     2. ONSET: When did this breathing problem begin?      This is a chronic problem.   I'm out of my inhaler.  I called my pharmacy but they have not heard back from the office.     3. PATTERN Does the difficult breathing come and go, or has it been constant since it started?      It's from  4. SEVERITY: How bad is your breathing? (e.g., mild, moderate, severe)      I don't want to run out of my inhaler over the holiday weekend. 5. RECURRENT SYMPTOM: Have you had difficulty breathing before? If Yes, ask: When  was the last time? and What happened that time?      Yes this is a chronic issue.   Needed medications refilled. 6. CARDIAC HISTORY: Do you have any history of heart disease? (e.g., heart attack, angina, bypass surgery, angioplasty)      Not asked 7. LUNG HISTORY: Do you have any history of lung disease?  (e.g., pulmonary embolus, asthma, emphysema)     Yes 8. CAUSE: What do you think is causing the breathing problem?      I'm wanting my inhaler refilled so I won't run out. 9. OTHER SYMPTOMS: Do you have any other symptoms? (e.g., chest pain, cough, dizziness, fever, runny nose)     I get indigestion and need the Pepcid  refilled.   She didn't know the names of the medication for indigestion so I looked in her chart.  She has the Prilosec but said she also uses Pepcid  too.   It's been submitted for refill. 10. O2 SATURATION MONITOR:  Do you use an oxygen  saturation monitor (pulse oximeter) at home? If Yes, ask: What is your reading (oxygen  level) today? What is your usual oxygen  saturation reading? (e.g., 95%)  N/A 11. PREGNANCY: Is there any chance you are pregnant? When was your last menstrual period?       N/A 12. TRAVEL: Have you traveled out of the country in the last month? (e.g., travel history, exposures)       N/A  Protocols used: Breathing Difficulty-A-AH FYI Only or Action Required?: Action required by provider: medication refill request.  Patient was last seen in primary care on 07/18/2024 by Antonette Angeline ORN, NP.  Called Nurse Triage reporting Shortness of Breath.  Symptoms began yesterday.Pt said she is short of breath because she is out of her albuterol  inhaler.   She got the nebulizer solution but not the inhaler.  Refill request submitted 08/02/2024.   Also refill request for Pepcid .  Both are pending approval.     Interventions attempted: Prescription medications: refills submitted for refill 08/02/2024.  Symptoms are: stable.  Triage  Disposition: Call PCP Now  Patient/caregiver understands and will follow disposition?: Yes

## 2024-08-08 DIAGNOSIS — Z7982 Long term (current) use of aspirin: Secondary | ICD-10-CM | POA: Diagnosis not present

## 2024-08-08 DIAGNOSIS — R7303 Prediabetes: Secondary | ICD-10-CM | POA: Diagnosis not present

## 2024-08-08 DIAGNOSIS — E559 Vitamin D deficiency, unspecified: Secondary | ICD-10-CM | POA: Diagnosis not present

## 2024-08-08 DIAGNOSIS — M810 Age-related osteoporosis without current pathological fracture: Secondary | ICD-10-CM | POA: Diagnosis not present

## 2024-08-08 DIAGNOSIS — J440 Chronic obstructive pulmonary disease with acute lower respiratory infection: Secondary | ICD-10-CM | POA: Diagnosis not present

## 2024-08-08 DIAGNOSIS — J432 Centrilobular emphysema: Secondary | ICD-10-CM | POA: Diagnosis not present

## 2024-08-08 DIAGNOSIS — J9611 Chronic respiratory failure with hypoxia: Secondary | ICD-10-CM | POA: Diagnosis not present

## 2024-08-08 DIAGNOSIS — J441 Chronic obstructive pulmonary disease with (acute) exacerbation: Secondary | ICD-10-CM | POA: Diagnosis not present

## 2024-08-08 DIAGNOSIS — E785 Hyperlipidemia, unspecified: Secondary | ICD-10-CM | POA: Diagnosis not present

## 2024-08-08 DIAGNOSIS — I700 Atherosclerosis of aorta: Secondary | ICD-10-CM | POA: Diagnosis not present

## 2024-08-08 DIAGNOSIS — Z7983 Long term (current) use of bisphosphonates: Secondary | ICD-10-CM | POA: Diagnosis not present

## 2024-08-08 DIAGNOSIS — J189 Pneumonia, unspecified organism: Secondary | ICD-10-CM | POA: Diagnosis not present

## 2024-08-08 DIAGNOSIS — I2510 Atherosclerotic heart disease of native coronary artery without angina pectoris: Secondary | ICD-10-CM | POA: Diagnosis not present

## 2024-08-10 DIAGNOSIS — Z008 Encounter for other general examination: Secondary | ICD-10-CM | POA: Diagnosis not present

## 2024-08-10 DIAGNOSIS — Z515 Encounter for palliative care: Secondary | ICD-10-CM | POA: Diagnosis not present

## 2024-08-15 ENCOUNTER — Telehealth: Payer: Self-pay

## 2024-08-21 ENCOUNTER — Ambulatory Visit: Payer: Self-pay

## 2024-08-21 DIAGNOSIS — E538 Deficiency of other specified B group vitamins: Secondary | ICD-10-CM

## 2024-08-21 NOTE — Telephone Encounter (Signed)
 Spoke with patient, notified she will need to come have blood work done before we can call in vitamin b12. She will come to the office and have the labs completed.

## 2024-08-21 NOTE — Telephone Encounter (Signed)
 Specialist attempted warm transfer, patient disconnected.      Copied from CRM #[id number]. Topic: Appointments - Appointment Scheduling >> Aug 21, 2024  9:32 AM Turkey B wrote: Patient has sob Answer Assessment - Initial Assessment Questions Patient requesting Vitamin B shot.  Pt has appointment today at 2:30 PM with pulmonology.Would like to get shot today, around 3:30 PM or 4PM today.  Answer Assessment - Initial Assessment Questions Patient uses continuous oxygen , has pulmonologist appt today at 2:30PM, but requesting Vit B shot for energy.  1. DESCRIPTION: Describe how you are feeling.     Low energy since oxygent been low  2. SEVERITY: How bad is it?  Can you stand and walk?     Yes  3. ONSET: When did these symptoms begin? (e.g., hours, days, weeks, months)     Week ago  4. CAUSE: What do you think is causing the weakness or fatigue? (e.g., not drinking enough fluids, medical problem, trouble sleeping)     Low energy  5. OTHER SYMPTOMS: Do you have any other symptoms? (e.g., chest pain, fever, cough, SOB, vomiting, diarrhea, bleeding, other areas of pain)     denies  Protocols used: Information Only Call - No Triage-A-AH, Weakness (Generalized) and Fatigue-A-AH

## 2024-08-21 NOTE — Telephone Encounter (Signed)
 FYI Only or Action Required?: Action required by provider: clinical question for provider.  Patient was last seen in primary care on 07/18/2024 by Antonette Angeline ORN, NP.  Called Nurse Triage reporting Shortness of Breath.  Symptoms began a week ago.  Interventions attempted: Nothing.  Symptoms are: unchanged.  Triage Disposition: No disposition on file.  Patient/caregiver understands and will follow disposition?:

## 2024-08-21 NOTE — Telephone Encounter (Signed)
 I don't believe she is receiving B12 shots from our office.  She can do a blood test for B12 if she wants today first before we consider injections  Brandi Officer, DO [facility] 08/21/2024, 10:29 AM

## 2024-08-24 DIAGNOSIS — J449 Chronic obstructive pulmonary disease, unspecified: Secondary | ICD-10-CM | POA: Diagnosis not present

## 2024-08-31 ENCOUNTER — Ambulatory Visit: Payer: Self-pay

## 2024-08-31 DIAGNOSIS — J449 Chronic obstructive pulmonary disease, unspecified: Secondary | ICD-10-CM | POA: Diagnosis not present

## 2024-08-31 DIAGNOSIS — Z008 Encounter for other general examination: Secondary | ICD-10-CM | POA: Diagnosis not present

## 2024-08-31 NOTE — Telephone Encounter (Signed)
 FYI Only or Action Required?: Action required by provider: request for appointment.  Patient was last seen in primary care on 07/18/2024 by Antonette Angeline ORN, NP.  Called Nurse Triage reporting Fatigue.  Symptoms began several weeks ago.  Interventions attempted: Rest, hydration, or home remedies.  Symptoms are: gradually worsening.  Triage Disposition: See Physician Within 24 Hours  Patient/caregiver understands and will follow disposition?: YesCopied from CRM #[id number]. Topic: Clinical - Red Word Triage >> Aug 31, 2024 11:58 AM Debby BROCKS wrote: [facility] that prompted transfer to Nurse Triage: Increased shortness of breath and would like to see PCP Reason for Disposition  [1] MODERATE weakness (e.g., interferes with work, school, normal activities) AND [2] persists > 3 days  Answer Assessment - Initial Assessment Questions Christine, NP telehealth,  called with concerns of weakness and SOB. Pt wears up to 4L of oxygen  depending on saturation levels. Pt's O2 sat without oxygen  was 93% and 98% on 4L. NP advised pt to take off while resting. Pt wears O2 while walking around house. Pt stated she feels weak because she can't breathe at times. Pt requested to only see PCP. Appt tomorrow at 11:20.       1. DESCRIPTION: Describe how you are feeling.     No energy 3. ONSET: When did these symptoms begin? (e.g., hours, days, weeks, months)     2 weeks ago  4. CAUSE: What do you think is causing the weakness or fatigue? (e.g., not drinking enough fluids, medical problem, trouble sleeping)     No sure 5. NEW MEDICINES:  Have you started on any new medicines recently? (e.g., opioid pain medicines, benzodiazepines, muscle relaxants, antidepressants, antihistamines, neuroleptics, beta blockers)     denies 6. OTHER SYMPTOMS: Do you have any other symptoms? (e.g., chest pain, fever, cough, SOB, vomiting, diarrhea, bleeding, other areas of pain)     SOB  Protocols used: Weakness  (Generalized) and Fatigue-A-AH

## 2024-09-01 ENCOUNTER — Ambulatory Visit (INDEPENDENT_AMBULATORY_CARE_PROVIDER_SITE_OTHER): Admitting: Family Medicine

## 2024-09-01 ENCOUNTER — Encounter: Payer: Self-pay | Admitting: Family Medicine

## 2024-09-01 VITALS — BP 110/72 | HR 97 | Ht 64.0 in | Wt 118.2 lb

## 2024-09-01 DIAGNOSIS — F411 Generalized anxiety disorder: Secondary | ICD-10-CM | POA: Diagnosis not present

## 2024-09-01 DIAGNOSIS — E559 Vitamin D deficiency, unspecified: Secondary | ICD-10-CM | POA: Diagnosis not present

## 2024-09-01 DIAGNOSIS — J432 Centrilobular emphysema: Secondary | ICD-10-CM | POA: Diagnosis not present

## 2024-09-01 DIAGNOSIS — F410 Panic disorder [episodic paroxysmal anxiety] without agoraphobia: Secondary | ICD-10-CM

## 2024-09-01 DIAGNOSIS — J440 Chronic obstructive pulmonary disease with acute lower respiratory infection: Secondary | ICD-10-CM

## 2024-09-01 DIAGNOSIS — E782 Mixed hyperlipidemia: Secondary | ICD-10-CM

## 2024-09-01 DIAGNOSIS — J9611 Chronic respiratory failure with hypoxia: Secondary | ICD-10-CM

## 2024-09-01 DIAGNOSIS — D509 Iron deficiency anemia, unspecified: Secondary | ICD-10-CM

## 2024-09-01 DIAGNOSIS — J209 Acute bronchitis, unspecified: Secondary | ICD-10-CM | POA: Diagnosis not present

## 2024-09-01 DIAGNOSIS — E538 Deficiency of other specified B group vitamins: Secondary | ICD-10-CM

## 2024-09-01 LAB — CBC WITH DIFFERENTIAL/PLATELET
Absolute Lymphocytes: 2190 {cells}/uL (ref 850–3900)
Absolute Monocytes: 782 {cells}/uL (ref 200–950)
Basophils Absolute: 92 {cells}/uL (ref 0–200)
Basophils Relative: 1 %
Eosinophils Absolute: 920 {cells}/uL — ABNORMAL HIGH (ref 15–500)
Eosinophils Relative: 10 %
HCT: 42.4 % (ref 35.0–45.0)
Hemoglobin: 13.8 g/dL (ref 11.7–15.5)
MCH: 32.1 pg (ref 27.0–33.0)
MCHC: 32.5 g/dL (ref 32.0–36.0)
MCV: 98.6 fL (ref 80.0–100.0)
MPV: 9.9 fL (ref 7.5–12.5)
Monocytes Relative: 8.5 %
Neutro Abs: 5216 {cells}/uL (ref 1500–7800)
Neutrophils Relative %: 56.7 %
Platelets: 341 Thousand/uL (ref 140–400)
RBC: 4.3 Million/uL (ref 3.80–5.10)
RDW: 11.5 % (ref 11.0–15.0)
Total Lymphocyte: 23.8 %
WBC: 9.2 Thousand/uL (ref 3.8–10.8)

## 2024-09-01 LAB — BASIC METABOLIC PANEL WITH GFR
BUN: 7 mg/dL (ref 7–25)
CO2: 32 mmol/L (ref 20–32)
Calcium: 9.4 mg/dL (ref 8.6–10.4)
Chloride: 100 mmol/L (ref 98–110)
Creat: 0.57 mg/dL (ref 0.50–1.05)
Glucose, Bld: 104 mg/dL — ABNORMAL HIGH (ref 65–99)
Potassium: 3.7 mmol/L (ref 3.5–5.3)
Sodium: 139 mmol/L (ref 135–146)
eGFR: 99 mL/min/1.73m2 (ref >=60)

## 2024-09-01 LAB — T4, FREE: Free T4: 1.2 ng/dL (ref 0.8–1.8)

## 2024-09-01 LAB — VITAMIN B12: Vitamin B-12: 275 pg/mL (ref 200–1100)

## 2024-09-01 LAB — VITAMIN D 25 HYDROXY (VIT D DEFICIENCY, FRACTURES): Vit D, 25-Hydroxy: 50 ng/mL (ref 30–100)

## 2024-09-01 LAB — TSH: TSH: 0.85 m[IU]/L (ref 0.40–4.50)

## 2024-09-01 MED ORDER — BUSPIRONE HCL 7.5 MG PO TABS: 7.5000 mg | ORAL_TABLET | Freq: Two times a day (BID) | ORAL | 1 refills | Status: DC | PRN

## 2024-09-01 MED ORDER — SULFAMETHOXAZOLE-TRIMETHOPRIM 800-160 MG PO TABS: 1.0000 | ORAL_TABLET | Freq: Two times a day (BID) | ORAL | 0 refills | Status: AC

## 2024-09-01 NOTE — Progress Notes (Signed)
 Subjective:    Patient ID: Brandi [name], female    DOB: Dec 18, 1955, 68 y.o.   MRN: [id number]  Brandi [name] is a 68 y.o. female presenting on 09/01/2024 for COPD  Patient presents for a same day appointment.   HPI  Discussed the use of AI scribe software for clinical note transcription with the patient, who gave verbal consent to proceed.  History of Present Illness   Brandi [name] is a 68 year old female with COPD who presents with shortness of breath and weakness.  Severe COPD / Emphysema Bronchitis / Dyspnea and respiratory symptoms - Persistent shortness of breath and weakness since last visit - Congestion and productive cough continue - Recent course of steroids provided temporary relief for a couple of days - Uses nebulizer with Ohtuvayre , which is costly - On Breztri  and oxygen  as needed - Improvement in breathing when [place] environment - Rib pain present Left Flank  Previously followed by [facility] Pulmonology Dr Tamea now established with [facility] Pul[facility] Dr Parris  Antibiotic therapy and medication adherence - On Bactrim  (sulfa ) three times a week for ninety days - Initial misunderstanding of dosage instructions, took three times a day instead of three times a week - Initially effective, but no longer providing benefit - One pill left for the week, unsure about refill status - Sulfa  drug is easier on her stomach and does not cause yeast infections  Functional status and fatigue - Lack of energy and inability to perform daily activities without assistance - Requires help from family members for daily care - Home health visits have been limited in duration and effectiveness  Anxiety and panic symptoms - Experiences anxiety and panic, especially when unable to breathe properly - Currently taking Buspar  5 mg twice a day for anxiety - Buspar  is insufficient during panic episodes  Cognitive impairment - Forgetfulness and difficulty thinking  clearly - Attributes cognitive symptoms to breathing issues           09/01/2024   11:35 AM 05/16/2024    3:03 PM 05/09/2024    4:39 PM  Depression screen PHQ 2/9  Decreased Interest 1 0 0  Down, Depressed, Hopeless 0 0 0  PHQ - 2 Score 1 0 0  Altered sleeping  0   Tired, decreased energy 2 1   Change in appetite 0    Feeling bad or failure about yourself  0 0   Trouble concentrating 0 0   Moving slowly or fidgety/restless 0 0   Suicidal thoughts 0 0   PHQ-9 Score  1   Difficult doing work/chores Somewhat difficult Somewhat difficult        09/01/2024   11:36 AM 05/16/2024    3:03 PM 04/24/2024    2:47 PM 03/26/2023   10:03 AM  GAD 7 : Generalized Anxiety Score  Nervous, Anxious, on Edge 1 0 1 0  Control/stop worrying 0 0 0 0  Worry too much - different things 0 0 0 0  Trouble relaxing 0 0 0 0  Restless 0 0 0 0  Easily annoyed or irritable 0 0 0 0  Afraid - awful might happen 0 0 0 0  Total GAD 7 Score 1 0 1 0  Anxiety Difficulty Somewhat difficult Not difficult at all Somewhat difficult Not difficult at all    Social History   Tobacco Use   Smoking status: Former    Current packs/day: 0.00    Average packs/day: 0.8 packs/day for 41.0 years (30.8 ttl  pk-yrs)    Types: Cigarettes    Start date: 12/07/1974    Quit date: 12/08/2015    Years since quitting: 8.7   Smokeless tobacco: Never  Vaping Use   Vaping status: Never Used  Substance Use Topics   Alcohol use: No   Drug use: No    Review of Systems Per HPI unless specifically indicated above     Objective:    BP 110/72 (BP Location: Right Arm, Patient Position: Sitting, Cuff Size: Normal)   Pulse 97   Ht 5' 4 (1.626 m)   Wt 118 lb 4 oz (53.6 kg)   SpO2 100%   BMI 20.30 kg/m   Wt Readings from Last 3 Encounters:  09/01/24 118 lb 4 oz (53.6 kg)  07/18/24 121 lb (54.9 kg)  06/15/24 122 lb 12.8 oz (55.7 kg)    Physical Exam Vitals and nursing note reviewed.  Constitutional:      General: She is not  in acute distress.    Appearance: She is well-developed. She is not diaphoretic.     Comments: Chronically ill appearing but currently well, comfortable, cooperative  HENT:     Head: Normocephalic and atraumatic.  Eyes:     General:        Right eye: No discharge.        Left eye: No discharge.     Conjunctiva/sclera: Conjunctivae normal.  Neck:     Thyroid : No thyromegaly.  Cardiovascular:     Rate and Rhythm: Normal rate and regular rhythm.     Heart sounds: Normal heart sounds. No murmur heard. Pulmonary:     Effort: Pulmonary effort is normal. No respiratory distress.     Breath sounds: Wheezing present. No rales.     Comments: Reduced breath sounds at baseline. Frequent cough to clear mucus  Has portable oxygen , not wearing during visit when seated Musculoskeletal:        General: Normal range of motion.     Cervical back: Normal range of motion and neck supple.     Right lower leg: No edema.     Left lower leg: No edema.  Lymphadenopathy:     Cervical: No cervical adenopathy.  Skin:    General: Skin is warm and dry.     Findings: No erythema or rash.  Neurological:     Mental Status: She is alert and oriented to person, place, and time.  Psychiatric:        Behavior: Behavior normal.     Comments: Well groomed, good eye contact, normal speech and thoughts     Results for orders placed or performed in visit on 05/16/24  Ova and parasite examination   Collection Time: 05/17/24  2:46 PM   Specimen: Stool  Result Value Ref Range   MICRO NUMBER: [id number]    SPECIMEN QUALITY: Adequate    Source STOOL    STATUS: FINAL    CONCENTRATE RESULT: No ova or parasites seen    TRICHROME RESULT: No ova or parasites seen    COMMENT:      Routine Ova and Parasite exam may not detect some parasites that occasionally cause diarrheal illness. Cryptosporidium Antigen and/or Cyclospora and Isospora Exam may be ordered to detect these parasites. One negative sample does not  necessarily rule out  the presence of a parasitic infection.  For additional information, please refer to https://education.questdiagnostics.com/faq/FAQ203 (This link is being provided for informational/ educational purposes only.)       Assessment & Plan:  Problem List Items Addressed This Visit     Centrilobular emphysema (HCC)   Relevant Orders   T4, free   TSH   CBC with Differential/Platelet   Basic metabolic panel with GFR   Generalized anxiety disorder with panic attacks   Relevant Medications   busPIRone  (BUSPAR ) 7.5 MG tablet   Hyperlipidemia   Relevant Orders   T4, free   TSH   Vitamin D  deficiency   Relevant Orders   VITAMIN D  25 Hydroxy (Vit-D Deficiency, Fractures)   Other Visit Diagnoses       Acute bronchitis with COPD (HCC)    -  Primary   Relevant Medications   sulfamethoxazole -trimethoprim  (BACTRIM  DS) 800-160 MG tablet   Other Relevant Orders   DG Chest 2 View     Chronic hypoxemic respiratory failure (HCC)         B12 deficiency       Relevant Orders   Vitamin B12     Iron deficiency anemia, unspecified iron deficiency anemia type       Relevant Orders   CBC with Differential/Platelet        Severe Emphysema Chronic obstructive pulmonary disease (COPD) Chronic respiratory failure with hypoxia  Gradual worsening progressive chronic COPD with emphysema and respiratory failure, leading to hypoxia.  Followed by Pulmonology various locations in past, now recently established with [facility] Dr Aleskerov for new opinion provider  Financial constraints limit access to Dupixent and Ohtuvayre  nebulizer. Breztri  and oxygen  therapy ongoing. Environmental factors may influence symptoms given improved in some environment when in mountains and more difficulty breathing here  She is on maximal therapy at this time and is managed by Pulmonology given her severity of COPD. Still symptomatic productive sputum and dyspnea despite therapy This has been  an ongoing discussion with our office and her previous Pulm specialists that we are trying to maximize her therapy to give her relief, but unfortunately she is not seeing dramatic results or improvement as she is expecting.  She is on Bactrim  /Sulfa  antibiotic 3 times a week for prevention. Now having flare, asking for daily dosing burst. - Prescribe Bactrim  twice a day for seven days, then return to three times a week.  Now she is no longer able to afford Ohtuvayre  and new Dupixent. Ohtuvayre  was covered before now high cost, and Dupixent injections are new per Pulm just got approved from their office, [facility], however out of pocket cost is $900 per injection to her and she cannot afford  I advised that the best I can do is contact our clinical pharmacist to inquire about any potential coverage / savings programs for these two but I cannot guarantee that this will be available.  - Continue Breztri  and oxygen  therapy as needed. - Explore options for home respiratory therapy services. She is also interested in Personal Care Services or Respiratory Therapy in the home to help her, again I will contact our VBCI team but I am not sure this is a covered service  - Consider x-ray if symptoms persist to rule out pneumonia. We do not have X-ray on Friday. She will walk in Monday next week - Discuss use of mullein and ginger mixture for cough relief. She asks about herbal supplement. I advised her as did her previous Pulmonologist she may try this.  Fatigue and weakness Fatigue and weakness possibly related to respiratory issues and hypoxia. Symptoms worsened since last blood work in May showed no anemia. - Order blood tests including blood count,  vitamin B12, vitamin D , thyroid , and chemistry panel. - Schedule blood draw after lunch.  Anxiety disorder Anxiety disorder with panic episodes exacerbated by respiratory issues. Current Buspar  dosage insufficient. I agree that anxiety of hers  can definitely worsen her breathing. She may benefit from benzodiazpine in future if this treatment is ineffective  - Increase Buspar  dosage to 7.5 mg (from 5mg  previous dose) - Discussed potential side effects of higher doses and alternative medications like clonazepam, Xanax , Ativan, but noted risks.  Forgetfulness Forgetfulness possibly related to hypoxia and age-related cognitive changes.   VBCI Ask Sharyle about Dupixent savings programs, [facility], approved but $900 per shot Check on Ohtuvayre  financial assistance Inquire about program for home respiratory therapist  Ask about Personal Care Services eligibility   Route chart to [facility] team Felecia McCray RN and Sharyle Sia [facility] CPP  Orders Placed This Encounter  Procedures   DG Chest 2 View    Standing Status:   Future    Expiration Date:   09/01/2025    Reason for Exam (SYMPTOM  OR DIAGNOSIS REQUIRED):   acute on chronic COPD, bronchitis    Preferred imaging location?:   [facility]   T4, free   TSH   CBC with Differential/Platelet   Basic metabolic panel with GFR   Vitamin B12   VITAMIN D  25 Hydroxy (Vit-D Deficiency, Fractures)    Meds ordered this encounter  Medications   sulfamethoxazole -trimethoprim  (BACTRIM  DS) 800-160 MG tablet    Sig: Take 1 tablet by mouth 2 (two) times daily for 7 days. For acute COPD flare    Dispense:  14 tablet    Refill:  0   busPIRone  (BUSPAR ) 7.5 MG tablet    Sig: Take 1 tablet (7.5 mg total) by mouth 2 (two) times daily as needed. for anxiety    Dispense:  180 tablet    Refill:  1    Dose inc from 5 to 7.5    Follow up plan: Return if symptoms worsen or fail to improve.   [name], DO [facility] 09/01/2024, 11:45 AM

## 2024-09-01 NOTE — Patient Instructions (Addendum)
 Thank you for coming to the office today.  I have ordered Bactrim  antibiotic twice a day for 7 days then resume the 3 times a week dosing  I will ask Jackson and Sharyle our team about savings program for Dupixent and Ohtuvayre  options  Also we can check into availability of Home Respiratory Therapist and Personal Care Service Home Aide  OTC Mullein supplement  Walk in for X-ray Mon-Thurs here if you are interested.  Please schedule a Follow-up Appointment to: Return if symptoms worsen or fail to improve.  If you have any other questions or concerns, please feel free to call the office or send a message through MyChart. You may also schedule an earlier appointment if necessary.  Additionally, you may be receiving a survey about your experience at our office within a few days to 1 week by e-mail or mail. We value your feedback.  [name], DO [facility], [place]

## 2024-09-04 ENCOUNTER — Encounter: Payer: Self-pay | Admitting: Pharmacist

## 2024-09-04 ENCOUNTER — Other Ambulatory Visit: Payer: Self-pay | Admitting: Family Medicine

## 2024-09-04 ENCOUNTER — Other Ambulatory Visit (HOSPITAL_COMMUNITY): Payer: Self-pay

## 2024-09-04 ENCOUNTER — Ambulatory Visit: Payer: Self-pay | Admitting: Family Medicine

## 2024-09-04 ENCOUNTER — Telehealth: Payer: Self-pay

## 2024-09-04 ENCOUNTER — Other Ambulatory Visit (INDEPENDENT_AMBULATORY_CARE_PROVIDER_SITE_OTHER): Admitting: Pharmacist

## 2024-09-04 DIAGNOSIS — E538 Deficiency of other specified B group vitamins: Secondary | ICD-10-CM

## 2024-09-04 DIAGNOSIS — J432 Centrilobular emphysema: Secondary | ICD-10-CM

## 2024-09-04 DIAGNOSIS — R0602 Shortness of breath: Secondary | ICD-10-CM | POA: Diagnosis not present

## 2024-09-04 DIAGNOSIS — Z008 Encounter for other general examination: Secondary | ICD-10-CM | POA: Diagnosis not present

## 2024-09-04 MED ORDER — CYANOCOBALAMIN 1000 MCG/ML IJ SOLN: 1000.0000 ug | INTRAMUSCULAR | 0 refills | Status: DC

## 2024-09-04 NOTE — Telephone Encounter (Signed)
 Routed lab result note to clinical pool for patient to be contacted with lab results. See note  [name], DO [facility] 09/04/2024, 4:22 PM

## 2024-09-04 NOTE — Telephone Encounter (Signed)
 Pharmacy Patient Advocate Encounter  Insurance verification completed.   The patient is insured through [facility]   Ran test claim for Breztri . Currently a quantity of 10.7 is a 30 day supply and the co-pay is $155.03 .   This test claim was processed through [facility]- copay amounts may vary at other pharmacies due to pharmacy/plan contracts, or as the patient moves through the different stages of their insurance plan.

## 2024-09-04 NOTE — Progress Notes (Signed)
 09/04/2024 Name: Brandi [name] MRN: [id number] DOB: 03-17-1956  Chief Complaint  Patient presents with   Medication Assistance    Brandi [name] is a 69 y.o. year old female who presented for a telephone visit.   They were referred to the pharmacist by their PCP for assistance in managing medication access.    Subjective:  Care Team: Primary Care Provider: Edman [name], DO Pulmonologist: Parris Manna, MD; Next Scheduled Visit: 10/25/2024  Medication Access/Adherence  Current Pharmacy:  [facility] [place], [place] - [address] [place] [address] Phone: [phone] Fax: [phone]  [id number] [place], [place] - [address] AT [facility] OF [address] [place] [address] [place] [address] Phone: [phone] Fax: [phone]  [id number], [place] - [address] AT [facility] OF [address] [facility] [place] [address] Phone: [phone] Fax: [phone]  [facility] [place], [place] - [address] [place] [address] Phone: [phone] Fax: [phone]  [facility] - [place], [place] - [address] [place] [address] Phone: [phone] Fax: [phone]   Patient reports affordability concerns with their medications: Yes  Patient reports access/transportation concerns to their pharmacy: No  Patient reports adherence concerns with their medications:  No    From review of chart, note patient followed by Pulmonology various locations in past, now recently established with [facility] Dr Parris   Patient seen for Office Visit with PCP on 09/01/2024 related to COPD same day appointment. Provider advised patient: - Start Bactrim  antibiotic twice a day for 7 days then resume the 3 times a week dosing    COPD:  Currently followed by [facility]ology  Current medications: - Breztri  inhaler - 2  puffs in morning and at bedtime - albuterol  inhaler - 2 puffs every 4 hours as needed - albuterol  nebulizer solution - 1 vial via nebulizer every 6 hours as needed - prednisone  5 mg daily - Bactrim  DS - 1 tablet twice daily Confirms plans to complete 7 day course of this dose as prescribe by PCP and then resume lower dose 3 times a week dosing as prescribed by Pulmonologist - On oxygen   Reports was previously taking Ohtuvayre  via nebulizer twice daily from samples from previous Pulmonologist, but cost became unaffordable  Reports Pulmonologist Dr. Aleskerov also recommended that she start Dupixent injections, but that these are also unaffordable through her prescription coverage   Objective:  Lab Results  Component Value Date   CREATININE 0.57 09/01/2024   BUN 7 09/01/2024   NA 139 09/01/2024   K 3.7 09/01/2024   CL 100 09/01/2024   CO2 32 09/01/2024    Lab Results  Component Value Date   CHOL 165 12/22/2021   HDL 63 12/22/2021   LDLCALC 89 12/22/2021   TRIG 64 12/22/2021   CHOLHDL 2.6 12/22/2021    Medications Reviewed Today     Reviewed by Alana Sharyle LABOR, RPH-CPP (Pharmacist) on 09/04/24 at 1203  Med List Status: <None>   Medication Order Taking? Sig Documenting Provider Last Dose Status Informant  albuterol  (PROVENTIL ) (2.5 MG/3ML) 0.083% nebulizer solution [id number]  Take 3 mLs (2.5 mg total) by nebulization every 6 (six) hours as needed for wheezing or shortness of breath. Tamea Dedra CROME, MD  Active   albuterol  (VENTOLIN  HFA) 108 [phone])  MCG/ACT inhaler [id number]  INHALE 2 PUFFS INTO THE LUNGS EVERY 4 HOURS AS NEEDED FOR WHEEZING OR SHORTNESS OF BREATH [name], [name], DO  Active   alendronate  (FOSAMAX ) 70 MG tablet [id number]  Take 1 tablet (70 mg total) by mouth once a week. Edman [name], DO  Active   aspirin  EC 81 MG tablet [id number]  Take 1 tablet (81 mg total) by mouth daily. Gollan, [name], MD  Active Self   budesonide-glycopyrrolate -formoterol (BREZTRI  AEROSPHERE) 160-9-4.8 MCG/ACT AERO inhaler [id number]  Inhale 2 puffs into the lungs in the morning and at bedtime. Tamea Dedra CROME, MD  Active   busPIRone  (BUSPAR ) 7.5 MG tablet [id number]  Take 1 tablet (7.5 mg total) by mouth 2 (two) times daily as needed. for anxiety Edman [name], DO  Active   Calcium  Carbonate-Vit D-Min (CALCIUM  1200) 1200-1000 MG-UNIT CHEW [id number]  Chew 1,200 mg by mouth daily. [name], [name], MD  Active   Cholecalciferol (VITAMIN D3) 125 MCG (5000 UT) TABS [id number]  Take by mouth. Vitamin D3 5,000 IU daily for 12 weeks then reduce to OTC Vitamin D3 2,000 IU daily for maintenance [provider]  Active   ezetimibe  (ZETIA ) 10 MG tablet [id number]  Take 1 tablet (10 mg total) by mouth daily. Gollan, [name], MD  Active   famotidine  (PEPCID ) 20 MG tablet [id number]  TAKE 1 TABLET BY MOUTH TWICE DAILY AS NEEDED Edman, [name], DO  Active   feeding supplement (ENSURE ENLIVE / ENSURE PLUS) LIQD [id number]  Take 237 mLs by mouth 2 (two) times daily between meals. Fausto Burnard LABOR, DO  Active   montelukast  (SINGULAIR ) 10 MG tablet [id number]  TAKE 1 TABLET BY MOUTH AT BEDTIME Edman [name], DO  Active   Multiple Vitamin (MULTIVITAMIN WITH MINERALS) TABS tablet [id number]  Take 1 tablet by mouth daily. Fausto Burnard LABOR, DO  Active            Med Note [name], FELECIA N   Wed Nov 17, 2023  2:48 PM) Reports not taking daily  naproxen  (NAPROSYN ) 500 MG tablet [id number]  Take 1 tablet (500 mg total) by mouth 2 (two) times daily with a meal. For 1-2 weeks then as needed Edman [name], DO  Active   OHTUVAYRE  3 MG/2.5ML SUSP [id number]  Inhale 3 mg into the lungs 2 (two) times daily. Tamea Dedra CROME, MD  Active   omeprazole  (PRILOSEC) 20 MG capsule [id number]  Take 1 capsule (20 mg total) by mouth 2 (two) times daily before a meal. Edman [name], DO  Active   OXYGEN   [id number]  Inhale 3 L into the lungs at bedtime as needed. [provider]  Active            Med Note [name], [place] N   Wed Nov 17, 2023  2:51 PM) Reports currently using 4 min/l  predniSONE  (DELTASONE ) 5 MG tablet [id number] Yes Take 5 mg by mouth daily. [provider]  Active   rosuvastatin  (CRESTOR ) 10 MG tablet [id number]  Take 1 tablet (10 mg total) by mouth daily. Gollan, [name], MD  Active            Med Note [name], [name] Nov 17, 2023  2:49 PM) Reports not taking daily. Reports taking a few times a week at night.  sodium chloride  (OCEAN) 0.65 % SOLN nasal spray [id number]  Place 1 spray into both nostrils as needed for congestion. [provider]  Active  Specialty Vitamins Products (HAIR NOURISHING SUPPLEMENT PO) [id number]  Take 1 tablet by mouth daily. [provider]  Active   sulfamethoxazole -trimethoprim  (BACTRIM  DS) 800-160 MG tablet [id number]  Take 1 tablet by mouth 2 (two) times daily for 7 days. For acute COPD flare Edman, [name], DO  Active   sulfamethoxazole -trimethoprim  (BACTRIM ) 400-80 MG tablet [id number]  Take 1 tablet by mouth 3 (three) times a week. [provider]  Active   tiZANidine  (ZANAFLEX ) 2 MG tablet [id number]  Take 1 tablet (2 mg total) by mouth every 8 (eight) hours as needed for muscle spasms. Edman [name], DO  Active   triamcinolone  cream (KENALOG ) 0.5 % [id number]  Apply 1 Application topically 2 (two) times daily as needed (allergic reaction on skin swelling). To affected areas, for up to 2 weeks. Edman [name], DO  Active               Assessment/Plan:   Counsel patient on [employer]' [employer] Information Program [facility]) as requests help with selecting her Medicare plan for next calendar year - Send patient contact information for [facility] via MyChart as requested  COPD: - Reviewed appropriate inhaler technique. - Based on reported income,  patient meets income limit for Extra Help from [employer].   Assist patient with completing this application online today Counsel to expect response regarding approval or denial from Social Security via mail within the next 4 to 6 weeks after submitting application online. Advise that even if she/he is denied, to retain denial letter as this will be needed for applying for manufacturer patient assistance program.  - Recommend to follow up with [facility] to determine if office has any samples of Ohtuvayre  while patient applying for Extra Help subsidy  Follow Up Plan: Clinical Pharmacist will follow up with patient by telephone on 10/16/2024 at 1:00 PM   Sharyle Sia, PharmD, [facility] Clinical Pharmacist [facility] [phone]

## 2024-09-04 NOTE — Patient Instructions (Addendum)
 You can find more information about the [employer]' [employer] Information Program [facility]) here:   [email or web address]   You can reach the [employer] for [facility] by calling:   Norleen [facility] [address]     [place]  [address] [phone]     Ask for help with [facility]    We submitted you Extra Help application to Social Security online today. Please watch for a response from Social Security by mail within the next 4 to 6 weeks to let you know if this application is approved or denied. For either outcome, please keep this letter for your records.     Thank you!   Sharyle Sia, PharmD, [facility] Clinical Pharmacist [facility] [phone]

## 2024-09-04 NOTE — Telephone Encounter (Signed)
 Copied from CRM #[id number]. Topic: Clinical - Lab/Test Results >> Sep 04, 2024  2:58 PM Leonette P wrote: Reason for CRM: patient would like someone to call her back about her lab blood results.  [phone]

## 2024-09-04 NOTE — Telephone Encounter (Signed)
Patient advised of lab results

## 2024-09-05 ENCOUNTER — Ambulatory Visit (INDEPENDENT_AMBULATORY_CARE_PROVIDER_SITE_OTHER)

## 2024-09-05 ENCOUNTER — Other Ambulatory Visit: Payer: Self-pay | Admitting: Pulmonary Disease

## 2024-09-05 DIAGNOSIS — E538 Deficiency of other specified B group vitamins: Secondary | ICD-10-CM | POA: Diagnosis not present

## 2024-09-05 MED ORDER — CYANOCOBALAMIN 1000 MCG/ML IJ SOLN
1000.0000 ug | Freq: Once | INTRAMUSCULAR | Status: AC
  Administered 2024-09-05: 1000 ug via INTRAMUSCULAR

## 2024-09-06 ENCOUNTER — Ambulatory Visit

## 2024-09-07 DIAGNOSIS — R0602 Shortness of breath: Secondary | ICD-10-CM | POA: Diagnosis not present

## 2024-09-07 DIAGNOSIS — Z008 Encounter for other general examination: Secondary | ICD-10-CM | POA: Diagnosis not present

## 2024-09-09 ENCOUNTER — Emergency Department

## 2024-09-09 ENCOUNTER — Inpatient Hospital Stay
Admission: EM | Admit: 2024-09-09 | Discharge: 2024-09-15 | DRG: 189 | Disposition: A | Discharge status: Home Health | Attending: Internal Medicine | Admitting: Internal Medicine

## 2024-09-09 ENCOUNTER — Other Ambulatory Visit: Payer: Self-pay

## 2024-09-09 DIAGNOSIS — I5032 Chronic diastolic (congestive) heart failure: Secondary | ICD-10-CM | POA: Diagnosis present

## 2024-09-09 DIAGNOSIS — Z79899 Other long term (current) drug therapy: Secondary | ICD-10-CM

## 2024-09-09 DIAGNOSIS — Z7952 Long term (current) use of systemic steroids: Secondary | ICD-10-CM

## 2024-09-09 DIAGNOSIS — E785 Hyperlipidemia, unspecified: Secondary | ICD-10-CM | POA: Diagnosis present

## 2024-09-09 DIAGNOSIS — G4700 Insomnia, unspecified: Secondary | ICD-10-CM | POA: Diagnosis not present

## 2024-09-09 DIAGNOSIS — E440 Moderate protein-calorie malnutrition: Secondary | ICD-10-CM | POA: Diagnosis not present

## 2024-09-09 DIAGNOSIS — Z1152 Encounter for screening for COVID-19: Secondary | ICD-10-CM

## 2024-09-09 DIAGNOSIS — Z7951 Long term (current) use of inhaled steroids: Secondary | ICD-10-CM | POA: Diagnosis not present

## 2024-09-09 DIAGNOSIS — Z87891 Personal history of nicotine dependence: Secondary | ICD-10-CM | POA: Diagnosis not present

## 2024-09-09 DIAGNOSIS — I5030 Unspecified diastolic (congestive) heart failure: Secondary | ICD-10-CM | POA: Diagnosis not present

## 2024-09-09 DIAGNOSIS — J9811 Atelectasis: Secondary | ICD-10-CM | POA: Diagnosis not present

## 2024-09-09 DIAGNOSIS — J45901 Unspecified asthma with (acute) exacerbation: Secondary | ICD-10-CM | POA: Diagnosis not present

## 2024-09-09 DIAGNOSIS — Z6820 Body mass index (BMI) 20.0-20.9, adult: Secondary | ICD-10-CM | POA: Diagnosis not present

## 2024-09-09 DIAGNOSIS — J432 Centrilobular emphysema: Secondary | ICD-10-CM | POA: Diagnosis present

## 2024-09-09 DIAGNOSIS — K219 Gastro-esophageal reflux disease without esophagitis: Secondary | ICD-10-CM | POA: Diagnosis not present

## 2024-09-09 DIAGNOSIS — J310 Chronic rhinitis: Secondary | ICD-10-CM | POA: Diagnosis not present

## 2024-09-09 DIAGNOSIS — Z91148 Patient's other noncompliance with medication regimen for other reason: Secondary | ICD-10-CM

## 2024-09-09 DIAGNOSIS — Z9049 Acquired absence of other specified parts of digestive tract: Secondary | ICD-10-CM

## 2024-09-09 DIAGNOSIS — J9621 Acute and chronic respiratory failure with hypoxia: Principal | ICD-10-CM | POA: Diagnosis present

## 2024-09-09 DIAGNOSIS — Z9981 Dependence on supplemental oxygen: Secondary | ICD-10-CM

## 2024-09-09 DIAGNOSIS — Z7982 Long term (current) use of aspirin: Secondary | ICD-10-CM | POA: Diagnosis not present

## 2024-09-09 DIAGNOSIS — F411 Generalized anxiety disorder: Secondary | ICD-10-CM | POA: Diagnosis not present

## 2024-09-09 DIAGNOSIS — J441 Chronic obstructive pulmonary disease with (acute) exacerbation: Secondary | ICD-10-CM | POA: Diagnosis not present

## 2024-09-09 DIAGNOSIS — F32A Depression, unspecified: Secondary | ICD-10-CM | POA: Diagnosis present

## 2024-09-09 DIAGNOSIS — J9622 Acute and chronic respiratory failure with hypercapnia: Secondary | ICD-10-CM | POA: Diagnosis not present

## 2024-09-09 DIAGNOSIS — F410 Panic disorder [episodic paroxysmal anxiety] without agoraphobia: Secondary | ICD-10-CM | POA: Diagnosis present

## 2024-09-09 DIAGNOSIS — R0602 Shortness of breath: Secondary | ICD-10-CM | POA: Diagnosis not present

## 2024-09-09 LAB — COMPREHENSIVE METABOLIC PANEL WITH GFR
ALT: 19 U/L (ref 0–44)
AST: 23 U/L (ref 15–41)
Albumin: 3.7 g/dL (ref 3.5–5.0)
Alkaline Phosphatase: 36 U/L — ABNORMAL LOW (ref 38–126)
Anion gap: 14 (ref 5–15)
BUN: 15 mg/dL (ref 8–23)
CO2: 27 mmol/L (ref 22–32)
Calcium: 8.7 mg/dL — ABNORMAL LOW (ref 8.9–10.3)
Chloride: 94 mmol/L — ABNORMAL LOW (ref 98–111)
Creatinine, Ser: 0.59 mg/dL (ref 0.44–1.00)
GFR, Estimated: >60 mL/min (ref >60)
Glucose, Bld: 158 mg/dL — ABNORMAL HIGH (ref 70–99)
Potassium: 3.9 mmol/L (ref 3.5–5.1)
Sodium: 135 mmol/L (ref 135–145)
Total Bilirubin: 0.6 mg/dL (ref 0.0–1.2)
Total Protein: 6.7 g/dL (ref 6.5–8.1)

## 2024-09-09 LAB — RESPIRATORY PANEL BY PCR

## 2024-09-09 LAB — CBC
HCT: 41.5 % (ref 36.0–46.0)
Hemoglobin: 13.3 g/dL (ref 12.0–15.0)
MCH: 31.5 pg (ref 26.0–34.0)
MCHC: 32 g/dL (ref 30.0–36.0)
MCV: 98.3 fL (ref 80.0–100.0)
Platelets: 284 K/uL (ref 150–400)
RBC: 4.22 MIL/uL (ref 3.87–5.11)
RDW: 11.9 % (ref 11.5–15.5)
WBC: 10.3 K/uL (ref 4.0–10.5)
nRBC: 0 % (ref 0.0–0.2)

## 2024-09-09 LAB — TROPONIN I (HIGH SENSITIVITY): Troponin I (High Sensitivity): 5 ng/L (ref <18)

## 2024-09-09 LAB — HIV ANTIBODY (ROUTINE TESTING W REFLEX): HIV Screen 4th Generation wRfx: NONREACTIVE

## 2024-09-09 LAB — RESP PANEL BY RT-PCR (RSV, FLU A&B, COVID)  RVPGX2
Influenza A by PCR: NEGATIVE
Influenza B by PCR: NEGATIVE
Resp Syncytial Virus by PCR: NEGATIVE
SARS Coronavirus 2 by RT PCR: NEGATIVE

## 2024-09-09 LAB — BLOOD GAS, VENOUS
Acid-Base Excess: 2.3 mmol/L — ABNORMAL HIGH (ref 0.0–2.0)
Bicarbonate: 31.4 mmol/L — ABNORMAL HIGH (ref 20.0–28.0)
O2 Saturation: 54.7 %
Patient temperature: 37
pCO2, Ven: 70 mmHg — ABNORMAL HIGH (ref 44–60)
pH, Ven: 7.26 (ref 7.25–7.43)
pO2, Ven: 34 mmHg (ref 32–45)

## 2024-09-09 LAB — BRAIN NATRIURETIC PEPTIDE: B Natriuretic Peptide: 10.3 pg/mL (ref 0.0–100.0)

## 2024-09-09 MED ORDER — ORAL CARE MOUTH RINSE: 15.0000 mL | OROMUCOSAL | Status: DC | PRN

## 2024-09-09 MED ORDER — TIZANIDINE HCL 2 MG PO TABS: 2.0000 mg | ORAL_TABLET | Freq: Three times a day (TID) | ORAL | Status: DC | PRN

## 2024-09-09 MED ORDER — ONDANSETRON HCL 4 MG PO TABS: 4.0000 mg | ORAL_TABLET | Freq: Four times a day (QID) | ORAL | Status: DC | PRN

## 2024-09-09 MED ORDER — IPRATROPIUM-ALBUTEROL 0.5-2.5 (3) MG/3ML IN SOLN
3.0000 mL | Freq: Four times a day (QID) | RESPIRATORY_TRACT | Status: DC
  Administered 2024-09-09 – 2024-09-13 (×12): 3 mL via RESPIRATORY_TRACT
  Filled 2024-09-09 (×5): qty 3
  Filled 2024-09-09: qty 30
  Filled 2024-09-09 (×9): qty 3

## 2024-09-09 MED ORDER — ENSURE ENLIVE PO LIQD
237.0000 mL | Freq: Two times a day (BID) | ORAL | Status: DC
  Administered 2024-09-10 – 2024-09-15 (×6): 237 mL via ORAL
  Filled 2024-09-09: qty 237

## 2024-09-09 MED ORDER — ORAL CARE MOUTH RINSE
15.0000 mL | OROMUCOSAL | Status: DC
  Administered 2024-09-09 – 2024-09-15 (×18): 15 mL via OROMUCOSAL

## 2024-09-09 MED ORDER — ENSIFENTRINE 3 MG/2.5ML IN SUSP: 3.0000 mg | Freq: Two times a day (BID) | RESPIRATORY_TRACT | Status: DC

## 2024-09-09 MED ORDER — FAMOTIDINE 20 MG PO TABS
20.0000 mg | ORAL_TABLET | Freq: Two times a day (BID) | ORAL | Status: DC | PRN
Start: 2024-09-09 — End: 2024-09-15

## 2024-09-09 MED ORDER — ALBUTEROL SULFATE (2.5 MG/3ML) 0.083% IN NEBU
3.0000 mL | INHALATION_SOLUTION | RESPIRATORY_TRACT | Status: DC | PRN
  Administered 2024-09-10 – 2024-09-14 (×6): 3 mL via RESPIRATORY_TRACT
  Filled 2024-09-09 (×6): qty 3

## 2024-09-09 MED ORDER — METHYLPREDNISOLONE SODIUM SUCC 40 MG IJ SOLR
40.0000 mg | Freq: Two times a day (BID) | INTRAMUSCULAR | Status: AC
  Administered 2024-09-09 – 2024-09-10 (×2): 40 mg via INTRAVENOUS
  Filled 2024-09-09 (×2): qty 1

## 2024-09-09 MED ORDER — PANTOPRAZOLE SODIUM 40 MG PO TBEC
40.0000 mg | DELAYED_RELEASE_TABLET | Freq: Every day | ORAL | Status: DC
  Administered 2024-09-09 – 2024-09-15 (×7): 40 mg via ORAL
  Filled 2024-09-09 (×7): qty 1

## 2024-09-09 MED ORDER — EZETIMIBE 10 MG PO TABS
10.0000 mg | ORAL_TABLET | Freq: Every day | ORAL | Status: DC
  Administered 2024-09-09 – 2024-09-15 (×7): 10 mg via ORAL
  Filled 2024-09-09 (×7): qty 1

## 2024-09-09 MED ORDER — BUDESON-GLYCOPYRROL-FORMOTEROL 160-9-4.8 MCG/ACT IN AERO
2.0000 | INHALATION_SPRAY | Freq: Two times a day (BID) | RESPIRATORY_TRACT | Status: DC
  Administered 2024-09-09 – 2024-09-15 (×13): 2 via RESPIRATORY_TRACT
  Filled 2024-09-09: qty 5.9

## 2024-09-09 MED ORDER — ONDANSETRON HCL 4 MG/2ML IJ SOLN: 4.0000 mg | Freq: Four times a day (QID) | INTRAMUSCULAR | Status: DC | PRN

## 2024-09-09 MED ORDER — QUETIAPINE FUMARATE 25 MG PO TABS
25.0000 mg | ORAL_TABLET | Freq: Every day | ORAL | Status: DC
  Administered 2024-09-09 – 2024-09-14 (×6): 25 mg via ORAL
  Filled 2024-09-09 (×6): qty 1

## 2024-09-09 MED ORDER — IPRATROPIUM-ALBUTEROL 0.5-2.5 (3) MG/3ML IN SOLN
3.0000 mL | Freq: Once | RESPIRATORY_TRACT | Status: AC
  Administered 2024-09-09: 3 mL via RESPIRATORY_TRACT
  Filled 2024-09-09: qty 3

## 2024-09-09 MED ORDER — PREDNISONE 20 MG PO TABS: 40.0000 mg | ORAL_TABLET | Freq: Every day | ORAL | Status: DC

## 2024-09-09 MED ORDER — OLANZAPINE 10 MG IM SOLR
2.5000 mg | Freq: Once | INTRAMUSCULAR | Status: AC | PRN
  Administered 2024-09-09: 2.5 mg via INTRAMUSCULAR
  Filled 2024-09-09: qty 10

## 2024-09-09 MED ORDER — ENOXAPARIN SODIUM 40 MG/0.4ML IJ SOSY
40.0000 mg | PREFILLED_SYRINGE | INTRAMUSCULAR | Status: DC
  Administered 2024-09-09 – 2024-09-14 (×6): 40 mg via SUBCUTANEOUS
  Filled 2024-09-09 (×6): qty 0.4

## 2024-09-09 MED ORDER — BUSPIRONE HCL 15 MG PO TABS
7.5000 mg | ORAL_TABLET | Freq: Two times a day (BID) | ORAL | Status: DC | PRN
  Administered 2024-09-09 – 2024-09-15 (×8): 7.5 mg via ORAL
  Filled 2024-09-09 (×9): qty 1

## 2024-09-09 MED ORDER — DOXYCYCLINE HYCLATE 100 MG PO TABS
100.0000 mg | ORAL_TABLET | Freq: Two times a day (BID) | ORAL | Status: AC
  Administered 2024-09-09 – 2024-09-13 (×10): 100 mg via ORAL
  Filled 2024-09-09 (×10): qty 1

## 2024-09-09 MED ORDER — MONTELUKAST SODIUM 10 MG PO TABS
10.0000 mg | ORAL_TABLET | Freq: Every day | ORAL | Status: DC
  Administered 2024-09-09 – 2024-09-14 (×6): 10 mg via ORAL
  Filled 2024-09-09 (×6): qty 1

## 2024-09-09 MED ORDER — ASPIRIN 81 MG PO TBEC
81.0000 mg | DELAYED_RELEASE_TABLET | Freq: Every day | ORAL | Status: DC
  Administered 2024-09-09 – 2024-09-15 (×7): 81 mg via ORAL
  Filled 2024-09-09 (×7): qty 1

## 2024-09-09 MED ORDER — BENZONATATE 100 MG PO CAPS: 100.0000 mg | ORAL_CAPSULE | Freq: Three times a day (TID) | ORAL | Status: DC | PRN

## 2024-09-09 MED ORDER — CYANOCOBALAMIN 1000 MCG/ML IJ SOLN
1000.0000 ug | Freq: Once | INTRAMUSCULAR | Status: AC
  Administered 2024-09-13: 1000 ug via INTRAMUSCULAR
  Filled 2024-09-09 (×2): qty 1

## 2024-09-09 NOTE — Progress Notes (Signed)
 Patient transported from ED to room 250 with no complications.  O2 sat and vitals remained stable throughout.

## 2024-09-09 NOTE — Progress Notes (Signed)
   09/09/24 1215  Assess: MEWS Score  Temp (!) 97.4 F (36.3 C)  BP 138/81  MAP (mmHg) 97  Pulse Rate (!) 122  ECG Heart Rate (!) 122  Resp (!) 24  SpO2 97 %  O2 Device Nasal Cannula  O2 Flow Rate (L/min) 4 L/min  Assess: MEWS Score  MEWS Temp 0  MEWS Systolic 0  MEWS Pulse 2  MEWS RR 1  MEWS LOC 0  MEWS Score 3  MEWS Score Color Yellow  Assess: if the MEWS score is Yellow or Red  Were vital signs accurate and taken at a resting state? Yes  Does the patient meet 2 or more of the SIRS criteria? Yes  Does the patient have a confirmed or suspected source of infection? Yes  MEWS guidelines implemented  Yes, yellow  Treat  MEWS Interventions Considered administering scheduled or prn medications/treatments as ordered  Take Vital Signs  Increase Vital Sign Frequency  Yellow: Q2hr x1, continue Q4hrs until patient remains green for 12hrs  Escalate  MEWS: Escalate Yellow: Discuss with charge nurse and consider notifying provider and/or RRT  Notify: Charge Nurse/RN  Name of Charge Nurse/RN Notified nathan, RN  Provider Notification  Provider Name/Title Dr. Cort Mana  Date Provider Notified 09/09/24  Time Provider Notified 1240  Method of Notification  (secure chat)  Notification Reason Other (Comment) (yellow MEWS, anxiety and SOB)  Provider response Other (Comment) (secure chat)  Date of Provider Response 09/09/24  Time of Provider Response 1248  Notify: Rapid Response  Name of Rapid Response RN Notified Izetta Ina, RN  Date Rapid Response Notified 09/09/24  Time Rapid Response Notified 1254  Assess: SIRS CRITERIA  SIRS Temperature  0  SIRS Respirations  1  SIRS Pulse 1  SIRS WBC 0  SIRS Score Sum  2   Dr. Mana aware that apteitn is yellow MEWS, short of breath and anxious. MD placed order for zyprexa and called and spoke with patient's daughter. Patient on BiPap at this time.

## 2024-09-09 NOTE — ED Notes (Signed)
RT in room.

## 2024-09-09 NOTE — ED Provider Notes (Signed)
 [facility] Provider Note    Event Date/Time   First MD Initiated Contact with Patient 09/09/24 [phone]     (approximate)   History   Shortness of Breath   HPI  Brandi [name] is a 68 y.o. female with a history of COPD on 4 L nasal cannula at home presents with severe shortness of breath.  EMS found patient to be satting approximately 67% on 4 L, they switched her to CPAP, they did give Solu-Medrol , magnesium, nebulizers     Physical Exam   Triage Vital Signs: ED Triage Vitals  Encounter Vitals Group     BP 09/09/24 0819 (!) 157/80     Girls Systolic BP Percentile --      Girls Diastolic BP Percentile --      Boys Systolic BP Percentile --      Boys Diastolic BP Percentile --      Pulse Rate 09/09/24 0819 (!) 106     Resp 09/09/24 0819 (!) 25     Temp --      Temp src --      SpO2 09/09/24 0819 100 %     Weight 09/09/24 0817 54.6 kg (120 lb 5.9 oz)     Height 09/09/24 0817 1.626 m (5' 4)     Head Circumference --      Peak Flow --      Pain Score 09/09/24 0817 0     Pain Loc --      Pain Education --      Exclude from Growth Chart --     Most recent vital signs: Vitals:   09/09/24 0842 09/09/24 0855  BP:    Pulse:  99  Resp:  17  Temp: (!) 96.8 F (36 C)   SpO2:  100%     General: Awake, CPAP in place CV:  Good peripheral perfusion.  Mild tachycardia Resp:  Increased respiratory effort with tachypnea, scattered mild wheezes, poor airflow Abd:  No distention.  Other:  No calf pain or swelling   ED Results / Procedures / Treatments   Labs (all labs ordered are listed, but only abnormal results are displayed) Labs Reviewed  COMPREHENSIVE METABOLIC PANEL WITH GFR - Abnormal; Notable for the following components:      Result Value   Chloride 94 (*)    Glucose, Bld 158 (*)    Calcium  8.7 (*)    Alkaline Phosphatase 36 (*)    All other components within normal limits  BLOOD GAS, VENOUS - Abnormal; Notable for the following  components:   pCO2, Ven 70 (*)    Bicarbonate 31.4 (*)    Acid-Base Excess 2.3 (*)    All other components within normal limits  RESP PANEL BY RT-PCR (RSV, FLU A&B, COVID)  [id number]  CBC  BRAIN NATRIURETIC PEPTIDE  TROPONIN I (HIGH SENSITIVITY)     EKG  ED ECG REPORT I, Lamar Price, the attending physician, personally viewed and interpreted this ECG.  Date: 09/09/2024  Rhythm: normal sinus rhythm QRS Axis: normal Intervals: normal ST/T Wave abnormalities: Nonspecific changes Narrative Interpretation: no evidence of acute ischemia    RADIOLOGY Chest x-ray without evidence of pneumonia or pneumothorax    PROCEDURES:  Critical Care performed: yes  CRITICAL CARE Performed by: Lamar Price   Total critical care time: 30 minutes  Critical care time was exclusive of separately billable procedures and treating other patients.  Critical care was necessary to treat or prevent imminent or life-threatening deterioration.  Critical care was time spent personally by me on the following activities: development of treatment plan with patient and/or surrogate as well as nursing, discussions with consultants, evaluation of patient's response to treatment, examination of patient, obtaining history from patient or surrogate, ordering and performing treatments and interventions, ordering and review of laboratory studies, ordering and review of radiographic studies, pulse oximetry and re-evaluation of patient's condition.   Procedures   MEDICATIONS ORDERED IN ED: Medications  ipratropium-albuterol  (DUONEB) 0.5-2.5 (3) MG/3ML nebulizer solution 3 mL (3 mLs Nebulization Given 09/09/24 9167)     IMPRESSION / MDM / ASSESSMENT AND PLAN / ED COURSE  I reviewed the triage vital signs and the nursing notes. Patient's presentation is most consistent with acute presentation with potential threat to life or bodily function.  Patient presents with severe shortness of breath, severe  hypoxia on nasal cannula oxygen  converted to BiPAP here, satting 100%.  Labs chest x-ray COVID pending.  Additional DuoNebs have been ordered.  Chest x-ray viewed interpreted by me, no pneumothorax, pending radiology review  Lab work reviewed and is overall reassuring  VBG demonstrates CO2 to of 70, patient much improved on BiPAP, will leave her on BiPAP for a while longer, will consult the hospitalist team for admission      FINAL CLINICAL IMPRESSION(S) / ED DIAGNOSES   Final diagnoses:  COPD exacerbation (HCC)  Acute on chronic respiratory failure with hypoxia and hypercapnia (HCC)     Rx / DC Orders   ED Discharge Orders     None        Note:  This document was prepared using Dragon voice recognition software and may include unintentional dictation errors.   Arlander Charleston, MD 09/09/24 [phone]

## 2024-09-09 NOTE — H&P (Signed)
 History and Physical    Brandi [name] FMW:[id number] DOB: 1956-05-02 DOA: 09/09/2024  PCP: Edman [name], DO (Confirm with patient/family/NH records and if not entered, this has to be entered at [facility] point of entry) Patient coming from: Home  I have personally briefly reviewed patient's old medical records in [facility]  Chief Complaint: Cough, wheezing, shortness of breath  HPI: Brandi [name] is a 68 y.o. 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  Symptoms started about 2 weeks ago, patient started to have dry cough and wheezing and increasing shortness of breath.  No chest pain no fever or chills.  Went to see PCP about 1 week ago, was diagnosed with bronchitis and started Bactrim  and despite, patient continued to have worsening of cough wheezing and shortness of breath.  Called EMS this morning, and EMS arrived at her home and found patient O2 sat in the 60% and cyanotic, and patient was placed on CPAP.  Patient was also given albuterol  2 mg magnesium and 125 mg of Solu-Medrol  in the field.  ED Course: Afebrile, borderline tachycardia blood pressure 108/77, saturation 100% on BiPAP with 30% of FiO2.  Chest x-ray negative for acute infiltrates, VBG showed 7.20 6/70/34, WBC 10.3 hemoglobin 13.3 bicarb 27 BUN 15 creatinine 0.5.  Patient was given Solu-Medrol  but remained wheezy  Review of Systems: As per HPI otherwise 14 point review of systems negative.    Past Medical History:  Diagnosis Date   Anxiety    COPD (chronic obstructive pulmonary disease) (HCC)    Hyperlipidemia     Past Surgical History:  Procedure Laterality Date   APPENDECTOMY     CHOLECYSTECTOMY     SHOULDER ARTHROSCOPY Left 07/02/2015   Procedure: ,left shoulder arthroscopy, decompression and debridement;  Surgeon:  Norleen PARAS Maltos, MD;  Location: [facility] ORS;  Service: Orthopedics;  Laterality: Left;   SHOULDER CLOSED REDUCTION Left 04/09/2015   Procedure: CLOSED MANIPULATION SHOULDER;  Surgeon: Ozell Flake, MD;  Location: [facility] ORS;  Service: Orthopedics;  Laterality: Left;   TUBAL LIGATION       reports that she quit smoking about 8 years ago. Her smoking use included cigarettes. She started smoking about 49 years ago. She has a 30.8 pack-year smoking history. She has never used smokeless tobacco. She reports that she does not drink alcohol and does not use drugs.  No Known Allergies  Family History  Problem Relation Age of Onset   Breast cancer Mother 25   Alzheimer's disease Mother     Prior to Admission medications   Medication Sig Start Date End Date Taking? Authorizing Provider  albuterol  (PROVENTIL ) (2.5 MG/3ML) 0.083% nebulizer solution USE 1 VIAL VIA NEBULIZER EVERY 6 HOURS AS NEEDED FOR WHEEZING OR SHORTNESS OF BREATH 09/05/24   Tamea Dedra CROME, MD  albuterol  (VENTOLIN  HFA) 108 [phone] Base) MCG/ACT inhaler INHALE 2 PUFFS INTO THE LUNGS EVERY 4 HOURS AS NEEDED FOR WHEEZING OR SHORTNESS OF BREATH 08/03/24   [name], [name], DO  alendronate  (FOSAMAX ) 70 MG tablet Take 1 tablet (70 mg total) by mouth once a week. 01/14/24   [name], [name], DO  aspirin  EC 81 MG tablet Take 1 tablet (81 mg total) by mouth daily. 05/22/19   Gollan, [name], MD  budesonide-glycopyrrolate -formoterol (BREZTRI  AEROSPHERE) 160-9-4.8 MCG/ACT AERO inhaler Inhale 2 puffs into the lungs in the morning and at  bedtime. 08/02/24   Tamea Dedra CROME, MD  busPIRone  (BUSPAR ) 7.5 MG tablet Take 1 tablet (7.5 mg total) by mouth 2 (two) times daily as needed. for anxiety 09/01/24   Edman [name], DO  Calcium  Carbonate-Vit D-Min (CALCIUM  1200) 1200-1000 MG-UNIT CHEW Chew 1,200 mg by mouth daily. 01/31/19   [name], [name], MD  Cholecalciferol (VITAMIN D3) 125 MCG (5000 UT) TABS Take by mouth. Vitamin D3 5,000  IU daily for 12 weeks then reduce to OTC Vitamin D3 2,000 IU daily for maintenance    [provider]  cyanocobalamin  (VITAMIN B12) 1000 MCG/ML injection Inject 1 mL (1,000 mcg total) into the muscle once a week. For 4 weeks 09/04/24   Edman [name], DO  ezetimibe  (ZETIA ) 10 MG tablet Take 1 tablet (10 mg total) by mouth daily. 09/14/23   Gollan, [name], MD  famotidine  (PEPCID ) 20 MG tablet TAKE 1 TABLET BY MOUTH TWICE DAILY AS NEEDED 08/03/24   Edman, [name], DO  feeding supplement (ENSURE ENLIVE / ENSURE PLUS) LIQD Take 237 mLs by mouth 2 (two) times daily between meals. 12/23/20   [name], DO  montelukast  (SINGULAIR ) 10 MG tablet TAKE 1 TABLET BY MOUTH AT BEDTIME 11/03/23   [name], [name], DO  Multiple Vitamin (MULTIVITAMIN WITH MINERALS) TABS tablet Take 1 tablet by mouth daily. 12/24/20   Fausto Sor LABOR, DO  naproxen  (NAPROSYN ) 500 MG tablet Take 1 tablet (500 mg total) by mouth 2 (two) times daily with a meal. For 1-2 weeks then as needed 02/02/24   Edman [name], DO  OHTUVAYRE  3 MG/2.5ML SUSP Inhale 3 mg into the lungs 2 (two) times daily. 04/28/24   Tamea Dedra CROME, MD  omeprazole  (PRILOSEC) 20 MG capsule Take 1 capsule (20 mg total) by mouth 2 (two) times daily before a meal. 04/24/24   [name], [name], DO  OXYGEN  Inhale 3 L into the lungs at bedtime as needed.    [provider]  predniSONE  (DELTASONE ) 5 MG tablet Take 5 mg by mouth daily. 08/03/24 11/01/24  [provider]  rosuvastatin  (CRESTOR ) 10 MG tablet Take 1 tablet (10 mg total) by mouth daily. 09/14/23   Gollan, [name], MD  sodium chloride  (OCEAN) 0.65 % SOLN nasal spray Place 1 spray into both nostrils as needed for congestion.    [provider]  Specialty Vitamins Products (HAIR NOURISHING SUPPLEMENT PO) Take 1 tablet by mouth daily.    [provider]  sulfamethoxazole -trimethoprim  (BACTRIM ) 400-80 MG tablet Take 1  tablet by mouth 3 (three) times a week. 09/01/24 12/01/24  [provider]  tiZANidine  (ZANAFLEX ) 2 MG tablet Take 1 tablet (2 mg total) by mouth every 8 (eight) hours as needed for muscle spasms. 03/26/23   [name], [name], DO  triamcinolone  cream (KENALOG ) 0.5 % Apply 1 Application topically 2 (two) times daily as needed (allergic reaction on skin swelling). To affected areas, for up to 2 weeks. 04/20/23   Edman [name], DO    Physical Exam: Vitals:   09/09/24 0819 09/09/24 0840 09/09/24 0842 09/09/24 0855  BP: (!) 157/80 108/77    Pulse: (!) 106 (!) 101  99  Resp: (!) 25 20  17   Temp:   (!) 96.8 F (36 C)   TempSrc:   Axillary   SpO2: 100% 100%  100%  Weight:      Height:        Constitutional: NAD, calm, comfortable Vitals:   09/09/24 0819 09/09/24 0840  09/09/24 0842 09/09/24 0855  BP: (!) 157/80 108/77    Pulse: (!) 106 (!) 101  99  Resp: (!) 25 20  17   Temp:   (!) 96.8 F (36 C)   TempSrc:   Axillary   SpO2: 100% 100%  100%  Weight:      Height:       Eyes: PERRL, lids and conjunctivae normal ENMT: Mucous membranes are moist. Posterior pharynx clear of any exudate or lesions.Normal dentition.  Neck: normal, supple, no masses, no thyromegaly Respiratory: Diminished breath sound bilaterally, scattered wheezing, no crackles, increasing respiratory effort. No accessory muscle use.  Cardiovascular: Regular rate and rhythm, no murmurs / rubs / gallops. No extremity edema. 2+ pedal pulses. No carotid bruits.  Abdomen: no tenderness, no masses palpated. No hepatosplenomegaly. Bowel sounds positive.  Musculoskeletal: no clubbing / cyanosis. No joint deformity upper and lower extremities. Good ROM, no contractures. Normal muscle tone.  Skin: no rashes, lesions, ulcers. No induration Neurologic: CN 2-12 grossly intact. Sensation intact, DTR normal. Strength 5/5 in all 4.  Psychiatric: Normal judgment and insight. Alert and oriented x 3. Normal mood.      Labs on Admission: I have personally reviewed following labs and imaging studies  CBC: Recent Labs  Lab 09/09/24 0825  WBC 10.3  HGB 13.3  HCT 41.5  MCV 98.3  PLT 284   Basic Metabolic Panel: Recent Labs  Lab 09/09/24 0825  NA 135  K 3.9  CL 94*  CO2 27  GLUCOSE 158*  BUN 15  CREATININE 0.59  CALCIUM  8.7*   GFR: Estimated Creatinine Clearance: 58 mL/min (by C-G formula based on SCr of 0.59 mg/dL). Liver Function Tests: Recent Labs  Lab 09/09/24 0825  AST 23  ALT 19  ALKPHOS 36*  BILITOT 0.6  PROT 6.7  ALBUMIN 3.7   No results for input(s): LIPASE, AMYLASE in the last 168 hours. No results for input(s): AMMONIA in the last 168 hours. Coagulation Profile: No results for input(s): INR, PROTIME in the last 168 hours. Cardiac Enzymes: No results for input(s): CKTOTAL, CKMB, CKMBINDEX, TROPONINI in the last 168 hours. BNP (last 3 results) No results for input(s): PROBNP in the last 8760 hours. HbA1C: No results for input(s): HGBA1C in the last 72 hours. CBG: No results for input(s): GLUCAP in the last 168 hours. Lipid Profile: No results for input(s): CHOL, HDL, LDLCALC, TRIG, CHOLHDL, LDLDIRECT in the last 72 hours. Thyroid  Function Tests: No results for input(s): TSH, T4TOTAL, FREET4, T3FREE, THYROIDAB in the last 72 hours. Anemia Panel: No results for input(s): VITAMINB12, FOLATE, FERRITIN, TIBC, IRON, RETICCTPCT in the last 72 hours. Urine analysis:    Component Value Date/Time   COLORURINE YELLOW 10/30/2022 1050   APPEARANCEUR HAZY (A) 10/30/2022 1050   LABSPEC 1.015 10/30/2022 1050   PHURINE 7.5 10/30/2022 1050   GLUCOSEU NEGATIVE 10/30/2022 1050   HGBUR NEGATIVE 10/30/2022 1050   BILIRUBINUR NEGATIVE 10/30/2022 1050   KETONESUR NEGATIVE 10/30/2022 1050   PROTEINUR NEGATIVE 10/30/2022 1050   NITRITE NEGATIVE 10/30/2022 1050   LEUKOCYTESUR SMALL (A) 10/30/2022 1050    Radiological  Exams on Admission: DG Chest Port 1 View Result Date: 09/09/2024 EXAM: 1 VIEW(S) XRAY OF THE CHEST 09/09/2024 08:31:00 AM COMPARISON: 07/18/2024 CLINICAL HISTORY: sob, copd. Table formatting from the original note was not included.; patient coming in from home SOB for 2 weeks. Patient coming in on c-pap via EMS. ; hx of asthma, copd,. inital o2 67% on 4L [place] that she wears at home.  patient was blue and mottled with ac3essory muscle use. 5 mg allbuterol, 2mg  mag, 125mg  solumed. Lung sounds were absent for EMS FINDINGS: LUNGS AND PLEURA: Hyperinflation with emphysema. No focal pulmonary opacity. No pulmonary edema. No pleural effusion. No pneumothorax. HEART AND MEDIASTINUM: Aortic atherosclerosis. No acute abnormality of the cardiac and mediastinal silhouettes. BONES AND SOFT TISSUES: No acute osseous abnormality. IMPRESSION: 1. Hyperinflation with emphysema, consistent with COPD. 2. Aortic atherosclerosis. Electronically signed by: Evalene Coho MD 09/09/2024 08:38 AM EDT RP Workstation: [id number]    EKG: Independently reviewed.  Sinus rhythm, no acute ST changes.  Assessment/Plan Principal Problem:   COPD exacerbation (HCC) Active Problems:   COPD with acute exacerbation (HCC)  (please populate well all problems here in Problem List. (For example, if patient is on BP meds at home and you resume or decide to hold them, it is a problem that needs to be her. Same for CAD, COPD, HLD and so on)  Acute on chronic hypoxic respiratory failure Acute COPD exacerbation Chronic hypercapnic respiratory failure -Admit to PCU for BiPAP titrating.  Continue IV Solu-Medrol  -Change antibiotic coverage from Bactrim  to doxycycline , check sputum culture, atypical infection study.  Will also check respiratory viral panel -DuoNebs and as needed albuterol  -Continue ICS and LABA -Incentive spirometry -Outpatient pulmonary follow-up for home CPAP evaluation  Anxiety/depression - Mentation at baseline, continue  BuSpar   GERD - Reported to be fairly controlled - Continue PPI and as needed Pepcid   Moderate protein calorie malnutrition - BMI= 20 - On protein supplement  DVT prophylaxis: Lovenox  Code Status: Full code Family Communication: None at bedside Disposition Plan: Patient sick with severe COPD exacerbation requiring CPAP and IV steroid, expect more than 2 midnight hospital stay Consults called: None Admission status: PCU admit   Cort ONEIDA Mana MD [facility] Pager [phone]  09/09/2024, 9:51 AM

## 2024-09-09 NOTE — Progress Notes (Signed)
 D/W daughter over phone regarding patient's care, all questions answered with my best knowledge.

## 2024-09-09 NOTE — ED Triage Notes (Addendum)
 patient coming in from home SOB for 2 weeks. PAtient coming in on c-pap via EMS.   hx of asthma, copd, and emphysema. Initial O2 was 67% on 4L  that she wears at home. Patient was blue and mottled with accessory muscle use when EMS found patient. 5 mg allbuterol, 2mg  mag, 125mg  solumed given in the field by EMS. Lung sounds were absent for EMS. EMS VS: 109, 98.9, CBG: 126. RR: 25.   Patient alert and oriented at this time. Bi-pap placed by RT.

## 2024-09-09 NOTE — Plan of Care (Signed)
  Problem: Clinical Measurements: Goal: Respiratory complications will improve Outcome: Progressing Goal: Cardiovascular complication will be avoided Outcome: Progressing   Problem: Nutrition: Goal: Adequate nutrition will be maintained Outcome: Progressing   Problem: Coping: Goal: Level of anxiety will decrease Outcome: Progressing   Problem: Safety: Goal: Ability to remain free from injury will improve Outcome: Progressing   Problem: Skin Integrity: Goal: Risk for impaired skin integrity will decrease Outcome: Progressing   Problem: Respiratory: Goal: Ability to maintain a clear airway will improve Outcome: Progressing Goal: Levels of oxygenation will improve Outcome: Progressing Goal: Ability to maintain adequate ventilation will improve Outcome: Progressing

## 2024-09-09 NOTE — ED Notes (Signed)
 Warm blankets applied at this time due to low temp of 96.8.

## 2024-09-10 DIAGNOSIS — J441 Chronic obstructive pulmonary disease with (acute) exacerbation: Secondary | ICD-10-CM | POA: Diagnosis not present

## 2024-09-10 LAB — CBC
HCT: 35.4 % — ABNORMAL LOW (ref 36.0–46.0)
Hemoglobin: 11.7 g/dL — ABNORMAL LOW (ref 12.0–15.0)
MCH: 31.7 pg (ref 26.0–34.0)
MCHC: 33.1 g/dL (ref 30.0–36.0)
MCV: 95.9 fL (ref 80.0–100.0)
Platelets: 253 K/uL (ref 150–400)
RBC: 3.69 MIL/uL — ABNORMAL LOW (ref 3.87–5.11)
RDW: 11.8 % (ref 11.5–15.5)
WBC: 8.3 K/uL (ref 4.0–10.5)
nRBC: 0 % (ref 0.0–0.2)

## 2024-09-10 LAB — EXPECTORATED SPUTUM ASSESSMENT W GRAM STAIN, RFLX TO RESP C

## 2024-09-10 LAB — BLOOD GAS, VENOUS
Acid-Base Excess: 9.7 mmol/L — ABNORMAL HIGH (ref 0.0–2.0)
Bicarbonate: 36.3 mmol/L — ABNORMAL HIGH (ref 20.0–28.0)
O2 Saturation: 86.4 %
Patient temperature: 37
pCO2, Ven: 56 mmHg (ref 44–60)
pH, Ven: 7.42 (ref 7.25–7.43)
pO2, Ven: 51 mmHg — ABNORMAL HIGH (ref 32–45)

## 2024-09-10 MED ORDER — METHYLPREDNISOLONE SODIUM SUCC 40 MG IJ SOLR
40.0000 mg | Freq: Two times a day (BID) | INTRAMUSCULAR | Status: DC
  Administered 2024-09-10 – 2024-09-12 (×4): 40 mg via INTRAVENOUS
  Filled 2024-09-10 (×4): qty 1

## 2024-09-10 MED ORDER — METOPROLOL TARTRATE 5 MG/5ML IV SOLN: 5.0000 mg | Freq: Four times a day (QID) | INTRAVENOUS | Status: DC | PRN

## 2024-09-10 MED ORDER — IPRATROPIUM-ALBUTEROL 0.5-2.5 (3) MG/3ML IN SOLN
3.0000 mL | Freq: Once | RESPIRATORY_TRACT | Status: AC
  Administered 2024-09-10: 3 mL via RESPIRATORY_TRACT
  Filled 2024-09-10: qty 3

## 2024-09-10 NOTE — Progress Notes (Signed)
 PROGRESS NOTE    Brandi [name]  FMW:[id number] DOB: February 25, 1956 DOA: 09/09/2024 PCP: Edman [name], DO   Assessment & Plan:   Principal Problem:   COPD exacerbation (HCC) Active Problems:   COPD with acute exacerbation (HCC)  Assessment and Plan: Acute on chronic hypoxic respiratory failure: continue on supplemental oxygen , back on baseline oxygen  of 4L [place]   Acute COPD exacerbation: continue on IV steroids, doxy, bronchodilators & encourage incentive spirometry. Resp viral panel, influenza, COVID19 & RSV are all neg. Pt wore BiPAP for short period of time and then refused it    Depression: severity unknown. Continue on home dose of buspar     GERD: continue on PPI.   Moderate protein calorie malnutrition: continue on protein-calorie malnutrition       DVT prophylaxis: lovenox   Code Status: full  Family Communication: Disposition Plan: likely d/c back home  Level of care: Progressive  Status is: Inpatient Remains inpatient appropriate because: severity of illness    Consultants:    Procedures:   Antimicrobials: doxy  Subjective: Pt c/o shortness of breath   Objective: Vitals:   09/10/24 0141 09/10/24 0348 09/10/24 0710 09/10/24 0807  BP:  115/63  107/66  Pulse:  88  100  Resp:  20    Temp:  (!) 97.5 F (36.4 C)  (!) 97.5 F (36.4 C)  TempSrc:    Oral  SpO2: 96% 98% 95% 100%  Weight:      Height:        Intake/Output Summary (Last 24 hours) at 09/10/2024 0918 Last data filed at 09/10/2024 0655 Gross per 24 hour  Intake 150 ml  Output 550 ml  Net -400 ml   Filed Weights   09/09/24 0817  Weight: 54.6 kg    Examination:  General exam: Appears calm and comfortable. Frail appearing   Respiratory system: course breath sounds b/l. Wheezing b/l  Cardiovascular system: S1 & S2 +. No rubs, gallops or clicks.  Gastrointestinal system: Abdomen is nondistended, soft and nontender. Normal bowel sounds heard. Central nervous system: Alert and  oriented. Moves all extremities  Psychiatry: Judgement and insight appears at baseline. Flat mood and affect    Data Reviewed: I have personally reviewed following labs and imaging studies  CBC: Recent Labs  Lab 09/09/24 0825 09/10/24 0416  WBC 10.3 8.3  HGB 13.3 11.7*  HCT 41.5 35.4*  MCV 98.3 95.9  PLT 284 253   Basic Metabolic Panel: Recent Labs  Lab 09/09/24 0825  NA 135  K 3.9  CL 94*  CO2 27  GLUCOSE 158*  BUN 15  CREATININE 0.59  CALCIUM  8.7*   GFR: Estimated Creatinine Clearance: 58 mL/min (by C-G formula based on SCr of 0.59 mg/dL). Liver Function Tests: Recent Labs  Lab 09/09/24 0825  AST 23  ALT 19  ALKPHOS 36*  BILITOT 0.6  PROT 6.7  ALBUMIN 3.7   No results for input(s): LIPASE, AMYLASE in the last 168 hours. No results for input(s): AMMONIA in the last 168 hours. Coagulation Profile: No results for input(s): INR, PROTIME in the last 168 hours. Cardiac Enzymes: No results for input(s): CKTOTAL, CKMB, CKMBINDEX, TROPONINI in the last 168 hours. BNP (last 3 results) No results for input(s): PROBNP in the last 8760 hours. HbA1C: No results for input(s): HGBA1C in the last 72 hours. CBG: No results for input(s): GLUCAP in the last 168 hours. Lipid Profile: No results for input(s): CHOL, HDL, LDLCALC, TRIG, CHOLHDL, LDLDIRECT in the last 72 hours. Thyroid  Function Tests:  No results for input(s): TSH, T4TOTAL, FREET4, T3FREE, THYROIDAB in the last 72 hours. Anemia Panel: No results for input(s): VITAMINB12, FOLATE, FERRITIN, TIBC, IRON, RETICCTPCT in the last 72 hours. Sepsis Labs: No results for input(s): PROCALCITON, LATICACIDVEN in the last 168 hours.  Recent Results (from the past 240 hours)  Resp panel by RT-PCR (RSV, Flu A&B, Covid) Anterior Nasal Swab     Status: None   Collection Time: 09/09/24  8:25 AM   Specimen: Anterior Nasal Swab  Result Value Ref Range Status   SARS  Coronavirus 2 by RT PCR NEGATIVE NEGATIVE Final    Comment: (NOTE) SARS-CoV-2 target nucleic acids are NOT DETECTED.  The SARS-CoV-2 RNA is generally detectable in upper respiratory specimens during the acute phase of infection. The lowest concentration of SARS-CoV-2 viral copies this assay can detect is 138 copies/mL. A negative result does not preclude SARS-Cov-2 infection and should not be used as the sole basis for treatment or other patient management decisions. A negative result may occur with  improper specimen collection/handling, submission of specimen other than nasopharyngeal swab, presence of viral mutation(s) within the areas targeted by this assay, and inadequate number of viral copies(<138 copies/mL). A negative result must be combined with clinical observations, patient history, and epidemiological information. The expected result is Negative.  Fact Sheet for Patients:  BloggerCourse.com  Fact Sheet for Healthcare Providers:  SeriousBroker.it  This test is no t yet approved or cleared by the [place]  FDA and  has been authorized for detection and/or diagnosis of SARS-CoV-2 by FDA under an Emergency Use Authorization (EUA). This EUA will remain  in effect (meaning this test can be used) for the duration of the COVID-19 declaration under Section 564(b)(1) of the Act, 21 U.S.C.section 360bbb-3(b)(1), unless the authorization is terminated  or revoked sooner.       Influenza A by PCR NEGATIVE NEGATIVE Final   Influenza B by PCR NEGATIVE NEGATIVE Final    Comment: (NOTE) The Xpert Xpress SARS-CoV-2/FLU/RSV plus assay is intended as an aid in the diagnosis of influenza from Nasopharyngeal swab specimens and should not be used as a sole basis for treatment. Nasal washings and aspirates are unacceptable for Xpert Xpress SARS-CoV-2/FLU/RSV testing.  Fact Sheet for  Patients: BloggerCourse.com  Fact Sheet for Healthcare Providers: SeriousBroker.it  This test is not yet approved or cleared by the [place]  FDA and has been authorized for detection and/or diagnosis of SARS-CoV-2 by FDA under an Emergency Use Authorization (EUA). This EUA will remain in effect (meaning this test can be used) for the duration of the COVID-19 declaration under Section 564(b)(1) of the Act, 21 U.S.C. section 360bbb-3(b)(1), unless the authorization is terminated or revoked.     Resp Syncytial Virus by PCR NEGATIVE NEGATIVE Final    Comment: (NOTE) Fact Sheet for Patients: BloggerCourse.com  Fact Sheet for Healthcare Providers: SeriousBroker.it  This test is not yet approved or cleared by the [place]  FDA and has been authorized for detection and/or diagnosis of SARS-CoV-2 by FDA under an Emergency Use Authorization (EUA). This EUA will remain in effect (meaning this test can be used) for the duration of the COVID-19 declaration under Section 564(b)(1) of the Act, 21 U.S.C. section 360bbb-3(b)(1), unless the authorization is terminated or revoked.  Performed at [facility], [address] [place], [place] [address]   Respiratory (~20 pathogens) panel by PCR     Status: None   Collection Time: 09/09/24 10:39 AM   Specimen: Nasopharyngeal Swab; Respiratory  Result  Value Ref Range Status   Adenovirus NOT DETECTED NOT DETECTED Final   Coronavirus 229E NOT DETECTED NOT DETECTED Final    Comment: (NOTE) The Coronavirus on the Respiratory Panel, DOES NOT test for the novel  Coronavirus (2019 nCoV)    Coronavirus HKU1 NOT DETECTED NOT DETECTED Final   Coronavirus NL63 NOT DETECTED NOT DETECTED Final   Coronavirus OC43 NOT DETECTED NOT DETECTED Final   Metapneumovirus NOT DETECTED NOT DETECTED Final   Rhinovirus / Enterovirus NOT DETECTED NOT  DETECTED Final   Influenza A NOT DETECTED NOT DETECTED Final   Influenza B NOT DETECTED NOT DETECTED Final   Parainfluenza Virus 1 NOT DETECTED NOT DETECTED Final   Parainfluenza Virus 2 NOT DETECTED NOT DETECTED Final   Parainfluenza Virus 3 NOT DETECTED NOT DETECTED Final   Parainfluenza Virus 4 NOT DETECTED NOT DETECTED Final   Respiratory Syncytial Virus NOT DETECTED NOT DETECTED Final   Bordetella pertussis NOT DETECTED NOT DETECTED Final   Bordetella Parapertussis NOT DETECTED NOT DETECTED Final   Chlamydophila pneumoniae NOT DETECTED NOT DETECTED Final   Mycoplasma pneumoniae NOT DETECTED NOT DETECTED Final    Comment: Performed at [facility], [address], [place], [place] [address]  Expectorated Sputum Assessment w Gram Stain, Rflx to Resp Cult     Status: None   Collection Time: 09/09/24 11:05 PM   Specimen: Expectorated Sputum  Result Value Ref Range Status   Specimen Description EXPECTORATED SPUTUM  Final   Special Requests NONE  Final   Sputum evaluation   Final    Sputum specimen not acceptable for testing.  Please recollect.   Performed at [facility], [address], [place], [place] [address]    Report Status 09/10/2024 FINAL  Final         Radiology Studies: DG Chest Port 1 View Result Date: 09/09/2024 EXAM: 1 VIEW(S) XRAY OF THE CHEST 09/09/2024 08:31:00 AM COMPARISON: 07/18/2024 CLINICAL HISTORY: sob, copd. Table formatting from the original note was not included.; patient coming in from home SOB for 2 weeks. Patient coming in on c-pap via EMS. ; hx of asthma, copd,. inital o2 67% on 4L [place] that she wears at home. patient was blue and mottled with ac3essory muscle use. 5 mg allbuterol, 2mg  mag, 125mg  solumed. Lung sounds were absent for EMS FINDINGS: LUNGS AND PLEURA: Hyperinflation with emphysema. No focal pulmonary opacity. No pulmonary edema. No pleural effusion. No pneumothorax. HEART AND MEDIASTINUM: Aortic atherosclerosis. No acute  abnormality of the cardiac and mediastinal silhouettes. BONES AND SOFT TISSUES: No acute osseous abnormality. IMPRESSION: 1. Hyperinflation with emphysema, consistent with COPD. 2. Aortic atherosclerosis. Electronically signed by: Evalene Coho MD 09/09/2024 08:38 AM EDT RP Workstation: [id number]        Scheduled Meds:  aspirin  EC  81 mg Oral Daily   budesonide-glycopyrrolate -formoterol  2 puff Inhalation BID   cyanocobalamin   1,000 mcg Intramuscular Once   doxycycline   100 mg Oral Q12H   enoxaparin  (LOVENOX ) injection  40 mg Subcutaneous Q24H   ezetimibe   10 mg Oral Daily   feeding supplement  237 mL Oral BID BM   ipratropium-albuterol   3 mL Nebulization Q6H   montelukast   10 mg Oral QHS   mouth rinse  15 mL Mouth Rinse 4 times per day   pantoprazole  40 mg Oral Daily   [START ON 09/11/2024] predniSONE   40 mg Oral Q breakfast   QUEtiapine  25 mg Oral QHS   Continuous Infusions:   LOS: 1 day  Anthony CHRISTELLA Pouch, MD [facility] Pager [phone]  If 7PM-7AM, please contact night-coverage [email or web address] 09/10/2024, 9:18 AM

## 2024-09-10 NOTE — Progress Notes (Signed)
 Patient called on call stating she has been admitted to [facility] room 250 for shortness of breath and would like Dr PARRIS to see her while she is there if he is on call.

## 2024-09-10 NOTE — Plan of Care (Signed)
  Problem: Education: Goal: Knowledge of General Education information will improve Description: Including pain rating scale, medication(s)/side effects and non-pharmacologic comfort measures Outcome: Progressing   Problem: Clinical Measurements: Goal: Diagnostic test results will improve Outcome: Progressing Goal: Respiratory complications will improve Outcome: Progressing Goal: Cardiovascular complication will be avoided Outcome: Progressing   Problem: Activity: Goal: Risk for activity intolerance will decrease Outcome: Progressing   Problem: Coping: Goal: Level of anxiety will decrease Outcome: Progressing   Problem: Safety: Goal: Ability to remain free from injury will improve Outcome: Progressing

## 2024-09-10 NOTE — Plan of Care (Signed)
?  Problem: Education: ?Goal: Knowledge of General Education information will improve ?Description: Including pain rating scale, medication(s)/side effects and non-pharmacologic comfort measures ?Outcome: Progressing ?  ?Problem: Clinical Measurements: ?Goal: Ability to maintain clinical measurements within normal limits will improve ?Outcome: Progressing ?Goal: Will remain free from infection ?Outcome: Progressing ?Goal: Diagnostic test results will improve ?Outcome: Progressing ?Goal: Respiratory complications will improve ?Outcome: Progressing ?Goal: Cardiovascular complication will be avoided ?Outcome: Progressing ?  ?Problem: Nutrition: ?Goal: Adequate nutrition will be maintained ?Outcome: Progressing ?  ?Problem: Safety: ?Goal: Ability to remain free from injury will improve ?Outcome: Progressing ?  ?Problem: Skin Integrity: ?Goal: Risk for impaired skin integrity will decrease ?Outcome: Progressing ?  ?

## 2024-09-11 DIAGNOSIS — J441 Chronic obstructive pulmonary disease with (acute) exacerbation: Secondary | ICD-10-CM | POA: Diagnosis not present

## 2024-09-11 LAB — CBC
HCT: 37 % (ref 36.0–46.0)
Hemoglobin: 12.1 g/dL (ref 12.0–15.0)
MCH: 31.9 pg (ref 26.0–34.0)
MCHC: 32.7 g/dL (ref 30.0–36.0)
MCV: 97.6 fL (ref 80.0–100.0)
Platelets: 291 K/uL (ref 150–400)
RBC: 3.79 MIL/uL — ABNORMAL LOW (ref 3.87–5.11)
RDW: 12.2 % (ref 11.5–15.5)
WBC: 11.9 K/uL — ABNORMAL HIGH (ref 4.0–10.5)
nRBC: 0 % (ref 0.0–0.2)

## 2024-09-11 LAB — BASIC METABOLIC PANEL WITH GFR
Anion gap: 9 (ref 5–15)
BUN: 29 mg/dL — ABNORMAL HIGH (ref 8–23)
CO2: 30 mmol/L (ref 22–32)
Calcium: 9 mg/dL (ref 8.9–10.3)
Chloride: 101 mmol/L (ref 98–111)
Creatinine, Ser: 0.55 mg/dL (ref 0.44–1.00)
GFR, Estimated: >60 mL/min (ref >60)
Glucose, Bld: 127 mg/dL — ABNORMAL HIGH (ref 70–99)
Potassium: 4.8 mmol/L (ref 3.5–5.1)
Sodium: 140 mmol/L (ref 135–145)

## 2024-09-11 LAB — C-REACTIVE PROTEIN: CRP: 0.5 mg/dL (ref <1.0)

## 2024-09-11 MED ORDER — DOCUSATE SODIUM 100 MG PO CAPS
200.0000 mg | ORAL_CAPSULE | Freq: Two times a day (BID) | ORAL | Status: DC
  Administered 2024-09-11 – 2024-09-15 (×8): 200 mg via ORAL
  Filled 2024-09-11 (×9): qty 2

## 2024-09-11 MED ORDER — POLYETHYLENE GLYCOL 3350 17 G PO PACK: 17.0000 g | PACK | Freq: Every day | ORAL | Status: DC | PRN

## 2024-09-11 NOTE — Evaluation (Signed)
 Physical Therapy Evaluation Patient Details Name: Brandi [name] MRN: [id number] DOB: [birth year] Today's Date: 09/11/2024  History of Present Illness  68 y.o. female with medical history significant of COPD Gold stage III with chronic hypoxic respite failure on 3 L continuously and chronic steroid, HLD, anxiety/depression, presented with worsening cough wheezing shortness of breath x2wks.  Clinical Impression  Patient seated in recliner upon arrival to session; alert and oriented, follows commands and agreeable to participation with session.  Denies pain; does endorse some improvement in respiratory status since admission. Bilat UE/LE strength and ROM generally weak and deconditioned, but grossly functional for basic transfers and gait; no focal weakness appreciated.  Able to complete sit/stand, basic transfers and gait (110' x2) with RW, close sup.  Demonstrates reciprocal stepping pattern, moderate reliance on RW (for stability and energy conservation). Slow, but steady, cadence and overall gait performance. No buckling or LOB. BORG 8/10 with 110' distance, requiring 5 min seated rest period for recovery. Sats >90% on 4L throughout distance  Unable to tolerate additional distance/activity due to overall fatigue; unable to demonstrate ability to safely/indep complete tasks necessary for return home alone at this time. Would benefit from skilled PT to address above deficits and promote optimal return to PLOF.; recommend post-acute PT follow up as indicated by interdisciplinary care team.          If plan is discharge home, recommend the following: A little help with walking and/or transfers;A little help with bathing/dressing/bathroom   Can travel by private vehicle        Equipment Recommendations Rolling walker (2 wheels)  Recommendations for Other Services       Functional Status Assessment Patient has had a recent decline in their functional status and demonstrates the ability to make  significant improvements in function in a reasonable and predictable amount of time.     Precautions / Restrictions Precautions Precautions: Fall Restrictions Weight Bearing Restrictions Per Provider Order: No      Mobility  Bed Mobility               General bed mobility comments: seated in recliner beginning/end of treatment session    Transfers Overall transfer level: Needs assistance Equipment used: Rolling walker (2 wheels) Transfers: Sit to/from Stand Sit to Stand: Supervision                Ambulation/Gait Ambulation/Gait assistance: Supervision Gait Distance (Feet): 110 Feet (x2) Assistive device: Rolling walker (2 wheels)         General Gait Details: reciprocal stepping pattern, moderate reliance on RW (for stability and energy conservation). Slow, but steady, cadence and overall gait performance. No buckling or LOB.  BORG 8/10 with 110' distance, requiring 5 min seated rest period for recovery.  Sats >90% on 4L throughout distance  Stairs            Wheelchair Mobility     Tilt Bed    Modified Rankin (Stroke Patients Only)       Balance Overall balance assessment: Needs assistance Sitting-balance support: No upper extremity supported, Feet supported Sitting balance-Leahy Scale: Good     Standing balance support: Bilateral upper extremity supported, During functional activity Standing balance-Leahy Scale: Fair                               Pertinent Vitals/Pain Pain Assessment Pain Assessment: No/denies pain    Home Living Family/patient expects to be discharged to:: Private residence Living  Arrangements: Alone Available Help at Discharge: Other (Comment);Available PRN/intermittently;Family;Neighbor;Friend(s) Type of Home: House Home Access: Stairs to enter Entrance Stairs-Rails: Right Entrance Stairs-Number of Steps: 3   Home Layout: One level        Prior Function Prior Level of Function : Needs  assist;Driving             Mobility Comments: Indep with ADLs, household and community mobilization without assist device; home O2 at 4L at night, recently using more consistently (describes pulsed O2).  Denies fall history.       Extremity/Trunk Assessment   Upper Extremity Assessment Upper Extremity Assessment: Generalized weakness    Lower Extremity Assessment Lower Extremity Assessment: Generalized weakness (grossly at least 4-/5 throughout)       Communication   Communication Communication: No apparent difficulties    Cognition Arousal: Alert Behavior During Therapy: WFL for tasks assessed/performed   PT - Cognitive impairments: No apparent impairments                         Following commands: Intact       Cueing Cueing Techniques: Verbal cues     General Comments      Exercises     Assessment/Plan    PT Assessment Patient needs continued PT services  PT Problem List Decreased activity tolerance;Decreased balance;Decreased mobility;Decreased knowledge of use of DME;Decreased safety awareness;Decreased knowledge of precautions;Cardiopulmonary status limiting activity       PT Treatment Interventions DME instruction;Gait training;Functional mobility training;Therapeutic activities;Therapeutic exercise;Balance training;Patient/family education    PT Goals (Current goals can be found in the Care Plan section)  Acute Rehab PT Goals Patient Stated Goal: to get my strength up PT Goal Formulation: With patient Time For Goal Achievement: 09/25/24 Potential to Achieve Goals: Good    Frequency Min 2X/week     Co-evaluation               AM-PAC PT 6 Clicks Mobility  Outcome Measure Help needed turning from your back to your side while in a flat bed without using bedrails?: None Help needed moving from lying on your back to sitting on the side of a flat bed without using bedrails?: None Help needed moving to and from a bed to a chair  (including a wheelchair)?: None Help needed standing up from a chair using your arms (e.g., wheelchair or bedside chair)?: None Help needed to walk in hospital room?: A Little Help needed climbing 3-5 steps with a railing? : A Little 6 Click Score: 22    End of Session Equipment Utilized During Treatment: Oxygen  Activity Tolerance: Patient tolerated treatment well Patient left: in chair;with call bell/phone within reach;with chair alarm set Nurse Communication: Mobility status PT Visit Diagnosis: Muscle weakness (generalized) (M62.81);Difficulty in walking, not elsewhere classified (R26.2)    Time: 8461-8388 PT Time Calculation (min) (ACUTE ONLY): 33 min   Charges:   PT Evaluation $PT Eval Moderate Complexity: 1 Mod   PT General Charges $$ ACUTE PT VISIT: 1 Visit         Garek Schuneman H. Delores, PT, DPT, NCS 09/11/24, 4:54 PM [phone]

## 2024-09-11 NOTE — Progress Notes (Signed)
 PROGRESS NOTE    Brandi [name]  FMW:[id number] DOB: Jun 13, 1956 DOA: 09/09/2024 PCP: Edman [name], DO   Assessment & Plan:   Principal Problem:   COPD exacerbation (HCC) Active Problems:   COPD with acute exacerbation (HCC)  Assessment and Plan: Acute on chronic hypoxic respiratory failure: increased dyspnea today. Continue on supplemental oxygen , back on baseline oxygen  of 4L [place]. Continue on steroids, bronchodilators & encourage incentive spirometry  Acute COPD exacerbation: continue on IV steroids, doxy, bronchodilators, & encourage incentive spirometry. Resp viral panel, influenza, COVID19 & RSV are all neg. Pt wore BiPAP for short period of time and then refused it on 09/10/24. Pulmon consulted as per request of pt, Dr. Parris    Depression: severity unknown. Continue on home dose of buspar    GERD: continue on PPI   Moderate protein calorie malnutrition: continue on protein-calorie malnutrition       DVT prophylaxis: lovenox   Code Status: full  Family Communication: discussed pt's care w/ pt's daughter, [name], and answered her questions  Disposition Plan: likely d/c back home  Level of care: Progressive  Status is: Inpatient Remains inpatient appropriate because: severity of illness    Consultants:  pulmon  Procedures:   Antimicrobials: doxy  Subjective: Pt c/o shortness of breath   Objective: Vitals:   09/10/24 2013 09/10/24 2311 09/11/24 0030 09/11/24 0715  BP:  113/65  119/68  Pulse:  (!) 108    Resp:  18 19 16   Temp:  (!) 97.5 F (36.4 C)  97.8 F (36.6 C)  TempSrc:      SpO2: 94% 92%    Weight:      Height:        Intake/Output Summary (Last 24 hours) at 09/11/2024 0932 Last data filed at 09/11/2024 0830 Gross per 24 hour  Intake 600 ml  Output 800 ml  Net -200 ml   Filed Weights   09/09/24 0817  Weight: 54.6 kg    Examination:  General exam: appears uncomfortable. Frail appearing  Respiratory system: diminished  breath sounds b/l. Wheezes b/l Cardiovascular system: S1/S2+. No rubs or clicks Gastrointestinal system: abd is soft, NT, ND & hypoactive bowel sounds Central nervous system: alert & awake. Moves all extremities Psychiatry: judgement and insight appears at baseline. Flat mood and affect    Data Reviewed: I have personally reviewed following labs and imaging studies  CBC: Recent Labs  Lab 09/09/24 0825 09/10/24 0416 09/11/24 0526  WBC 10.3 8.3 11.9*  HGB 13.3 11.7* 12.1  HCT 41.5 35.4* 37.0  MCV 98.3 95.9 97.6  PLT 284 253 291   Basic Metabolic Panel: Recent Labs  Lab 09/09/24 0825 09/11/24 0526  NA 135 140  K 3.9 4.8  CL 94* 101  CO2 27 30  GLUCOSE 158* 127*  BUN 15 29*  CREATININE 0.59 0.55  CALCIUM  8.7* 9.0   GFR: Estimated Creatinine Clearance: 58 mL/min (by C-G formula based on SCr of 0.55 mg/dL). Liver Function Tests: Recent Labs  Lab 09/09/24 0825  AST 23  ALT 19  ALKPHOS 36*  BILITOT 0.6  PROT 6.7  ALBUMIN 3.7   No results for input(s): LIPASE, AMYLASE in the last 168 hours. No results for input(s): AMMONIA in the last 168 hours. Coagulation Profile: No results for input(s): INR, PROTIME in the last 168 hours. Cardiac Enzymes: No results for input(s): CKTOTAL, CKMB, CKMBINDEX, TROPONINI in the last 168 hours. BNP (last 3 results) No results for input(s): PROBNP in the last 8760 hours. HbA1C: No results for  input(s): HGBA1C in the last 72 hours. CBG: No results for input(s): GLUCAP in the last 168 hours. Lipid Profile: No results for input(s): CHOL, HDL, LDLCALC, TRIG, CHOLHDL, LDLDIRECT in the last 72 hours. Thyroid  Function Tests: No results for input(s): TSH, T4TOTAL, FREET4, T3FREE, THYROIDAB in the last 72 hours. Anemia Panel: No results for input(s): VITAMINB12, FOLATE, FERRITIN, TIBC, IRON, RETICCTPCT in the last 72 hours. Sepsis Labs: No results for input(s): PROCALCITON,  LATICACIDVEN in the last 168 hours.  Recent Results (from the past 240 hours)  Resp panel by RT-PCR (RSV, Flu A&B, Covid) Anterior Nasal Swab     Status: None   Collection Time: 09/09/24  8:25 AM   Specimen: Anterior Nasal Swab  Result Value Ref Range Status   SARS Coronavirus 2 by RT PCR NEGATIVE NEGATIVE Final    Comment: (NOTE) SARS-CoV-2 target nucleic acids are NOT DETECTED.  The SARS-CoV-2 RNA is generally detectable in upper respiratory specimens during the acute phase of infection. The lowest concentration of SARS-CoV-2 viral copies this assay can detect is 138 copies/mL. A negative result does not preclude SARS-Cov-2 infection and should not be used as the sole basis for treatment or other patient management decisions. A negative result may occur with  improper specimen collection/handling, submission of specimen other than nasopharyngeal swab, presence of viral mutation(s) within the areas targeted by this assay, and inadequate number of viral copies(<138 copies/mL). A negative result must be combined with clinical observations, patient history, and epidemiological information. The expected result is Negative.  Fact Sheet for Patients:  BloggerCourse.com  Fact Sheet for Healthcare Providers:  SeriousBroker.it  This test is no t yet approved or cleared by the [place]  FDA and  has been authorized for detection and/or diagnosis of SARS-CoV-2 by FDA under an Emergency Use Authorization (EUA). This EUA will remain  in effect (meaning this test can be used) for the duration of the COVID-19 declaration under Section 564(b)(1) of the Act, 21 U.S.C.section 360bbb-3(b)(1), unless the authorization is terminated  or revoked sooner.       Influenza A by PCR NEGATIVE NEGATIVE Final   Influenza B by PCR NEGATIVE NEGATIVE Final    Comment: (NOTE) The Xpert Xpress SARS-CoV-2/FLU/RSV plus assay is intended as an aid in the  diagnosis of influenza from Nasopharyngeal swab specimens and should not be used as a sole basis for treatment. Nasal washings and aspirates are unacceptable for Xpert Xpress SARS-CoV-2/FLU/RSV testing.  Fact Sheet for Patients: BloggerCourse.com  Fact Sheet for Healthcare Providers: SeriousBroker.it  This test is not yet approved or cleared by the [place]  FDA and has been authorized for detection and/or diagnosis of SARS-CoV-2 by FDA under an Emergency Use Authorization (EUA). This EUA will remain in effect (meaning this test can be used) for the duration of the COVID-19 declaration under Section 564(b)(1) of the Act, 21 U.S.C. section 360bbb-3(b)(1), unless the authorization is terminated or revoked.     Resp Syncytial Virus by PCR NEGATIVE NEGATIVE Final    Comment: (NOTE) Fact Sheet for Patients: BloggerCourse.com  Fact Sheet for Healthcare Providers: SeriousBroker.it  This test is not yet approved or cleared by the [place]  FDA and has been authorized for detection and/or diagnosis of SARS-CoV-2 by FDA under an Emergency Use Authorization (EUA). This EUA will remain in effect (meaning this test can be used) for the duration of the COVID-19 declaration under Section 564(b)(1) of the Act, 21 U.S.C. section 360bbb-3(b)(1), unless the authorization is terminated or revoked.  Performed at [facility], [address] [place], [place] [address]   Respiratory (~20 pathogens) panel by PCR     Status: None   Collection Time: 09/09/24 10:39 AM   Specimen: Nasopharyngeal Swab; Respiratory  Result Value Ref Range Status   Adenovirus NOT DETECTED NOT DETECTED Final   Coronavirus 229E NOT DETECTED NOT DETECTED Final    Comment: (NOTE) The Coronavirus on the Respiratory Panel, DOES NOT test for the novel  Coronavirus (2019 nCoV)    Coronavirus HKU1 NOT  DETECTED NOT DETECTED Final   Coronavirus NL63 NOT DETECTED NOT DETECTED Final   Coronavirus OC43 NOT DETECTED NOT DETECTED Final   Metapneumovirus NOT DETECTED NOT DETECTED Final   Rhinovirus / Enterovirus NOT DETECTED NOT DETECTED Final   Influenza A NOT DETECTED NOT DETECTED Final   Influenza B NOT DETECTED NOT DETECTED Final   Parainfluenza Virus 1 NOT DETECTED NOT DETECTED Final   Parainfluenza Virus 2 NOT DETECTED NOT DETECTED Final   Parainfluenza Virus 3 NOT DETECTED NOT DETECTED Final   Parainfluenza Virus 4 NOT DETECTED NOT DETECTED Final   Respiratory Syncytial Virus NOT DETECTED NOT DETECTED Final   Bordetella pertussis NOT DETECTED NOT DETECTED Final   Bordetella Parapertussis NOT DETECTED NOT DETECTED Final   Chlamydophila pneumoniae NOT DETECTED NOT DETECTED Final   Mycoplasma pneumoniae NOT DETECTED NOT DETECTED Final    Comment: Performed at [facility], [address] [place], [place] [address]  Expectorated Sputum Assessment w Gram Stain, Rflx to Resp Cult     Status: None   Collection Time: 09/09/24 11:05 PM   Specimen: Expectorated Sputum  Result Value Ref Range Status   Specimen Description EXPECTORATED SPUTUM  Final   Special Requests NONE  Final   Sputum evaluation   Final    Sputum specimen not acceptable for testing.  Please recollect.   Performed at [facility], [address], [place], [place] [address]    Report Status 09/10/2024 FINAL  Final         Radiology Studies: No results found.       Scheduled Meds:  aspirin  EC  81 mg Oral Daily   budesonide-glycopyrrolate -formoterol  2 puff Inhalation BID   cyanocobalamin   1,000 mcg Intramuscular Once   docusate sodium  200 mg Oral BID   doxycycline   100 mg Oral Q12H   enoxaparin  (LOVENOX ) injection  40 mg Subcutaneous Q24H   ezetimibe   10 mg Oral Daily   feeding supplement  237 mL Oral BID BM   ipratropium-albuterol   3 mL Nebulization Q6H   methylPREDNISolone  (SOLU-MEDROL )  injection  40 mg Intravenous Q12H   montelukast   10 mg Oral QHS   mouth rinse  15 mL Mouth Rinse 4 times per day   pantoprazole  40 mg Oral Daily   QUEtiapine  25 mg Oral QHS   Continuous Infusions:   LOS: 2 days      Anthony CHRISTELLA Pouch, MD [facility] Pager [phone]  If 7PM-7AM, please contact night-coverage [email or web address] 09/11/2024, 9:32 AM

## 2024-09-11 NOTE — Care Management Important Message (Signed)
 Important Message  Patient Details  Name: Brandi [name] MRN: [id number] Date of Birth: March 29, 1956   Important Message Given:  Yes - Medicare IM     [name] 09/11/2024, 2:38 PM

## 2024-09-11 NOTE — Progress Notes (Signed)
 PT Cancellation Note  Patient Details Name: Gwendoline Judy MRN: [id number] DOB: 02/13/1956   Cancelled Treatment:    Reason Eval/Treat Not Completed: Fatigue/lethargy limiting ability to participate (Consult received and chart reviewed. Upon arrival to room, patient just finishing up OT evaluation; notably fatigued and unable to tolerate additional mobility at this time. Will re-attempt at later time/date as appropriate)  Mirra Basilio H. Delores, PT, DPT, NCS 09/11/24, 11:35 AM [phone]

## 2024-09-11 NOTE — Evaluation (Signed)
 Occupational Therapy Evaluation Patient Details Name: Brandi [name] MRN: [id number] DOB: January 08, 1956 Today's Date: 09/11/2024   History of Present Illness   68 y.o. female with medical history significant of COPD Gold stage III with chronic hypoxic respite failure on 3 L continuously and chronic steroid, HLD, anxiety/depression, presented with worsening cough wheezing shortness of breath x2wks.     Clinical Impressions Pt was seen for OT evaluation this date. Prior to hospital admission, pt was indep with ADL and mobility up until the past 2 weeks when she began to feel more SOB and requiring O2 during the day (was only using 4L overnight prior to that), requiring assist for caring for her horse and goat, groceries, and medical appts. Pt lives alone and daughter lives 2hrs away. Has a neighbor who helps a little. Pt presents with deficits in strength, activity tolerance, balance, and cardiopulmonary status, affecting safe and optimal ADL completion. Pt currently requires MAX A for LB ADL Tasks, CGA-MIN A for ADL transfers and very short distance mobility with RW, and MAX A for pericare and clothing mgt in standing. HR up to mid 130's and SpO2 ~93% on 4L. Pt edu in PLB and activity pacing requiring intermittent VC throughout session. Pt endorsed feeling 9/10 SOB throughout session. Pt would benefit from skilled OT services to address noted impairments and functional limitations (see below for any additional details) in order to maximize safety and independence while minimizing future risk of falls, injury, and readmission. Anticipate the need for follow up OT services upon acute hospital DC.    If plan is discharge home, recommend the following:   A little help with walking and/or transfers;A lot of help with bathing/dressing/bathroom;Assistance with cooking/housework;Assist for transportation;Help with stairs or ramp for entrance     Functional Status Assessment   Patient has had a recent  decline in their functional status and demonstrates the ability to make significant improvements in function in a reasonable and predictable amount of time.     Equipment Recommendations   BSC/3in1;Other (comment) (2WW)     Recommendations for Other Services         Precautions/Restrictions   Precautions Precautions: Fall Precaution/Restrictions Comments: monitor HR/O2 Restrictions Weight Bearing Restrictions Per Provider Order: No     Mobility Bed Mobility               General bed mobility comments: NT, sitting EOB Patient Response: Anxious  Transfers Overall transfer level: Needs assistance Equipment used: Rolling walker (2 wheels) Transfers: Sit to/from Stand, Bed to chair/wheelchair/BSC Sit to Stand: Contact guard assist     Step pivot transfers: Contact guard assist, Min assist     General transfer comment: VC for RW mgt      Balance Overall balance assessment: Needs assistance Sitting-balance support: Single extremity supported, Feet supported Sitting balance-Leahy Scale: Fair     Standing balance support: Bilateral upper extremity supported, During functional activity, Reliant on assistive device for balance Standing balance-Leahy Scale: Fair Standing balance comment: fairly unsteady/shaky                           ADL either performed or assessed with clinical judgement   ADL Overall ADL's : Needs assistance/impaired     Grooming: Sitting;Set up   Upper Body Bathing: Sitting;Minimal assistance   Lower Body Bathing: Sit to/from stand;Moderate assistance       Lower Body Dressing: Sit to/from stand;Moderate assistance Lower Body Dressing Details (indicate cue type and reason):  required BUE support on RW for stability Toilet Transfer: Ambulation;BSC/3in1;Rolling walker (2 wheels);Contact guard assist   Toileting- Clothing Manipulation and Hygiene: Sit to/from stand;Minimal assistance;Moderate assistance Toileting -  Clothing Manipulation Details (indicate cue type and reason): set up and supv for seated pericare, MIN A-MOD for clothing mgt     Functional mobility during ADLs: Contact guard assist;Rolling walker (2 wheels)       Vision         Perception         Praxis         Pertinent Vitals/Pain Pain Assessment Pain Assessment: No/denies pain     Extremity/Trunk Assessment Upper Extremity Assessment Upper Extremity Assessment: Generalized weakness   Lower Extremity Assessment Lower Extremity Assessment: Generalized weakness       Communication Communication Communication: No apparent difficulties   Cognition Arousal: Alert Behavior During Therapy: Anxious Cognition: No apparent impairments                               Following commands: Intact       Cueing  General Comments   Cueing Techniques: Verbal cues  pt reports 9/10 SOB   Exercises Other Exercises Other Exercises: Pt edu in PLB and activity pacing   Shoulder Instructions      Home Living Family/patient expects to be discharged to:: Private residence Living Arrangements: Alone (spouse passed in 2022) Available Help at Discharge: Other (Comment);Available PRN/intermittently;Family;Neighbor;Friend(s) (dtr lives 2hrs away) Type of Home: House Home Access: Stairs to enter Secretary/administrator of Steps: 3 Entrance Stairs-Rails: Right Home Layout: One level     Bathroom Shower/Tub: Producer, television/film/video: Handicapped height     Home Equipment: Grab bars - tub/shower;Grab bars - toilet          Prior Functioning/Environment Prior Level of Function : Needs assist;Driving             Mobility Comments: indep, limited by SOB ADLs Comments: 4L at night and has relied on 4L during the day the past 2 weeks 2/2 SOB, has relied on friend to get groceries lately, dtr vs friend helping wiht dr appts lately, friend helping with goat, horse    OT Problem List:  Cardiopulmonary status limiting activity;Decreased strength;Decreased activity tolerance;Impaired balance (sitting and/or standing);Decreased knowledge of use of DME or AE   OT Treatment/Interventions: Self-care/ADL training;Therapeutic exercise;Therapeutic activities;Energy conservation;DME and/or AE instruction;Patient/family education;Balance training      OT Goals(Current goals can be found in the care plan section)   Acute Rehab OT Goals Patient Stated Goal: breathe better OT Goal Formulation: With patient Time For Goal Achievement: 09/25/24 Potential to Achieve Goals: Good ADL Goals Pt Will Perform Upper Body Dressing: sitting;with modified independence Pt Will Perform Lower Body Dressing: sit to/from stand;with supervision Pt Will Transfer to Toilet: ambulating;bedside commode;with supervision (LRAD) Pt Will Perform Toileting - Clothing Manipulation and hygiene: sit to/from stand;with supervision Additional ADL Goal #1: Pt will verbalize plan to implement at least 2 learned ECS to improve safety/indep with ADL/IADL.   OT Frequency:  Min 2X/week    Co-evaluation              AM-PAC OT 6 Clicks Daily Activity     Outcome Measure Help from another person eating meals?: None Help from another person taking care of personal grooming?: None Help from another person toileting, which includes using toliet, bedpan, or urinal?: A Little Help from another person bathing (including  washing, rinsing, drying)?: A Lot Help from another person to put on and taking off regular upper body clothing?: A Little Help from another person to put on and taking off regular lower body clothing?: A Lot 6 Click Score: 18   End of Session Equipment Utilized During Treatment: Rolling walker (2 wheels);Oxygen  Nurse Communication: Other (comment) (pt requesting inhaler)  Activity Tolerance: Other (comment) (SOB) Patient left: in chair;with call bell/phone within reach;with chair alarm set  OT  Visit Diagnosis: Unsteadiness on feet (R26.81);Other abnormalities of gait and mobility (R26.89);Muscle weakness (generalized) (M62.81)                Time: 8944-8868 OT Time Calculation (min): 36 min Charges:  OT General Charges $OT Visit: 1 Visit OT Evaluation $OT Eval Moderate Complexity: 1 Mod OT Treatments $Self Care/Home Management : 23-37 mins  Warren SAUNDERS., MPH, MS, OTR/L ascom [phone] 09/11/24, 11:40 AM

## 2024-09-11 NOTE — Plan of Care (Signed)
  Problem: Education: Goal: Knowledge of General Education information will improve Description: Including pain rating scale, medication(s)/side effects and non-pharmacologic comfort measures Outcome: Progressing   Problem: Clinical Measurements: Goal: Ability to maintain clinical measurements within normal limits will improve Outcome: Progressing Goal: Will remain free from infection Outcome: Progressing Goal: Diagnostic test results will improve Outcome: Progressing Goal: Respiratory complications will improve Outcome: Progressing Goal: Cardiovascular complication will be avoided Outcome: Progressing   Problem: Activity: Goal: Risk for activity intolerance will decrease Outcome: Progressing   Problem: Nutrition: Goal: Adequate nutrition will be maintained Outcome: Progressing   Problem: Coping: Goal: Level of anxiety will decrease Outcome: Progressing   Problem: Safety: Goal: Ability to remain free from injury will improve Outcome: Progressing   Problem: Respiratory: Goal: Ability to maintain a clear airway will improve Outcome: Progressing Goal: Levels of oxygenation will improve Outcome: Progressing Goal: Ability to maintain adequate ventilation will improve Outcome: Progressing

## 2024-09-11 NOTE — Consult Note (Signed)
 PULMONOLOGY         Date: 09/11/2024,   MRN# [id number] Brandi [name] [birth year]     AdmissionWeight: 54.6 kg                 CurrentWeight: 54.6 kg  Referring provider: Dr Trudy    CHIEF COMPLAINT:   Acute exacerbation of Asthma and COPD overlap   HISTORY OF PRESENT ILLNESS   This is a 68 year old female with a history of asthma and COPD overlap syndrome with chronic hypoxemic respiratory failure utilizing 4 L/min at rest and anxiety disorder.  She also has a background of dyslipidemia.  She has been on inhaler therapy for COPD and asthma as well as recent introduction of prednisone  at 5 mg/day with Bactrim  thrice weekly.  She felt initially improved but reports approximately 1 week ago onset of chest discomfort with worsening hypoxemia.  On admission she had a viral workup done and is negative for 20 species respiratory viral panel as well as negative for RSV/COVID/flu.  She is being treated for acute exacerbation of asthma and COPD with Solu-Medrol  40 mg as well as nebulizer therapy and her home Breztri  twice daily she also has rhinitis and is taking Singulair  for this which has been continued.   PAST MEDICAL HISTORY   Past Medical History:  Diagnosis Date   Anxiety    COPD (chronic obstructive pulmonary disease) (HCC)    Hyperlipidemia      SURGICAL HISTORY   Past Surgical History:  Procedure Laterality Date   APPENDECTOMY     CHOLECYSTECTOMY     SHOULDER ARTHROSCOPY Left 07/02/2015   Procedure: ,left shoulder arthroscopy, decompression and debridement;  Surgeon: Norleen JINNY Maltos, MD;  Location: [facility];  Service: Orthopedics;  Laterality: Left;   SHOULDER CLOSED REDUCTION Left 04/09/2015   Procedure: CLOSED MANIPULATION SHOULDER;  Surgeon: Ozell Flake, MD;  Location: [facility];  Service: Orthopedics;  Laterality: Left;   TUBAL LIGATION       FAMILY HISTORY   Family History  Problem Relation Age of Onset   Breast cancer Mother 77   Alzheimer's  disease Mother      SOCIAL HISTORY   Social History   Tobacco Use   Smoking status: Former    Current packs/day: 0.00    Average packs/day: 0.8 packs/day for 41.0 years (30.8 ttl pk-yrs)    Types: Cigarettes    Start date: 12/07/1974    Quit date: 12/08/2015    Years since quitting: 8.7   Smokeless tobacco: Never  Vaping Use   Vaping status: Never Used  Substance Use Topics   Alcohol use: No   Drug use: No     MEDICATIONS    Home Medication:    Current Medication:  Current Facility-Administered Medications:    albuterol  (PROVENTIL ) (2.5 MG/3ML) 0.083% nebulizer solution 3 mL, 3 mL, Inhalation, Q4H PRN, Laurita Manor T, MD, 3 mL at 09/11/24 1232   aspirin  EC tablet 81 mg, 81 mg, Oral, Daily, Laurita Manor T, MD, 81 mg at 09/11/24 9192   benzonatate (TESSALON) capsule 100 mg, 100 mg, Oral, TID PRN, Zhang, Ping T, MD   budesonide-glycopyrrolate -formoterol (BREZTRI ) 160-9-4.8 MCG/ACT inhaler 2 puff, 2 puff, Inhalation, BID, Laurita Manor T, MD, 2 puff at 09/11/24 9188   busPIRone  (BUSPAR ) tablet 7.5 mg, 7.5 mg, Oral, BID PRN, Laurita Manor T, MD, 7.5 mg at 09/11/24 9191   cyanocobalamin  (VITAMIN B12) injection 1,000 mcg, 1,000 mcg, Intramuscular, Once, Laurita Manor DASEN, MD   docusate sodium (  COLACE) capsule 200 mg, 200 mg, Oral, BID, Trudy Anthony HERO, MD, 200 mg at 09/11/24 1005   doxycycline  (VIBRA -TABS) tablet 100 mg, 100 mg, Oral, Q12H, Laurita Manor T, MD, 100 mg at 09/11/24 9193   enoxaparin  (LOVENOX ) injection 40 mg, 40 mg, Subcutaneous, Q24H, Zhang, Ping T, MD, 40 mg at 09/10/24 2133   ezetimibe  (ZETIA ) tablet 10 mg, 10 mg, Oral, Daily, Laurita Manor T, MD, 10 mg at 09/11/24 9193   famotidine  (PEPCID ) tablet 20 mg, 20 mg, Oral, BID PRN, Zhang, Ping T, MD   feeding supplement (ENSURE ENLIVE / ENSURE PLUS) liquid 237 mL, 237 mL, Oral, BID BM, Laurita Manor T, MD, 237 mL at 09/11/24 0811   ipratropium-albuterol  (DUONEB) 0.5-2.5 (3) MG/3ML nebulizer solution 3 mL, 3 mL, Nebulization, Q6H,  Laurita Manor T, MD, 3 mL at 09/11/24 0736   methylPREDNISolone  sodium succinate (SOLU-MEDROL ) 40 mg/mL injection 40 mg, 40 mg, Intravenous, Q12H, Trudy Anthony HERO, MD, 40 mg at 09/11/24 0736   metoprolol tartrate (LOPRESSOR) injection 5 mg, 5 mg, Intravenous, Q6H PRN, Trudy Anthony HERO, MD   montelukast  (SINGULAIR ) tablet 10 mg, 10 mg, Oral, QHS, Zhang, Ping T, MD, 10 mg at 09/10/24 2133   ondansetron  (ZOFRAN ) tablet 4 mg, 4 mg, Oral, Q6H PRN **OR** ondansetron  (ZOFRAN ) injection 4 mg, 4 mg, Intravenous, Q6H PRN, Laurita Manor T, MD   Oral care mouth rinse, 15 mL, Mouth Rinse, 4 times per day, Laurita Manor T, MD, 15 mL at 09/11/24 1237   Oral care mouth rinse, 15 mL, Mouth Rinse, PRN, Laurita Manor T, MD   pantoprazole (PROTONIX) EC tablet 40 mg, 40 mg, Oral, Daily, Zhang, Ping T, MD, 40 mg at 09/11/24 9192   polyethylene glycol (MIRALAX / GLYCOLAX) packet 17 g, 17 g, Oral, Daily PRN, Trudy Anthony HERO, MD   QUEtiapine (SEROQUEL) tablet 25 mg, 25 mg, Oral, QHS, Zhang, Ping T, MD, 25 mg at 09/10/24 2133   tiZANidine  (ZANAFLEX ) tablet 2 mg, 2 mg, Oral, Q8H PRN, Laurita Manor DASEN, MD    ALLERGIES   Patient has no known allergies.     REVIEW OF SYSTEMS    Review of Systems:  Gen:  Denies  fever, sweats, chills weigh loss  HEENT: Denies blurred vision, double vision, ear pain, eye pain, hearing loss, nose bleeds, sore throat Cardiac:  No dizziness, chest pain or heaviness, chest tightness,edema Resp:   reports dyspnea chronically  Gi: Denies swallowing difficulty, stomach pain, nausea or vomiting, diarrhea, constipation, bowel incontinence Gu:  Denies bladder incontinence, burning urine Ext:   Denies Joint pain, stiffness or swelling Skin: Denies  skin rash, easy bruising or bleeding or hives Endoc:  Denies polyuria, polydipsia , polyphagia or weight change Psych:   Denies depression, insomnia or hallucinations   Other:  All other systems negative   VS: BP 105/88 (BP Location: Left  Arm)   Pulse (!) 138   Temp 98.3 F (36.8 C)   Resp 16   Ht 5' 4 (1.626 m)   Wt 54.6 kg   SpO2 93%   BMI 20.66 kg/m      PHYSICAL EXAM    GENERAL:NAD, no fevers, chills, no weakness no fatigue HEAD: Normocephalic, atraumatic.  EYES: Pupils equal, round, reactive to light. Extraocular muscles intact. No scleral icterus.  MOUTH: Moist mucosal membrane. Dentition intact. No abscess noted.  EAR, NOSE, THROAT: Clear without exudates. No external lesions.  NECK: Supple. No thyromegaly. No nodules. No JVD.  PULMONARY: decreased breath sounds with mild rhonchi worse  at bases bilaterally.  CARDIOVASCULAR: S1 and S2. Regular rate and rhythm. No murmurs, rubs, or gallops. No edema. Pedal pulses 2+ bilaterally.  GASTROINTESTINAL: Soft, nontender, nondistended. No masses. Positive bowel sounds. No hepatosplenomegaly.  MUSCULOSKELETAL: No swelling, clubbing, or edema. Range of motion full in all extremities.  NEUROLOGIC: Cranial nerves II through XII are intact. No gross focal neurological deficits. Sensation intact. Reflexes intact.  SKIN: No ulceration, lesions, rashes, or cyanosis. Skin warm and dry. Turgor intact.  PSYCHIATRIC: Mood, affect within normal limits. The patient is awake, alert and oriented x 3. Insight, judgment intact.       IMAGING   @IMAGES @  Narrative & Impression EXAM: 1 VIEW(S) XRAY OF THE CHEST 09/09/2024 08:31:00 AM   COMPARISON: 07/18/2024   CLINICAL HISTORY: sob, copd. Table formatting from the original note was not included.; patient coming in from home SOB for 2 weeks. Patient coming in on c-pap via EMS. ; hx of asthma, copd,. inital o2 67% on 4L [place] that she wears at home. patient was blue and mottled with ac3essory muscle use. 5 mg allbuterol, 2mg  mag, 125mg  solumed. Lung sounds were absent for EMS   FINDINGS:   LUNGS AND PLEURA: Hyperinflation with emphysema. No focal pulmonary opacity. No pulmonary edema. No pleural effusion. No  pneumothorax.   HEART AND MEDIASTINUM: Aortic atherosclerosis. No acute abnormality of the cardiac and mediastinal silhouettes.   BONES AND SOFT TISSUES: No acute osseous abnormality.   IMPRESSION: 1. Hyperinflation with emphysema, consistent with COPD. 2. Aortic atherosclerosis.   Electronically signed by: Evalene Coho MD 09/09/2024 08:38 AM EDT RP Workstation: [id number]     ASSESSMENT/PLAN   Acute exacerbation of asthma and COPD overlap (ACOS) Continue Singulair  10 mg p.o. nightly - Continue Breztri  Aerosphere twice daily - Wean Solu-Medrol  40 twice daily to prednisone  taper - CRP trend today - Pulmonary rehab once at chronic stable state and consider inpatient rehab postdischarge - Absence of leukocytosis on admission and development of mild leukocytosis most likely due to glucocorticoid demargination Due to chest discomfort and history of diastolic CHF I would like to repeat her transthoracic echo.           Thank you for allowing me to participate in the care of this patient.   Patient/Family are satisfied with care plan and all questions have been answered.    Provider disclosure: Patient with at least one acute or chronic illness or injury that poses a threat to life or bodily function and is being managed actively during this encounter.  All of the below services have been performed independently by signing provider:  review of prior documentation from internal and or external health records.  Review of previous and current lab results.  Interview and comprehensive assessment during patient visit today. Review of current and previous chest radiographs/CT scans. Discussion of management and test interpretation with health care team and patient/family.   This document was prepared using Dragon voice recognition software and may include unintentional dictation errors.     Brian Zeitlin, M.D.  Division of Pulmonary & Critical Care Medicine

## 2024-09-11 NOTE — Plan of Care (Signed)

## 2024-09-11 NOTE — Progress Notes (Signed)
 Brief Assessment Note  Medical record reviewed and patient has no TOC needs at this time. Please outreach to [facility] if needs are identified.

## 2024-09-12 ENCOUNTER — Inpatient Hospital Stay (HOSPITAL_COMMUNITY)
Admit: 2024-09-12 | Discharge: 2024-09-12 | Disposition: A | Discharge status: Home / Self Care | Attending: Pulmonary Disease | Admitting: Pulmonary Disease

## 2024-09-12 ENCOUNTER — Inpatient Hospital Stay

## 2024-09-12 DIAGNOSIS — I5030 Unspecified diastolic (congestive) heart failure: Secondary | ICD-10-CM

## 2024-09-12 DIAGNOSIS — J441 Chronic obstructive pulmonary disease with (acute) exacerbation: Secondary | ICD-10-CM | POA: Diagnosis not present

## 2024-09-12 LAB — ECHOCARDIOGRAM COMPLETE BUBBLE STUDY
AR max vel: 2.68 cm2
AV Area VTI: 3.01 cm2
AV Area mean vel: 2.86 cm2
AV Mean grad: 2.5 mmHg
AV Peak grad: 4 mmHg
Ao pk vel: 1 m/s
Area-P 1/2: 7.66 cm2
S' Lateral: 2.1 cm

## 2024-09-12 LAB — BASIC METABOLIC PANEL WITH GFR
Anion gap: 8 (ref 5–15)
BUN: 25 mg/dL — ABNORMAL HIGH (ref 8–23)
CO2: 29 mmol/L (ref 22–32)
Calcium: 8.7 mg/dL — ABNORMAL LOW (ref 8.9–10.3)
Chloride: 100 mmol/L (ref 98–111)
Creatinine, Ser: 0.57 mg/dL (ref 0.44–1.00)
GFR, Estimated: >60 mL/min (ref >60)
Glucose, Bld: 127 mg/dL — ABNORMAL HIGH (ref 70–99)
Potassium: 4.5 mmol/L (ref 3.5–5.1)
Sodium: 137 mmol/L (ref 135–145)

## 2024-09-12 LAB — CBC
HCT: 37 % (ref 36.0–46.0)
Hemoglobin: 12.2 g/dL (ref 12.0–15.0)
MCH: 31.9 pg (ref 26.0–34.0)
MCHC: 33 g/dL (ref 30.0–36.0)
MCV: 96.9 fL (ref 80.0–100.0)
Platelets: 295 K/uL (ref 150–400)
RBC: 3.82 MIL/uL — ABNORMAL LOW (ref 3.87–5.11)
RDW: 12.1 % (ref 11.5–15.5)
WBC: 9.4 K/uL (ref 4.0–10.5)
nRBC: 0 % (ref 0.0–0.2)

## 2024-09-12 LAB — LEGIONELLA PNEUMOPHILA SEROGP 1 UR AG: L. pneumophila Serogp 1 Ur Ag: NEGATIVE

## 2024-09-12 LAB — MYCOPLASMA PNEUMONIAE ANTIBODY, IGM: Mycoplasma pneumo IgM: <770 U/mL (ref 0–769)

## 2024-09-12 MED ORDER — METHYLPREDNISOLONE SODIUM SUCC 40 MG IJ SOLR
40.0000 mg | Freq: Every day | INTRAMUSCULAR | Status: DC
  Administered 2024-09-13: 40 mg via INTRAVENOUS
  Filled 2024-09-12: qty 1

## 2024-09-12 MED ORDER — SALINE SPRAY 0.65 % NA SOLN
1.0000 | NASAL | Status: DC | PRN
  Administered 2024-09-12 – 2024-09-14 (×3): 1 via NASAL
  Filled 2024-09-12: qty 44

## 2024-09-12 NOTE — Progress Notes (Signed)
 PULMONOLOGY         Date: 09/12/2024,   MRN# [id number] Brandi [name] [birth year]     AdmissionWeight: 54.6 kg                 CurrentWeight: 54.6 kg  Referring provider: Dr Trudy    CHIEF COMPLAINT:   Acute exacerbation of Asthma and COPD overlap   HISTORY OF PRESENT ILLNESS   This is a 68 year old female with a history of asthma and COPD overlap syndrome with chronic hypoxemic respiratory failure utilizing 4 L/min at rest and anxiety disorder.  She also has a background of dyslipidemia.  She has been on inhaler therapy for COPD and asthma as well as recent introduction of prednisone  at 5 mg/day with Bactrim  thrice weekly.  She felt initially improved but reports approximately 1 week ago onset of chest discomfort with worsening hypoxemia.  On admission she had a viral workup done and is negative for 20 species respiratory viral panel as well as negative for RSV/COVID/flu.  She is being treated for acute exacerbation of asthma and COPD with Solu-Medrol  40 mg as well as nebulizer therapy and her home Breztri  twice daily she also has rhinitis and is taking Singulair  for this which has been continued.  09/12/24-  patient is improved today.  She is closes to baseline.  Her wheezing is almost completely resolved although she shares there was a bad spell this morning.  She is on solumedrol 40 IV bid and I have reduced this to once daily now.  She is on Breztri  BID, Duoneb and albuterol . She is on doxycycline  100 bid with a course to be completed for 7d course.    PFT  ponent Ref Range & Units (hover) 5 mo ago 1 yr ago  FVC-Pre 1.91 2.44  FVC-%Pred-Pre 62 78  FVC-Post 2.35 2.67  FVC-%Pred-Post 76 86  FVC-%Change-Post 22 9  FEV1-Pre 0.75 1.00  FEV1-%Pred-Pre 32 42  FEV1-Post 1.04 1.18  FEV1-%Pred-Post 44 50  FEV1-%Change-Post 38 17  FEV6-Pre 1.72 2.19  FEV6-%Pred-Pre 58 73  FEV6-Post 2.21 2.50  FEV6-%Pred-Post 75 84  FEV6-%Change-Post 28 13  Pre FEV1/FVC ratio 39  41  FEV1FVC-%Pred-Pre 51 53  Post FEV1/FVC ratio 44 44  FEV1FVC-%Change-Post 13 7  Pre FEV6/FVC Ratio 90 90  FEV6FVC-%Pred-Pre 93 93  Post FEV6/FVC ratio 94 94  FEV6FVC-%Pred-Post 98 97  FEV6FVC-%Change-Post 4 4  FEF 25-75 Pre 0.26 0.37  FEF2575-%Pred-Pre 13 18  FEF 25-75 Post 0.57 0.54  FEF2575-%Pred-Post 28 26  FEF2575-%Change-Post 115 45  RV 4.41 3.69  RV % pred 205 173  TLC 6.98 6.30  TLC % pred 138 124  DLCO unc 9.04 6.69  DLCO unc % pred 46 34  DL/VA 7.77 8.44  DL/VA % pred 53 37  Resulting Agency [facility] [employer]        Specimen Collected: 04/05/24 13:56 Last Resulted: 04/05/24 14:41        PAST MEDICAL HISTORY   Past Medical History:  Diagnosis Date  . Anxiety   . COPD (chronic obstructive pulmonary disease) (HCC)   . Hyperlipidemia      SURGICAL HISTORY   Past Surgical History:  Procedure Laterality Date  . APPENDECTOMY    . CHOLECYSTECTOMY    . SHOULDER ARTHROSCOPY Left 07/02/2015   Procedure: ,left shoulder arthroscopy, decompression and debridement;  Surgeon: Norleen JINNY Maltos, MD;  Location: [facility];  Service: Orthopedics;  Laterality: Left;  . SHOULDER CLOSED REDUCTION Left 04/09/2015   Procedure: CLOSED MANIPULATION  SHOULDER;  Surgeon: Ozell Flake, MD;  Location: [facility] OR[facility];  Service: Orthopedics;  Laterality: Left;  . TUBAL LIGATION       FAMILY HISTORY   Family History  Problem Relation Age of Onset  . Breast cancer Mother 31  . Alzheimer's disease Mother      SOCIAL HISTORY   Social History   Tobacco Use  . Smoking status: Former    Current packs/day: 0.00    Average packs/day: 0.8 packs/day for 41.0 years (30.8 ttl pk-yrs)    Types: Cigarettes    Start date: 12/07/1974    Quit date: 12/08/2015    Years since quitting: 8.7  . Smokeless tobacco: Never  Vaping Use  . Vaping status: Never Used  Substance Use Topics  . Alcohol use: No  . Drug use: No     MEDICATIONS    Home Medication:    Current  Medication:  Current Facility-Administered Medications:  .  albuterol  (PROVENTIL ) (2.5 MG/3ML) 0.083% nebulizer solution 3 mL, 3 mL, Inhalation, Q4H PRN, Laurita Manor T, MD, 3 mL at 09/11/24 1232 .  aspirin  EC tablet 81 mg, 81 mg, Oral, Daily, Laurita Manor T, MD, 81 mg at 09/11/24 0807 .  benzonatate (TESSALON) capsule 100 mg, 100 mg, Oral, TID PRN, Zhang, Ping T, MD .  budesonide-glycopyrrolate -formoterol (BREZTRI ) 160-9-4.8 MCG/ACT inhaler 2 puff, 2 puff, Inhalation, BID, Laurita Manor T, MD, 2 puff at 09/11/24 2135 .  busPIRone  (BUSPAR ) tablet 7.5 mg, 7.5 mg, Oral, BID PRN, Laurita Manor T, MD, 7.5 mg at 09/11/24 2134 .  cyanocobalamin  (VITAMIN B12) injection 1,000 mcg, 1,000 mcg, Intramuscular, Once, Laurita Manor T, MD .  docusate sodium (COLACE) capsule 200 mg, 200 mg, Oral, BID, Trudy Anthony HERO, MD, 200 mg at 09/11/24 1005 .  doxycycline  (VIBRA -TABS) tablet 100 mg, 100 mg, Oral, Q12H, Laurita Manor T, MD, 100 mg at 09/11/24 2137 .  enoxaparin  (LOVENOX ) injection 40 mg, 40 mg, Subcutaneous, Q24H, Zhang, Ping T, MD, 40 mg at 09/11/24 2133 .  ezetimibe  (ZETIA ) tablet 10 mg, 10 mg, Oral, Daily, Laurita Manor T, MD, 10 mg at 09/11/24 0806 .  famotidine  (PEPCID ) tablet 20 mg, 20 mg, Oral, BID PRN, Zhang, Ping T, MD .  feeding supplement (ENSURE ENLIVE / ENSURE PLUS) liquid 237 mL, 237 mL, Oral, BID BM, Laurita Manor T, MD, 237 mL at 09/11/24 0811 .  ipratropium-albuterol  (DUONEB) 0.5-2.5 (3) MG/3ML nebulizer solution 3 mL, 3 mL, Nebulization, Q6H, Laurita Manor T, MD, 3 mL at 09/12/24 0743 .  methylPREDNISolone  sodium succinate (SOLU-MEDROL ) 40 mg/mL injection 40 mg, 40 mg, Intravenous, Q12H, Trudy Anthony HERO, MD, 40 mg at 09/12/24 0743 .  metoprolol tartrate (LOPRESSOR) injection 5 mg, 5 mg, Intravenous, Q6H PRN, Trudy Anthony HERO, MD .  montelukast  (SINGULAIR ) tablet 10 mg, 10 mg, Oral, QHS, Zhang, Ping T, MD, 10 mg at 09/11/24 2134 .  ondansetron  (ZOFRAN ) tablet 4 mg, 4 mg, Oral, Q6H PRN **OR**  ondansetron  (ZOFRAN ) injection 4 mg, 4 mg, Intravenous, Q6H PRN, Laurita Manor T, MD .  Oral care mouth rinse, 15 mL, Mouth Rinse, 4 times per day, Laurita Manor T, MD, 15 mL at 09/12/24 0743 .  Oral care mouth rinse, 15 mL, Mouth Rinse, PRN, Laurita Manor T, MD .  pantoprazole (PROTONIX) EC tablet 40 mg, 40 mg, Oral, Daily, Zhang, Ping T, MD, 40 mg at 09/11/24 0807 .  polyethylene glycol (MIRALAX / GLYCOLAX) packet 17 g, 17 g, Oral, Daily PRN, Trudy Anthony HERO, MD .  QUEtiapine (SEROQUEL) tablet  25 mg, 25 mg, Oral, QHS, Zhang, Ping T, MD, 25 mg at 09/11/24 2134 .  tiZANidine  (ZANAFLEX ) tablet 2 mg, 2 mg, Oral, Q8H PRN, Laurita Cort DASEN, MD    ALLERGIES   Patient has no known allergies.     REVIEW OF SYSTEMS    Review of Systems:  Gen:  Denies  fever, sweats, chills weigh loss  HEENT: Denies blurred vision, double vision, ear pain, eye pain, hearing loss, nose bleeds, sore throat Cardiac:  No dizziness, chest pain or heaviness, chest tightness,edema Resp:   reports dyspnea chronically  Gi: Denies swallowing difficulty, stomach pain, nausea or vomiting, diarrhea, constipation, bowel incontinence Gu:  Denies bladder incontinence, burning urine Ext:   Denies Joint pain, stiffness or swelling Skin: Denies  skin rash, easy bruising or bleeding or hives Endoc:  Denies polyuria, polydipsia , polyphagia or weight change Psych:   Denies depression, insomnia or hallucinations   Other:  All other systems negative   VS: BP 124/78 (BP Location: Left Arm)   Pulse 97   Temp 97.7 F (36.5 C) (Oral)   Resp 20   Ht 5' 4 (1.626 m)   Wt 54.6 kg   SpO2 97%   BMI 20.66 kg/m      PHYSICAL EXAM    GENERAL:NAD, no fevers, chills, no weakness no fatigue HEAD: Normocephalic, atraumatic.  EYES: Pupils equal, round, reactive to light. Extraocular muscles intact. No scleral icterus.  MOUTH: Moist mucosal membrane. Dentition intact. No abscess noted.  EAR, NOSE, THROAT: Clear without exudates. No  external lesions.  NECK: Supple. No thyromegaly. No nodules. No JVD.  PULMONARY: wheezing is reduced and improved.  CARDIOVASCULAR: S1 and S2. Regular rate and rhythm. No murmurs, rubs, or gallops. No edema. Pedal pulses 2+ bilaterally.  GASTROINTESTINAL: Soft, nontender, nondistended. No masses. Positive bowel sounds. No hepatosplenomegaly.  MUSCULOSKELETAL: No swelling, clubbing, or edema. Range of motion full in all extremities.  NEUROLOGIC: Cranial nerves II through XII are intact. No gross focal neurological deficits. Sensation intact. Reflexes intact.  SKIN: No ulceration, lesions, rashes, or cyanosis. Skin warm and dry. Turgor intact.  PSYCHIATRIC: Mood, affect within normal limits. The patient is awake, alert and oriented x 3. Insight, judgment intact.       IMAGING   @IMAGES @  Narrative & Impression EXAM: 1 VIEW(S) XRAY OF THE CHEST 09/09/2024 08:31:00 AM   COMPARISON: 07/18/2024   CLINICAL HISTORY: sob, copd. Table formatting from the original note was not included.; patient coming in from home SOB for 2 weeks. Patient coming in on c-pap via EMS. ; hx of asthma, copd,. inital o2 67% on 4L [place] that she wears at home. patient was blue and mottled with ac3essory muscle use. 5 mg allbuterol, 2mg  mag, 125mg  solumed. Lung sounds were absent for EMS   FINDINGS:   LUNGS AND PLEURA: Hyperinflation with emphysema. No focal pulmonary opacity. No pulmonary edema. No pleural effusion. No pneumothorax.   HEART AND MEDIASTINUM: Aortic atherosclerosis. No acute abnormality of the cardiac and mediastinal silhouettes.   BONES AND SOFT TISSUES: No acute osseous abnormality.   IMPRESSION: 1. Hyperinflation with emphysema, consistent with COPD. 2. Aortic atherosclerosis.   Electronically signed by: Evalene Coho MD 09/09/2024 08:38 AM EDT RP Workstation: [id number]     ASSESSMENT/PLAN   Acute exacerbation of asthma and COPD overlap (ACOS) Continue Singulair  10 mg p.o.  nightly - Continue Breztri  Aerosphere twice daily - Wean Solu-Medrol  40 twice daily now to once daily  - CRP trend today - Pulmonary rehab  once at chronic stable state and consider inpatient rehab postdischarge - s/p TTE      Thank you for allowing me to participate in the care of this patient.   Patient/Family are satisfied with care plan and all questions have been answered.    Provider disclosure: Patient with at least one acute or chronic illness or injury that poses a threat to life or bodily function and is being managed actively during this encounter.  All of the below services have been performed independently by signing provider:  review of prior documentation from internal and or external health records.  Review of previous and current lab results.  Interview and comprehensive assessment during patient visit today. Review of current and previous chest radiographs/CT scans. Discussion of management and test interpretation with health care team and patient/family.   This document was prepared using Dragon voice recognition software and may include unintentional dictation errors.     Brandi [name], M.D.  Division of Pulmonary & Critical Care Medicine

## 2024-09-12 NOTE — Progress Notes (Signed)
 Pt is off Bi-pap, requesting to be back on  [place].

## 2024-09-12 NOTE — Progress Notes (Signed)
 PROGRESS NOTE   HPI was taken from Dr. Laurita: Brandi [name] is a 68 y.o. 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   Symptoms started about 2 weeks ago, patient started to have dry cough and wheezing and increasing shortness of breath.  No chest pain no fever or chills.  Went to see PCP about 1 week ago, was diagnosed with bronchitis and started Bactrim  and despite, patient continued to have worsening of cough wheezing and shortness of breath.  Called EMS this morning, and EMS arrived at her home and found patient O2 sat in the 60% and cyanotic, and patient was placed on CPAP.  Patient was also given albuterol  2 mg magnesium and 125 mg of Solu-Medrol  in the field.   ED Course: Afebrile, borderline tachycardia blood pressure 108/77, saturation 100% on BiPAP with 30% of FiO2.  Chest x-ray negative for acute infiltrates, VBG showed 7.20 6/70/34, WBC 10.3 hemoglobin 13.3 bicarb 27 BUN 15 creatinine 0.5.   Patient was given Solu-Medrol  but remained wheezy    Ailed Defibaugh  FMW:[id number] DOB: 04-26-1956 DOA: 09/09/2024 PCP: Edman [name], DO   Assessment & Plan:   Principal Problem:   COPD exacerbation (HCC) Active Problems:   COPD with acute exacerbation (HCC)  Assessment and Plan: Acute on chronic hypoxic respiratory failure: continue on supplemental oxygen , on baseline oxygen  of 4L [place]. Continue on steroids, bronchodilators & encourage incentive spirometry. Echo ordered as per pulmon   Acute COPD exacerbation: continue on IV steroids, doxy, bronchodilators, & encourage incentive spirometry. Resp viral panel, influenza, COVID19 & RSV are all neg. Pt wore BiPAP for short period of time and then refused it on 09/10/24. Pulmon following and recs apprec  Asthma exacerbation: as per pulmon. Unknown stage  and/or severity. Continue on steroids, bronchodilators & encourage incentive spirometry. Pulmon following and recs apprec   Depression: severity unknown. Continue on home dose of buspar    GERD: continue on PPI  Moderate protein calorie malnutrition: continue on nutritional supplements       DVT prophylaxis: lovenox   Code Status: full  Family Communication: discussed pt's care w/ pt's daughter, [name], and answered her questions  Disposition Plan: likely d/c back home  Level of care: Progressive  Status is: Inpatient Remains inpatient appropriate because: severity of illness    Consultants:  pulmon  Procedures:   Antimicrobials: doxy  Subjective: Pt still c/o shortness of breath   Objective: Vitals:   09/11/24 2000 09/11/24 2319 09/12/24 0507 09/12/24 0741  BP:  126/75 103/62 124/78  Pulse:  (!) 102 99 97  Resp:  20 20 20   Temp:  (!) 97.3 F (36.3 C) 97.7 F (36.5 C) 97.7 F (36.5 C)  TempSrc:    Oral  SpO2: 99% 97% 97% 97%  Weight:      Height:        Intake/Output Summary (Last 24 hours) at 09/12/2024 0938 Last data filed at 09/11/2024 2200 Gross per 24 hour  Intake --  Output 200 ml  Net -200 ml   Filed Weights   09/09/24 0817  Weight: 54.6 kg    Examination:  General exam:appears uncomfortable. Frail appearing  Respiratory system: decreased breath sounds b/l. Intermittent wheezes b/l  Cardiovascular system: S1 & S2. No rubs or clicks  Gastrointestinal system: abd is soft, NT, ND & hypoactive bowel sounds Central nervous system: alert & awake.  Moves all extremities  Psychiatry: judgement and insight appears at baseline. Flat mood and affect    Data Reviewed: I have personally reviewed following labs and imaging studies  CBC: Recent Labs  Lab 09/09/24 0825 09/10/24 0416 09/11/24 0526 09/12/24 0421  WBC 10.3 8.3 11.9* 9.4  HGB 13.3 11.7* 12.1 12.2  HCT 41.5 35.4* 37.0 37.0  MCV 98.3 95.9 97.6 96.9  PLT 284 253 291 295   Basic  Metabolic Panel: Recent Labs  Lab 09/09/24 0825 09/11/24 0526 09/12/24 0421  NA 135 140 137  K 3.9 4.8 4.5  CL 94* 101 100  CO2 27 30 29   GLUCOSE 158* 127* 127*  BUN 15 29* 25*  CREATININE 0.59 0.55 0.57  CALCIUM  8.7* 9.0 8.7*   GFR: Estimated Creatinine Clearance: 58 mL/min (by C-G formula based on SCr of 0.57 mg/dL). Liver Function Tests: Recent Labs  Lab 09/09/24 0825  AST 23  ALT 19  ALKPHOS 36*  BILITOT 0.6  PROT 6.7  ALBUMIN 3.7   No results for input(s): LIPASE, AMYLASE in the last 168 hours. No results for input(s): AMMONIA in the last 168 hours. Coagulation Profile: No results for input(s): INR, PROTIME in the last 168 hours. Cardiac Enzymes: No results for input(s): CKTOTAL, CKMB, CKMBINDEX, TROPONINI in the last 168 hours. BNP (last 3 results) No results for input(s): PROBNP in the last 8760 hours. HbA1C: No results for input(s): HGBA1C in the last 72 hours. CBG: No results for input(s): GLUCAP in the last 168 hours. Lipid Profile: No results for input(s): CHOL, HDL, LDLCALC, TRIG, CHOLHDL, LDLDIRECT in the last 72 hours. Thyroid  Function Tests: No results for input(s): TSH, T4TOTAL, FREET4, T3FREE, THYROIDAB in the last 72 hours. Anemia Panel: No results for input(s): VITAMINB12, FOLATE, FERRITIN, TIBC, IRON, RETICCTPCT in the last 72 hours. Sepsis Labs: No results for input(s): PROCALCITON, LATICACIDVEN in the last 168 hours.  Recent Results (from the past 240 hours)  Resp panel by RT-PCR (RSV, Flu A&B, Covid) Anterior Nasal Swab     Status: None   Collection Time: 09/09/24  8:25 AM   Specimen: Anterior Nasal Swab  Result Value Ref Range Status   SARS Coronavirus 2 by RT PCR NEGATIVE NEGATIVE Final    Comment: (NOTE) SARS-CoV-2 target nucleic acids are NOT DETECTED.  The SARS-CoV-2 RNA is generally detectable in upper respiratory specimens during the acute phase of infection. The  lowest concentration of SARS-CoV-2 viral copies this assay can detect is 138 copies/mL. A negative result does not preclude SARS-Cov-2 infection and should not be used as the sole basis for treatment or other patient management decisions. A negative result may occur with  improper specimen collection/handling, submission of specimen other than nasopharyngeal swab, presence of viral mutation(s) within the areas targeted by this assay, and inadequate number of viral copies(<138 copies/mL). A negative result must be combined with clinical observations, patient history, and epidemiological information. The expected result is Negative.  Fact Sheet for Patients:  BloggerCourse.com  Fact Sheet for Healthcare Providers:  SeriousBroker.it  This test is no t yet approved or cleared by the [place]  FDA and  has been authorized for detection and/or diagnosis of SARS-CoV-2 by FDA under an Emergency Use Authorization (EUA). This EUA will remain  in effect (meaning this test can be used) for the duration of the COVID-19 declaration under Section 564(b)(1) of the Act, 21 U.S.C.section 360bbb-3(b)(1), unless the authorization is terminated  or revoked sooner.       Influenza A by PCR  NEGATIVE NEGATIVE Final   Influenza B by PCR NEGATIVE NEGATIVE Final    Comment: (NOTE) The Xpert Xpress SARS-CoV-2/FLU/RSV plus assay is intended as an aid in the diagnosis of influenza from Nasopharyngeal swab specimens and should not be used as a sole basis for treatment. Nasal washings and aspirates are unacceptable for Xpert Xpress SARS-CoV-2/FLU/RSV testing.  Fact Sheet for Patients: BloggerCourse.com  Fact Sheet for Healthcare Providers: SeriousBroker.it  This test is not yet approved or cleared by the [place]  FDA and has been authorized for detection and/or diagnosis of SARS-CoV-2 by FDA under  an Emergency Use Authorization (EUA). This EUA will remain in effect (meaning this test can be used) for the duration of the COVID-19 declaration under Section 564(b)(1) of the Act, 21 U.S.C. section 360bbb-3(b)(1), unless the authorization is terminated or revoked.     Resp Syncytial Virus by PCR NEGATIVE NEGATIVE Final    Comment: (NOTE) Fact Sheet for Patients: BloggerCourse.com  Fact Sheet for Healthcare Providers: SeriousBroker.it  This test is not yet approved or cleared by the [place]  FDA and has been authorized for detection and/or diagnosis of SARS-CoV-2 by FDA under an Emergency Use Authorization (EUA). This EUA will remain in effect (meaning this test can be used) for the duration of the COVID-19 declaration under Section 564(b)(1) of the Act, 21 U.S.C. section 360bbb-3(b)(1), unless the authorization is terminated or revoked.  Performed at [facility], [address] [place], [place] [address]   Respiratory (~20 pathogens) panel by PCR     Status: None   Collection Time: 09/09/24 10:39 AM   Specimen: Nasopharyngeal Swab; Respiratory  Result Value Ref Range Status   Adenovirus NOT DETECTED NOT DETECTED Final   Coronavirus 229E NOT DETECTED NOT DETECTED Final    Comment: (NOTE) The Coronavirus on the Respiratory Panel, DOES NOT test for the novel  Coronavirus (2019 nCoV)    Coronavirus HKU1 NOT DETECTED NOT DETECTED Final   Coronavirus NL63 NOT DETECTED NOT DETECTED Final   Coronavirus OC43 NOT DETECTED NOT DETECTED Final   Metapneumovirus NOT DETECTED NOT DETECTED Final   Rhinovirus / Enterovirus NOT DETECTED NOT DETECTED Final   Influenza A NOT DETECTED NOT DETECTED Final   Influenza B NOT DETECTED NOT DETECTED Final   Parainfluenza Virus 1 NOT DETECTED NOT DETECTED Final   Parainfluenza Virus 2 NOT DETECTED NOT DETECTED Final   Parainfluenza Virus 3 NOT DETECTED NOT DETECTED Final    Parainfluenza Virus 4 NOT DETECTED NOT DETECTED Final   Respiratory Syncytial Virus NOT DETECTED NOT DETECTED Final   Bordetella pertussis NOT DETECTED NOT DETECTED Final   Bordetella Parapertussis NOT DETECTED NOT DETECTED Final   Chlamydophila pneumoniae NOT DETECTED NOT DETECTED Final   Mycoplasma pneumoniae NOT DETECTED NOT DETECTED Final    Comment: Performed at [facility], [address] [place], [place] [address]  Expectorated Sputum Assessment w Gram Stain, Rflx to Resp Cult     Status: None   Collection Time: 09/09/24 11:05 PM   Specimen: Expectorated Sputum  Result Value Ref Range Status   Specimen Description EXPECTORATED SPUTUM  Final   Special Requests NONE  Final   Sputum evaluation   Final    Sputum specimen not acceptable for testing.  Please recollect.   Performed at [facility], [address] [place], [place] [address]    Report Status 09/10/2024 FINAL  Final         Radiology Studies: No results found.  Scheduled Meds:  aspirin  EC  81 mg Oral Daily   budesonide-glycopyrrolate -formoterol  2 puff Inhalation BID   cyanocobalamin   1,000 mcg Intramuscular Once   docusate sodium  200 mg Oral BID   doxycycline   100 mg Oral Q12H   enoxaparin  (LOVENOX ) injection  40 mg Subcutaneous Q24H   ezetimibe   10 mg Oral Daily   feeding supplement  237 mL Oral BID BM   ipratropium-albuterol   3 mL Nebulization Q6H   methylPREDNISolone  (SOLU-MEDROL ) injection  40 mg Intravenous Q12H   montelukast   10 mg Oral QHS   mouth rinse  15 mL Mouth Rinse 4 times per day   pantoprazole  40 mg Oral Daily   QUEtiapine  25 mg Oral QHS   Continuous Infusions:   LOS: 3 days      Anthony CHRISTELLA Pouch, MD [facility] Pager [phone]  If 7PM-7AM, please contact night-coverage [email or web address] 09/12/2024, 9:38 AM

## 2024-09-12 NOTE — Progress Notes (Signed)
 Occupational Therapy Treatment Patient Details Name: Brandi [name] MRN: [id number] DOB: 1956/06/26 Today's Date: 09/12/2024   History of present illness 68 y.o. female with medical history significant of COPD Gold stage III with chronic hypoxic respite failure on 3 L continuously and chronic steroid, HLD, anxiety/depression, presented with worsening cough wheezing shortness of breath x2wks.   OT comments  Pt seen for OT tx. Pt endorses feeling and breathing a bit better today. Visually appears much improved, able to converse without becoming SOB. HR in 110's seated in bed without back support, SpO2 98% on 4L. Pt educated extensively in ECS including activity pacing, work simplification, PLB, AE/DME, importance of rest breaks, use of pulse oximeter to support activity, and home/routines modifications for ADL and IADL to ensure pt safety, indepedence, and support improved activity tolerance gradually. Pt demo's questionable insight into implementing activity pacing requiring additional cues. Continues to benefit from skilled OT services both during and after hospitalization.       If plan is discharge home, recommend the following:  A little help with walking and/or transfers;Assistance with cooking/housework;Assist for transportation;Help with stairs or ramp for entrance;A little help with bathing/dressing/bathroom   Equipment Recommendations  BSC/3in1;Other (comment) (2WW)    Recommendations for Other Services      Precautions / Restrictions Precautions Precautions: Fall Precaution/Restrictions Comments: monitor HR/O2 Restrictions Weight Bearing Restrictions Per Provider Order: No       Mobility Bed Mobility                    Transfers                         Balance                                           ADL either performed or assessed with clinical judgement   ADL                                               Extremity/Trunk Assessment              Vision       Perception     Praxis     Communication Communication Communication: No apparent difficulties   Cognition Arousal: Alert Behavior During Therapy: WFL for tasks assessed/performed               OT - Cognition Comments: Pt demo's questionable judgment regarding safety and activity tolerance                 Following commands: Intact        Cueing   Cueing Techniques: Verbal cues  Exercises Other Exercises Other Exercises: Pt educated in ECS including activity pacing, work simplification, PLB, AE/DME, importance of rest breaks, use of pulse oximeter to support activity, and home/routines modifications for ADL and IADL to ensure pt safety, indepedence, and support improved activity tolerance gradually.    Shoulder Instructions       General Comments      Pertinent Vitals/ Pain       Pain Assessment Pain Assessment: No/denies pain  Home Living  Prior Functioning/Environment              Frequency  Min 2X/week        Progress Toward Goals  OT Goals(current goals can now be found in the care plan section)  Progress towards OT goals: Progressing toward goals  Acute Rehab OT Goals Patient Stated Goal: breathe better OT Goal Formulation: With patient Time For Goal Achievement: 09/25/24 Potential to Achieve Goals: Good  Plan      Co-evaluation                 AM-PAC OT 6 Clicks Daily Activity     Outcome Measure   Help from another person eating meals?: None Help from another person taking care of personal grooming?: None Help from another person toileting, which includes using toliet, bedpan, or urinal?: A Little Help from another person bathing (including washing, rinsing, drying)?: A Little Help from another person to put on and taking off regular upper body clothing?: None Help from another person to put on  and taking off regular lower body clothing?: A Little 6 Click Score: 21    End of Session Equipment Utilized During Treatment: Oxygen   OT Visit Diagnosis: Unsteadiness on feet (R26.81);Other abnormalities of gait and mobility (R26.89);Muscle weakness (generalized) (M62.81)   Activity Tolerance Patient tolerated treatment well   Patient Left in bed;with call bell/phone within reach;with bed alarm set   Nurse Communication          Time: 8394-8366 OT Time Calculation (min): 28 min  Charges: OT General Charges $OT Visit: 1 Visit OT Treatments $Self Care/Home Management : 23-37 mins  Warren SAUNDERS., MPH, MS, OTR/L ascom [phone] 09/12/24, 4:53 PM

## 2024-09-12 NOTE — Plan of Care (Signed)

## 2024-09-12 NOTE — Progress Notes (Signed)
*  PRELIMINARY RESULTS* Echocardiogram 2D Echocardiogram has been performed.  Brandi [name] 09/12/2024, 2:50 PM

## 2024-09-13 ENCOUNTER — Ambulatory Visit

## 2024-09-13 DIAGNOSIS — J441 Chronic obstructive pulmonary disease with (acute) exacerbation: Secondary | ICD-10-CM | POA: Diagnosis not present

## 2024-09-13 LAB — CBC
HCT: 36.1 % (ref 36.0–46.0)
Hemoglobin: 11.8 g/dL — ABNORMAL LOW (ref 12.0–15.0)
MCH: 32.3 pg (ref 26.0–34.0)
MCHC: 32.7 g/dL (ref 30.0–36.0)
MCV: 98.9 fL (ref 80.0–100.0)
Platelets: 295 K/uL (ref 150–400)
RBC: 3.65 MIL/uL — ABNORMAL LOW (ref 3.87–5.11)
RDW: 11.9 % (ref 11.5–15.5)
WBC: 10.3 K/uL (ref 4.0–10.5)
nRBC: 0 % (ref 0.0–0.2)

## 2024-09-13 LAB — BASIC METABOLIC PANEL WITH GFR
Anion gap: 9 (ref 5–15)
BUN: 26 mg/dL — ABNORMAL HIGH (ref 8–23)
CO2: 30 mmol/L (ref 22–32)
Calcium: 8.5 mg/dL — ABNORMAL LOW (ref 8.9–10.3)
Chloride: 99 mmol/L (ref 98–111)
Creatinine, Ser: 0.63 mg/dL (ref 0.44–1.00)
GFR, Estimated: >60 mL/min (ref >60)
Glucose, Bld: 90 mg/dL (ref 70–99)
Potassium: 4 mmol/L (ref 3.5–5.1)
Sodium: 138 mmol/L (ref 135–145)

## 2024-09-13 MED ORDER — PREDNISONE 10 MG PO TABS: 5.0000 mg | ORAL_TABLET | Freq: Every day | ORAL | Status: DC

## 2024-09-13 MED ORDER — PREDNISONE 20 MG PO TABS: 30.0000 mg | ORAL_TABLET | Freq: Every day | ORAL | Status: DC

## 2024-09-13 MED ORDER — IPRATROPIUM-ALBUTEROL 0.5-2.5 (3) MG/3ML IN SOLN
3.0000 mL | Freq: Three times a day (TID) | RESPIRATORY_TRACT | Status: DC
  Administered 2024-09-13 – 2024-09-15 (×5): 3 mL via RESPIRATORY_TRACT
  Filled 2024-09-13 (×6): qty 3

## 2024-09-13 MED ORDER — PREDNISONE 20 MG PO TABS: 40.0000 mg | ORAL_TABLET | Freq: Every day | ORAL | Status: DC

## 2024-09-13 MED ORDER — PREDNISONE 50 MG PO TABS: 50.0000 mg | ORAL_TABLET | Freq: Every day | ORAL | Status: DC

## 2024-09-13 MED ORDER — PREDNISONE 20 MG PO TABS
20.0000 mg | ORAL_TABLET | Freq: Every day | ORAL | Status: DC
Start: 2024-09-20 — End: 2024-09-21

## 2024-09-13 MED ORDER — PREDNISONE 50 MG PO TABS: 35.0000 mg | ORAL_TABLET | Freq: Every day | ORAL | Status: DC

## 2024-09-13 MED ORDER — MELATONIN 5 MG PO TABS
5.0000 mg | ORAL_TABLET | Freq: Every day | ORAL | Status: DC
  Administered 2024-09-14: 5 mg via ORAL
  Filled 2024-09-13: qty 1

## 2024-09-13 MED ORDER — PREDNISONE 50 MG PO TABS
50.0000 mg | ORAL_TABLET | Freq: Every day | ORAL | Status: AC
  Administered 2024-09-14: 50 mg via ORAL
  Filled 2024-09-13: qty 1

## 2024-09-13 MED ORDER — PREDNISONE 50 MG PO TABS: 25.0000 mg | ORAL_TABLET | Freq: Every day | ORAL | Status: DC

## 2024-09-13 MED ORDER — PREDNISONE 50 MG PO TABS
45.0000 mg | ORAL_TABLET | Freq: Every day | ORAL | Status: AC
  Administered 2024-09-15: 45 mg via ORAL
  Filled 2024-09-13: qty 1

## 2024-09-13 MED ORDER — PREDNISONE 10 MG PO TABS: 10.0000 mg | ORAL_TABLET | Freq: Every day | ORAL | Status: DC

## 2024-09-13 MED ORDER — PREDNISONE 10 MG PO TABS: 15.0000 mg | ORAL_TABLET | Freq: Every day | ORAL | Status: DC

## 2024-09-13 NOTE — TOC PASRR Note (Signed)
 RE: Brandi [name] Date of Birth: [birth year] Date: 09/13/2024   To Whom It May Concern:  Please be advised that the above-named patient will require a short-term nursing home stay - anticipated 30 days or less for rehabilitation and strengthening.  The plan is for return home.

## 2024-09-13 NOTE — NC FL2 (Signed)
 [place]  MEDICAID FL2 LEVEL OF CARE FORM     IDENTIFICATION  Patient Name: Brandi [name] Birthdate: December 23, 1955 Sex: female Admission Date (Current Location): 09/09/2024  [facility] and [place] Number:  Chiropodist and Address:  [facility], [address], [place], [place] [address]      Provider Number: [id number]  Attending Physician Name and Address:  Lue Elsie BROCKS, MD  Relative Name and Phone Number:       Current Level of Care: Hospital Recommended Level of Care: Skilled Nursing Facility Prior Approval Number:    Date Approved/Denied:   PASRR Number: Manual review  Discharge Plan: SNF    Current Diagnoses: Patient Active Problem List   Diagnosis Date Noted   COPD with acute exacerbation (HCC) 09/09/2024   COPD exacerbation (HCC) 09/09/2024   Generalized anxiety disorder with panic attacks 09/01/2024   Osteoporosis 11/29/2023   Rash and nonspecific skin eruption 05/22/2023   Facial tingling sensation 05/22/2023   Pre-diabetes 04/08/2021   Vitamin D  deficiency 04/08/2021   COVID-19 vaccination declined 12/04/2020   Centrilobular emphysema (HCC) 04/08/2020   Nodule of upper lobe of left lung 04/08/2020   Aortic atherosclerosis 05/21/2019   Coronary artery calcification seen on CT scan 05/21/2019   Former smoker 05/21/2019   Hyperlipidemia 05/21/2019    Orientation RESPIRATION BLADDER Height & Weight     Self, Time, Situation, Place  O2, Other (Comment) (Nasal Cannula 3 L. Bipap 12/5 at 30%,) Continent Weight: 120 lb 5.9 oz (54.6 kg) Height:  5' 4 (162.6 cm)  BEHAVIORAL SYMPTOMS/MOOD NEUROLOGICAL BOWEL NUTRITION STATUS  Other (Comment) (Anxiety)  (None) Continent Diet (Heart healthy)  AMBULATORY STATUS COMMUNICATION OF NEEDS Skin   Supervision Verbally Other (Comment) (Erythema/redness.)                       Personal Care Assistance Level of Assistance  Bathing, Feeding, Dressing Bathing Assistance: Limited  assistance Feeding assistance: Limited assistance Dressing Assistance: Limited assistance     Functional Limitations Info  Sight, Hearing, Speech Sight Info: Adequate Hearing Info: Adequate Speech Info: Adequate    SPECIAL CARE FACTORS FREQUENCY  PT (By licensed PT), OT (By licensed OT)     PT Frequency: 5 x week OT Frequency: 5 x week            Contractures Contractures Info: Not present    Additional Factors Info  Code Status, Allergies Code Status Info: Full code Allergies Info: NKDA           Current Medications (09/13/2024):  This is the current hospital active medication list Current Facility-Administered Medications  Medication Dose Route Frequency Provider Last Rate Last Admin   albuterol  (PROVENTIL ) (2.5 MG/3ML) 0.083% nebulizer solution 3 mL  3 mL Inhalation Q4H PRN Laurita Manor T, MD   3 mL at 09/13/24 1148   aspirin  EC tablet 81 mg  81 mg Oral Daily Laurita Manor T, MD   81 mg at 09/13/24 0901   benzonatate (TESSALON) capsule 100 mg  100 mg Oral TID PRN Laurita Manor DASEN, MD       budesonide-glycopyrrolate -formoterol (BREZTRI ) 160-9-4.8 MCG/ACT inhaler 2 puff  2 puff Inhalation BID Laurita Manor T, MD   2 puff at 09/13/24 0902   busPIRone  (BUSPAR ) tablet 7.5 mg  7.5 mg Oral BID PRN Laurita Manor T, MD   7.5 mg at 09/13/24 0901   docusate sodium (COLACE) capsule 200 mg  200 mg Oral BID Trudy Anthony HERO, MD  200 mg at 09/13/24 0900   doxycycline  (VIBRA -TABS) tablet 100 mg  100 mg Oral Q12H Laurita Manor T, MD   100 mg at 09/13/24 0859   enoxaparin  (LOVENOX ) injection 40 mg  40 mg Subcutaneous Q24H Laurita Manor T, MD   40 mg at 09/12/24 2100   ezetimibe  (ZETIA ) tablet 10 mg  10 mg Oral Daily Zhang, Ping T, MD   10 mg at 09/13/24 0900   famotidine  (PEPCID ) tablet 20 mg  20 mg Oral BID PRN Laurita Manor DASEN, MD       feeding supplement (ENSURE ENLIVE / ENSURE PLUS) liquid 237 mL  237 mL Oral BID BM Laurita Manor T, MD   237 mL at 09/13/24 1020   ipratropium-albuterol  (DUONEB)  0.5-2.5 (3) MG/3ML nebulizer solution 3 mL  3 mL Nebulization TID Parris Manna, MD       methylPREDNISolone  sodium succinate (SOLU-MEDROL ) 40 mg/mL injection 40 mg  40 mg Intravenous Daily Aleskerov, Fuad, MD   40 mg at 09/13/24 0859   metoprolol tartrate (LOPRESSOR) injection 5 mg  5 mg Intravenous Q6H PRN Trudy Anthony HERO, MD       montelukast  (SINGULAIR ) tablet 10 mg  10 mg Oral QHS Laurita Manor T, MD   10 mg at 09/12/24 2100   ondansetron  (ZOFRAN ) tablet 4 mg  4 mg Oral Q6H PRN Laurita Manor DASEN, MD       Or   ondansetron  (ZOFRAN ) injection 4 mg  4 mg Intravenous Q6H PRN Laurita Manor DASEN, MD       Oral care mouth rinse  15 mL Mouth Rinse 4 times per day Laurita Manor T, MD   15 mL at 09/13/24 0902   Oral care mouth rinse  15 mL Mouth Rinse PRN Laurita Manor T, MD       pantoprazole (PROTONIX) EC tablet 40 mg  40 mg Oral Daily Laurita Manor T, MD   40 mg at 09/13/24 0900   polyethylene glycol (MIRALAX / GLYCOLAX) packet 17 g  17 g Oral Daily PRN Williams, Jamiese M, MD       QUEtiapine (SEROQUEL) tablet 25 mg  25 mg Oral QHS Laurita Manor T, MD   25 mg at 09/12/24 2100   sodium chloride  (OCEAN) 0.65 % nasal spray 1 spray  1 spray Each Nare PRN Trudy Anthony HERO, MD   1 spray at 09/12/24 1305   tiZANidine  (ZANAFLEX ) tablet 2 mg  2 mg Oral Q8H PRN Laurita Manor DASEN, MD         Discharge Medications: Please see discharge summary for a list of discharge medications.  Relevant Imaging Results:  Relevant Lab Results:   Additional Information SS#: [id number]  Lauraine JAYSON Carpen, LCSW

## 2024-09-13 NOTE — Progress Notes (Signed)
 PULMONOLOGY         Date: 09/13/2024,   MRN# [id number] Brandi [name] 1956-07-29     AdmissionWeight: 54.6 kg                 CurrentWeight: 54.6 kg  Referring provider: Dr Trudy    CHIEF COMPLAINT:   Acute exacerbation of Asthma and COPD overlap   HISTORY OF PRESENT ILLNESS   This is a 68 year old female with a history of asthma and COPD overlap syndrome with chronic hypoxemic respiratory failure utilizing 4 L/min at rest and anxiety disorder.  She also has a background of dyslipidemia.  She has been on inhaler therapy for COPD and asthma as well as recent introduction of prednisone  at 5 mg/day with Bactrim  thrice weekly.  She felt initially improved but reports approximately 1 week ago onset of chest discomfort with worsening hypoxemia.  On admission she had a viral workup done and is negative for 20 species respiratory viral panel as well as negative for RSV/COVID/flu.  She is being treated for acute exacerbation of asthma and COPD with Solu-Medrol  40 mg as well as nebulizer therapy and her home Breztri  twice daily she also has rhinitis and is taking Singulair  for this which has been continued.  09/12/24-  patient is improved today.  She is closes to baseline.  Her wheezing is almost completely resolved although she shares there was a bad spell this morning.  She is on solumedrol 40 IV bid and I have reduced this to once daily now.  She is on Breztri  BID, Duoneb and albuterol . She is on doxycycline  100 bid with a course to be completed for 7d course.   09/13/24- patient is close to baseline but overall her condition is advanced with Stage 4COPD and BODE score is >9 indicative of poor prognosis.  She does have some reversible atelectatic changes on CXR so we will initiate metaneb therapy today to recruit basilar atelectasis and help her oxygenate better.     PFT  ponent Ref Range & Units (hover) 5 mo ago 1 yr ago  FVC-Pre 1.91 2.44  FVC-%Pred-Pre 62 78  FVC-Post  2.35 2.67  FVC-%Pred-Post 76 86  FVC-%Change-Post 22 9  FEV1-Pre 0.75 1.00  FEV1-%Pred-Pre 32 42  FEV1-Post 1.04 1.18  FEV1-%Pred-Post 44 50  FEV1-%Change-Post 38 17  FEV6-Pre 1.72 2.19  FEV6-%Pred-Pre 58 73  FEV6-Post 2.21 2.50  FEV6-%Pred-Post 75 84  FEV6-%Change-Post 28 13  Pre FEV1/FVC ratio 39 41  FEV1FVC-%Pred-Pre 51 53  Post FEV1/FVC ratio 44 44  FEV1FVC-%Change-Post 13 7  Pre FEV6/FVC Ratio 90 90  FEV6FVC-%Pred-Pre 93 93  Post FEV6/FVC ratio 94 94  FEV6FVC-%Pred-Post 98 97  FEV6FVC-%Change-Post 4 4  FEF 25-75 Pre 0.26 0.37  FEF2575-%Pred-Pre 13 18  FEF 25-75 Post 0.57 0.54  FEF2575-%Pred-Post 28 26  FEF2575-%Change-Post 115 45  RV 4.41 3.69  RV % pred 205 173  TLC 6.98 6.30  TLC % pred 138 124  DLCO unc 9.04 6.69  DLCO unc % pred 46 34  DL/VA 7.77 8.44  DL/VA % pred 53 37  Resulting Agency [facility]        Specimen Collected: 04/05/24 13:56 Last Resulted: 04/05/24 14:41        PAST MEDICAL HISTORY   Past Medical History:  Diagnosis Date   Anxiety    COPD (chronic obstructive pulmonary disease) (HCC)    Hyperlipidemia      SURGICAL HISTORY   Past Surgical History:  Procedure Laterality Date  APPENDECTOMY     CHOLECYSTECTOMY     SHOULDER ARTHROSCOPY Left 07/02/2015   Procedure: ,left shoulder arthroscopy, decompression and debridement;  Surgeon: Norleen JINNY Maltos, MD;  Location: [facility] OR[facility];  Service: Orthopedics;  Laterality: Left;   SHOULDER CLOSED REDUCTION Left 04/09/2015   Procedure: CLOSED MANIPULATION SHOULDER;  Surgeon: Ozell Flake, MD;  Location: [facility] OR[facility];  Service: Orthopedics;  Laterality: Left;   TUBAL LIGATION       FAMILY HISTORY   Family History  Problem Relation Age of Onset   Breast cancer Mother 53   Alzheimer's disease Mother      SOCIAL HISTORY   Social History   Tobacco Use   Smoking status: Former    Current packs/day: 0.00    Average packs/day: 0.8 packs/day for 41.0 years (30.8 ttl pk-yrs)     Types: Cigarettes    Start date: 12/07/1974    Quit date: 12/08/2015    Years since quitting: 8.7   Smokeless tobacco: Never  Vaping Use   Vaping status: Never Used  Substance Use Topics   Alcohol use: No   Drug use: No     MEDICATIONS    Home Medication:    Current Medication:  Current Facility-Administered Medications:    albuterol  (PROVENTIL ) (2.5 MG/3ML) 0.083% nebulizer solution 3 mL, 3 mL, Inhalation, Q4H PRN, Laurita Manor T, MD, 3 mL at 09/12/24 1908   aspirin  EC tablet 81 mg, 81 mg, Oral, Daily, Zhang, Ping T, MD, 81 mg at 09/12/24 0915   benzonatate (TESSALON) capsule 100 mg, 100 mg, Oral, TID PRN, Zhang, Ping T, MD   budesonide-glycopyrrolate -formoterol (BREZTRI ) 160-9-4.8 MCG/ACT inhaler 2 puff, 2 puff, Inhalation, BID, Laurita Manor T, MD, 2 puff at 09/12/24 2100   busPIRone  (BUSPAR ) tablet 7.5 mg, 7.5 mg, Oral, BID PRN, Laurita Manor T, MD, 7.5 mg at 09/11/24 2134   cyanocobalamin  (VITAMIN B12) injection 1,000 mcg, 1,000 mcg, Intramuscular, Once, Laurita Manor T, MD   docusate sodium (COLACE) capsule 200 mg, 200 mg, Oral, BID, Trudy, Jamiese M, MD, 200 mg at 09/12/24 2100   doxycycline  (VIBRA -TABS) tablet 100 mg, 100 mg, Oral, Q12H, Laurita Manor T, MD, 100 mg at 09/12/24 2100   enoxaparin  (LOVENOX ) injection 40 mg, 40 mg, Subcutaneous, Q24H, Zhang, Ping T, MD, 40 mg at 09/12/24 2100   ezetimibe  (ZETIA ) tablet 10 mg, 10 mg, Oral, Daily, Laurita Manor T, MD, 10 mg at 09/12/24 0915   famotidine  (PEPCID ) tablet 20 mg, 20 mg, Oral, BID PRN, Laurita Manor T, MD   feeding supplement (ENSURE ENLIVE / ENSURE PLUS) liquid 237 mL, 237 mL, Oral, BID BM, Laurita Manor T, MD, 237 mL at 09/12/24 0915   ipratropium-albuterol  (DUONEB) 0.5-2.5 (3) MG/3ML nebulizer solution 3 mL, 3 mL, Nebulization, Q6H, Laurita Manor T, MD, 3 mL at 09/13/24 0748   methylPREDNISolone  sodium succinate (SOLU-MEDROL ) 40 mg/mL injection 40 mg, 40 mg, Intravenous, Daily, Antonina Deziel, MD, 40 mg at 09/13/24 0859    metoprolol tartrate (LOPRESSOR) injection 5 mg, 5 mg, Intravenous, Q6H PRN, Trudy Anthony HERO, MD   montelukast  (SINGULAIR ) tablet 10 mg, 10 mg, Oral, QHS, Zhang, Ping T, MD, 10 mg at 09/12/24 2100   ondansetron  (ZOFRAN ) tablet 4 mg, 4 mg, Oral, Q6H PRN **OR** ondansetron  (ZOFRAN ) injection 4 mg, 4 mg, Intravenous, Q6H PRN, Laurita Manor T, MD   Oral care mouth rinse, 15 mL, Mouth Rinse, 4 times per day, Laurita Manor T, MD, 15 mL at 09/12/24 2103   Oral care mouth rinse, 15 mL, Mouth Rinse,  PRN, Zhang, Ping T, MD   pantoprazole (PROTONIX) EC tablet 40 mg, 40 mg, Oral, Daily, Zhang, Ping T, MD, 40 mg at 09/12/24 0915   polyethylene glycol (MIRALAX / GLYCOLAX) packet 17 g, 17 g, Oral, Daily PRN, Williams, Jamiese M, MD   QUEtiapine (SEROQUEL) tablet 25 mg, 25 mg, Oral, QHS, Zhang, Ping T, MD, 25 mg at 09/12/24 2100   sodium chloride  (OCEAN) 0.65 % nasal spray 1 spray, 1 spray, Each Nare, PRN, Trudy Anthony HERO, MD, 1 spray at 09/12/24 1305   tiZANidine  (ZANAFLEX ) tablet 2 mg, 2 mg, Oral, Q8H PRN, Laurita Cort DASEN, MD    ALLERGIES   Patient has no known allergies.     REVIEW OF SYSTEMS    Review of Systems:  Gen:  Denies  fever, sweats, chills weigh loss  HEENT: Denies blurred vision, double vision, ear pain, eye pain, hearing loss, nose bleeds, sore throat Cardiac:  No dizziness, chest pain or heaviness, chest tightness,edema Resp:   reports dyspnea chronically  Gi: Denies swallowing difficulty, stomach pain, nausea or vomiting, diarrhea, constipation, bowel incontinence Gu:  Denies bladder incontinence, burning urine Ext:   Denies Joint pain, stiffness or swelling Skin: Denies  skin rash, easy bruising or bleeding or hives Endoc:  Denies polyuria, polydipsia , polyphagia or weight change Psych:   Denies depression, insomnia or hallucinations   Other:  All other systems negative   VS: BP 105/74 (BP Location: Left Arm)   Pulse (!) 106 Comment: primary RN notified  Temp 98.3 F  (36.8 C) (Oral)   Resp 18   Ht 5' 4 (1.626 m)   Wt 54.6 kg   SpO2 96%   BMI 20.66 kg/m      PHYSICAL EXAM    GENERAL:NAD, no fevers, chills, no weakness no fatigue HEAD: Normocephalic, atraumatic.  EYES: Pupils equal, round, reactive to light. Extraocular muscles intact. No scleral icterus.  MOUTH: Moist mucosal membrane. Dentition intact. No abscess noted.  EAR, NOSE, THROAT: Clear without exudates. No external lesions.  NECK: Supple. No thyromegaly. No nodules. No JVD.  PULMONARY: wheezing is reduced and improved.  CARDIOVASCULAR: S1 and S2. Regular rate and rhythm. No murmurs, rubs, or gallops. No edema. Pedal pulses 2+ bilaterally.  GASTROINTESTINAL: Soft, nontender, nondistended. No masses. Positive bowel sounds. No hepatosplenomegaly.  MUSCULOSKELETAL: No swelling, clubbing, or edema. Range of motion full in all extremities.  NEUROLOGIC: Cranial nerves II through XII are intact. No gross focal neurological deficits. Sensation intact. Reflexes intact.  SKIN: No ulceration, lesions, rashes, or cyanosis. Skin warm and dry. Turgor intact.  PSYCHIATRIC: Mood, affect within normal limits. The patient is awake, alert and oriented x 3. Insight, judgment intact.       IMAGING   @IMAGES @  Narrative & Impression EXAM: 1 VIEW(S) XRAY OF THE CHEST 09/09/2024 08:31:00 AM   COMPARISON: 07/18/2024   CLINICAL HISTORY: sob, copd. Table formatting from the original note was not included.; patient coming in from home SOB for 2 weeks. Patient coming in on c-pap via EMS. ; hx of asthma, copd,. inital o2 67% on 4L [place] that she wears at home. patient was blue and mottled with ac3essory muscle use. 5 mg allbuterol, 2mg  mag, 125mg  solumed. Lung sounds were absent for EMS   FINDINGS:   LUNGS AND PLEURA: Hyperinflation with emphysema. No focal pulmonary opacity. No pulmonary edema. No pleural effusion. No pneumothorax.   HEART AND MEDIASTINUM: Aortic atherosclerosis. No acute  abnormality of the cardiac and mediastinal silhouettes.   BONES AND  SOFT TISSUES: No acute osseous abnormality.   IMPRESSION: 1. Hyperinflation with emphysema, consistent with COPD. 2. Aortic atherosclerosis.   Electronically signed by: Evalene Coho MD 09/09/2024 08:38 AM EDT RP Workstation: [id number]     ASSESSMENT/PLAN   Acute exacerbation of asthma and COPD overlap (ACOS) Continue Singulair  10 mg p.o. nightly - Continue Breztri  Aerosphere twice daily - Wean Solu-Medrol  40 twice daily now to once daily  - CRP trend today - Pulmonary rehab once at chronic stable state and consider inpatient rehab postdischarge - s/p TTE     Thank you for allowing me to participate in the care of this patient.   Patient/Family are satisfied with care plan and all questions have been answered.    Provider disclosure: Patient with at least one acute or chronic illness or injury that poses a threat to life or bodily function and is being managed actively during this encounter.  All of the below services have been performed independently by signing provider:  review of prior documentation from internal and or external health records.  Review of previous and current lab results.  Interview and comprehensive assessment during patient visit today. Review of current and previous chest radiographs/CT scans. Discussion of management and test interpretation with health care team and patient/family.   This document was prepared using Dragon voice recognition software and may include unintentional dictation errors.     Kandis Henry, M.D.  Division of Pulmonary & Critical Care Medicine

## 2024-09-13 NOTE — TOC Initial Note (Addendum)
 Transition of Care [facility]) - Initial/Assessment Note    Patient Details  Name: Brandi [name] MRN: [id number] Date of Birth: [birth year]  [facility] CM/SW Contact:    Lauraine JAYSON Carpen, LCSW Phone Number: 09/13/2024, 11:05 AM  Clinical Narrative:    CSW met with patient. No family at bedside. CSW introduced role and explained that therapy recommendations would be discussed. Patient asked about a rehab facility because she lives alone and feels like she can barely get up to go to the bathroom. Per PT evaluation, she walked a total of 220 feet with them. Explained that patient has to meet criteria for a rehab facility. CSW has started home health search. Patient is agreeable to RW and 3-in-1. Will order once discharge date determined. Patient confirmed she uses 4 L of oxygen  at home but could not remember the name of the agency. Per chart review, it might be [facility]. Will reach out to liaison to see what her orders are for. No further concerns. CSW will continue to follow patient for support and facilitate return home once stable. She is unsure who will pick her up at discharge. Her daughter lives two hours away.            12:13 pm: Patient's ambulatory status declined with PT today from last time they worked with her. CSW started SNF search. Patient is aware. PASARR under manual review. Asked MD to [facility] and 30-day note.  3:45 pm: Uploaded clinicals into [place] [facility] for PASARR review.  Expected Discharge Plan: Home w Home Health Services Barriers to Discharge: Continued Medical Work up   Patient Goals and CMS Choice   CMS Medicare.gov Compare Post Acute Care list provided to:: Patient        Expected Discharge Plan and Services     Post Acute Care Choice: Home Health, Durable Medical Equipment Living arrangements for the past 2 months: Single Family Home                                      Prior Living Arrangements/Services Living arrangements for the past 2  months: Single Family Home Lives with:: Self Patient language and need for interpreter reviewed:: Yes Do you feel safe going back to the place where you live?: Yes      Need for Family Participation in Patient Care: Yes (Comment)   Current home services: DME Criminal Activity/Legal Involvement Pertinent to Current Situation/Hospitalization: No - Comment as needed  Activities of Daily Living      Permission Sought/Granted Permission sought to share information with : Facility Industrial/product designer granted to share information with : Yes, Verbal Permission Granted     Permission granted to share info w AGENCY: Home health agencies        Emotional Assessment Appearance:: Appears stated age Attitude/Demeanor/Rapport: Engaged Affect (typically observed): Appropriate Orientation: : Oriented to Self, Oriented to Place, Oriented to  Time, Oriented to Situation Alcohol / Substance Use: Not Applicable Psych Involvement: No (comment)  Admission diagnosis:  COPD exacerbation (HCC) [J44.1] Acute on chronic respiratory failure with hypoxia and hypercapnia (HCC) [G03.78, J96.22] Patient Active Problem List   Diagnosis Date Noted   COPD with acute exacerbation (HCC) 09/09/2024   COPD exacerbation (HCC) 09/09/2024   Generalized anxiety disorder with panic attacks 09/01/2024   Osteoporosis 11/29/2023   Rash and nonspecific skin eruption 05/22/2023   Facial tingling sensation 05/22/2023   Pre-diabetes  04/08/2021   Vitamin D  deficiency 04/08/2021   COVID-19 vaccination declined 12/04/2020   Centrilobular emphysema (HCC) 04/08/2020   Nodule of upper lobe of left lung 04/08/2020   Aortic atherosclerosis 05/21/2019   Coronary artery calcification seen on CT scan 05/21/2019   Former smoker 05/21/2019   Hyperlipidemia 05/21/2019   PCP:  Edman [name], DO Pharmacy:   [facility] [place], [place] - [address] [place] [address] Phone:  [phone] Fax: [phone]  [id number] [place], [place] - [address] AT [facility] OF [address] [place] [address] [place] [address] Phone: [phone] Fax: [phone]  [facility] #[id number], [place] - [address] AT [facility] OF [address] [facility] [place] [address] Phone: [phone] Fax: [phone]  [facility] - [place], [place] - [address] [place] [address] Phone: [phone] Fax: [phone]  [facility] - [place], [place] - [address] [place] [address] Phone: [phone] Fax: [phone]     Social Drivers of Health (SDOH) Social History: SDOH Screenings   Food Insecurity: No Food Insecurity (09/09/2024)  Recent Concern: Food Insecurity - Food Insecurity Present (08/03/2024)   Received from [facility]  Housing: Low Risk  (09/09/2024)  Transportation Needs: No Transportation Needs (09/09/2024)  Utilities: Not At Risk (09/09/2024)  Alcohol Screen: Low Risk  (09/09/2023)  Depression (PHQ2-9): Low Risk  (09/01/2024)  Financial Resource Strain: Medium Risk (08/03/2024)   Received from [facility]  Physical Activity: Sufficiently Active (09/09/2023)  Social Connections: Moderately Isolated (09/09/2024)  Stress: No Stress Concern Present (05/09/2024)  Tobacco Use: Medium Risk (09/09/2024)  Health Literacy: Adequate Health Literacy (05/09/2024)   SDOH Interventions:     Readmission Risk Interventions     No data to display

## 2024-09-13 NOTE — Progress Notes (Signed)
 Physical Therapy Treatment Patient Details Name: Brandi [name] MRN: [id number] DOB: 1956/07/19 Today's Date: 09/13/2024   History of Present Illness 68 y.o. female with medical history significant of COPD Gold stage III with chronic hypoxic respite failure on 3 L continuously and chronic steroid, HLD, anxiety/depression, presented with worsening cough wheezing shortness of breath x2wks.    PT Comments  Pt was long sitting in bed on 3 L o2. Slightly SOB even at rest. HR 110 bpm. She agrees to session but requested to use BSC to urinate/have BM prior to ambulation. Pt was able to exit bed, stand, and take a few steps to [facility] with supervision only. Extreme SOB with HR elevation to 133 bpm. Pt did have successful BM however due to severity of SOB, unable to ambulate at this time. Pt requested breathing treatment and rest prior to attempting ambulation. Care team made aware. Pt does not feel safe to DC directly home from acute admission.  Acute PT will continue to follow and progress per current POC. Pt will benefit from post acute PT to maximize her activity tolerance and safety with all ADLs.    If plan is discharge home, recommend the following: A little help with walking and/or transfers;A little help with bathing/dressing/bathroom     Equipment Recommendations  Rollator (4 wheels)       Precautions / Restrictions Precautions Precautions: Fall Precaution/Restrictions Comments: monitor HR/O2 Restrictions Weight Bearing Restrictions Per Provider Order: No     Mobility  Bed Mobility Overal bed mobility: Modified Independent   Transfers Overall transfer level: Needs assistance Equipment used: Rolling walker (2 wheels) Transfers: Sit to/from Stand Sit to Stand: Supervision  General transfer comment: Pt is able to stand EOB to RW without physical assistance however becomes very SOb with only taking a few steps form EOB to BSC. SOB continues throughout pt having BM. HR elevated to 133bpm in  sitting. pt requested not to ambulate due to fatigue/SOB. Pt reports she is on 4 L o2 at baseline and requested to be placed back on 4 L. pt was on 3 L upon arrival.    Ambulation/Gait Ambulation/Gait assistance: Supervision Gait Distance (Feet): 3 Feet Assistive device: Rolling walker (2 wheels) Gait Pattern/deviations: Step-to pattern  General Gait Details: Pt was able to take a few steps form EOB to BSC. SOB/fatigue more limiting than strength. Pt has poor activity tolerance.  I live alone and can barely get to the BR on my own. I need to go to rehab.    Balance Overall balance assessment: Needs assistance Sitting-balance support: No upper extremity supported, Feet supported Sitting balance-Leahy Scale: Good  Standing balance support: Bilateral upper extremity supported, During functional activity, Reliant on assistive device for balance Standing balance-Leahy Scale: Fair       Hotel manager: No apparent difficulties  Cognition Arousal: Alert Behavior During Therapy: WFL for tasks assessed/performed   PT - Cognitive impairments: No apparent impairments    PT - Cognition Comments: Pt is A and O Following commands: Intact      Cueing Cueing Techniques: Verbal cues, Tactile cues         Pertinent Vitals/Pain Pain Assessment Pain Assessment: No/denies pain           PT Goals (current goals can now be found in the care plan section) Acute Rehab PT Goals Patient Stated Goal: breath better Progress towards PT goals: Not progressing toward goals - comment (SOB and HR concerns limiting progress)    Frequency    Min  2X/week       AM-PAC PT 6 Clicks Mobility   Outcome Measure  Help needed turning from your back to your side while in a flat bed without using bedrails?: None Help needed moving from lying on your back to sitting on the side of a flat bed without using bedrails?: A Little Help needed moving to and from a bed to a chair  (including a wheelchair)?: A Little Help needed standing up from a chair using your arms (e.g., wheelchair or bedside chair)?: A Little Help needed to walk in hospital room?: A Little Help needed climbing 3-5 steps with a railing? : A Little 6 Click Score: 19    End of Session Equipment Utilized During Treatment: Oxygen  (4L at baseline) Activity Tolerance: Patient limited by fatigue;Other (comment) (limited by SOB with minimal activity) Patient left: in bed;with call bell/phone within reach Nurse Communication: Mobility status PT Visit Diagnosis: Muscle weakness (generalized) (M62.81);Difficulty in walking, not elsewhere classified (R26.2)     Time: 8895-8881 PT Time Calculation (min) (ACUTE ONLY): 14 min  Charges:    $Therapeutic Activity: 8-22 mins PT General Charges $$ ACUTE PT VISIT: 1 Visit                    Rankin Essex PTA 09/13/24, 11:51 AM

## 2024-09-13 NOTE — Progress Notes (Signed)
 PROGRESS NOTE    Brandi [name]  FMW:[id number] DOB: 06/22/1956 DOA: 09/09/2024 PCP: Edman [name], DO   Brief Narrative:  Brandi [name] is a 68 y.o. 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  Hospitalist called for admission in the setting of COPD exacerbation.     Assessment & Plan:   Principal Problem:   COPD exacerbation (HCC) Active Problems:   COPD with acute exacerbation (HCC)   Acute on chronic hypoxic respiratory failure Secondary to COPD exacerbation versus asthma exacerbation, acute, POA - Baseline oxygen  at 3 L nasal cannula, requiring upwards of 4 L nasal cannula at rest at this time -continues to desat into the 80s with any exertion - Continue supportive care, steroids, bronchodilators, incentive spirometry and flutter - Advanced stage COPD per PCCM - 'BODE score >9 indicating a poor prognosis' -Infectious etiology ruled out given negative cultures and imaging -Initially BiPAP dependent for respiratory support but markedly noncompliant with this device despite education that noncompliance would increase her risk of morbidity mortality and death.   Depression: severity unknown. Continue on home dose of buspar   GERD: continue on PPI Moderate protein calorie malnutrition: continue on nutritional supplements as tolerated, advance diet appropriately, tolerating p.o. quite well today Noncompliance -patient notably noncompliant with medications as well as with respiratory support despite multiple discussions with multiple providers.  Unclear if patient is also noncompliant with medications and regimen at home.  DVT prophylaxis: enoxaparin  (LOVENOX ) injection 40 mg Start: 09/09/24 2200 Code Status:   Code Status: Full Code Family Communication: None present  Status is:  Inpatient  Dispo: The patient is from: Home              Anticipated d/c is to: Home              Anticipated d/c date is: 24 to 48 hours              Patient currently not medically stable for discharge given ongoing hypoxia from baseline despite supplemental oxygen   Consultants:  PCCM  Procedures:  None  Antimicrobials:  Doxycycline   Subjective: No acute issues or events overnight denies nausea vomiting diarrhea constipation headache fevers chills or chest pain.  Shortness of breath ongoing worse with exertion but moderately improved from intake  Objective: Vitals:   09/13/24 0750 09/13/24 1149 09/13/24 1335 09/13/24 1555  BP:   110/69 109/68  Pulse:   (!) 110 (!) 115  Resp:   20   Temp:   98.9 F (37.2 C) 98.4 F (36.9 C)  TempSrc:    Oral  SpO2: 96% 96% 98% 98%  Weight:      Height:        Intake/Output Summary (Last 24 hours) at 09/13/2024 1741 Last data filed at 09/13/2024 1425 Gross per 24 hour  Intake 480 ml  Output 1350 ml  Net -870 ml   Filed Weights   09/09/24 0817  Weight: 54.6 kg    Examination:  General exam: Appears calm and comfortable  Respiratory system: Clear to auscultation. Respiratory effort normal. Cardiovascular system: S1 & S2 heard, RRR. No JVD, murmurs, rubs, gallops or clicks. No pedal edema. Gastrointestinal system: Abdomen is nondistended, soft and nontender. No organomegaly or masses felt. Normal bowel sounds heard. Central nervous system: Alert and oriented. No focal neurological deficits. Extremities: Symmetric 5 x 5 power. Skin: No rashes, lesions  or ulcers Psychiatry: Judgement and insight appear normal. Mood & affect appropriate.     Data Reviewed: I have personally reviewed following labs and imaging studies  CBC: Recent Labs  Lab 09/09/24 0825 09/10/24 0416 09/11/24 0526 09/12/24 0421 09/13/24 0517  WBC 10.3 8.3 11.9* 9.4 10.3  HGB 13.3 11.7* 12.1 12.2 11.8*  HCT 41.5 35.4* 37.0 37.0 36.1  MCV 98.3 95.9 97.6  96.9 98.9  PLT 284 253 291 295 295   Basic Metabolic Panel: Recent Labs  Lab 09/09/24 0825 09/11/24 0526 09/12/24 0421 09/13/24 0517  NA 135 140 137 138  K 3.9 4.8 4.5 4.0  CL 94* 101 100 99  CO2 27 30 29 30   GLUCOSE 158* 127* 127* 90  BUN 15 29* 25* 26*  CREATININE 0.59 0.55 0.57 0.63  CALCIUM  8.7* 9.0 8.7* 8.5*   GFR: Estimated Creatinine Clearance: 58 mL/min (by C-G formula based on SCr of 0.63 mg/dL). Liver Function Tests: Recent Labs  Lab 09/09/24 0825  AST 23  ALT 19  ALKPHOS 36*  BILITOT 0.6  PROT 6.7  ALBUMIN 3.7   No results for input(s): LIPASE, AMYLASE in the last 168 hours. No results for input(s): AMMONIA in the last 168 hours. Coagulation Profile: No results for input(s): INR, PROTIME in the last 168 hours. Cardiac Enzymes: No results for input(s): CKTOTAL, CKMB, CKMBINDEX, TROPONINI in the last 168 hours. BNP (last 3 results) No results for input(s): PROBNP in the last 8760 hours. HbA1C: No results for input(s): HGBA1C in the last 72 hours. CBG: No results for input(s): GLUCAP in the last 168 hours. Lipid Profile: No results for input(s): CHOL, HDL, LDLCALC, TRIG, CHOLHDL, LDLDIRECT in the last 72 hours. Thyroid  Function Tests: No results for input(s): TSH, T4TOTAL, FREET4, T3FREE, THYROIDAB in the last 72 hours. Anemia Panel: No results for input(s): VITAMINB12, FOLATE, FERRITIN, TIBC, IRON, RETICCTPCT in the last 72 hours. Sepsis Labs: No results for input(s): PROCALCITON, LATICACIDVEN in the last 168 hours.  Recent Results (from the past 240 hours)  Resp panel by RT-PCR (RSV, Flu A&B, Covid) Anterior Nasal Swab     Status: None   Collection Time: 09/09/24  8:25 AM   Specimen: Anterior Nasal Swab  Result Value Ref Range Status   SARS Coronavirus 2 by RT PCR NEGATIVE NEGATIVE Final    Comment: (NOTE) SARS-CoV-2 target nucleic acids are NOT DETECTED.  The SARS-CoV-2 RNA is  generally detectable in upper respiratory specimens during the acute phase of infection. The lowest concentration of SARS-CoV-2 viral copies this assay can detect is 138 copies/mL. A negative result does not preclude SARS-Cov-2 infection and should not be used as the sole basis for treatment or other patient management decisions. A negative result may occur with  improper specimen collection/handling, submission of specimen other than nasopharyngeal swab, presence of viral mutation(s) within the areas targeted by this assay, and inadequate number of viral copies(<138 copies/mL). A negative result must be combined with clinical observations, patient history, and epidemiological information. The expected result is Negative.  Fact Sheet for Patients:  BloggerCourse.com  Fact Sheet for Healthcare Providers:  SeriousBroker.it  This test is no t yet approved or cleared by the [place]  FDA and  has been authorized for detection and/or diagnosis of SARS-CoV-2 by FDA under an Emergency Use Authorization (EUA). This EUA will remain  in effect (meaning this test can be used) for the duration of the COVID-19 declaration under Section 564(b)(1) of the Act, 21 U.S.C.section 360bbb-3(b)(1), unless the authorization is  terminated  or revoked sooner.       Influenza A by PCR NEGATIVE NEGATIVE Final   Influenza B by PCR NEGATIVE NEGATIVE Final    Comment: (NOTE) The Xpert Xpress SARS-CoV-2/FLU/RSV plus assay is intended as an aid in the diagnosis of influenza from Nasopharyngeal swab specimens and should not be used as a sole basis for treatment. Nasal washings and aspirates are unacceptable for Xpert Xpress SARS-CoV-2/FLU/RSV testing.  Fact Sheet for Patients: BloggerCourse.com  Fact Sheet for Healthcare Providers: SeriousBroker.it  This test is not yet approved or cleared by the [place] FDA and has been authorized for detection and/or diagnosis of SARS-CoV-2 by FDA under an Emergency Use Authorization (EUA). This EUA will remain in effect (meaning this test can be used) for the duration of the COVID-19 declaration under Section 564(b)(1) of the Act, 21 U.S.C. section 360bbb-3(b)(1), unless the authorization is terminated or revoked.     Resp Syncytial Virus by PCR NEGATIVE NEGATIVE Final    Comment: (NOTE) Fact Sheet for Patients: BloggerCourse.com  Fact Sheet for Healthcare Providers: SeriousBroker.it  This test is not yet approved or cleared by the [place]  FDA and has been authorized for detection and/or diagnosis of SARS-CoV-2 by FDA under an Emergency Use Authorization (EUA). This EUA will remain in effect (meaning this test can be used) for the duration of the COVID-19 declaration under Section 564(b)(1) of the Act, 21 U.S.C. section 360bbb-3(b)(1), unless the authorization is terminated or revoked.  Performed at [facility], [address] [place], [place] [address]   Respiratory (~20 pathogens) panel by PCR     Status: None   Collection Time: 09/09/24 10:39 AM   Specimen: Nasopharyngeal Swab; Respiratory  Result Value Ref Range Status   Adenovirus NOT DETECTED NOT DETECTED Final   Coronavirus 229E NOT DETECTED NOT DETECTED Final    Comment: (NOTE) The Coronavirus on the Respiratory Panel, DOES NOT test for the novel  Coronavirus (2019 nCoV)    Coronavirus HKU1 NOT DETECTED NOT DETECTED Final   Coronavirus NL63 NOT DETECTED NOT DETECTED Final   Coronavirus OC43 NOT DETECTED NOT DETECTED Final   Metapneumovirus NOT DETECTED NOT DETECTED Final   Rhinovirus / Enterovirus NOT DETECTED NOT DETECTED Final   Influenza A NOT DETECTED NOT DETECTED Final   Influenza B NOT DETECTED NOT DETECTED Final   Parainfluenza Virus 1 NOT DETECTED NOT DETECTED Final   Parainfluenza Virus 2 NOT  DETECTED NOT DETECTED Final   Parainfluenza Virus 3 NOT DETECTED NOT DETECTED Final   Parainfluenza Virus 4 NOT DETECTED NOT DETECTED Final   Respiratory Syncytial Virus NOT DETECTED NOT DETECTED Final   Bordetella pertussis NOT DETECTED NOT DETECTED Final   Bordetella Parapertussis NOT DETECTED NOT DETECTED Final   Chlamydophila pneumoniae NOT DETECTED NOT DETECTED Final   Mycoplasma pneumoniae NOT DETECTED NOT DETECTED Final    Comment: Performed at [facility], [address] [place], [place] [address]  Expectorated Sputum Assessment w Gram Stain, Rflx to Resp Cult     Status: None   Collection Time: 09/09/24 11:05 PM   Specimen: Expectorated Sputum  Result Value Ref Range Status   Specimen Description EXPECTORATED SPUTUM  Final   Special Requests NONE  Final   Sputum evaluation   Final    Sputum specimen not acceptable for testing.  Please recollect.   Performed at [facility], [address], [place], [place] [address]    Report Status 09/10/2024 FINAL  Final  Radiology Studies: DG Chest Port 1 View Result Date: 09/12/2024 EXAM: 1 VIEW(S) XRAY OF THE CHEST 09/12/2024 04:51:00 PM COMPARISON: 09/09/2024 CLINICAL HISTORY: Atelectasis FINDINGS: LUNGS AND PLEURA: Stable emphysema. No focal pulmonary opacity. No pulmonary edema. No pleural effusion. No pneumothorax. HEART AND MEDIASTINUM: Aortic atherosclerosis. Unchanged heart size and mediastinal contours. BONES AND SOFT TISSUES: No acute osseous abnormality. IMPRESSION: 1. Stable emphysema. No acute pulmonary process. 2. Aortic atherosclerosis. Electronically signed by: Andrea Gasman MD 09/12/2024 08:20 PM EDT RP Workstation: [id number]   ECHOCARDIOGRAM COMPLETE BUBBLE STUDY Result Date: 09/12/2024    ECHOCARDIOGRAM REPORT   Patient Name:   Brandi [name] Date of Exam: 09/12/2024 Medical Rec #:  [id number]     Height:       64.0 in Accession #:    [id number]    Weight:       120.4 lb Date of Birth:   [birth year]    BSA:          1.577 m Patient Age:    67 years      BP:           124/78 mmHg Patient Gender: F             HR:           97 bpm. Exam Location:  [facility] Procedure: 2D Echo, Color Doppler, Cardiac Doppler and Saline Contrast Bubble            Study (Both Spectral and Color Flow Doppler were utilized during            procedure). Indications:     Diastolic CHF  History:         Patient has prior history of Echocardiogram examinations, most                  recent 08/16/2021. COPD; Risk Factors:Dyslipidemia. Anxiety.  Sonographer:     Christopher Furnace Referring Phys:  [id number] FUAD ALESKEROV Diagnosing Phys: Lonni Hanson MD  Sonographer Comments: Technically challenging study due to limited acoustic windows, suboptimal apical window and suboptimal parasternal window. Image acquisition challenging due to COPD. IMPRESSIONS  1. Left ventricular ejection fraction, by estimation, is 65 to 70%. The left ventricle has normal function. Left ventricular endocardial border not optimally defined to evaluate regional wall motion. There is mild left ventricular hypertrophy. Indeterminate diastolic filling due to E-A fusion.  2. Right ventricular systolic function is normal. The right ventricular size is normal. Mildly increased right ventricular wall thickness. Tricuspid regurgitation signal is inadequate for assessing PA pressure.  3. The mitral valve is grossly normal. No evidence of mitral valve regurgitation. No evidence of mitral stenosis.  4. The aortic valve was not well visualized. Aortic valve regurgitation is not visualized. No aortic stenosis is present.  5. The inferior vena cava is normal in size with greater than 50% respiratory variability, suggesting right atrial pressure of 3 mmHg.  6. Bubble study does not show a large right-to-left shunt but cannot exclude a small intracardiac shunt due to suboptimal windows. FINDINGS  Left Ventricle: Left ventricular ejection fraction, by estimation, is 65 to 70%. The  left ventricle has normal function. Left ventricular endocardial border not optimally defined to evaluate regional wall motion. The left ventricular internal cavity size was normal in size. There is mild left ventricular hypertrophy. Indeterminate diastolic filling due to E-A fusion. Right Ventricle: The right ventricular size is normal. Mildly increased right ventricular wall thickness. Right ventricular systolic function is normal. Tricuspid regurgitation signal is inadequate for  assessing PA pressure. Left Atrium: Left atrial size was normal in size. Right Atrium: Right atrial size was normal in size. Pericardium: There is no evidence of pericardial effusion. Presence of epicardial fat layer. Mitral Valve: The mitral valve is grossly normal. No evidence of mitral valve regurgitation. No evidence of mitral valve stenosis. Tricuspid Valve: The tricuspid valve is grossly normal. Tricuspid valve regurgitation is trivial. Aortic Valve: The aortic valve was not well visualized. Aortic valve regurgitation is not visualized. No aortic stenosis is present. Aortic valve mean gradient measures 2.5 mmHg. Aortic valve peak gradient measures 4.0 mmHg. Aortic valve area, by VTI measures 3.01 cm. Pulmonic Valve: The pulmonic valve was not well visualized. Pulmonic valve regurgitation is not visualized. No evidence of pulmonic stenosis. Aorta: The aortic root is normal in size and structure. Pulmonary Artery: The pulmonary artery is not well seen. Venous: The inferior vena cava is normal in size with greater than 50% respiratory variability, suggesting right atrial pressure of 3 mmHg. IAS/Shunts: No atrial level shunt detected by color flow Doppler. Agitated saline contrast was given intravenously to evaluate for intracardiac shunting. Bubble study does not show a large right-to-left shunt but cannot exclude a small intracardiac shunt  due to suboptimal windows.  LEFT VENTRICLE PLAX 2D LVIDd:         3.00 cm   Diastology LVIDs:          2.10 cm   LV e' medial:    6.42 cm/s LV PW:         1.10 cm   LV E/e' medial:  9.1 LV IVS:        1.00 cm   LV e' lateral:   13.70 cm/s LVOT diam:     2.00 cm   LV E/e' lateral: 4.3 LV SV:         42 LV SV Index:   27 LVOT Area:     3.14 cm  RIGHT VENTRICLE RV Basal diam:  3.30 cm RV Mid diam:    2.60 cm LEFT ATRIUM           Index       RIGHT ATRIUM           Index LA diam:      2.20 cm 1.40 cm/m  RA Area:     12.70 cm LA Vol (A2C): 14.0 ml 8.88 ml/m  RA Volume:   31.10 ml  19.73 ml/m LA Vol (A4C): 4.5 ml  2.84 ml/m  AORTIC VALVE AV Area (Vmax):    2.68 cm AV Area (Vmean):   2.86 cm AV Area (VTI):     3.01 cm AV Vmax:           100.10 cm/s AV Vmean:          69.750 cm/s AV VTI:            0.140 m AV Peak Grad:      4.0 mmHg AV Mean Grad:      2.5 mmHg LVOT Vmax:         85.40 cm/s LVOT Vmean:        63.500 cm/s LVOT VTI:          0.134 m LVOT/AV VTI ratio: 0.96  AORTA Ao Root diam: 2.60 cm MITRAL VALVE MV Area (PHT): 7.66 cm    SHUNTS MV Decel Time: 99 msec     Systemic VTI:  0.13 m MV E velocity: 58.70 cm/s  Systemic Diam: 2.00 cm MV A velocity: 90.10  cm/s MV E/A ratio:  0.65 Lonni End MD Electronically signed by Lonni Hanson MD Signature Date/Time: 09/12/2024/5:58:57 PM    Final         Scheduled Meds:  aspirin  EC  81 mg Oral Daily   budesonide-glycopyrrolate -formoterol  2 puff Inhalation BID   docusate sodium  200 mg Oral BID   doxycycline   100 mg Oral Q12H   enoxaparin  (LOVENOX ) injection  40 mg Subcutaneous Q24H   ezetimibe   10 mg Oral Daily   feeding supplement  237 mL Oral BID BM   ipratropium-albuterol   3 mL Nebulization TID   montelukast   10 mg Oral QHS   mouth rinse  15 mL Mouth Rinse 4 times per day   pantoprazole  40 mg Oral Daily   [START ON 09/14/2024] predniSONE   50 mg Oral Q breakfast   Followed by   NOREEN ON 09/15/2024] predniSONE   45 mg Oral Q breakfast   Followed by   NOREEN ON 09/16/2024] predniSONE   40 mg Oral Q breakfast   Followed by   NOREEN  ON 09/17/2024] predniSONE   35 mg Oral Q breakfast   Followed by   NOREEN ON 09/18/2024] predniSONE   30 mg Oral Q breakfast   Followed by   NOREEN ON 09/19/2024] predniSONE   25 mg Oral Q breakfast   Followed by   NOREEN ON 09/20/2024] predniSONE   20 mg Oral Q breakfast   Followed by   NOREEN ON 09/21/2024] predniSONE   15 mg Oral Q breakfast   Followed by   NOREEN ON 09/22/2024] predniSONE   10 mg Oral Q breakfast   Followed by   NOREEN ON 09/23/2024] predniSONE   5 mg Oral Q breakfast   QUEtiapine  25 mg Oral QHS   Continuous Infusions:   LOS: 4 days    Time spent:    Elsie JAYSON Montclair, DO [facility]  If 7PM-7AM, please contact night-coverage [email or web address]  09/13/2024, 5:41 PM

## 2024-09-13 NOTE — Plan of Care (Signed)

## 2024-09-13 NOTE — Progress Notes (Signed)
 Physical Therapy Treatment Patient Details Name: Brandi [name] MRN: [id number] DOB: 1956-02-08 Today's Date: 09/13/2024   History of Present Illness 69 y.o. female with medical history significant of COPD Gold stage III with chronic hypoxic respite failure on 3 L continuously and chronic steroid, HLD, anxiety/depression, presented with worsening cough wheezing shortness of breath x2wks.    PT Comments  Pt was long sitting in bed upon arrival. Much less anxious/SOB this afternoon versus earlier in the day. Pt was able to easily exit L side of bed, stand to rollator, and tolerate ambulation ~ 250 ft. Sao2 > 94% on baseline 4 L, with HR only peaking at 118 bpm versus earlier hitting 133bpm with just taking a few steps. Author feels pt's anxiety plays large role in her presentation and abilities. DC recs update. Encouraged pt to consider staying with family or friend at DC for additional safety. Author also encouraged pt to get rollator so she will always have a seat close if needed. Acute PT will continue to follow and progress per current POC.    If plan is discharge home, recommend the following: A little help with walking and/or transfers;A little help with bathing/dressing/bathroom     Equipment Recommendations  Rollator (4 wheels)       Precautions / Restrictions Precautions Precautions: Fall Precaution/Restrictions Comments: monitor HR/O2 Restrictions Weight Bearing Restrictions Per Provider Order: No     Mobility  Bed Mobility Overal bed mobility: Modified Independent   Transfers Overall transfer level: Needs assistance Equipment used: Rolling walker (2 wheels) Transfers: Sit to/from Stand Sit to Stand: Supervision  General transfer comment: Pt is able to stand EOB to RW without physical assistance however becomes very SOb with only taking a few steps form EOB to BSC. SOB continues throughout pt having BM. HR elevated to 133bpm in sitting. pt requested not to ambulate due to  fatigue/SOB. Pt reports she is on 4 L o2 at baseline and requested to be placed back on 4 L. pt was on 3 L upon arrival.    Ambulation/Gait Ambulation/Gait assistance: Supervision Gait Distance (Feet): 220 Feet Assistive device: Rollator (4 wheels) Gait Pattern/deviations: Step-through pattern  General Gait Details: pt was much less SOB this afternoon versus earlier in the day. sao2 >943% with HR peaking only at 118bpm.   Balance Overall balance assessment: Needs assistance Sitting-balance support: No upper extremity supported, Feet supported Sitting balance-Leahy Scale: Good     Standing balance support: Bilateral upper extremity supported, During functional activity, Reliant on assistive device for balance Standing balance-Leahy Scale: Good      Communication Communication Communication: No apparent difficulties  Cognition Arousal: Alert Behavior During Therapy: WFL for tasks assessed/performed   PT - Cognitive impairments: No apparent impairments    PT - Cognition Comments: Pt is A and O x 4. less anxious versus earlier in the day. Following commands: Intact      Cueing Cueing Techniques: Verbal cues, Tactile cues         Pertinent Vitals/Pain Pain Assessment Pain Assessment: No/denies pain     PT Goals (current goals can now be found in the care plan section) Acute Rehab PT Goals Patient Stated Goal: breath better Progress towards PT goals: Progressing toward goals    Frequency    Min 2X/week       AM-PAC PT 6 Clicks Mobility   Outcome Measure  Help needed turning from your back to your side while in a flat bed without using bedrails?: None Help needed moving from lying  on your back to sitting on the side of a flat bed without using bedrails?: A Little Help needed moving to and from a bed to a chair (including a wheelchair)?: A Little Help needed standing up from a chair using your arms (e.g., wheelchair or bedside chair)?: A Little Help needed to  walk in hospital room?: A Little Help needed climbing 3-5 steps with a railing? : A Little 6 Click Score: 19    End of Session Equipment Utilized During Treatment: Oxygen  (4L at baseline) Activity Tolerance: Patient limited by fatigue;Other (comment) (limited by SOB with minimal activity) Patient left: in bed;with call bell/phone within reach Nurse Communication: Mobility status PT Visit Diagnosis: Muscle weakness (generalized) (M62.81);Difficulty in walking, not elsewhere classified (R26.2)     Time: 8489-8474 PT Time Calculation (min) (ACUTE ONLY): 15 min  Charges:    $Gait Training: 8-22 mins PT General Charges $$ ACUTE PT VISIT: 1 Visit                    Rankin Essex PTA 09/13/24, 4:35 PM

## 2024-09-14 ENCOUNTER — Encounter: Payer: Self-pay | Admitting: Internal Medicine

## 2024-09-14 DIAGNOSIS — J441 Chronic obstructive pulmonary disease with (acute) exacerbation: Secondary | ICD-10-CM | POA: Diagnosis not present

## 2024-09-14 DIAGNOSIS — G4700 Insomnia, unspecified: Secondary | ICD-10-CM | POA: Insufficient documentation

## 2024-09-14 LAB — CBC
HCT: 37.6 % (ref 36.0–46.0)
Hemoglobin: 11.9 g/dL — ABNORMAL LOW (ref 12.0–15.0)
MCH: 31.6 pg (ref 26.0–34.0)
MCHC: 31.6 g/dL (ref 30.0–36.0)
MCV: 100 fL (ref 80.0–100.0)
Platelets: 285 K/uL (ref 150–400)
RBC: 3.76 MIL/uL — ABNORMAL LOW (ref 3.87–5.11)
RDW: 11.9 % (ref 11.5–15.5)
WBC: 10.1 K/uL (ref 4.0–10.5)
nRBC: 0 % (ref 0.0–0.2)

## 2024-09-14 LAB — BASIC METABOLIC PANEL WITH GFR
Anion gap: 8 (ref 5–15)
BUN: 28 mg/dL — ABNORMAL HIGH (ref 8–23)
CO2: 30 mmol/L (ref 22–32)
Calcium: 8.7 mg/dL — ABNORMAL LOW (ref 8.9–10.3)
Chloride: 98 mmol/L (ref 98–111)
Creatinine, Ser: 0.52 mg/dL (ref 0.44–1.00)
GFR, Estimated: >60 mL/min (ref >60)
Glucose, Bld: 89 mg/dL (ref 70–99)
Potassium: 4 mmol/L (ref 3.5–5.1)
Sodium: 136 mmol/L (ref 135–145)

## 2024-09-14 NOTE — Assessment & Plan Note (Signed)
 Quetiapine 25 mg nightly resume

## 2024-09-14 NOTE — Assessment & Plan Note (Addendum)
Ezetimibe 10 mg daily

## 2024-09-14 NOTE — Plan of Care (Signed)

## 2024-09-14 NOTE — Assessment & Plan Note (Signed)
 Home buspirone  7.5 mg p.o. twice daily as needed for anxiety

## 2024-09-14 NOTE — Progress Notes (Signed)
 Occupational Therapy Treatment Patient Details Name: Royalty Fakhouri MRN: [id number] DOB: [birth year] Today's Date: 09/14/2024   History of present illness 68 y.o. female with medical history significant of COPD Gold stage III with chronic hypoxic respite failure on 3 L continuously and chronic steroid, HLD, anxiety/depression, presented with worsening cough wheezing shortness of breath x2wks.   OT comments  Pt is supine in bed on arrival. Pleasant and agreeable to OT session. She denies pain. Pt engaged in ADL session this date requiring CGA to supervision for all functional transfers/ADL transfers within the room. Sp02 89-95% on 4L with HR up to 128 with activity. LB dressing/bathing required Mod A and UB bathing set up assist, UB dressing at Min A level. All aspects of toileting performed at supervision level. Pt requires frequent rest breaks throughout activities and cues for PLB, pacing and ECS. Pt returned to bed with all needs in place and will cont to require skilled acute OT services to maximize her safety and IND to return to PLOF.       If plan is discharge home, recommend the following:  A little help with walking and/or transfers;Assistance with cooking/housework;Assist for transportation;Help with stairs or ramp for entrance;A little help with bathing/dressing/bathroom   Equipment Recommendations  BSC/3in1;Other (comment) (RW)    Recommendations for Other Services      Precautions / Restrictions Precautions Precautions: Fall Precaution/Restrictions Comments: monitor HR/O2 Restrictions Weight Bearing Restrictions Per Provider Order: No       Mobility Bed Mobility Overal bed mobility: Modified Independent                  Transfers Overall transfer level: Needs assistance Equipment used: Rolling walker (2 wheels) Transfers: Sit to/from Stand Sit to Stand: Supervision           General transfer comment: supervision to stand from EOB, able to perform ADL  transfers within room on 4L with CGA/SBA sp02 stable, HR up to 128     Balance Overall balance assessment: Needs assistance Sitting-balance support: No upper extremity supported, Feet supported Sitting balance-Leahy Scale: Good     Standing balance support: Bilateral upper extremity supported, During functional activity, Reliant on assistive device for balance Standing balance-Leahy Scale: Good Standing balance comment: shaky/weak at times                           ADL either performed or assessed with clinical judgement   ADL Overall ADL's : Needs assistance/impaired     Grooming: Sitting;Set up;Wash/dry hands;Wash/dry face;Oral care;Applying deodorant;Brushing hair   Upper Body Bathing: Sitting;Supervision/ safety;Set up   Lower Body Bathing: Sit to/from stand;Moderate assistance;Sitting/lateral leans Lower Body Bathing Details (indicate cue type and reason): seated bathing on BSC and standing for peri-area, assist for below the knee and bil feet Upper Body Dressing : Sitting;Minimal assistance   Lower Body Dressing: Sit to/from stand;Moderate assistance;Minimal assistance Lower Body Dressing Details (indicate cue type and reason): to donn over bil feet Toilet Transfer: Ambulation;BSC/3in1;Rolling walker (2 wheels);Contact guard assist;Supervision/safety;Regular Teacher, adult education Details (indicate cue type and reason): cont urine on regular toilet and bathing completed on [facility] Toileting- Clothing Manipulation and Hygiene: Sit to/from stand;Supervision/safety Toileting - Clothing Manipulation Details (indicate cue type and reason): cont urine on toilet     Functional mobility during ADLs: Contact guard assist;Rolling walker (2 wheels)      Extremity/Trunk Assessment              Vision  Perception     Praxis     Communication Communication Communication: No apparent difficulties   Cognition Arousal: Alert Behavior During Therapy: WFL for  tasks assessed/performed                                 Following commands: Intact        Cueing   Cueing Techniques: Verbal cues, Tactile cues  Exercises      Shoulder Instructions       General Comments frequent rest breaks required throughout ADL session, cues for PLB stable sp02 89-95% on 4L and HR up to 128 with activity    Pertinent Vitals/ Pain       Pain Assessment Pain Assessment: No/denies pain  Home Living                                          Prior Functioning/Environment              Frequency  Min 2X/week        Progress Toward Goals  OT Goals(current goals can now be found in the care plan section)  Progress towards OT goals: Progressing toward goals  Acute Rehab OT Goals Patient Stated Goal: improve breathing OT Goal Formulation: With patient Time For Goal Achievement: 09/25/24 Potential to Achieve Goals: Good  Plan      Co-evaluation                 AM-PAC OT 6 Clicks Daily Activity     Outcome Measure   Help from another person eating meals?: None Help from another person taking care of personal grooming?: None Help from another person toileting, which includes using toliet, bedpan, or urinal?: A Little Help from another person bathing (including washing, rinsing, drying)?: A Little Help from another person to put on and taking off regular upper body clothing?: None Help from another person to put on and taking off regular lower body clothing?: A Little 6 Click Score: 21    End of Session Equipment Utilized During Treatment: Oxygen ;Rolling walker (2 wheels)  OT Visit Diagnosis: Unsteadiness on feet (R26.81);Other abnormalities of gait and mobility (R26.89);Muscle weakness (generalized) (M62.81)   Activity Tolerance Patient tolerated treatment well   Patient Left in bed;with call bell/phone within reach;with bed alarm set   Nurse Communication Mobility status        Time:  8566-8479 OT Time Calculation (min): 47 min  Charges: OT General Charges $OT Visit: 1 Visit OT Treatments $Self Care/Home Management : 38-52 mins  Reuben Knoblock, OTR/L  09/14/24, 4:34 PM   Latrena Benegas E Donya Hitch 09/14/2024, 4:31 PM

## 2024-09-14 NOTE — Assessment & Plan Note (Addendum)
 Continue home oxygen  supplementation At baseline, patient uses 2 L nasal cannula At home past three 2 puff inhalation twice daily, montelukast  10 mg nightly DuoNebs 3 times daily Albuterol  nebulizer every 4 hours as needed for wheezing and shortness of breath Tessalon capsules, 100 mg p.o. 3 times daily as needed for cough  09/14/2024: Patient states she does not have BiPAP machine.  I discussed extensively with pulmonologist who states that the patient does not need BiPAP therapy at this time.  Pulmonologist states that they can evaluate patient's respiratory status on an outpatient basis to determine if patient needs BiPAP therapy.  09/15/24: Patient has only required inhalers and has remained on 4 L nasal cannula as appropriate.  Her vitals and labs were reviewed and were unremarkable.  Extensive discussion with patient at bedside that she no longer meets inpatient criteria.  There will be as needed anxiety and a tapering steroid medication dose prescribed and sent to her pharmacy.  Again I discussed with her pulmonologist, she needs to go home and follow-up outpatient for pulmonary function testing for BiPAP evaluation and possible referral for pulmonary transplant specialist per her wish.  I discussed with daughter over the phone.    3 and 1 BSC, rollator walker, PT, OT, RN aide has been placed.  I discussed extensively with case worker, RN, pulmonologist, patient, daughter Ms. Moishe over the phone

## 2024-09-14 NOTE — H&P (Signed)
 PROGRESS NOTE    Brandi [name]  FMW:[id number] DOB: [birth year] DOA: 09/09/2024 PCP: Edman [name], DO   No notes on file  Brief Narrative:   [name] is a 68 y.o. 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 Hospitalist called for admission in the setting of COPD exacerbation.   Assessment & Plan:   Principal Problem:   COPD with acute exacerbation (HCC) Active Problems:   Former smoker   Hyperlipidemia   Generalized anxiety disorder with panic attacks   Insomnia   Assessment and Plan:  * COPD with acute exacerbation (HCC) Continue home oxygen  supplementation At baseline, patient uses 2 L nasal cannula At home past three 2 puff inhalation twice daily, montelukast  10 mg nightly DuoNebs 3 times daily Albuterol  nebulizer every 4 hours as needed for wheezing and shortness of breath Tessalon capsules, 100 mg p.o. 3 times daily as needed for cough  09/14/2024: Patient states she does not have BiPAP machine.  Discussed with pulmonologist who states that the patient does not need BiPAP therapy at this time.  Pulmonologist states that they can evaluate patient's respiratory status on an outpatient basis to determine if patient needs BiPAP therapy.  Insomnia Quetiapine 25 mg nightly resume  Generalized anxiety disorder with panic attacks Home buspirone  7.5 mg p.o. twice daily as needed for anxiety  Hyperlipidemia Ezetimibe  10 mg daily  DVT prophylaxis: Enoxaparin  Code Status: Full code Family Communication: Disposition Plan:  Level of care: Progressive  Consultants:  Pulmonology  Procedures:  Not indicated  Antimicrobials: Patient completed 5 days of doxycycline   Subjective: She states she feels better with BiPAP therapy.  She states she does not have BiPAP machine at  home.  States she is not ready to go home  Objective: Vitals:   09/14/24 0436 09/14/24 0735 09/14/24 0756 09/14/24 1117  BP: 105/64 124/80  117/70  Pulse: (!) 102 (!) 108  (!) 113  Resp: 20 20  20   Temp: 97.7 F (36.5 C) 97.8 F (36.6 C)  98.6 F (37 C)  TempSrc:  Oral  Oral  SpO2: 98% 98% 96% 98%  Weight:      Height:        Intake/Output Summary (Last 24 hours) at 09/14/2024 1444 Last data filed at 09/14/2024 1300 Gross per 24 hour  Intake 480 ml  Output --  Net 480 ml   Filed Weights   09/09/24 0817  Weight: 54.6 kg   Examination:  General exam: Appears calm and comfortable  Respiratory system: BiPAP machine in place.  Clear to auscultation. Respiratory effort normal. Cardiovascular system: S1 & S2 heard, RRR. No JVD, murmurs, rubs, gallops or clicks. No pedal edema. Gastrointestinal system: Abdomen is nondistended, soft and nontender. No organomegaly or masses felt. Normal bowel sounds heard. Central nervous system: Alert and oriented. No focal neurological deficits. Extremities: Symmetric 5 x 5 power. Skin: No rashes, lesions or ulcers Psychiatry: Judgement and insight appear normal. Mood & affect appropriate.   Data Reviewed: I have personally reviewed following labs and imaging studies  CBC: Recent Labs  Lab 09/10/24 0416 09/11/24 0526 09/12/24 0421 09/13/24 0517 09/14/24 0435  WBC 8.3 11.9* 9.4 10.3 10.1  HGB 11.7* 12.1 12.2 11.8* 11.9*  HCT 35.4* 37.0 37.0 36.1 37.6  MCV 95.9 97.6 96.9 98.9 100.0  PLT 253 291 295 295 285   Basic Metabolic Panel:  Recent Labs  Lab 09/09/24 0825 09/11/24 0526 09/12/24 0421 09/13/24 0517 09/14/24 0435  NA 135 140 137 138 136  K 3.9 4.8 4.5 4.0 4.0  CL 94* 101 100 99 98  CO2 27 30 29 30 30   GLUCOSE 158* 127* 127* 90 89  BUN 15 29* 25* 26* 28*  CREATININE 0.59 0.55 0.57 0.63 0.52  CALCIUM  8.7* 9.0 8.7* 8.5* 8.7*   GFR: Estimated Creatinine Clearance: 58 mL/min (by C-G formula based on SCr of 0.52  mg/dL).  Liver Function Tests: Recent Labs  Lab 09/09/24 0825  AST 23  ALT 19  ALKPHOS 36*  BILITOT 0.6  PROT 6.7  ALBUMIN 3.7   Recent Results (from the past 240 hours)  Resp panel by RT-PCR (RSV, Flu A&B, Covid) Anterior Nasal Swab     Status: None   Collection Time: 09/09/24  8:25 AM   Specimen: Anterior Nasal Swab  Result Value Ref Range Status   SARS Coronavirus 2 by RT PCR NEGATIVE NEGATIVE Final    Comment: (NOTE) SARS-CoV-2 target nucleic acids are NOT DETECTED.  The SARS-CoV-2 RNA is generally detectable in upper respiratory specimens during the acute phase of infection. The lowest concentration of SARS-CoV-2 viral copies this assay can detect is 138 copies/mL. A negative result does not preclude SARS-Cov-2 infection and should not be used as the sole basis for treatment or other patient management decisions. A negative result may occur with  improper specimen collection/handling, submission of specimen other than nasopharyngeal swab, presence of viral mutation(s) within the areas targeted by this assay, and inadequate number of viral copies(<138 copies/mL). A negative result must be combined with clinical observations, patient history, and epidemiological information. The expected result is Negative.  Fact Sheet for Patients:  BloggerCourse.com  Fact Sheet for Healthcare Providers:  SeriousBroker.it  This test is no t yet approved or cleared by the [place]  FDA and  has been authorized for detection and/or diagnosis of SARS-CoV-2 by FDA under an Emergency Use Authorization (EUA). This EUA will remain  in effect (meaning this test can be used) for the duration of the COVID-19 declaration under Section 564(b)(1) of the Act, 21 U.S.C.section 360bbb-3(b)(1), unless the authorization is terminated  or revoked sooner.       Influenza A by PCR NEGATIVE NEGATIVE Final   Influenza B by PCR NEGATIVE NEGATIVE  Final    Comment: (NOTE) The Xpert Xpress SARS-CoV-2/FLU/RSV plus assay is intended as an aid in the diagnosis of influenza from Nasopharyngeal swab specimens and should not be used as a sole basis for treatment. Nasal washings and aspirates are unacceptable for Xpert Xpress SARS-CoV-2/FLU/RSV testing.  Fact Sheet for Patients: BloggerCourse.com  Fact Sheet for Healthcare Providers: SeriousBroker.it  This test is not yet approved or cleared by the [place]  FDA and has been authorized for detection and/or diagnosis of SARS-CoV-2 by FDA under an Emergency Use Authorization (EUA). This EUA will remain in effect (meaning this test can be used) for the duration of the COVID-19 declaration under Section 564(b)(1) of the Act, 21 U.S.C. section 360bbb-3(b)(1), unless the authorization is terminated or revoked.     Resp Syncytial Virus by PCR NEGATIVE NEGATIVE Final    Comment: (NOTE) Fact Sheet for Patients: BloggerCourse.com  Fact Sheet for Healthcare Providers: SeriousBroker.it  This test is not yet approved or cleared by the [place]  FDA and has been authorized for detection and/or diagnosis of SARS-CoV-2 by FDA under an Emergency Use Authorization (EUA). This EUA will remain in effect (  meaning this test can be used) for the duration of the COVID-19 declaration under Section 564(b)(1) of the Act, 21 U.S.C. section 360bbb-3(b)(1), unless the authorization is terminated or revoked.  Performed at [facility], [address] [place], [place] [address]   Respiratory (~20 pathogens) panel by PCR     Status: None   Collection Time: 09/09/24 10:39 AM   Specimen: Nasopharyngeal Swab; Respiratory  Result Value Ref Range Status   Adenovirus NOT DETECTED NOT DETECTED Final   Coronavirus 229E NOT DETECTED NOT DETECTED Final    Comment: (NOTE) The Coronavirus on the  Respiratory Panel, DOES NOT test for the novel  Coronavirus (2019 nCoV)    Coronavirus HKU1 NOT DETECTED NOT DETECTED Final   Coronavirus NL63 NOT DETECTED NOT DETECTED Final   Coronavirus OC43 NOT DETECTED NOT DETECTED Final   Metapneumovirus NOT DETECTED NOT DETECTED Final   Rhinovirus / Enterovirus NOT DETECTED NOT DETECTED Final   Influenza A NOT DETECTED NOT DETECTED Final   Influenza B NOT DETECTED NOT DETECTED Final   Parainfluenza Virus 1 NOT DETECTED NOT DETECTED Final   Parainfluenza Virus 2 NOT DETECTED NOT DETECTED Final   Parainfluenza Virus 3 NOT DETECTED NOT DETECTED Final   Parainfluenza Virus 4 NOT DETECTED NOT DETECTED Final   Respiratory Syncytial Virus NOT DETECTED NOT DETECTED Final   Bordetella pertussis NOT DETECTED NOT DETECTED Final   Bordetella Parapertussis NOT DETECTED NOT DETECTED Final   Chlamydophila pneumoniae NOT DETECTED NOT DETECTED Final   Mycoplasma pneumoniae NOT DETECTED NOT DETECTED Final    Comment: Performed at [facility], [address], [place], [place] [address]  Expectorated Sputum Assessment w Gram Stain, Rflx to Resp Cult     Status: None   Collection Time: 09/09/24 11:05 PM   Specimen: Expectorated Sputum  Result Value Ref Range Status   Specimen Description EXPECTORATED SPUTUM  Final   Special Requests NONE  Final   Sputum evaluation   Final    Sputum specimen not acceptable for testing.  Please recollect.   Performed at [facility], [address] [place] [place], [place] [address]    Report Status 09/10/2024 FINAL  Final    Radiology Studies: DG Chest Port 1 View Result Date: 09/12/2024 EXAM: 1 VIEW(S) XRAY OF THE CHEST 09/12/2024 04:51:00 PM COMPARISON: 09/09/2024 CLINICAL HISTORY: Atelectasis FINDINGS: LUNGS AND PLEURA: Stable emphysema. No focal pulmonary opacity. No pulmonary edema. No pleural effusion. No pneumothorax. HEART AND MEDIASTINUM: Aortic atherosclerosis. Unchanged heart size and mediastinal contours.  BONES AND SOFT TISSUES: No acute osseous abnormality. IMPRESSION: 1. Stable emphysema. No acute pulmonary process. 2. Aortic atherosclerosis. Electronically signed by: Andrea Gasman MD 09/12/2024 08:20 PM EDT RP Workstation: [id number]   ECHOCARDIOGRAM COMPLETE BUBBLE STUDY Result Date: 09/12/2024    ECHOCARDIOGRAM REPORT   Patient Name:   Brandi [name] Date of Exam: 09/12/2024 Medical Rec #:  [id number]     Height:       64.0 in Accession #:    [id number]    Weight:       120.4 lb Date of Birth:  06/13/1956    BSA:          1.577 m Patient Age:    67 years      BP:           124/78 mmHg Patient Gender: F             HR:           97 bpm. Exam  Location:  [facility] Procedure: 2D Echo, Color Doppler, Cardiac Doppler and Saline Contrast Bubble            Study (Both Spectral and Color Flow Doppler were utilized during            procedure). Indications:     Diastolic CHF  History:         Patient has prior history of Echocardiogram examinations, most                  recent 08/16/2021. COPD; Risk Factors:Dyslipidemia. Anxiety.  Sonographer:     Christopher Furnace Referring Phys:  [id number] FUAD ALESKEROV Diagnosing Phys: Lonni Hanson MD  Sonographer Comments: Technically challenging study due to limited acoustic windows, suboptimal apical window and suboptimal parasternal window. Image acquisition challenging due to COPD. IMPRESSIONS  1. Left ventricular ejection fraction, by estimation, is 65 to 70%. The left ventricle has normal function. Left ventricular endocardial border not optimally defined to evaluate regional wall motion. There is mild left ventricular hypertrophy. Indeterminate diastolic filling due to E-A fusion.  2. Right ventricular systolic function is normal. The right ventricular size is normal. Mildly increased right ventricular wall thickness. Tricuspid regurgitation signal is inadequate for assessing PA pressure.  3. The mitral valve is grossly normal. No evidence of mitral valve regurgitation. No evidence of  mitral stenosis.  4. The aortic valve was not well visualized. Aortic valve regurgitation is not visualized. No aortic stenosis is present.  5. The inferior vena cava is normal in size with greater than 50% respiratory variability, suggesting right atrial pressure of 3 mmHg.  6. Bubble study does not show a large right-to-left shunt but cannot exclude a small intracardiac shunt due to suboptimal windows. FINDINGS  Left Ventricle: Left ventricular ejection fraction, by estimation, is 65 to 70%. The left ventricle has normal function. Left ventricular endocardial border not optimally defined to evaluate regional wall motion. The left ventricular internal cavity size was normal in size. There is mild left ventricular hypertrophy. Indeterminate diastolic filling due to E-A fusion. Right Ventricle: The right ventricular size is normal. Mildly increased right ventricular wall thickness. Right ventricular systolic function is normal. Tricuspid regurgitation signal is inadequate for assessing PA pressure. Left Atrium: Left atrial size was normal in size. Right Atrium: Right atrial size was normal in size. Pericardium: There is no evidence of pericardial effusion. Presence of epicardial fat layer. Mitral Valve: The mitral valve is grossly normal. No evidence of mitral valve regurgitation. No evidence of mitral valve stenosis. Tricuspid Valve: The tricuspid valve is grossly normal. Tricuspid valve regurgitation is trivial. Aortic Valve: The aortic valve was not well visualized. Aortic valve regurgitation is not visualized. No aortic stenosis is present. Aortic valve mean gradient measures 2.5 mmHg. Aortic valve peak gradient measures 4.0 mmHg. Aortic valve area, by VTI measures 3.01 cm. Pulmonic Valve: The pulmonic valve was not well visualized. Pulmonic valve regurgitation is not visualized. No evidence of pulmonic stenosis. Aorta: The aortic root is normal in size and structure. Pulmonary Artery: The pulmonary artery is  not well seen. Venous: The inferior vena cava is normal in size with greater than 50% respiratory variability, suggesting right atrial pressure of 3 mmHg. IAS/Shunts: No atrial level shunt detected by color flow Doppler. Agitated saline contrast was given intravenously to evaluate for intracardiac shunting. Bubble study does not show a large right-to-left shunt but cannot exclude a small intracardiac shunt  due to suboptimal windows.  LEFT VENTRICLE PLAX 2D LVIDd:  3.00 cm   Diastology LVIDs:         2.10 cm   LV e' medial:    6.42 cm/s LV PW:         1.10 cm   LV E/e' medial:  9.1 LV IVS:        1.00 cm   LV e' lateral:   13.70 cm/s LVOT diam:     2.00 cm   LV E/e' lateral: 4.3 LV SV:         42 LV SV Index:   27 LVOT Area:     3.14 cm  RIGHT VENTRICLE RV Basal diam:  3.30 cm RV Mid diam:    2.60 cm LEFT ATRIUM           Index       RIGHT ATRIUM           Index LA diam:      2.20 cm 1.40 cm/m  RA Area:     12.70 cm LA Vol (A2C): 14.0 ml 8.88 ml/m  RA Volume:   31.10 ml  19.73 ml/m LA Vol (A4C): 4.5 ml  2.84 ml/m  AORTIC VALVE AV Area (Vmax):    2.68 cm AV Area (Vmean):   2.86 cm AV Area (VTI):     3.01 cm AV Vmax:           100.10 cm/s AV Vmean:          69.750 cm/s AV VTI:            0.140 m AV Peak Grad:      4.0 mmHg AV Mean Grad:      2.5 mmHg LVOT Vmax:         85.40 cm/s LVOT Vmean:        63.500 cm/s LVOT VTI:          0.134 m LVOT/AV VTI ratio: 0.96  AORTA Ao Root diam: 2.60 cm MITRAL VALVE MV Area (PHT): 7.66 cm    SHUNTS MV Decel Time: 99 msec     Systemic VTI:  0.13 m MV E velocity: 58.70 cm/s  Systemic Diam: 2.00 cm MV A velocity: 90.10 cm/s MV E/A ratio:  0.65 Christopher End MD Electronically signed by Lonni Hanson MD Signature Date/Time: 09/12/2024/5:58:57 PM    Final    Scheduled Meds:  aspirin  EC  81 mg Oral Daily   budesonide-glycopyrrolate -formoterol  2 puff Inhalation BID   docusate sodium  200 mg Oral BID   enoxaparin  (LOVENOX ) injection  40 mg Subcutaneous Q24H    ezetimibe   10 mg Oral Daily   feeding supplement  237 mL Oral BID BM   ipratropium-albuterol   3 mL Nebulization TID   melatonin  5 mg Oral QHS   montelukast   10 mg Oral QHS   mouth rinse  15 mL Mouth Rinse 4 times per day   pantoprazole  40 mg Oral Daily   [START ON 09/15/2024] predniSONE   45 mg Oral Q breakfast   Followed by   NOREEN ON 09/16/2024] predniSONE   40 mg Oral Q breakfast   Followed by   NOREEN ON 09/17/2024] predniSONE   35 mg Oral Q breakfast   Followed by   NOREEN ON 09/18/2024] predniSONE   30 mg Oral Q breakfast   Followed by   NOREEN ON 09/19/2024] predniSONE   25 mg Oral Q breakfast   Followed by   NOREEN ON 09/20/2024] predniSONE   20 mg Oral Q breakfast   Followed by   NOREEN ON  09/21/2024] predniSONE   15 mg Oral Q breakfast   Followed by   NOREEN ON 09/22/2024] predniSONE   10 mg Oral Q breakfast   Followed by   NOREEN ON 09/23/2024] predniSONE   5 mg Oral Q breakfast   QUEtiapine  25 mg Oral QHS   Continuous Infusions:   LOS: 5 days   Time spent: 70  Dr. Sherre [facility]  If 7PM-7AM, please contact night-coverage  09/14/2024, 2:44 PM

## 2024-09-14 NOTE — Progress Notes (Signed)
 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  She also has a background of dyslipidemia.  She has been on inhaler therapy for COPD and asthma as well as recent introduction of prednisone  at 5 mg/day with Bactrim  thrice weekly.  She felt initially improved but reports approximately 1 week ago onset of chest discomfort with worsening hypoxemia.  On admission she had a viral workup done and is negative for 20 species respiratory viral panel as well as negative for RSV/COVID/flu.  She is being treated for acute exacerbation of asthma and COPD with Solu-Medrol  40 mg as well as nebulizer therapy and her home Breztri  twice daily she also has rhinitis and is taking Singulair  for this which has been continued.  09/12/24-  patient is improved today.  She is closes to baseline.  Her wheezing is almost completely resolved although she shares there was a bad spell this morning.  She is on solumedrol 40 IV bid and I have reduced this to once daily now.  She is on Breztri  BID, Duoneb and albuterol . She is on doxycycline  100 bid with a course to be completed for 7d course.   09/14/24- patient seen at bedside.  She reports ongoing dyspnea and shares she does not wish to go home since she has no one to help take care of her. She is now at 4L/min [place] home setting and is with advanced stage 4 COPD.  She may benefit from palliative evaluation.    PFT  ponent Ref Range & Units (hover) 5 mo ago 1 yr ago  FVC-Pre 1.91 2.44  FVC-%Pred-Pre 62 78  FVC-Post 2.35 2.67  FVC-%Pred-Post 76 86   FVC-%Change-Post 22 9  FEV1-Pre 0.75 1.00  FEV1-%Pred-Pre 32 42  FEV1-Post 1.04 1.18  FEV1-%Pred-Post 44 50  FEV1-%Change-Post 38 17  FEV6-Pre 1.72 2.19  FEV6-%Pred-Pre 58 73  FEV6-Post 2.21 2.50  FEV6-%Pred-Post 75 84  FEV6-%Change-Post 28 13  Pre FEV1/FVC ratio 39 41  FEV1FVC-%Pred-Pre 51 53  Post FEV1/FVC ratio 44 44  FEV1FVC-%Change-Post 13 7  Pre FEV6/FVC Ratio 90 90  FEV6FVC-%Pred-Pre 93 93  Post FEV6/FVC ratio 94 94  FEV6FVC-%Pred-Post 98 97  FEV6FVC-%Change-Post 4 4  FEF 25-75 Pre 0.26 0.37  FEF2575-%Pred-Pre 13 18  FEF 25-75 Post 0.57 0.54  FEF2575-%Pred-Post 28 26  FEF2575-%Change-Post 115 45  RV 4.41 3.69  RV % pred 205 173  TLC 6.98 6.30  TLC % pred 138 124  DLCO unc 9.04 6.69  DLCO unc % pred 46 34  DL/VA 7.77 8.44  DL/VA % pred 53 37  Resulting Agency [facility]        Specimen Collected: 04/05/24 13:56 Last Resulted: 04/05/24 14:41        PAST MEDICAL HISTORY   Past Medical History:  Diagnosis Date   Anxiety    COPD (chronic obstructive pulmonary disease) (HCC)    Hyperlipidemia      SURGICAL HISTORY   Past Surgical History:  Procedure Laterality  Date   APPENDECTOMY     CHOLECYSTECTOMY     SHOULDER ARTHROSCOPY Left 07/02/2015   Procedure: ,left shoulder arthroscopy, decompression and debridement;  Surgeon: Norleen JINNY Maltos, MD;  Location: [facility];  Service: Orthopedics;  Laterality: Left;   SHOULDER CLOSED REDUCTION Left 04/09/2015   Procedure: CLOSED MANIPULATION SHOULDER;  Surgeon: Ozell Flake, MD;  Location: [facility];  Service: Orthopedics;  Laterality: Left;   TUBAL LIGATION       FAMILY HISTORY   Family History  Problem Relation Age of Onset   Breast cancer Mother 12   Alzheimer's disease Mother      SOCIAL HISTORY   Social History   Tobacco Use   Smoking status: Former    Current packs/day: 0.00    Average packs/day: 0.8 packs/day for 41.0 years (30.8 ttl pk-yrs)    Types: Cigarettes    Start date:  12/07/1974    Quit date: 12/08/2015    Years since quitting: 8.7   Smokeless tobacco: Never  Vaping Use   Vaping status: Never Used  Substance Use Topics   Alcohol use: No   Drug use: No     MEDICATIONS    Home Medication:    Current Medication:  Current Facility-Administered Medications:    albuterol  (PROVENTIL ) (2.5 MG/3ML) 0.083% nebulizer solution 3 mL, 3 mL, Inhalation, Q4H PRN, Laurita Manor T, MD, 3 mL at 09/13/24 1148   aspirin  EC tablet 81 mg, 81 mg, Oral, Daily, Laurita, Ping T, MD, 81 mg at 09/14/24 0816   benzonatate (TESSALON) capsule 100 mg, 100 mg, Oral, TID PRN, Zhang, Ping T, MD   budesonide-glycopyrrolate -formoterol (BREZTRI ) 160-9-4.8 MCG/ACT inhaler 2 puff, 2 puff, Inhalation, BID, Laurita Manor T, MD, 2 puff at 09/14/24 0815   busPIRone  (BUSPAR ) tablet 7.5 mg, 7.5 mg, Oral, BID PRN, Laurita Manor T, MD, 7.5 mg at 09/14/24 0820   docusate sodium (COLACE) capsule 200 mg, 200 mg, Oral, BID, Trudy Coop M, MD, 200 mg at 09/14/24 9183   enoxaparin  (LOVENOX ) injection 40 mg, 40 mg, Subcutaneous, Q24H, Zhang, Ping T, MD, 40 mg at 09/13/24 2138   ezetimibe  (ZETIA ) tablet 10 mg, 10 mg, Oral, Daily, Laurita Manor T, MD, 10 mg at 09/14/24 9182   famotidine  (PEPCID ) tablet 20 mg, 20 mg, Oral, BID PRN, Laurita Manor T, MD   feeding supplement (ENSURE ENLIVE / ENSURE PLUS) liquid 237 mL, 237 mL, Oral, BID BM, Laurita Manor T, MD, 237 mL at 09/14/24 0815   ipratropium-albuterol  (DUONEB) 0.5-2.5 (3) MG/3ML nebulizer solution 3 mL, 3 mL, Nebulization, TID, Jonanthan Bolender, MD, 3 mL at 09/14/24 0756   melatonin tablet 5 mg, 5 mg, Oral, QHS, Cleatus, Hazel V, MD   metoprolol tartrate (LOPRESSOR) injection 5 mg, 5 mg, Intravenous, Q6H PRN, Trudy Coop HERO, MD   montelukast  (SINGULAIR ) tablet 10 mg, 10 mg, Oral, QHS, Zhang, Ping T, MD, 10 mg at 09/13/24 2138   ondansetron  (ZOFRAN ) tablet 4 mg, 4 mg, Oral, Q6H PRN **OR** ondansetron  (ZOFRAN ) injection 4 mg, 4 mg, Intravenous, Q6H PRN, Laurita Manor T, MD   Oral care mouth rinse, 15 mL, Mouth Rinse, 4 times per day, Laurita Manor T, MD, 15 mL at 09/14/24 0817   Oral care mouth rinse, 15 mL, Mouth Rinse, PRN, Laurita Manor T, MD   pantoprazole (PROTONIX) EC tablet 40 mg, 40 mg, Oral, Daily, Zhang, Ping T, MD, 40 mg at 09/14/24 0816   polyethylene glycol (MIRALAX / GLYCOLAX) packet 17 g, 17 g, Oral, Daily PRN, Trudy Coop  M, MD   [COMPLETED] predniSONE  (DELTASONE ) tablet 50 mg, 50 mg, Oral, Q breakfast, 50 mg at 09/14/24 0817 **FOLLOWED BY** [START ON 09/15/2024] predniSONE  (DELTASONE ) tablet 45 mg, 45 mg, Oral, Q breakfast **FOLLOWED BY** [START ON 09/16/2024] predniSONE  (DELTASONE ) tablet 40 mg, 40 mg, Oral, Q breakfast **FOLLOWED BY** [START ON 09/17/2024] predniSONE  (DELTASONE ) tablet 35 mg, 35 mg, Oral, Q breakfast **FOLLOWED BY** [START ON 09/18/2024] predniSONE  (DELTASONE ) tablet 30 mg, 30 mg, Oral, Q breakfast **FOLLOWED BY** [START ON 09/19/2024] predniSONE  (DELTASONE ) tablet 25 mg, 25 mg, Oral, Q breakfast **FOLLOWED BY** [START ON 09/20/2024] predniSONE  (DELTASONE ) tablet 20 mg, 20 mg, Oral, Q breakfast **FOLLOWED BY** [START ON 09/21/2024] predniSONE  (DELTASONE ) tablet 15 mg, 15 mg, Oral, Q breakfast **FOLLOWED BY** [START ON 09/22/2024] predniSONE  (DELTASONE ) tablet 10 mg, 10 mg, Oral, Q breakfast **FOLLOWED BY** [START ON 09/23/2024] predniSONE  (DELTASONE ) tablet 5 mg, 5 mg, Oral, Q breakfast, Everette Dimauro, MD   QUEtiapine (SEROQUEL) tablet 25 mg, 25 mg, Oral, QHS, Zhang, Ping T, MD, 25 mg at 09/13/24 2141   sodium chloride  (OCEAN) 0.65 % nasal spray 1 spray, 1 spray, Each Nare, PRN, Trudy Anthony HERO, MD, 1 spray at 09/14/24 0818   tiZANidine  (ZANAFLEX ) tablet 2 mg, 2 mg, Oral, Q8H PRN, Laurita Cort DASEN, MD    ALLERGIES   Patient has no known allergies.     REVIEW OF SYSTEMS    Review of Systems:  Gen:  Denies  fever, sweats, chills weigh loss  HEENT: Denies blurred vision, double vision, ear pain, eye pain,  hearing loss, nose bleeds, sore throat Cardiac:  No dizziness, chest pain or heaviness, chest tightness,edema Resp:   reports dyspnea chronically  Gi: Denies swallowing difficulty, stomach pain, nausea or vomiting, diarrhea, constipation, bowel incontinence Gu:  Denies bladder incontinence, burning urine Ext:   Denies Joint pain, stiffness or swelling Skin: Denies  skin rash, easy bruising or bleeding or hives Endoc:  Denies polyuria, polydipsia , polyphagia or weight change Psych:   Denies depression, insomnia or hallucinations   Other:  All other systems negative   VS: BP 124/80 (BP Location: Left Arm)   Pulse (!) 108   Temp 97.8 F (36.6 C) (Oral)   Resp 20   Ht 5' 4 (1.626 m)   Wt 54.6 kg   SpO2 96%   BMI 20.66 kg/m      PHYSICAL EXAM    GENERAL:NAD, no fevers, chills, no weakness no fatigue HEAD: Normocephalic, atraumatic.  EYES: Pupils equal, round, reactive to light. Extraocular muscles intact. No scleral icterus.  MOUTH: Moist mucosal membrane. Dentition intact. No abscess noted.  EAR, NOSE, THROAT: Clear without exudates. No external lesions.  NECK: Supple. No thyromegaly. No nodules. No JVD.  PULMONARY: wheezing is reduced and improved.  CARDIOVASCULAR: S1 and S2. Regular rate and rhythm. No murmurs, rubs, or gallops. No edema. Pedal pulses 2+ bilaterally.  GASTROINTESTINAL: Soft, nontender, nondistended. No masses. Positive bowel sounds. No hepatosplenomegaly.  MUSCULOSKELETAL: No swelling, clubbing, or edema. Range of motion full in all extremities.  NEUROLOGIC: Cranial nerves II through XII are intact. No gross focal neurological deficits. Sensation intact. Reflexes intact.  SKIN: No ulceration, lesions, rashes, or cyanosis. Skin warm and dry. Turgor intact.  PSYCHIATRIC: Mood, affect within normal limits. The patient is awake, alert and oriented x 3. Insight, judgment intact.       IMAGING   @IMAGES @  Narrative & Impression EXAM: 1 VIEW(S) XRAY OF THE  CHEST 09/09/2024 08:31:00 AM   COMPARISON: 07/18/2024  CLINICAL HISTORY: sob, copd. Table formatting from the original note was not included.; patient coming in from home SOB for 2 weeks. Patient coming in on c-pap via EMS. ; hx of asthma, copd,. inital o2 67% on 4L [place] that she wears at home. patient was blue and mottled with ac3essory muscle use. 5 mg allbuterol, 2mg  mag, 125mg  solumed. Lung sounds were absent for EMS   FINDINGS:   LUNGS AND PLEURA: Hyperinflation with emphysema. No focal pulmonary opacity. No pulmonary edema. No pleural effusion. No pneumothorax.   HEART AND MEDIASTINUM: Aortic atherosclerosis. No acute abnormality of the cardiac and mediastinal silhouettes.   BONES AND SOFT TISSUES: No acute osseous abnormality.   IMPRESSION: 1. Hyperinflation with emphysema, consistent with COPD. 2. Aortic atherosclerosis.   Electronically signed by: Evalene Coho MD 09/09/2024 08:38 AM EDT RP Workstation: [id number]     ASSESSMENT/PLAN   Acute exacerbation of asthma and COPD overlap (ACOS) Continue Singulair  10 mg p.o. nightly - Continue Breztri  Aerosphere twice daily - prednisone  taper  - CRP is normal - Pulmonary rehab once at chronic stable state and consider inpatient rehab postdischarge - s/p TTE- Mild LVH, possible right to left shunting.     Thank you for allowing me to participate in the care of this patient.   Patient/Family are satisfied with care plan and all questions have been answered.    Provider disclosure: Patient with at least one acute or chronic illness or injury that poses a threat to life or bodily function and is being managed actively during this encounter.  All of the below services have been performed independently by signing provider:  review of prior documentation from internal and or external health records.  Review of previous and current lab results.  Interview and comprehensive assessment during patient visit today. Review  of current and previous chest radiographs/CT scans. Discussion of management and test interpretation with health care team and patient/family.   This document was prepared using Dragon voice recognition software and may include unintentional dictation errors.     Colt Martelle, M.D.  Division of Pulmonary & Critical Care Medicine

## 2024-09-15 ENCOUNTER — Ambulatory Visit: Payer: No Typology Code available for payment source

## 2024-09-15 ENCOUNTER — Other Ambulatory Visit: Payer: Self-pay

## 2024-09-15 DIAGNOSIS — J441 Chronic obstructive pulmonary disease with (acute) exacerbation: Secondary | ICD-10-CM | POA: Diagnosis not present

## 2024-09-15 LAB — CBC
HCT: 37.8 % (ref 36.0–46.0)
Hemoglobin: 12 g/dL (ref 12.0–15.0)
MCH: 31.8 pg (ref 26.0–34.0)
MCHC: 31.7 g/dL (ref 30.0–36.0)
MCV: 100.3 fL — ABNORMAL HIGH (ref 80.0–100.0)
Platelets: 295 K/uL (ref 150–400)
RBC: 3.77 MIL/uL — ABNORMAL LOW (ref 3.87–5.11)
RDW: 11.9 % (ref 11.5–15.5)
WBC: 9.6 K/uL (ref 4.0–10.5)
nRBC: 0 % (ref 0.0–0.2)

## 2024-09-15 LAB — BASIC METABOLIC PANEL WITH GFR
Anion gap: 11 (ref 5–15)
BUN: 23 mg/dL (ref 8–23)
CO2: 32 mmol/L (ref 22–32)
Calcium: 9.1 mg/dL (ref 8.9–10.3)
Chloride: 100 mmol/L (ref 98–111)
Creatinine, Ser: 0.54 mg/dL (ref 0.44–1.00)
GFR, Estimated: >60 mL/min (ref >60)
Glucose, Bld: 89 mg/dL (ref 70–99)
Potassium: 4.6 mmol/L (ref 3.5–5.1)
Sodium: 141 mmol/L (ref 135–145)

## 2024-09-15 MED ORDER — IPRATROPIUM-ALBUTEROL 0.5-2.5 (3) MG/3ML IN SOLN
3.0000 mL | Freq: Three times a day (TID) | RESPIRATORY_TRACT | 0 refills | Status: AC
  Filled 2024-09-15: qty 360, 40d supply, fill #0

## 2024-09-15 MED ORDER — BENZONATATE 100 MG PO CAPS
100.0000 mg | ORAL_CAPSULE | Freq: Three times a day (TID) | ORAL | 0 refills | Status: AC | PRN
  Filled 2024-09-15: qty 20, 7d supply, fill #0

## 2024-09-15 MED ORDER — LORAZEPAM 0.5 MG PO TABS
0.5000 mg | ORAL_TABLET | Freq: Three times a day (TID) | ORAL | 0 refills | Status: DC | PRN
  Filled 2024-09-15: qty 20, 7d supply, fill #0

## 2024-09-15 MED ORDER — QUETIAPINE FUMARATE 25 MG PO TABS
25.0000 mg | ORAL_TABLET | Freq: Every day | ORAL | 0 refills | Status: DC
  Filled 2024-09-15: qty 30, 30d supply, fill #0

## 2024-09-15 MED ORDER — ONDANSETRON HCL 4 MG PO TABS
4.0000 mg | ORAL_TABLET | Freq: Four times a day (QID) | ORAL | 0 refills | Status: AC | PRN
  Filled 2024-09-15: qty 10, 3d supply, fill #0

## 2024-09-15 MED ORDER — PREDNISONE 5 MG PO TABS
5.0000 mg | ORAL_TABLET | Freq: Every day | ORAL | 0 refills | Status: DC
  Filled 2024-09-15: qty 4, 4d supply, fill #0

## 2024-09-15 MED ORDER — PREDNISONE 5 MG PO TABS
ORAL_TABLET | ORAL | 0 refills | Status: AC
Start: 2024-09-16 — End: 2024-09-24
  Filled 2024-09-15: qty 36, 8d supply, fill #0

## 2024-09-15 MED ORDER — DOCUSATE SODIUM 100 MG PO CAPS
200.0000 mg | ORAL_CAPSULE | Freq: Two times a day (BID) | ORAL | 0 refills | Status: DC
  Filled 2024-09-15: qty 10, 3d supply, fill #0

## 2024-09-15 MED ORDER — LORAZEPAM 0.5 MG PO TABS
0.5000 mg | ORAL_TABLET | Freq: Three times a day (TID) | ORAL | Status: DC | PRN
  Administered 2024-09-15: 0.5 mg via ORAL
  Filled 2024-09-15: qty 1

## 2024-09-15 NOTE — Progress Notes (Signed)
 PROGRESS NOTE       Micole Delehanty            FMW:[id number] DOB: [birth year] DOA: 09/09/2024 PCP: Edman [name], DO               No notes on file   Brief Narrative:    [name] is a 68 y.o. 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 Hospitalist called for admission in the setting of COPD exacerbation.    Assessment & Plan:   Principal Problem:   COPD with acute exacerbation (HCC) Active Problems:   Former smoker   Hyperlipidemia   Generalized anxiety disorder with panic attacks   Insomnia   Assessment and Plan:   * COPD with acute exacerbation (HCC) Continue home oxygen  supplementation At baseline, patient uses 2 L nasal cannula At home past three 2 puff inhalation twice daily, montelukast  10 mg nightly DuoNebs 3 times daily Albuterol  nebulizer every 4 hours as needed for wheezing and shortness of breath Tessalon capsules, 100 mg p.o. 3 times daily as needed for cough   09/14/2024: Patient states she does not have BiPAP machine.  Discussed with pulmonologist who states that the patient does not need BiPAP therapy at this time.  Pulmonologist states that they can evaluate patient's respiratory status on an outpatient basis to determine if patient needs BiPAP therapy.   Insomnia Quetiapine 25 mg nightly resume   Generalized anxiety disorder with panic attacks Home buspirone  7.5 mg p.o. twice daily as needed for anxiety   Hyperlipidemia Ezetimibe  10 mg daily   DVT prophylaxis: Enoxaparin  Code Status: Full code Family Communication: Disposition Plan:  Level of care: Progressive   Consultants:  Pulmonology   Procedures:  Not indicated   Antimicrobials: Patient completed 5 days of doxycycline    Subjective: She states she feels better with BiPAP therapy.  She  states she does not have BiPAP machine at home.  States she is not ready to go home   Objective:       Vitals:    09/14/24 0436 09/14/24 0735 09/14/24 0756 09/14/24 1117  BP: 105/64 124/80   117/70  Pulse: (!) 102 (!) 108   (!) 113  Resp: 20 20   20   Temp: 97.7 F (36.5 C) 97.8 F (36.6 C)   98.6 F (37 C)  TempSrc:   Oral   Oral  SpO2: 98% 98% 96% 98%  Weight:          Height:              Intake/Output Summary (Last 24 hours) at 09/14/2024 1444 Last data filed at 09/14/2024 1300    Gross per 24 hour  Intake 480 ml  Output --  Net 480 ml       Filed Weights    09/09/24 0817  Weight: 54.6 kg    Examination:   General exam: Appears calm and comfortable  Respiratory system: BiPAP machine in place.  Clear to auscultation. Respiratory effort normal. Cardiovascular system: S1 & S2 heard, RRR. No JVD, murmurs, rubs, gallops or clicks. No pedal edema. Gastrointestinal system: Abdomen is nondistended, soft and nontender. No organomegaly or masses felt. Normal bowel sounds heard. Central nervous system: Alert and oriented. No focal neurological deficits. Extremities: Symmetric 5 x 5 power. Skin: No rashes, lesions or ulcers Psychiatry: Judgement and insight appear normal. Mood &  affect appropriate.    Data Reviewed: I have personally reviewed following labs and imaging studies   CBC: Last Labs         Recent Labs  Lab 09/10/24 0416 09/11/24 0526 09/12/24 0421 09/13/24 0517 09/14/24 0435  WBC 8.3 11.9* 9.4 10.3 10.1  HGB 11.7* 12.1 12.2 11.8* 11.9*  HCT 35.4* 37.0 37.0 36.1 37.6  MCV 95.9 97.6 96.9 98.9 100.0  PLT 253 291 295 295 285      Basic Metabolic Panel: Last Labs         Recent Labs  Lab 09/09/24 0825 09/11/24 0526 09/12/24 0421 09/13/24 0517 09/14/24 0435  NA 135 140 137 138 136  K 3.9 4.8 4.5 4.0 4.0  CL 94* 101 100 99 98  CO2 27 30 29 30 30   GLUCOSE 158* 127* 127* 90 89  BUN 15 29* 25* 26* 28*  CREATININE 0.59 0.55 0.57 0.63 0.52  CALCIUM   8.7* 9.0 8.7* 8.5* 8.7*      GFR: Estimated Creatinine Clearance: 58 mL/min (by C-G formula based on SCr of 0.52 mg/dL).   Liver Function Tests: Last Labs     Recent Labs  Lab 09/09/24 0825  AST 23  ALT 19  ALKPHOS 36*  BILITOT 0.6  PROT 6.7  ALBUMIN 3.7             Recent Results (from the past 240 hours)  Resp panel by RT-PCR (RSV, Flu A&B, Covid) Anterior Nasal Swab     Status: None    Collection Time: 09/09/24  8:25 AM    Specimen: Anterior Nasal Swab  Result Value Ref Range Status    SARS Coronavirus 2 by RT PCR NEGATIVE NEGATIVE Final      Comment: (NOTE) SARS-CoV-2 target nucleic acids are NOT DETECTED.   The SARS-CoV-2 RNA is generally detectable in upper respiratory specimens during the acute phase of infection. The lowest concentration of SARS-CoV-2 viral copies this assay can detect is 138 copies/mL. A negative result does not preclude SARS-Cov-2 infection and should not be used as the sole basis for treatment or other patient management decisions. A negative result may occur with  improper specimen collection/handling, submission of specimen other than nasopharyngeal swab, presence of viral mutation(s) within the areas targeted by this assay, and inadequate number of viral copies(<138 copies/mL). A negative result must be combined with clinical observations, patient history, and epidemiological information. The expected result is Negative.   Fact Sheet for Patients:  BloggerCourse.com   Fact Sheet for Healthcare Providers:  SeriousBroker.it   This test is no t yet approved or cleared by the [place]  FDA and  has been authorized for detection and/or diagnosis of SARS-CoV-2 by FDA under an Emergency Use Authorization (EUA). This EUA will remain  in effect (meaning this test can be used) for the duration of the COVID-19 declaration under Section 564(b)(1) of the Act, 21 U.S.C.section 360bbb-3(b)(1),  unless the authorization is terminated  or revoked sooner.           Influenza A by PCR NEGATIVE NEGATIVE Final    Influenza B by PCR NEGATIVE NEGATIVE Final      Comment: (NOTE) The Xpert Xpress SARS-CoV-2/FLU/RSV plus assay is intended as an aid in the diagnosis of influenza from Nasopharyngeal swab specimens and should not be used as a sole basis for treatment. Nasal washings and aspirates are unacceptable for Xpert Xpress SARS-CoV-2/FLU/RSV testing.   Fact Sheet for Patients: BloggerCourse.com   Fact Sheet for Healthcare Providers: SeriousBroker.it  This test is not yet approved or cleared by the [place]  FDA and has been authorized for detection and/or diagnosis of SARS-CoV-2 by FDA under an Emergency Use Authorization (EUA). This EUA will remain in effect (meaning this test can be used) for the duration of the COVID-19 declaration under Section 564(b)(1) of the Act, 21 U.S.C. section 360bbb-3(b)(1), unless the authorization is terminated or revoked.        Resp Syncytial Virus by PCR NEGATIVE NEGATIVE Final      Comment: (NOTE) Fact Sheet for Patients: BloggerCourse.com   Fact Sheet for Healthcare Providers: SeriousBroker.it   This test is not yet approved or cleared by the [place]  FDA and has been authorized for detection and/or diagnosis of SARS-CoV-2 by FDA under an Emergency Use Authorization (EUA). This EUA will remain in effect (meaning this test can be used) for the duration of the COVID-19 declaration under Section 564(b)(1) of the Act, 21 U.S.C. section 360bbb-3(b)(1), unless the authorization is terminated or revoked.   Performed at [facility], [address] [place], [place] [address]    Respiratory (~20 pathogens) panel by PCR     Status: None    Collection Time: 09/09/24 10:39 AM    Specimen: Nasopharyngeal Swab; Respiratory   Result Value Ref Range Status    Adenovirus NOT DETECTED NOT DETECTED Final    Coronavirus 229E NOT DETECTED NOT DETECTED Final      Comment: (NOTE) The Coronavirus on the Respiratory Panel, DOES NOT test for the novel  Coronavirus (2019 nCoV)      Coronavirus HKU1 NOT DETECTED NOT DETECTED Final    Coronavirus NL63 NOT DETECTED NOT DETECTED Final    Coronavirus OC43 NOT DETECTED NOT DETECTED Final    Metapneumovirus NOT DETECTED NOT DETECTED Final    Rhinovirus / Enterovirus NOT DETECTED NOT DETECTED Final    Influenza A NOT DETECTED NOT DETECTED Final    Influenza B NOT DETECTED NOT DETECTED Final    Parainfluenza Virus 1 NOT DETECTED NOT DETECTED Final    Parainfluenza Virus 2 NOT DETECTED NOT DETECTED Final    Parainfluenza Virus 3 NOT DETECTED NOT DETECTED Final    Parainfluenza Virus 4 NOT DETECTED NOT DETECTED Final    Respiratory Syncytial Virus NOT DETECTED NOT DETECTED Final    Bordetella pertussis NOT DETECTED NOT DETECTED Final    Bordetella Parapertussis NOT DETECTED NOT DETECTED Final    Chlamydophila pneumoniae NOT DETECTED NOT DETECTED Final    Mycoplasma pneumoniae NOT DETECTED NOT DETECTED Final      Comment: Performed at [facility], [address] [place], [place] [address]  Expectorated Sputum Assessment w Gram Stain, Rflx to Resp Cult     Status: None    Collection Time: 09/09/24 11:05 PM    Specimen: Expectorated Sputum  Result Value Ref Range Status    Specimen Description EXPECTORATED SPUTUM   Final    Special Requests NONE   Final    Sputum evaluation     Final      Sputum specimen not acceptable for testing.  Please recollect.   Performed at [facility], [address] [place], [place] [address]      Report Status 09/10/2024 FINAL   Final    Radiology Studies:  Imaging Results (Last 48 hours)  DG Chest Port 1 View Result Date: 09/12/2024 EXAM: 1 VIEW(S) XRAY OF THE CHEST 09/12/2024 04:51:00 PM COMPARISON: 09/09/2024 CLINICAL  HISTORY: Atelectasis FINDINGS: LUNGS AND PLEURA: Stable emphysema. No focal  pulmonary opacity. No pulmonary edema. No pleural effusion. No pneumothorax. HEART AND MEDIASTINUM: Aortic atherosclerosis. Unchanged heart size and mediastinal contours. BONES AND SOFT TISSUES: No acute osseous abnormality. IMPRESSION: 1. Stable emphysema. No acute pulmonary process. 2. Aortic atherosclerosis. Electronically signed by: Andrea Gasman MD 09/12/2024 08:20 PM EDT RP Workstation: [id number]    ECHOCARDIOGRAM COMPLETE BUBBLE STUDY Result Date: 09/12/2024    ECHOCARDIOGRAM REPORT   Patient Name:   Brandi [name] Date of Exam: 09/12/2024 Medical Rec #:  [id number]     Height:       64.0 in Accession #:    [id number]    Weight:       120.4 lb Date of Birth:  [birth year]    BSA:          1.577 m Patient Age:    67 years      BP:           124/78 mmHg Patient Gender: F             HR:           97 bpm. Exam Location:  [facility] Procedure: 2D Echo, Color Doppler, Cardiac Doppler and Saline Contrast Bubble            Study (Both Spectral and Color Flow Doppler were utilized during            procedure). Indications:     Diastolic CHF  History:         Patient has prior history of Echocardiogram examinations, most                  recent 08/16/2021. COPD; Risk Factors:Dyslipidemia. Anxiety.  Sonographer:     Christopher Furnace Referring Phys:  [id number] FUAD ALESKEROV Diagnosing Phys: Lonni Hanson MD  Sonographer Comments: Technically challenging study due to limited acoustic windows, suboptimal apical window and suboptimal parasternal window. Image acquisition challenging due to COPD. IMPRESSIONS  1. Left ventricular ejection fraction, by estimation, is 65 to 70%. The left ventricle has normal function. Left ventricular endocardial border not optimally defined to evaluate regional wall motion. There is mild left ventricular hypertrophy. Indeterminate diastolic filling due to E-A fusion.  2. Right ventricular systolic function is normal. The right  ventricular size is normal. Mildly increased right ventricular wall thickness. Tricuspid regurgitation signal is inadequate for assessing PA pressure.  3. The mitral valve is grossly normal. No evidence of mitral valve regurgitation. No evidence of mitral stenosis.  4. The aortic valve was not well visualized. Aortic valve regurgitation is not visualized. No aortic stenosis is present.  5. The inferior vena cava is normal in size with greater than 50% respiratory variability, suggesting right atrial pressure of 3 mmHg.  6. Bubble study does not show a large right-to-left shunt but cannot exclude a small intracardiac shunt due to suboptimal windows. FINDINGS  Left Ventricle: Left ventricular ejection fraction, by estimation, is 65 to 70%. The left ventricle has normal function. Left ventricular endocardial border not optimally defined to evaluate regional wall motion. The left ventricular internal cavity size was normal in size. There is mild left ventricular hypertrophy. Indeterminate diastolic filling due to E-A fusion. Right Ventricle: The right ventricular size is normal. Mildly increased right ventricular wall thickness. Right ventricular systolic function is normal. Tricuspid regurgitation signal is inadequate for assessing PA pressure. Left Atrium: Left atrial size was normal in size. Right Atrium: Right atrial size was normal in size. Pericardium: There is no evidence of pericardial effusion. Presence of epicardial fat  layer. Mitral Valve: The mitral valve is grossly normal. No evidence of mitral valve regurgitation. No evidence of mitral valve stenosis. Tricuspid Valve: The tricuspid valve is grossly normal. Tricuspid valve regurgitation is trivial. Aortic Valve: The aortic valve was not well visualized. Aortic valve regurgitation is not visualized. No aortic stenosis is present. Aortic valve mean gradient measures 2.5 mmHg. Aortic valve peak gradient measures 4.0 mmHg. Aortic valve area, by VTI measures  3.01 cm. Pulmonic Valve: The pulmonic valve was not well visualized. Pulmonic valve regurgitation is not visualized. No evidence of pulmonic stenosis. Aorta: The aortic root is normal in size and structure. Pulmonary Artery: The pulmonary artery is not well seen. Venous: The inferior vena cava is normal in size with greater than 50% respiratory variability, suggesting right atrial pressure of 3 mmHg. IAS/Shunts: No atrial level shunt detected by color flow Doppler. Agitated saline contrast was given intravenously to evaluate for intracardiac shunting. Bubble study does not show a large right-to-left shunt but cannot exclude a small intracardiac shunt  due to suboptimal windows.  LEFT VENTRICLE PLAX 2D LVIDd:         3.00 cm   Diastology LVIDs:         2.10 cm   LV e' medial:    6.42 cm/s LV PW:         1.10 cm   LV E/e' medial:  9.1 LV IVS:        1.00 cm   LV e' lateral:   13.70 cm/s LVOT diam:     2.00 cm   LV E/e' lateral: 4.3 LV SV:         42 LV SV Index:   27 LVOT Area:     3.14 cm  RIGHT VENTRICLE RV Basal diam:  3.30 cm RV Mid diam:    2.60 cm LEFT ATRIUM           Index       RIGHT ATRIUM           Index LA diam:      2.20 cm 1.40 cm/m  RA Area:     12.70 cm LA Vol (A2C): 14.0 ml 8.88 ml/m  RA Volume:   31.10 ml  19.73 ml/m LA Vol (A4C): 4.5 ml  2.84 ml/m  AORTIC VALVE AV Area (Vmax):    2.68 cm AV Area (Vmean):   2.86 cm AV Area (VTI):     3.01 cm AV Vmax:           100.10 cm/s AV Vmean:          69.750 cm/s AV VTI:            0.140 m AV Peak Grad:      4.0 mmHg AV Mean Grad:      2.5 mmHg LVOT Vmax:         85.40 cm/s LVOT Vmean:        63.500 cm/s LVOT VTI:          0.134 m LVOT/AV VTI ratio: 0.96  AORTA Ao Root diam: 2.60 cm MITRAL VALVE MV Area (PHT): 7.66 cm    SHUNTS MV Decel Time: 99 msec     Systemic VTI:  0.13 m MV E velocity: 58.70 cm/s  Systemic Diam: 2.00 cm MV A velocity: 90.10 cm/s MV E/A ratio:  0.65 Lonni End MD Electronically signed by Lonni Hanson MD Signature  Date/Time: 09/12/2024/5:58:57 PM    Final      Scheduled Meds:  aspirin   EC  81 mg Oral Daily   budesonide-glycopyrrolate -formoterol  2 puff Inhalation BID   docusate sodium  200 mg Oral BID   enoxaparin  (LOVENOX ) injection  40 mg Subcutaneous Q24H   ezetimibe   10 mg Oral Daily   feeding supplement  237 mL Oral BID BM   ipratropium-albuterol   3 mL Nebulization TID   melatonin  5 mg Oral QHS   montelukast   10 mg Oral QHS   mouth rinse  15 mL Mouth Rinse 4 times per day   pantoprazole  40 mg Oral Daily   [START ON 09/15/2024] predniSONE   45 mg Oral Q breakfast    Followed by   NOREEN ON 09/16/2024] predniSONE   40 mg Oral Q breakfast    Followed by   NOREEN ON 09/17/2024] predniSONE   35 mg Oral Q breakfast    Followed by   NOREEN ON 09/18/2024] predniSONE   30 mg Oral Q breakfast    Followed by   NOREEN ON 09/19/2024] predniSONE   25 mg Oral Q breakfast    Followed by   NOREEN ON 09/20/2024] predniSONE   20 mg Oral Q breakfast    Followed by   NOREEN ON 09/21/2024] predniSONE   15 mg Oral Q breakfast    Followed by   NOREEN ON 09/22/2024] predniSONE   10 mg Oral Q breakfast    Followed by   NOREEN ON 09/23/2024] predniSONE   5 mg Oral Q breakfast   QUEtiapine  25 mg Oral QHS        Continuous Infusions:      LOS: 5 days    Time spent: 5   Dr. Sherre [facility]   If 7PM-7AM, please contact night-coverage   09/14/2024, 2:44 PM

## 2024-09-15 NOTE — TOC Transition Note (Signed)
 Transition of Care [facility]) - Discharge Note   Patient Details  Name: Brandi [name] MRN: [id number] Date of Birth: 08-Sep-1956  [facility] CM/SW Contact:  Lauraine JAYSON Carpen, LCSW Phone Number: 09/15/2024, 11:58 AM   Clinical Narrative: Patient has orders to discharge home today. [facility] liaison is aware. Patient requested nebulizer machine. [employer] liaison is aware. No further concerns. Per attending physician, daughter will be here around 1:00 to pick her up. CSW signing off.    Final next level of care: Home w Home Health Services Barriers to Discharge: Barriers Resolved   Patient Goals and CMS Choice   CMS Medicare.gov Compare Post Acute Care list provided to:: Patient Choice offered to / list presented to : Patient      Discharge Placement                Patient to be transferred to facility by: Daughter   Patient and family notified of of transfer: 09/15/24  Discharge Plan and Services Additional resources added to the After Visit Summary for       Post Acute Care Choice: Home Health, Durable Medical Equipment          DME Arranged: Walker rolling with seat, 3-N-1, Nebulizer machine DME Agency: [facility] Date DME Agency Contacted: 09/15/24   Representative spoke with at DME Agency: [facility] HH Arranged: RN, PT, OT, Nurse's Aide HH Agency: [facility] Date HH Agency Contacted: 09/15/24   Representative spoke with at [facility] Agency: Shaun  Social Drivers of Health (SDOH) Interventions SDOH Screenings   Food Insecurity: No Food Insecurity (09/09/2024)  Recent Concern: Food Insecurity - Food Insecurity Present (08/03/2024)   [facility]  Housing: Low Risk  (09/09/2024)  Transportation Needs: No Transportation Needs (09/09/2024)  Utilities: Not At Risk (09/09/2024)  Alcohol Screen: Low Risk  (09/09/2023)  Depression (PHQ2-9): Low Risk  (09/01/2024)  Financial Resource Strain: Medium Risk  (08/03/2024)   Received from [facility]  Physical Activity: Sufficiently Active (09/09/2023)  Social Connections: Moderately Isolated (09/09/2024)  Stress: No Stress Concern Present (05/09/2024)  Tobacco Use: Medium Risk (09/14/2024)  Health Literacy: Adequate Health Literacy (05/09/2024)     Readmission Risk Interventions     No data to display

## 2024-09-15 NOTE — TOC CM/SW Note (Signed)
 Patient is not able to walk the distance required to go the bathroom, or he/she is unable to safely negotiate stairs required to access the bathroom.  A 3in1 BSC will alleviate this problem

## 2024-09-15 NOTE — Progress Notes (Signed)
 PT Cancellation Note  Patient Details Name: Brandi [name] MRN: [id number] DOB: 12-25-1955   Cancelled Treatment:     Pt observed ambulating around the unit with mobility specialist. Sao2 staying > 96% on baseline O2. Acute PT will continue to follow and progress per current POC. DC recs remain appropriate.    Rankin KATHEE Essex 09/15/2024, 10:04 AM

## 2024-09-15 NOTE — TOC Progression Note (Signed)
 Transition of Care [facility]) - Progression Note    Patient Details  Name: Brandi [name] MRN: [id number] Date of Birth: 04/10/1956  [facility]) CM/SW Contact  Lauraine JAYSON Carpen, LCSW Phone Number: 09/15/2024, 11:23 AM  Clinical Narrative:  CSW spoke to PT. Patient is appropriate for home health. [facility] and [facility] are able to accept her. Reviewed CMS scores and patient's first preference is [facility]. They can provide PT, OT, RN, aide. CSW ordered rollator and 3-in-1 through [facility]. Patient gets her oxygen  through [facility]. Patient said she has a portable oxygen  concentrator but she is asking for the one that weighs 2 lbs. [facility] liaison will check to see if they can provide this.  Expected Discharge Plan: Home w Home Health Services Barriers to Discharge: Continued Medical Work up               Expected Discharge Plan and Services     Post Acute Care Choice: Home Health, Durable Medical Equipment Living arrangements for the past 2 months: Single Family Home                                       Social Drivers of Health (SDOH) Interventions SDOH Screenings   Food Insecurity: No Food Insecurity (09/09/2024)  Recent Concern: Food Insecurity - Food Insecurity Present (08/03/2024)   Received from [facility]  Housing: Low Risk  (09/09/2024)  Transportation Needs: No Transportation Needs (09/09/2024)  Utilities: Not At Risk (09/09/2024)  Alcohol Screen: Low Risk  (09/09/2023)  Depression (PHQ2-9): Low Risk  (09/01/2024)  Financial Resource Strain: Medium Risk (08/03/2024)   Received from [facility]  Physical Activity: Sufficiently Active (09/09/2023)  Social Connections: Moderately Isolated (09/09/2024)  Stress: No Stress Concern Present (05/09/2024)  Tobacco Use: Medium Risk (09/14/2024)  Health Literacy: Adequate Health Literacy (05/09/2024)    Readmission Risk Interventions     No data to display

## 2024-09-15 NOTE — Progress Notes (Signed)
 Mobility Specialist - Progress Note Pre-mobility: HR-110, SpO2-89%  During mobility: HR-113,  SpO2-92%  Post-mobility: HR-100, , SPO2-93%   09/15/24 1000  Oxygen  Therapy  SpO2 100 %  O2 Device Nasal Cannula  O2 Flow Rate (L/min) 4 L/min  Mobility  Activity Ambulated with assistance  Level of Assistance Contact guard assist, steadying assist  Assistive Device Four wheel walker  Distance Ambulated (ft) 360 ft  Range of Motion/Exercises Active  Activity Response Tolerated well  Mobility visit 1 Mobility  Mobility Specialist Start Time (ACUTE ONLY) [id number]  Mobility Specialist Stop Time (ACUTE ONLY) 1006  Mobility Specialist Time Calculation (min) (ACUTE ONLY) 28 min   Pt was supine in bed upon entry. Pt agreed to mobility. Pt is on O2 @ 4L continuous. Before activity pt had a BM on the [facility] independently with a steady guard. After BM pt was able to STS independently with a steady guard for precaution. Pt ambulated well with a 4 WW and 4l continuous. Pt O2 levels during activity remained above 91%. Pt was able to maintain conversation and cognitive ability throughout activity. Pt used recovery breaks due to LE soreness. After activity pt returned to the room in bed with needs in close reach.  Clem Rodes Mobility Specialist 09/15/24, 10:24 AM

## 2024-09-15 NOTE — Assessment & Plan Note (Signed)
 Patient can be seen by pulmonologist outpatient and a referral to pulmonary rehab can be done on an outpatient basis.    Patient cannot have home health aide, home health PT, OT and get an order for pulmonary rehab at the same time.  I discussed this with TOC specialist

## 2024-09-15 NOTE — Plan of Care (Signed)

## 2024-09-15 NOTE — Significant Event (Signed)
 No charge note.  I was messaged by RN that patient insists on seeing me at bedside.  I presented to bedside.  Patient does not appear to be in acute distress.  She then tells the nurse that she insist on getting her oxygen  saturation checked.  The nurse brought the equipment to check her oxygen  saturation and it is 97% on her baseline 4 L nasal cannula.  Patient then proceeded to yell at me stating that she cannot believe that she is being discharged from the hospital only since finding out that she has severe COPD and that there is only 75% of her lungs left.  She states that yesterday somebody told her that she may require lung transplant and that she is very upset at me for discharging her today.  Patient raises her voice and states that she does not like me.  During this entire time, we reeducated patient that she does not meet inpatient criteria.  She is getting her nebulizer treatments as appropriate.  TOC and RN were at bedside.  DME's have been ordered and that she will get these prior to discharge.  Pharmacy has delivered medications to bedside.  While patient appears agitated and angry at me, she does not appear to be in acute distress or having COPD exacerbation.   Patient is stable and I feel confident the best place for patient on her journey to maintain the best health possible is at home.   Dr. Sherre

## 2024-09-15 NOTE — Progress Notes (Signed)
 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  She also has a background of dyslipidemia.  She has been on inhaler therapy for COPD and asthma as well as recent introduction of prednisone  at 5 mg/day with Bactrim  thrice weekly.  She felt initially improved but reports approximately 1 week ago onset of chest discomfort with worsening hypoxemia.  On admission she had a viral workup done and is negative for 20 species respiratory viral panel as well as negative for RSV/COVID/flu.  She is being treated for acute exacerbation of asthma and COPD with Solu-Medrol  40 mg as well as nebulizer therapy and her home Breztri  twice daily she also has rhinitis and is taking Singulair  for this which has been continued.  09/12/24-  patient is improved today.  She is closes to baseline.  Her wheezing is almost completely resolved although she shares there was a bad spell this morning.  She is on solumedrol 40 IV bid and I have reduced this to once daily now.  She is on Breztri  BID, Duoneb and albuterol . She is on doxycycline  100 bid with a course to be completed for 7d course.   09/14/24- patient seen at bedside.  She reports ongoing dyspnea and shares she does not wish to go home since she has no one to help take care of her. She is now at 4L/min [place] home setting and is with advanced stage 4 COPD.  She may benefit from palliative evaluation.   09/14/24- patient is on 4L/min.  Daughter at bedside. She wants to have someone take care of her at home and requests respiratory therapist to come to her  home and provide therapy. I have explained that she may qualify for home health but were not able to provide Metane[facility] therapy at home.  She would benefit from lung transplant evaluation.    PFT  ponent Ref Range & Units (hover) 5 mo ago 1 yr ago  FVC-Pre 1.91 2.44  FVC-%Pred-Pre 62 78  FVC-Post 2.35 2.67  FVC-%Pred-Post 76 86  FVC-%Change-Post 22 9  FEV1-Pre 0.75 1.00  FEV1-%Pred-Pre 32 42  FEV1-Post 1.04 1.18  FEV1-%Pred-Post 44 50  FEV1-%Change-Post 38 17  FEV6-Pre 1.72 2.19  FEV6-%Pred-Pre 58 73  FEV6-Post 2.21 2.50  FEV6-%Pred-Post 75 84  FEV6-%Change-Post 28 13  Pre FEV1/FVC ratio 39 41  FEV1FVC-%Pred-Pre 51 53  Post FEV1/FVC ratio 44 44  FEV1FVC-%Change-Post 13 7  Pre FEV6/FVC Ratio 90 90  FEV6FVC-%Pred-Pre 93 93  Post FEV6/FVC ratio 94 94  FEV6FVC-%Pred-Post 98 97  FEV6FVC-%Change-Post 4 4  FEF 25-75 Pre 0.26 0.37  FEF2575-%Pred-Pre 13 18  FEF 25-75 Post 0.57 0.54  FEF2575-%Pred-Post 28 26  FEF2575-%Change-Post 115 45  RV 4.41 3.69  RV % pred 205 173  TLC 6.98 6.30  TLC % pred 138 124  DLCO unc 9.04 6.69  DLCO unc % pred 46 34  DL/VA 7.77 8.44  DL/VA % pred 53 37  Resulting Agency [facility]  Specimen Collected: 04/05/24 13:56 Last Resulted: 04/05/24 14:41        PAST MEDICAL HISTORY   Past Medical History:  Diagnosis Date   Anxiety    COPD (chronic obstructive pulmonary disease) (HCC)    Hyperlipidemia      SURGICAL HISTORY   Past Surgical History:  Procedure Laterality Date   APPENDECTOMY     CHOLECYSTECTOMY     SHOULDER ARTHROSCOPY Left 07/02/2015   Procedure: ,left shoulder arthroscopy, decompression and debridement;  Surgeon: Norleen JINNY Maltos, MD;  Location: [facility];  Service: Orthopedics;  Laterality: Left;   SHOULDER CLOSED REDUCTION Left 04/09/2015   Procedure: CLOSED MANIPULATION SHOULDER;  Surgeon: Ozell Flake, MD;  Location: [facility];  Service: Orthopedics;  Laterality: Left;   TUBAL LIGATION       FAMILY  HISTORY   Family History  Problem Relation Age of Onset   Breast cancer Mother 60   Alzheimer's disease Mother      SOCIAL HISTORY   Social History   Tobacco Use   Smoking status: Former    Current packs/day: 0.00    Average packs/day: 0.8 packs/day for 41.0 years (30.8 ttl pk-yrs)    Types: Cigarettes    Start date: 12/07/1974    Quit date: 12/08/2015    Years since quitting: 8.7   Smokeless tobacco: Never  Vaping Use   Vaping status: Never Used  Substance Use Topics   Alcohol use: No   Drug use: No     MEDICATIONS    Home Medication:  Current Outpatient Rx   [START ON 09/16/2024] Order #: [id number] Class: Normal   Order #: [id number] Class: Normal   Order #: [id number] Class: Normal   Order #: [id number] Class: Normal   Order #: [id number] Class: Normal   Order #: [id number] Class: Normal   Order #: [id number] Class: Normal    Current Medication:  Current Facility-Administered Medications:    albuterol  (PROVENTIL ) (2.5 MG/3ML) 0.083% nebulizer solution 3 mL, 3 mL, Inhalation, Q4H PRN, Laurita Manor T, MD, 3 mL at 09/14/24 1137   aspirin  EC tablet 81 mg, 81 mg, Oral, Daily, Laurita Manor T, MD, 81 mg at 09/15/24 9090   benzonatate (TESSALON) capsule 100 mg, 100 mg, Oral, TID PRN, Laurita Manor DASEN, MD   budesonide-glycopyrrolate -formoterol (BREZTRI ) 160-9-4.8 MCG/ACT inhaler 2 puff, 2 puff, Inhalation, BID, Zhang, Ping T, MD, 2 puff at 09/15/24 9085   busPIRone  (BUSPAR ) tablet 7.5 mg, 7.5 mg, Oral, BID PRN, Laurita Manor T, MD, 7.5 mg at 09/15/24 0907   docusate sodium (COLACE) capsule 200 mg, 200 mg, Oral, BID, Trudy Anthony HERO, MD, 200 mg at 09/15/24 9091   enoxaparin  (LOVENOX ) injection 40 mg, 40 mg, Subcutaneous, Q24H, Zhang, Ping T, MD, 40 mg at 09/14/24 2159   ezetimibe  (ZETIA ) tablet 10 mg, 10 mg, Oral, Daily, Laurita Manor T, MD, 10 mg at 09/15/24 9091   famotidine  (PEPCID ) tablet 20 mg, 20 mg, Oral, BID PRN, Laurita Manor DASEN, MD   feeding supplement (ENSURE ENLIVE / ENSURE PLUS)  liquid 237 mL, 237 mL, Oral, BID BM, Laurita Manor T, MD, 237 mL at 09/15/24 0914   ipratropium-albuterol  (DUONEB) 0.5-2.5 (3) MG/3ML nebulizer solution 3 mL, 3 mL, Nebulization, TID, Savier Trickett, MD, 3 mL at 09/15/24 0742   LORazepam (ATIVAN) tablet 0.5 mg, 0.5 mg, Oral, Q8H PRN, Cox, Amy N, DO, 0.5 mg at 09/15/24 1215   melatonin tablet 5 mg, 5 mg, Oral, QHS, Cleatus Hoof V, MD, 5 mg at 09/14/24 2200   metoprolol tartrate (LOPRESSOR) injection 5 mg,  5 mg, Intravenous, Q6H PRN, Trudy Anthony HERO, MD   montelukast  (SINGULAIR ) tablet 10 mg, 10 mg, Oral, QHS, Zhang, Ping T, MD, 10 mg at 09/14/24 2200   ondansetron  (ZOFRAN ) tablet 4 mg, 4 mg, Oral, Q6H PRN **OR** ondansetron  (ZOFRAN ) injection 4 mg, 4 mg, Intravenous, Q6H PRN, Laurita Cort DASEN, MD   Oral care mouth rinse, 15 mL, Mouth Rinse, 4 times per day, Laurita Cort T, MD, 15 mL at 09/15/24 0915   Oral care mouth rinse, 15 mL, Mouth Rinse, PRN, Laurita Cort DASEN, MD   pantoprazole (PROTONIX) EC tablet 40 mg, 40 mg, Oral, Daily, Zhang, Ping T, MD, 40 mg at 09/15/24 0908   polyethylene glycol (MIRALAX / GLYCOLAX) packet 17 g, 17 g, Oral, Daily PRN, Trudy Anthony HERO, MD   [COMPLETED] predniSONE  (DELTASONE ) tablet 50 mg, 50 mg, Oral, Q breakfast, 50 mg at 09/14/24 0817 **FOLLOWED BY** [COMPLETED] predniSONE  (DELTASONE ) tablet 45 mg, 45 mg, Oral, Q breakfast, 45 mg at 09/15/24 0911 **FOLLOWED BY** [START ON 09/16/2024] predniSONE  (DELTASONE ) tablet 40 mg, 40 mg, Oral, Q breakfast **FOLLOWED BY** [START ON 09/17/2024] predniSONE  (DELTASONE ) tablet 35 mg, 35 mg, Oral, Q breakfast **FOLLOWED BY** [START ON 09/18/2024] predniSONE  (DELTASONE ) tablet 30 mg, 30 mg, Oral, Q breakfast **FOLLOWED BY** [START ON 09/19/2024] predniSONE  (DELTASONE ) tablet 25 mg, 25 mg, Oral, Q breakfast **FOLLOWED BY** [START ON 09/20/2024] predniSONE  (DELTASONE ) tablet 20 mg, 20 mg, Oral, Q breakfast **FOLLOWED BY** [START ON 09/21/2024] predniSONE  (DELTASONE ) tablet 15 mg, 15 mg, Oral,  Q breakfast **FOLLOWED BY** [START ON 09/22/2024] predniSONE  (DELTASONE ) tablet 10 mg, 10 mg, Oral, Q breakfast **FOLLOWED BY** [START ON 09/23/2024] predniSONE  (DELTASONE ) tablet 5 mg, 5 mg, Oral, Q breakfast, Brandi Solanki, MD   QUEtiapine (SEROQUEL) tablet 25 mg, 25 mg, Oral, QHS, Zhang, Ping T, MD, 25 mg at 09/14/24 2200   sodium chloride  (OCEAN) 0.65 % nasal spray 1 spray, 1 spray, Each Nare, PRN, Trudy Anthony HERO, MD, 1 spray at 09/14/24 0818   tiZANidine  (ZANAFLEX ) tablet 2 mg, 2 mg, Oral, Q8H PRN, Laurita Cort DASEN, MD    ALLERGIES   Patient has no known allergies.     REVIEW OF SYSTEMS    Review of Systems:  Gen:  Denies  fever, sweats, chills weigh loss  HEENT: Denies blurred vision, double vision, ear pain, eye pain, hearing loss, nose bleeds, sore throat Cardiac:  No dizziness, chest pain or heaviness, chest tightness,edema Resp:   reports dyspnea chronically  Gi: Denies swallowing difficulty, stomach pain, nausea or vomiting, diarrhea, constipation, bowel incontinence Gu:  Denies bladder incontinence, burning urine Ext:   Denies Joint pain, stiffness or swelling Skin: Denies  skin rash, easy bruising or bleeding or hives Endoc:  Denies polyuria, polydipsia , polyphagia or weight change Psych:   Denies depression, insomnia or hallucinations   Other:  All other systems negative   VS: BP 132/79 (BP Location: Left Arm)   Pulse (!) 119   Temp 99 F (37.2 C)   Resp 17   Ht 5' 4 (1.626 m)   Wt 54.6 kg   SpO2 93%   BMI 20.66 kg/m      PHYSICAL EXAM    GENERAL:NAD, no fevers, chills, no weakness no fatigue HEAD: Normocephalic, atraumatic.  EYES: Pupils equal, round, reactive to light. Extraocular muscles intact. No scleral icterus.  MOUTH: Moist mucosal membrane. Dentition intact. No abscess noted.  EAR, NOSE, THROAT: Clear without exudates. No external lesions.  NECK: Supple. No thyromegaly. No nodules. No JVD.  PULMONARY: wheezing is reduced and  improved.  CARDIOVASCULAR: S1 and S2. Regular rate and rhythm. No murmurs, rubs, or gallops. No edema. Pedal pulses 2+ bilaterally.  GASTROINTESTINAL: Soft, nontender, nondistended. No masses. Positive bowel sounds. No hepatosplenomegaly.  MUSCULOSKELETAL: No swelling, clubbing, or edema. Range of motion full in all extremities.  NEUROLOGIC: Cranial nerves II through XII are intact. No gross focal neurological deficits. Sensation intact. Reflexes intact.  SKIN: No ulceration, lesions, rashes, or cyanosis. Skin warm and dry. Turgor intact.  PSYCHIATRIC: Mood, affect within normal limits. The patient is awake, alert and oriented x 3. Insight, judgment intact.       IMAGING   @IMAGES @  Narrative & Impression EXAM: 1 VIEW(S) XRAY OF THE CHEST 09/09/2024 08:31:00 AM   COMPARISON: 07/18/2024   CLINICAL HISTORY: sob, copd. Table formatting from the original note was not included.; patient coming in from home SOB for 2 weeks. Patient coming in on c-pap via EMS. ; hx of asthma, copd,. inital o2 67% on 4L [place] that she wears at home. patient was blue and mottled with ac3essory muscle use. 5 mg allbuterol, 2mg  mag, 125mg  solumed. Lung sounds were absent for EMS   FINDINGS:   LUNGS AND PLEURA: Hyperinflation with emphysema. No focal pulmonary opacity. No pulmonary edema. No pleural effusion. No pneumothorax.   HEART AND MEDIASTINUM: Aortic atherosclerosis. No acute abnormality of the cardiac and mediastinal silhouettes.   BONES AND SOFT TISSUES: No acute osseous abnormality.   IMPRESSION: 1. Hyperinflation with emphysema, consistent with COPD. 2. Aortic atherosclerosis.   Electronically signed by: Evalene Coho MD 09/09/2024 08:38 AM EDT RP Workstation: [id number]     ASSESSMENT/PLAN   Acute exacerbation of asthma and COPD overlap (ACOS) Continue Singulair  10 mg p.o. nightly - Continue Breztri  Aerosphere twice daily - prednisone  taper  - CRP is normal - Pulmonary rehab  once at chronic stable state and consider inpatient rehab postdischarge - s/p TTE- Mild LVH, possible right to left shunting. -patient is close to baseline but with advanced stage 4 COPD and likely needs to have Lung transplant evaluation    Thank you for allowing me to participate in the care of this patient.   Patient/Family are satisfied with care plan and all questions have been answered.    Provider disclosure: Patient with at least one acute or chronic illness or injury that poses a threat to life or bodily function and is being managed actively during this encounter.  All of the below services have been performed independently by signing provider:  review of prior documentation from internal and or external health records.  Review of previous and current lab results.  Interview and comprehensive assessment during patient visit today. Review of current and previous chest radiographs/CT scans. Discussion of management and test interpretation with health care team and patient/family.   This document was prepared using Dragon voice recognition software and may include unintentional dictation errors.     Brandi [name], M.D.  Division of Pulmonary & Critical Care Medicine

## 2024-09-15 NOTE — Discharge Summary (Addendum)
 Physician Discharge Summary   Patient: Brandi [name] MRN: [id number] DOB: 11-04-1956  Admit date:     09/09/2024  Discharge date: 09/15/24  Discharge Physician: Greig SAILOR Beautifull Cisar   PCP: Edman [name], DO   Recommendations at discharge:   Use 3in1 BSC ordered Follow-up closely with your pulmonary doctor for pulmonary function test for evaluation of BiPAP requirement and/or referral to transfer center. Use scheduled inhalers and continuous oxygen  4 L as prescribed, I have ensured that home scheduled inhaler medications have refills. Use as needed inhaler and anxiety medication as appropriate, nebulizer and inhaler medications have been ensured to have refills.  There will be no anxiety medication refilled on my prescription.  You will need to discuss with your PCP for anxiety medication refills.  Discharge Diagnoses: Principal Problem:   COPD with acute exacerbation (HCC) Active Problems:   Former smoker   Hyperlipidemia   Centrilobular emphysema (HCC)   Generalized anxiety disorder with panic attacks   Insomnia  Resolved Problems:   COPD exacerbation [facility])  Hospital Course:  Brandi [name] is a 68 year old female with history significant for severe COPD with chronic hypoxia requiring baseline 4 L nasal cannula and chronic steroid use, anxiety, depression, hyperlipidemia, who presented on 09/09/2024 for chief concerns of worsening, cough, shortness of breath and wheezing.  Patient was admitted for COPD exacerbation.  Please review progress note and H&P for full hospital course.  Briefly, on day of discharge, patient again states that she does not want to go home because she still feels she is weak.  I discussed extensively with patient at bedside that she is able to ambulate around the nursing station with some assistance and a rollator walker.  Functionally and from a musculoskeletal standpoint, patient is at baseline and requires rollator walker.  Over the record DME has  been ordered for the patient.  Patient appears anxious and I reiterated that currently she is not on any IV medications or treatments that could not be provided outpatient at home.  At patient's request, I have ordered RN, aide, PT, OT as home health orders for her.  In addition, I have provided for anxiety medication as needed, no refills.  I discussed extensively with daughter Brandi [name] who states she will come to pick up the patient at approximately 1 PM today.  Patient also brought up BiPAP and I discussed with patient that I have talked with her pulmonologist, Dr. Parris extensively.  Her pulmonologist states that she does not require BiPAP at this time.  If she wishes to be evaluated for that, this can be done on an outpatient basis.  Dr. Aleskerov recommends that patient be discharged home.  Patient then brought up that she has severe COPD and she would like to be evaluated for lung transplant.  I discussed with her that we do not have a transplant service in this hospital.  Transplant evaluation needs to be done on an outpatient basis and continuation to remain in the hospital for inpatient services would only delay the progress of her care should she require lung transplant.  Assessment and Plan:  * COPD with acute exacerbation (HCC) Continue home oxygen  supplementation At baseline, patient uses 2 L nasal cannula At home past three 2 puff inhalation twice daily, montelukast  10 mg nightly DuoNebs 3 times daily Albuterol  nebulizer every 4 hours as needed for wheezing and shortness of breath Tessalon capsules, 100 mg p.o. 3 times daily as needed for cough  09/14/2024: Patient states she does not have  BiPAP machine.  I discussed extensively with pulmonologist who states that the patient does not need BiPAP therapy at this time.  Pulmonologist states that they can evaluate patient's respiratory status on an outpatient basis to determine if patient needs BiPAP therapy.  09/15/24: Patient has  only required inhalers and has remained on 4 L nasal cannula as appropriate.  Her vitals and labs were reviewed and were unremarkable.  Extensive discussion with patient at bedside that she no longer meets inpatient criteria.  There will be as needed anxiety and a tapering steroid medication dose prescribed and sent to her pharmacy.  Again I discussed with her pulmonologist, she needs to go home and follow-up outpatient for pulmonary function testing for BiPAP evaluation and possible referral for pulmonary transplant specialist per her wish.  I discussed with daughter over the phone.    3 and 1 BSC, rollator walker, PT, OT, RN aide has been placed.  I discussed extensively with case worker, RN, pulmonologist, patient, daughter Brandi [name] over the phone  Insomnia Quetiapine 25 mg nightly resume  Generalized anxiety disorder with panic attacks Home buspirone  7.5 mg p.o. twice daily as needed for anxiety  Centrilobular emphysema (HCC) Patient can be seen by pulmonologist outpatient and a referral to pulmonary rehab can be done on an outpatient basis.    Patient cannot have home health aide, home health PT, OT and get an order for pulmonary rehab at the same time.  I discussed this with TOC specialist  Hyperlipidemia Ezetimibe  10 mg daily  Pain control - [place]  Controlled Substance Reporting System database was reviewed. and patient was instructed, not to drive, operate heavy machinery, perform activities at heights, swimming or participation in water activities or provide baby-sitting services while on Pain, Sleep and Anxiety Medications; until their outpatient Physician has advised to do so again. Also recommended to not to take more than prescribed Pain, Sleep and Anxiety Medications.  Consultants: Pulmonologist, PT, OT, TOC Procedures performed: None Disposition: Home Diet recommendation:  Cardiac diet DISCHARGE MEDICATION: Allergies as of 09/15/2024   No Known Allergies       Medication List     STOP taking these medications    Dupixent 300 MG/2ML Soaj Generic drug: Dupilumab   Ohtuvayre  3 MG/2.5ML Susp Generic drug: Ensifentrine    sulfamethoxazole -trimethoprim  400-80 MG tablet Commonly known as: BACTRIM        TAKE these medications    albuterol  108 (90 Base) MCG/ACT inhaler Commonly known as: VENTOLIN  HFA INHALE 2 PUFFS INTO THE LUNGS EVERY 4 HOURS AS NEEDED FOR WHEEZING OR SHORTNESS OF BREATH   albuterol  (2.5 MG/3ML) 0.083% nebulizer solution Commonly known as: PROVENTIL  USE 1 VIAL VIA NEBULIZER EVERY 6 HOURS AS NEEDED FOR WHEEZING OR SHORTNESS OF BREATH   alendronate  70 MG tablet Commonly known as: FOSAMAX  Take 1 tablet (70 mg total) by mouth once a week.   aspirin  EC 81 MG tablet Take 1 tablet (81 mg total) by mouth daily.   benzonatate 100 MG capsule Commonly known as: TESSALON Take 1 capsule (100 mg total) by mouth 3 (three) times daily as needed for cough.   Breztri  Aerosphere 160-9-4.8 MCG/ACT Aero inhaler Generic drug: budesonide-glycopyrrolate -formoterol Inhale 2 puffs into the lungs in the morning and at bedtime.   busPIRone  7.5 MG tablet Commonly known as: BUSPAR  Take 1 tablet (7.5 mg total) by mouth 2 (two) times daily as needed. for anxiety   cyanocobalamin  1000 MCG/ML injection Commonly known as: VITAMIN B12 Inject 1 mL (1,000 mcg total) into the  muscle once a week. For 4 weeks   docusate sodium 100 MG capsule Commonly known as: COLACE Take 2 capsules (200 mg total) by mouth 2 (two) times daily.   famotidine  20 MG tablet Commonly known as: PEPCID  TAKE 1 TABLET BY MOUTH TWICE DAILY AS NEEDED   feeding supplement Liqd Take 237 mLs by mouth 2 (two) times daily between meals.   HAIR NOURISHING SUPPLEMENT PO Take 1 tablet by mouth daily.   ipratropium-albuterol  0.5-2.5 (3) MG/3ML Soln Commonly known as: DUONEB Take 3 mLs by nebulization 3 (three) times daily.   LORazepam 0.5 MG tablet Commonly known as:  ATIVAN Take 1 tablet (0.5 mg total) by mouth every 8 (eight) hours as needed for anxiety.   montelukast  10 MG tablet Commonly known as: SINGULAIR  TAKE 1 TABLET BY MOUTH AT BEDTIME   multivitamin with minerals Tabs tablet Take 1 tablet by mouth daily.   omeprazole  20 MG capsule Commonly known as: PRILOSEC Take 1 capsule (20 mg total) by mouth 2 (two) times daily before a meal.   ondansetron  4 MG tablet Commonly known as: ZOFRAN  Take 1 tablet (4 mg total) by mouth every 6 (six) hours as needed for nausea or vomiting.   OXYGEN  Inhale 3 L into the lungs at bedtime as needed.   predniSONE  5 MG tablet Commonly known as: DELTASONE  Take 8 tablets (40 mg total) by mouth daily for 1 day starting on 10/11, THEN 7 tablets (35 mg total) daily for 1 day, THEN 6 tablets (30 mg total) daily for 1 day, THEN 5 tablets (25 mg total) daily for 1 day, THEN 4 tablets (20 mg total) daily for 1 day, THEN 3 tablets (15 mg total) daily for 1 day, THEN 2 tablets (10 mg total) daily for 1 day, THEN 1 tablet (5 mg total) daily for 1 day. Start taking on: September 16, 2024 What changed: See the new instructions.   QUEtiapine 25 MG tablet Commonly known as: SEROQUEL Take 1 tablet (25 mg total) by mouth at bedtime.   rosuvastatin  10 MG tablet Commonly known as: CRESTOR  Take 1 tablet (10 mg total) by mouth daily.   sodium chloride  0.65 % Soln nasal spray Commonly known as: OCEAN Place 1 spray into both nostrils as needed for congestion.   Vitamin D3 125 MCG (5000 UT) Tabs Take by mouth. Vitamin D3 5,000 IU daily for 12 weeks then reduce to OTC Vitamin D3 2,000 IU daily for maintenance               Durable Medical Equipment  (From admission, onward)           Start     Ordered   09/15/24 1101  For home use only DME 3 n 1  Once        09/15/24 1102   09/15/24 1100  For home use only DME 4 wheeled rolling walker with seat  Once       Question Answer Comment  Patient needs a walker to treat  with the following condition Weakness   Patient needs a walker to treat with the following condition Exercise intolerance      09/15/24 1102           Discharge Exam: Filed Weights   09/09/24 0817  Weight: 54.6 kg   Condition at discharge: good  The results of significant diagnostics from this hospitalization (including imaging, microbiology, ancillary and laboratory) are listed below for reference.   Imaging Studies: DG Chest [facility] 1 View Result  Date: 09/12/2024 EXAM: 1 VIEW(S) XRAY OF THE CHEST 09/12/2024 04:51:00 PM COMPARISON: 09/09/2024 CLINICAL HISTORY: Atelectasis FINDINGS: LUNGS AND PLEURA: Stable emphysema. No focal pulmonary opacity. No pulmonary edema. No pleural effusion. No pneumothorax. HEART AND MEDIASTINUM: Aortic atherosclerosis. Unchanged heart size and mediastinal contours. BONES AND SOFT TISSUES: No acute osseous abnormality. IMPRESSION: 1. Stable emphysema. No acute pulmonary process. 2. Aortic atherosclerosis. Electronically signed by: Andrea Gasman MD 09/12/2024 08:20 PM EDT RP Workstation: [id number]   ECHOCARDIOGRAM COMPLETE BUBBLE STUDY Result Date: 09/12/2024    ECHOCARDIOGRAM REPORT   Patient Name:   Brandi [name] Date of Exam: 09/12/2024 Medical Rec #:  [id number]     Height:       64.0 in Accession #:    [id number]    Weight:       120.4 lb Date of Birth:  07/06/1956    BSA:          1.577 m Patient Age:    67 years      BP:           124/78 mmHg Patient Gender: F             HR:           97 bpm. Exam Location:  [facility] Procedure: 2D Echo, Color Doppler, Cardiac Doppler and Saline Contrast Bubble            Study (Both Spectral and Color Flow Doppler were utilized during            procedure). Indications:     Diastolic CHF  History:         Patient has prior history of Echocardiogram examinations, most                  recent 08/16/2021. COPD; Risk Factors:Dyslipidemia. Anxiety.  Sonographer:     Christopher Furnace Referring Phys:  [id number] FUAD ALESKEROV Diagnosing Phys:  Lonni Hanson MD  Sonographer Comments: Technically challenging study due to limited acoustic windows, suboptimal apical window and suboptimal parasternal window. Image acquisition challenging due to COPD. IMPRESSIONS  1. Left ventricular ejection fraction, by estimation, is 65 to 70%. The left ventricle has normal function. Left ventricular endocardial border not optimally defined to evaluate regional wall motion. There is mild left ventricular hypertrophy. Indeterminate diastolic filling due to E-A fusion.  2. Right ventricular systolic function is normal. The right ventricular size is normal. Mildly increased right ventricular wall thickness. Tricuspid regurgitation signal is inadequate for assessing PA pressure.  3. The mitral valve is grossly normal. No evidence of mitral valve regurgitation. No evidence of mitral stenosis.  4. The aortic valve was not well visualized. Aortic valve regurgitation is not visualized. No aortic stenosis is present.  5. The inferior vena cava is normal in size with greater than 50% respiratory variability, suggesting right atrial pressure of 3 mmHg.  6. Bubble study does not show a large right-to-left shunt but cannot exclude a small intracardiac shunt due to suboptimal windows. FINDINGS  Left Ventricle: Left ventricular ejection fraction, by estimation, is 65 to 70%. The left ventricle has normal function. Left ventricular endocardial border not optimally defined to evaluate regional wall motion. The left ventricular internal cavity size was normal in size. There is mild left ventricular hypertrophy. Indeterminate diastolic filling due to E-A fusion. Right Ventricle: The right ventricular size is normal. Mildly increased right ventricular wall thickness. Right ventricular systolic function is normal. Tricuspid regurgitation signal is inadequate for assessing PA pressure. Left Atrium: Left atrial size was  normal in size. Right Atrium: Right atrial size was normal in size.  Pericardium: There is no evidence of pericardial effusion. Presence of epicardial fat layer. Mitral Valve: The mitral valve is grossly normal. No evidence of mitral valve regurgitation. No evidence of mitral valve stenosis. Tricuspid Valve: The tricuspid valve is grossly normal. Tricuspid valve regurgitation is trivial. Aortic Valve: The aortic valve was not well visualized. Aortic valve regurgitation is not visualized. No aortic stenosis is present. Aortic valve mean gradient measures 2.5 mmHg. Aortic valve peak gradient measures 4.0 mmHg. Aortic valve area, by VTI measures 3.01 cm. Pulmonic Valve: The pulmonic valve was not well visualized. Pulmonic valve regurgitation is not visualized. No evidence of pulmonic stenosis. Aorta: The aortic root is normal in size and structure. Pulmonary Artery: The pulmonary artery is not well seen. Venous: The inferior vena cava is normal in size with greater than 50% respiratory variability, suggesting right atrial pressure of 3 mmHg. IAS/Shunts: No atrial level shunt detected by color flow Doppler. Agitated saline contrast was given intravenously to evaluate for intracardiac shunting. Bubble study does not show a large right-to-left shunt but cannot exclude a small intracardiac shunt  due to suboptimal windows.  LEFT VENTRICLE PLAX 2D LVIDd:         3.00 cm   Diastology LVIDs:         2.10 cm   LV e' medial:    6.42 cm/s LV PW:         1.10 cm   LV E/e' medial:  9.1 LV IVS:        1.00 cm   LV e' lateral:   13.70 cm/s LVOT diam:     2.00 cm   LV E/e' lateral: 4.3 LV SV:         42 LV SV Index:   27 LVOT Area:     3.14 cm  RIGHT VENTRICLE RV Basal diam:  3.30 cm RV Mid diam:    2.60 cm LEFT ATRIUM           Index       RIGHT ATRIUM           Index LA diam:      2.20 cm 1.40 cm/m  RA Area:     12.70 cm LA Vol (A2C): 14.0 ml 8.88 ml/m  RA Volume:   31.10 ml  19.73 ml/m LA Vol (A4C): 4.5 ml  2.84 ml/m  AORTIC VALVE AV Area (Vmax):    2.68 cm AV Area (Vmean):   2.86 cm AV  Area (VTI):     3.01 cm AV Vmax:           100.10 cm/s AV Vmean:          69.750 cm/s AV VTI:            0.140 m AV Peak Grad:      4.0 mmHg AV Mean Grad:      2.5 mmHg LVOT Vmax:         85.40 cm/s LVOT Vmean:        63.500 cm/s LVOT VTI:          0.134 m LVOT/AV VTI ratio: 0.96  AORTA Ao Root diam: 2.60 cm MITRAL VALVE MV Area (PHT): 7.66 cm    SHUNTS MV Decel Time: 99 msec     Systemic VTI:  0.13 m MV E velocity: 58.70 cm/s  Systemic Diam: 2.00 cm MV A velocity: 90.10 cm/s MV E/A ratio:  0.65 Christopher End  MD Electronically signed by Lonni Hanson MD Signature Date/Time: 09/12/2024/5:58:57 PM    Final    DG Chest Port 1 View Result Date: 09/09/2024 EXAM: 1 VIEW(S) XRAY OF THE CHEST 09/09/2024 08:31:00 AM COMPARISON: 07/18/2024 CLINICAL HISTORY: sob, copd. Table formatting from the original note was not included.; patient coming in from home SOB for 2 weeks. Patient coming in on c-pap via EMS. ; hx of asthma, copd,. inital o2 67% on 4L [place] that she wears at home. patient was blue and mottled with ac3essory muscle use. 5 mg allbuterol, 2mg  mag, 125mg  solumed. Lung sounds were absent for EMS FINDINGS: LUNGS AND PLEURA: Hyperinflation with emphysema. No focal pulmonary opacity. No pulmonary edema. No pleural effusion. No pneumothorax. HEART AND MEDIASTINUM: Aortic atherosclerosis. No acute abnormality of the cardiac and mediastinal silhouettes. BONES AND SOFT TISSUES: No acute osseous abnormality. IMPRESSION: 1. Hyperinflation with emphysema, consistent with COPD. 2. Aortic atherosclerosis. Electronically signed by: Evalene Coho MD 09/09/2024 08:38 AM EDT RP Workstation: [id number]   Microbiology: Results for orders placed or performed during the hospital encounter of 09/09/24  Resp panel by RT-PCR (RSV, Flu A&B, Covid) Anterior Nasal Swab     Status: None   Collection Time: 09/09/24  8:25 AM   Specimen: Anterior Nasal Swab  Result Value Ref Range Status   SARS Coronavirus 2 by RT PCR NEGATIVE  NEGATIVE Final    Comment: (NOTE) SARS-CoV-2 target nucleic acids are NOT DETECTED.  The SARS-CoV-2 RNA is generally detectable in upper respiratory specimens during the acute phase of infection. The lowest concentration of SARS-CoV-2 viral copies this assay can detect is 138 copies/mL. A negative result does not preclude SARS-Cov-2 infection and should not be used as the sole basis for treatment or other patient management decisions. A negative result may occur with  improper specimen collection/handling, submission of specimen other than nasopharyngeal swab, presence of viral mutation(s) within the areas targeted by this assay, and inadequate number of viral copies(<138 copies/mL). A negative result must be combined with clinical observations, patient history, and epidemiological information. The expected result is Negative.  Fact Sheet for Patients:  BloggerCourse.com  Fact Sheet for Healthcare Providers:  SeriousBroker.it  This test is no t yet approved or cleared by the [place]  FDA and  has been authorized for detection and/or diagnosis of SARS-CoV-2 by FDA under an Emergency Use Authorization (EUA). This EUA will remain  in effect (meaning this test can be used) for the duration of the COVID-19 declaration under Section 564(b)(1) of the Act, 21 U.S.C.section 360bbb-3(b)(1), unless the authorization is terminated  or revoked sooner.       Influenza A by PCR NEGATIVE NEGATIVE Final   Influenza B by PCR NEGATIVE NEGATIVE Final    Comment: (NOTE) The Xpert Xpress SARS-CoV-2/FLU/RSV plus assay is intended as an aid in the diagnosis of influenza from Nasopharyngeal swab specimens and should not be used as a sole basis for treatment. Nasal washings and aspirates are unacceptable for Xpert Xpress SARS-CoV-2/FLU/RSV testing.  Fact Sheet for Patients: BloggerCourse.com  Fact Sheet for Healthcare  Providers: SeriousBroker.it  This test is not yet approved or cleared by the [place]  FDA and has been authorized for detection and/or diagnosis of SARS-CoV-2 by FDA under an Emergency Use Authorization (EUA). This EUA will remain in effect (meaning this test can be used) for the duration of the COVID-19 declaration under Section 564(b)(1) of the Act, 21 U.S.C. section 360bbb-3(b)(1), unless the authorization is terminated or revoked.     Resp Syncytial  Virus by PCR NEGATIVE NEGATIVE Final    Comment: (NOTE) Fact Sheet for Patients: BloggerCourse.com  Fact Sheet for Healthcare Providers: SeriousBroker.it  This test is not yet approved or cleared by the [place]  FDA and has been authorized for detection and/or diagnosis of SARS-CoV-2 by FDA under an Emergency Use Authorization (EUA). This EUA will remain in effect (meaning this test can be used) for the duration of the COVID-19 declaration under Section 564(b)(1) of the Act, 21 U.S.C. section 360bbb-3(b)(1), unless the authorization is terminated or revoked.  Performed at [facility], [address] [place], [place] [address]   Respiratory (~20 pathogens) panel by PCR     Status: None   Collection Time: 09/09/24 10:39 AM   Specimen: Nasopharyngeal Swab; Respiratory  Result Value Ref Range Status   Adenovirus NOT DETECTED NOT DETECTED Final   Coronavirus 229E NOT DETECTED NOT DETECTED Final    Comment: (NOTE) The Coronavirus on the Respiratory Panel, DOES NOT test for the novel  Coronavirus (2019 nCoV)    Coronavirus HKU1 NOT DETECTED NOT DETECTED Final   Coronavirus NL63 NOT DETECTED NOT DETECTED Final   Coronavirus OC43 NOT DETECTED NOT DETECTED Final   Metapneumovirus NOT DETECTED NOT DETECTED Final   Rhinovirus / Enterovirus NOT DETECTED NOT DETECTED Final   Influenza A NOT DETECTED NOT DETECTED Final   Influenza B NOT  DETECTED NOT DETECTED Final   Parainfluenza Virus 1 NOT DETECTED NOT DETECTED Final   Parainfluenza Virus 2 NOT DETECTED NOT DETECTED Final   Parainfluenza Virus 3 NOT DETECTED NOT DETECTED Final   Parainfluenza Virus 4 NOT DETECTED NOT DETECTED Final   Respiratory Syncytial Virus NOT DETECTED NOT DETECTED Final   Bordetella pertussis NOT DETECTED NOT DETECTED Final   Bordetella Parapertussis NOT DETECTED NOT DETECTED Final   Chlamydophila pneumoniae NOT DETECTED NOT DETECTED Final   Mycoplasma pneumoniae NOT DETECTED NOT DETECTED Final    Comment: Performed at [facility], [address] [place], [place] [address]  Expectorated Sputum Assessment w Gram Stain, Rflx to Resp Cult     Status: None   Collection Time: 09/09/24 11:05 PM   Specimen: Expectorated Sputum  Result Value Ref Range Status   Specimen Description EXPECTORATED SPUTUM  Final   Special Requests NONE  Final   Sputum evaluation   Final    Sputum specimen not acceptable for testing.  Please recollect.   Performed at [facility], [address] [place], [place] [address]    Report Status 09/10/2024 FINAL  Final   Labs reviewed:  CBC: Recent Labs  Lab 09/11/24 0526 09/12/24 0421 09/13/24 0517 09/14/24 0435 09/15/24 0547  WBC 11.9* 9.4 10.3 10.1 9.6  HGB 12.1 12.2 11.8* 11.9* 12.0  HCT 37.0 37.0 36.1 37.6 37.8  MCV 97.6 96.9 98.9 100.0 100.3*  PLT 291 295 295 285 295   Basic Metabolic Panel: Recent Labs  Lab 09/11/24 0526 09/12/24 0421 09/13/24 0517 09/14/24 0435 09/15/24 0547  NA 140 137 138 136 141  K 4.8 4.5 4.0 4.0 4.6  CL 101 100 99 98 100  CO2 30 29 30 30  32  GLUCOSE 127* 127* 90 89 89  BUN 29* 25* 26* 28* 23  CREATININE 0.55 0.57 0.63 0.52 0.54  CALCIUM  9.0 8.7* 8.5* 8.7* 9.1   Liver Function Tests: Recent Labs  Lab 09/09/24 0825  AST 23  ALT 19  ALKPHOS 36*  BILITOT 0.6  PROT 6.7  ALBUMIN 3.7   CBG: No results for  input(s): GLUCAP in the last 168  hours.  Discharge time spent: greater than 30 minutes.  Signed: Dr. Sherre [facility] 09/15/2024

## 2024-09-18 ENCOUNTER — Ambulatory Visit: Admitting: Pulmonary Disease

## 2024-09-18 ENCOUNTER — Telehealth: Payer: Self-pay

## 2024-09-18 NOTE — Transitions of Care (Post Inpatient/ED Visit) (Signed)
 09/18/2024  Name: Brandi [name] MRN: [id number] DOB: [birth year]  Today's TOC FU Call Status: Today's TOC FU Call Status:: Successful TOC FU Call Completed TOC FU Call Complete Date: 09/18/24 Patient's Name and Date of Birth confirmed.  Transition Care Management Follow-up Telephone Call Date of Discharge: 09/16/24 Discharge Facility: [facility]) Type of Discharge: Inpatient Admission Primary Inpatient Discharge Diagnosis:: COPD How have you been since you were released from the hospital?: Better Any questions or concerns?: No  Items Reviewed: Did you receive and understand the discharge instructions provided?: Yes Medications obtained,verified, and reconciled?: Yes (Medications Reviewed) Any new allergies since your discharge?: No Dietary orders reviewed?: Yes Type of Diet Ordered:: Low Sodium Heart Healthy Do you have support at home?: Yes People in Home [RPT]: alone Name of Support/Comfort Primary Source: Brandi [name]  Medications Reviewed Today: Medications Reviewed Today     Reviewed by Brandi Reusing, RN (Case Manager) on 09/18/24 at 1253  Med List Status: <None>   Medication Order Taking? Sig Documenting Provider Last Dose Status Informant  albuterol  (PROVENTIL ) (2.5 MG/3ML) 0.083% nebulizer solution [id number]  USE 1 VIAL VIA NEBULIZER EVERY 6 HOURS AS NEEDED FOR WHEEZING OR SHORTNESS OF Brandi Tamea Dedra LITTIE, MD  Active Self  albuterol  (VENTOLIN  HFA) 108 (90 Base) MCG/ACT inhaler [id number]  INHALE 2 PUFFS INTO THE LUNGS EVERY 4 HOURS AS NEEDED FOR WHEEZING OR SHORTNESS OF BREATH [name], Brandi [name] PARAS, DO  Active Self  alendronate  (FOSAMAX ) 70 MG tablet [id number]  Take 1 tablet (70 mg total) by mouth once a week. Edman Brandi [name] PARAS, DO  Active Self  aspirin  EC 81 MG tablet [id number]  Take 1 tablet (81 mg total) by mouth daily. Gollan, [name], MD  Active Self  benzonatate (TESSALON) 100 MG capsule [id number]  Take 1 capsule  (100 mg total) by mouth 3 (three) times daily as needed for cough. Cox, Amy N, DO  Active   budesonide-glycopyrrolate -formoterol (BREZTRI  AEROSPHERE) 160-9-4.8 MCG/ACT AERO inhaler [id number]  Inhale 2 puffs into the lungs in the morning and at bedtime. Tamea Dedra LITTIE, MD  Active Self  busPIRone  (BUSPAR ) 7.5 MG tablet [id number]  Take 1 tablet (7.5 mg total) by mouth 2 (two) times daily as needed. for anxiety Edman Brandi [name] PARAS, DO  Active Self  Cholecalciferol (VITAMIN D3) 125 MCG (5000 UT) TABS [id number]  Take by mouth. Vitamin D3 5,000 IU daily for 12 weeks then reduce to OTC Vitamin D3 2,000 IU daily for maintenance [provider]  Active Self  cyanocobalamin  (VITAMIN B12) 1000 MCG/ML injection [id number]  Inject 1 mL (1,000 mcg total) into the muscle once a week. For 4 weeks Edman Brandi [name] PARAS, DO  Active Self  docusate sodium (COLACE) 100 MG capsule [id number]  Take 2 capsules (200 mg total) by mouth 2 (two) times daily. Cox, Amy N, DO  Active   famotidine  (PEPCID ) 20 MG tablet [id number]  TAKE 1 TABLET BY MOUTH TWICE DAILY AS NEEDED Edman, Brandi [name] PARAS, DO  Active Self  feeding supplement (ENSURE ENLIVE / ENSURE PLUS) LIQD [id number]  Take 237 mLs by mouth 2 (two) times daily between meals.  Patient not taking: Reported on 09/18/2024   [name], DO  Active Self  ipratropium-albuterol  (DUONEB) 0.5-2.5 (3) MG/3ML SOLN [id number]  Take 3 mLs by nebulization 3 (three) times daily. Cox, Amy N, DO  Active   LORazepam (ATIVAN) 0.5 MG tablet [id number]  Take 1 tablet (0.5 mg total) by mouth every 8 (eight) hours as needed for anxiety.  Cox, Amy N, DO  Active   montelukast  (SINGULAIR ) 10 MG tablet [id number]  TAKE 1 TABLET BY MOUTH AT BEDTIME Edman Brandi [name] PARAS, DO  Active Self  Multiple Vitamin (MULTIVITAMIN WITH MINERALS) TABS tablet [id number]  Take 1 tablet by mouth daily. Fausto Burnard LABOR, DO  Active Self           Med Note [name]   Dju Sep 09, 2024  10:43 AM)    omeprazole  (PRILOSEC) 20 MG capsule [id number]  Take 1 capsule (20 mg total) by mouth 2 (two) times daily before a meal. Edman, Brandi [name] PARAS, DO  Active Self  ondansetron  (ZOFRAN ) 4 MG tablet [id number]  Take 1 tablet (4 mg total) by mouth every 6 (six) hours as needed for nausea or vomiting. Cox, Amy N, DO  Active   OXYGEN  [id number]  Inhale 3 L into the lungs at bedtime as needed. [provider]  Active Self           Med Note [name], [name] Nov 17, 2023  2:51 PM) Reports currently using 4 min/l  predniSONE  (DELTASONE ) 5 MG tablet [id number]  Take 8 tablets (40 mg total) by mouth daily for 1 day starting on 10/11, THEN 7 tablets (35 mg total) daily for 1 day, THEN 6 tablets (30 mg total) daily for 1 day, THEN 5 tablets (25 mg total) daily for 1 day, THEN 4 tablets (20 mg total) daily for 1 day, THEN 3 tablets (15 mg total) daily for 1 day, THEN 2 tablets (10 mg total) daily for 1 day, THEN 1 tablet (5 mg total) daily for 1 day. Cox, Amy N, DO  Active   QUEtiapine (SEROQUEL) 25 MG tablet [id number]  Take 1 tablet (25 mg total) by mouth at bedtime. Cox, Amy N, DO  Active   rosuvastatin  (CRESTOR ) 10 MG tablet [id number]  Take 1 tablet (10 mg total) by mouth daily. Gollan, [name], MD  Active Self           Med Note [name]   Dju Sep 09, 2024 10:44 AM)    sodium chloride  (OCEAN) 0.65 % SOLN nasal spray [id number]  Place 1 spray into both nostrils as needed for congestion. [provider]  Active Self  Specialty Vitamins Products (HAIR NOURISHING SUPPLEMENT PO) [id number]  Take 1 tablet by mouth daily. [provider]  Active Self            Home Care and Equipment/Supplies: Were Home Health Services Ordered?: Yes Name of Home Health Agency:: [facility] Has Agency set up a time to come to your home?: No EMR reviewed for Home Health Orders: Orders present/patient has not received call (refer to CM for follow-up) Any new equipment or  medical supplies ordered?: Yes Name of Medical supply agency?: [facility] Were you able to get the equipment/medical supplies?: Yes Do you have any questions related to the use of the equipment/supplies?: No  Functional Questionnaire: Do you need assistance with bathing/showering or dressing?: No Do you need assistance with meal preparation?: No Do you need assistance with eating?: No Do you have difficulty maintaining continence: No Do you need assistance with getting out of bed/getting out of a chair/moving?: No Do you have difficulty managing or taking your medications?: No  Follow up appointments reviewed: PCP Follow-up appointment confirmed?: Yes Date of PCP follow-up appointment?: 09/25/24 Follow-up Provider: Dr. Edman Specialist [facility] Follow-up appointment confirmed?: No Reason Specialist Follow-Up Not Confirmed: Patient has Specialist Provider Number  and will Call for Appointment Do you need transportation to your follow-up appointment?: No Do you understand care options if your condition(s) worsen?: Yes-patient verbalized understanding  SDOH Interventions Today    Flowsheet Row Most Recent Value  SDOH Interventions   Food Insecurity Interventions Intervention Not Indicated  Housing Interventions Intervention Not Indicated  Transportation Interventions Intervention Not Indicated  Utilities Interventions Intervention Not Indicated    Goals Addressed             This Visit's Progress    VBCI Transitions of Care (TOC) Care Plan       Problems:  Recent Hospitalization for treatment of COPD Hospital  or ED Adm 89%  Goal:  Over the next 30 days, the patient will not experience hospital readmission  Interventions:   COPD Interventions: Advised patient to track and manage COPD triggers Assessed social determinant of health barriers Discussed the importance of adequate rest and management of fatigue with COPD Provided instruction about proper use of medications  used for management of COPD including inhalers Screening for signs and symptoms of depression related to chronic disease state  Use of home oxygen  3-4l/min PRN at bedtime  Patient Self Care Activities:  Attend all scheduled provider appointments Call pharmacy for medication refills 3-7 days in advance of running out of medications Call provider office for new concerns or questions  Notify RN Care Manager of [facility] call rescheduling needs Participate in Transition of Care Program/Attend [facility] scheduled calls Perform all self care activities independently  Take medications as prescribed    Plan:  Telephone follow up appointment with care management team member scheduled for:  Tuesday October 21st at 11:00am        Medford Balboa, BSN, RN [place]  VBCI - [facility] RN Care Manager [phone]

## 2024-09-18 NOTE — Patient Instructions (Signed)
 Visit Information  Thank you for taking time to visit with me today. Please don't hesitate to contact me if I can be of assistance to you before our next scheduled telephone appointment.  Our next appointment is by telephone on Tuesday October 21st at 11:00am  Following is a copy of your care plan:   Goals Addressed             This Visit's Progress    VBCI Transitions of Care (TOC) Care Plan       Problems:  Recent Hospitalization for treatment of COPD Hospital  or ED Adm 89%  Goal:  Over the next 30 days, the patient will not experience hospital readmission  Interventions:   COPD Interventions: Advised patient to track and manage COPD triggers Assessed social determinant of health barriers Discussed the importance of adequate rest and management of fatigue with COPD Provided instruction about proper use of medications used for management of COPD including inhalers Screening for signs and symptoms of depression related to chronic disease state  Use of home oxygen  3-4l/min PRN at bedtime  Patient Self Care Activities:  Attend all scheduled provider appointments Call pharmacy for medication refills 3-7 days in advance of running out of medications Call provider office for new concerns or questions  Notify RN Care Manager of [facility] call rescheduling needs Participate in Transition of Care Program/Attend [facility] scheduled calls Perform all self care activities independently  Take medications as prescribed    Plan:  Telephone follow up appointment with care management team member scheduled for:  Tuesday October 21st at 11:00am        Patient verbalizes understanding of instructions and care plan provided today and agrees to view in MyChart. Active MyChart status and patient understanding of how to access instructions and care plan via MyChart confirmed with patient.     The patient has been provided with contact information for the care management team and has been advised to  call with any health related questions or concerns.   Please call the care guide team at [phone] if you need to cancel or reschedule your appointment.   Please call the Suicide and Crisis Lifeline: 988 call the [place]  National Suicide Prevention Lifeline: 5023374687 or TTY: 9385442846 TTY [phone]) to talk to a trained counselor if you are experiencing a Mental Health or Behavioral Health Crisis or need someone to talk to.  Medford Balboa, BSN, RN [place]  VBCI - [employer] [facility] RN Care Manager [phone]

## 2024-09-20 ENCOUNTER — Ambulatory Visit (INDEPENDENT_AMBULATORY_CARE_PROVIDER_SITE_OTHER)

## 2024-09-20 DIAGNOSIS — E538 Deficiency of other specified B group vitamins: Secondary | ICD-10-CM

## 2024-09-20 MED ORDER — CYANOCOBALAMIN 1000 MCG/ML IJ SOLN
1000.0000 ug | Freq: Once | INTRAMUSCULAR | Status: AC
  Administered 2024-09-20: 1000 ug via INTRAMUSCULAR

## 2024-09-23 DIAGNOSIS — J449 Chronic obstructive pulmonary disease, unspecified: Secondary | ICD-10-CM | POA: Diagnosis not present

## 2024-09-25 ENCOUNTER — Encounter: Payer: Self-pay | Admitting: Family Medicine

## 2024-09-25 ENCOUNTER — Ambulatory Visit (INDEPENDENT_AMBULATORY_CARE_PROVIDER_SITE_OTHER): Admitting: Family Medicine

## 2024-09-25 VITALS — BP 110/70 | HR 111 | Ht 64.0 in | Wt 123.2 lb

## 2024-09-25 DIAGNOSIS — M6281 Muscle weakness (generalized): Secondary | ICD-10-CM

## 2024-09-25 DIAGNOSIS — F5104 Psychophysiologic insomnia: Secondary | ICD-10-CM

## 2024-09-25 DIAGNOSIS — E538 Deficiency of other specified B group vitamins: Secondary | ICD-10-CM | POA: Diagnosis not present

## 2024-09-25 DIAGNOSIS — J9611 Chronic respiratory failure with hypoxia: Secondary | ICD-10-CM

## 2024-09-25 DIAGNOSIS — K5900 Constipation, unspecified: Secondary | ICD-10-CM

## 2024-09-25 DIAGNOSIS — J432 Centrilobular emphysema: Secondary | ICD-10-CM

## 2024-09-25 DIAGNOSIS — R0602 Shortness of breath: Secondary | ICD-10-CM

## 2024-09-25 DIAGNOSIS — R600 Localized edema: Secondary | ICD-10-CM

## 2024-09-25 DIAGNOSIS — F411 Generalized anxiety disorder: Secondary | ICD-10-CM

## 2024-09-25 MED ORDER — DOCUSATE SODIUM 100 MG PO CAPS: 100.0000 mg | ORAL_CAPSULE | Freq: Every day | ORAL | 1 refills | Status: AC | PRN

## 2024-09-25 MED ORDER — LORAZEPAM 0.5 MG PO TABS: 0.5000 mg | ORAL_TABLET | Freq: Three times a day (TID) | ORAL | 2 refills | Status: AC | PRN

## 2024-09-25 MED ORDER — CYANOCOBALAMIN 1000 MCG/ML IJ SOLN
1000.0000 ug | Freq: Once | INTRAMUSCULAR | Status: AC
  Administered 2024-09-25: 1000 ug via INTRAMUSCULAR

## 2024-09-25 MED ORDER — QUETIAPINE FUMARATE 25 MG PO TABS
25.0000 mg | ORAL_TABLET | Freq: Every day | ORAL | 1 refills | Status: AC
Start: 2024-09-25 — End: ?

## 2024-09-25 MED ORDER — FUROSEMIDE 20 MG PO TABS: 20.0000 mg | ORAL_TABLET | Freq: Every day | ORAL | 1 refills | Status: AC

## 2024-09-25 MED ORDER — CYANOCOBALAMIN 1000 MCG/ML IJ SOLN: 1000.0000 ug | INTRAMUSCULAR | 0 refills | Status: AC

## 2024-09-25 NOTE — Progress Notes (Signed)
 Subjective:    Patient ID: Brandi [name], female    DOB: 30-Dec-1955, 68 y.o.   MRN: [id number]  Brandi [name] is a 68 y.o. female presenting on 09/25/2024 for Hospitalization Follow-up and COPD   HPI  Discussed the use of AI scribe software for clinical note transcription with the patient, who gave verbal consent to proceed.  History of Present Illness   Brandi [name] is a 68 year old female with COPD who presents for a hospital follow-up after a recent severe flare-up.  HOSPITAL FOLLOW-UP VISIT  Hospital/Location: [facility] Date of Admission: 09/09/24 Date of Discharge: 09/15/24 Transitions of care telephone call: 09/18/24 Wanda Balboa RN completed  Reason for Admission: COPD Exacerbation  - Hospital H&P and Discharge Summary have been reviewed - Patient presents today 10 days after recent hospitalization.  Dyspnea and hypoxemia - Hospitalized from October 4th to October 10th for a severe COPD exacerbation with oxygen  saturation dropping to 63%, requiring emergency intervention. Follow-up with [facility] Pulmonology, Dr Parris for consideration of BiPAP - Received oxygen  therapy, CPAP, antibiotics, and steroids during hospitalization. - Oxygen  saturation improved with therapy in hospital - Currently on continuous supplemental oxygen  at 4 L/min, though there is some confusion regarding the exact flow rate. - Concerned about the impact of upcoming cold weather on respiratory status. - She has Pulm apt follow-up in 3 days on 10/23 They recommended Pulmonary Rehab and PT  Lower extremity edema - Developed bilateral leg swelling since hospital discharge, attributed to fluid retention. - Requesting order of Furosemide PRN  Constipation - Experiencing constipation, managed effectively with a stool softener.  Anxiety and sleep disturbance - Ongoing anxiety, worsened by recent hospitalization and conflicting information about pulmonary status. - Uses buspirone  as needed for  anxiety and lorazepam for severe anxiety episodes from hospital, both are helpful. She understands to take Buspar  and can use Lorazepam if needed - Takes quetiapine for sleep.  Nutritional supplementation - Receiving vitamin B12 injections, transitioning to a monthly schedule. - Consuming tangerines for vitamin C supplementation.    I have reviewed the discharge medication list, and have reconciled the current and discharge medications today.   Current Outpatient Medications:    albuterol  (PROVENTIL ) (2.5 MG/3ML) 0.083% nebulizer solution, USE 1 VIAL VIA NEBULIZER EVERY 6 HOURS AS NEEDED FOR WHEEZING OR SHORTNESS OF BREATH, Disp: 75 mL, Rfl: 6   alendronate  (FOSAMAX ) 70 MG tablet, Take 1 tablet (70 mg total) by mouth once a week., Disp: 12 tablet, Rfl: 3   aspirin  EC 81 MG tablet, Take 1 tablet (81 mg total) by mouth daily., Disp: 90 tablet, Rfl: 3   benzonatate (TESSALON) 100 MG capsule, Take 1 capsule (100 mg total) by mouth 3 (three) times daily as needed for cough., Disp: 20 capsule, Rfl: 0   budesonide-glycopyrrolate -formoterol (BREZTRI  AEROSPHERE) 160-9-4.8 MCG/ACT AERO inhaler, Inhale 2 puffs into the lungs in the morning and at bedtime., Disp: 3 each, Rfl: 3   busPIRone  (BUSPAR ) 7.5 MG tablet, Take 1 tablet (7.5 mg total) by mouth 2 (two) times daily as needed. for anxiety, Disp: 180 tablet, Rfl: 1   Cholecalciferol (VITAMIN D3) 125 MCG (5000 UT) TABS, Take by mouth. Vitamin D3 5,000 IU daily for 12 weeks then reduce to OTC Vitamin D3 2,000 IU daily for maintenance, Disp: , Rfl:    famotidine  (PEPCID ) 20 MG tablet, TAKE 1 TABLET BY MOUTH TWICE DAILY AS NEEDED, Disp: 180 tablet, Rfl: 1   furosemide (LASIX) 20 MG tablet, Take 1 tablet (20 mg total) by mouth  daily., Disp: 30 tablet, Rfl: 1   ipratropium-albuterol  (DUONEB) 0.5-2.5 (3) MG/3ML SOLN, Take 3 mLs by nebulization 3 (three) times daily., Disp: 360 mL, Rfl: 0   montelukast  (SINGULAIR ) 10 MG tablet, TAKE 1 TABLET BY MOUTH AT  BEDTIME, Disp: 90 tablet, Rfl: 0   Multiple Vitamin (MULTIVITAMIN WITH MINERALS) TABS tablet, Take 1 tablet by mouth daily., Disp: , Rfl:    omeprazole  (PRILOSEC) 20 MG capsule, Take 1 capsule (20 mg total) by mouth 2 (two) times daily before a meal., Disp: 180 capsule, Rfl: 3   ondansetron  (ZOFRAN ) 4 MG tablet, Take 1 tablet (4 mg total) by mouth every 6 (six) hours as needed for nausea or vomiting., Disp: 10 tablet, Rfl: 0   OXYGEN , Inhale 3 L into the lungs at bedtime as needed., Disp: , Rfl:    rosuvastatin  (CRESTOR ) 10 MG tablet, Take 1 tablet (10 mg total) by mouth daily., Disp: 90 tablet, Rfl: 3   sodium chloride  (OCEAN) 0.65 % SOLN nasal spray, Place 1 spray into both nostrils as needed for congestion., Disp: , Rfl:    Specialty Vitamins Products (HAIR NOURISHING SUPPLEMENT PO), Take 1 tablet by mouth daily., Disp: , Rfl:    albuterol  (VENTOLIN  HFA) 108 (90 Base) MCG/ACT inhaler, INHALE 2 PUFFS INTO THE LUNGS EVERY 4 HOURS AS NEEDED FOR WHEEZING OR SHORTNESS OF BREATH (Patient not taking: Reported on 09/25/2024), Disp: 8.5 g, Rfl: 2   cyanocobalamin  (VITAMIN B12) 1000 MCG/ML injection, Inject 1 mL (1,000 mcg total) into the muscle every 30 (thirty) days. For 4 months, Disp: 4 mL, Rfl: 0   docusate sodium (COLACE) 100 MG capsule, Take 1 capsule (100 mg total) by mouth daily as needed for mild constipation or moderate constipation., Disp: 90 capsule, Rfl: 1   feeding supplement (ENSURE ENLIVE / ENSURE PLUS) LIQD, Take 237 mLs by mouth 2 (two) times daily between meals. (Patient not taking: Reported on 09/25/2024), Disp: 237 mL, Rfl: 12   LORazepam (ATIVAN) 0.5 MG tablet, Take 1 tablet (0.5 mg total) by mouth every 8 (eight) hours as needed for anxiety., Disp: 20 tablet, Rfl: 2   QUEtiapine (SEROQUEL) 25 MG tablet, Take 1 tablet (25 mg total) by mouth at bedtime., Disp: 90 tablet, Rfl: 1  ------------------------------------------------------------------------- Social History   Tobacco Use    Smoking status: Former    Current packs/day: 0.00    Average packs/day: 0.8 packs/day for 41.0 years (30.8 ttl pk-yrs)    Types: Cigarettes    Start date: 12/07/1974    Quit date: 12/08/2015    Years since quitting: 8.8   Smokeless tobacco: Never  Vaping Use   Vaping status: Never Used  Substance Use Topics   Alcohol use: No   Drug use: No    Review of Systems Per HPI unless specifically indicated above     Objective:    BP 110/70 (BP Location: Left Arm, Patient Position: Sitting, Cuff Size: Normal)   Pulse (!) 111   Ht 5' 4 (1.626 m)   Wt 123 lb 4 oz (55.9 kg)   SpO2 95%   BMI 21.16 kg/m   Wt Readings from Last 3 Encounters:  09/25/24 123 lb 4 oz (55.9 kg)  09/18/24 120 lb (54.4 kg)  09/09/24 120 lb 5.9 oz (54.6 kg)    Physical Exam Vitals and nursing note reviewed.  Constitutional:      General: She is not in acute distress.    Appearance: She is well-developed. She is not diaphoretic.  Comments: Well-appearing, comfortable, cooperative  HENT:     Head: Normocephalic and atraumatic.  Eyes:     General:        Right eye: No discharge.        Left eye: No discharge.     Conjunctiva/sclera: Conjunctivae normal.  Neck:     Thyroid : No thyromegaly.  Cardiovascular:     Rate and Rhythm: Normal rate and regular rhythm.     Heart sounds: Normal heart sounds. No murmur heard. Pulmonary:     Effort: Pulmonary effort is normal. No respiratory distress.     Breath sounds: No wheezing or rales.     Comments: Reduced air movement at baseline, no coarse sounds or wheezing  Portable oxygen  4 L Musculoskeletal:        General: Normal range of motion.     Cervical back: Normal range of motion and neck supple.  Lymphadenopathy:     Cervical: No cervical adenopathy.  Skin:    General: Skin is warm and dry.     Findings: No erythema or rash.  Neurological:     Mental Status: She is alert and oriented to person, place, and time.  Psychiatric:        Behavior: Behavior  normal.     Comments: Well groomed, good eye contact, normal speech and thoughts       Results for orders placed or performed during the hospital encounter of 09/09/24  Resp panel by RT-PCR (RSV, Flu A&B, Covid) Anterior Nasal Swab   Collection Time: 09/09/24  8:25 AM   Specimen: Anterior Nasal Swab  Result Value Ref Range   SARS Coronavirus 2 by RT PCR NEGATIVE NEGATIVE   Influenza A by PCR NEGATIVE NEGATIVE   Influenza B by PCR NEGATIVE NEGATIVE   Resp Syncytial Virus by PCR NEGATIVE NEGATIVE  CBC   Collection Time: 09/09/24  8:25 AM  Result Value Ref Range   WBC 10.3 4.0 - 10.5 K/uL   RBC 4.22 3.87 - 5.11 MIL/uL   Hemoglobin 13.3 12.0 - 15.0 g/dL   HCT 58.4 63.9 - 53.9 %   MCV 98.3 80.0 - 100.0 fL   MCH 31.5 26.0 - 34.0 pg   MCHC 32.0 30.0 - 36.0 g/dL   RDW 88.0 88.4 - 84.4 %   Platelets 284 150 - 400 K/uL   nRBC 0.0 0.0 - 0.2 %  Comprehensive metabolic panel   Collection Time: 09/09/24  8:25 AM  Result Value Ref Range   Sodium 135 135 - 145 mmol/L   Potassium 3.9 3.5 - 5.1 mmol/L   Chloride 94 (L) 98 - 111 mmol/L   CO2 27 22 - 32 mmol/L   Glucose, Bld 158 (H) 70 - 99 mg/dL   BUN 15 8 - 23 mg/dL   Creatinine, Ser 9.40 0.44 - 1.00 mg/dL   Calcium  8.7 (L) 8.9 - 10.3 mg/dL   Total Protein 6.7 6.5 - 8.1 g/dL   Albumin 3.7 3.5 - 5.0 g/dL   AST 23 15 - 41 U/L   ALT 19 0 - 44 U/L   Alkaline Phosphatase 36 (L) 38 - 126 U/L   Total Bilirubin 0.6 0.0 - 1.2 mg/dL   GFR, Estimated >39 >39 mL/min   Anion gap 14 5 - 15  Brain natriuretic peptide   Collection Time: 09/09/24  8:25 AM  Result Value Ref Range   B Natriuretic Peptide 10.3 0.0 - 100.0 pg/mL  Troponin I (High Sensitivity)   Collection Time: 09/09/24  8:25  AM  Result Value Ref Range   Troponin I (High Sensitivity) 5 <18 ng/L  Blood gas, venous   Collection Time: 09/09/24  9:00 AM  Result Value Ref Range   pH, Ven 7.26 7.25 - 7.43   pCO2, Ven 70 (H) 44 - 60 mmHg   pO2, Ven 34 32 - 45 mmHg   Bicarbonate 31.4  (H) 20.0 - 28.0 mmol/L   Acid-Base Excess 2.3 (H) 0.0 - 2.0 mmol/L   O2 Saturation 54.7 %   Patient temperature 37.0    Collection site VENOUS   Respiratory (~20 pathogens) panel by PCR   Collection Time: 09/09/24 10:39 AM   Specimen: Nasopharyngeal Swab; Respiratory  Result Value Ref Range   Adenovirus NOT DETECTED NOT DETECTED   Coronavirus 229E NOT DETECTED NOT DETECTED   Coronavirus HKU1 NOT DETECTED NOT DETECTED   Coronavirus NL63 NOT DETECTED NOT DETECTED   Coronavirus OC43 NOT DETECTED NOT DETECTED   Metapneumovirus NOT DETECTED NOT DETECTED   Rhinovirus / Enterovirus NOT DETECTED NOT DETECTED   Influenza A NOT DETECTED NOT DETECTED   Influenza B NOT DETECTED NOT DETECTED   Parainfluenza Virus 1 NOT DETECTED NOT DETECTED   Parainfluenza Virus 2 NOT DETECTED NOT DETECTED   Parainfluenza Virus 3 NOT DETECTED NOT DETECTED   Parainfluenza Virus 4 NOT DETECTED NOT DETECTED   Respiratory Syncytial Virus NOT DETECTED NOT DETECTED   Bordetella pertussis NOT DETECTED NOT DETECTED   Bordetella Parapertussis NOT DETECTED NOT DETECTED   Chlamydophila pneumoniae NOT DETECTED NOT DETECTED   Mycoplasma pneumoniae NOT DETECTED NOT DETECTED  HIV Antibody (routine testing w rflx)   Collection Time: 09/09/24 10:39 AM  Result Value Ref Range   HIV Screen 4th Generation wRfx Non Reactive Non Reactive  Mycoplasma pneumoniae antibody, IgM   Collection Time: 09/09/24 10:39 AM  Result Value Ref Range   Mycoplasma pneumo IgM <770 0 - 769 U/mL  Legionella Pneumophila Serogp 1 Ur Ag   Collection Time: 09/09/24 11:03 PM  Result Value Ref Range   L. pneumophila Serogp 1 Ur Ag Negative Negative   Source of Sample URINE, RANDOM   Expectorated Sputum Assessment w Gram Stain, Rflx to Resp Cult   Collection Time: 09/09/24 11:05 PM   Specimen: Expectorated Sputum  Result Value Ref Range   Specimen Description EXPECTORATED SPUTUM    Special Requests NONE    Sputum evaluation      Sputum specimen not  acceptable for testing.  Please recollect.   Performed at [facility], [address] [place], [place] [address]    Report Status 09/10/2024 FINAL   CBC   Collection Time: 09/10/24  4:16 AM  Result Value Ref Range   WBC 8.3 4.0 - 10.5 K/uL   RBC 3.69 (L) 3.87 - 5.11 MIL/uL   Hemoglobin 11.7 (L) 12.0 - 15.0 g/dL   HCT 64.5 (L) 63.9 - 53.9 %   MCV 95.9 80.0 - 100.0 fL   MCH 31.7 26.0 - 34.0 pg   MCHC 33.1 30.0 - 36.0 g/dL   RDW 88.1 88.4 - 84.4 %   Platelets 253 150 - 400 K/uL   nRBC 0.0 0.0 - 0.2 %  Blood gas, venous   Collection Time: 09/10/24  4:16 AM  Result Value Ref Range   pH, Ven 7.42 7.25 - 7.43   pCO2, Ven 56 44 - 60 mmHg   pO2, Ven 51 (H) 32 - 45 mmHg   Bicarbonate 36.3 (H) 20.0 - 28.0 mmol/L   Acid-Base  Excess 9.7 (H) 0.0 - 2.0 mmol/L   O2 Saturation 86.4 %   Patient temperature 37.0    Collection site VEIN   CBC   Collection Time: 09/11/24  5:26 AM  Result Value Ref Range   WBC 11.9 (H) 4.0 - 10.5 K/uL   RBC 3.79 (L) 3.87 - 5.11 MIL/uL   Hemoglobin 12.1 12.0 - 15.0 g/dL   HCT 62.9 63.9 - 53.9 %   MCV 97.6 80.0 - 100.0 fL   MCH 31.9 26.0 - 34.0 pg   MCHC 32.7 30.0 - 36.0 g/dL   RDW 87.7 88.4 - 84.4 %   Platelets 291 150 - 400 K/uL   nRBC 0.0 0.0 - 0.2 %  Basic metabolic panel with GFR   Collection Time: 09/11/24  5:26 AM  Result Value Ref Range   Sodium 140 135 - 145 mmol/L   Potassium 4.8 3.5 - 5.1 mmol/L   Chloride 101 98 - 111 mmol/L   CO2 30 22 - 32 mmol/L   Glucose, Bld 127 (H) 70 - 99 mg/dL   BUN 29 (H) 8 - 23 mg/dL   Creatinine, Ser 9.44 0.44 - 1.00 mg/dL   Calcium  9.0 8.9 - 10.3 mg/dL   GFR, Estimated >39 >39 mL/min   Anion gap 9 5 - 15  C-reactive protein   Collection Time: 09/11/24  7:17 PM  Result Value Ref Range   CRP 0.5 <1.0 mg/dL  CBC   Collection Time: 09/12/24  4:21 AM  Result Value Ref Range   WBC 9.4 4.0 - 10.5 K/uL   RBC 3.82 (L) 3.87 - 5.11 MIL/uL   Hemoglobin 12.2 12.0 - 15.0 g/dL   HCT 62.9 63.9 - 53.9 %    MCV 96.9 80.0 - 100.0 fL   MCH 31.9 26.0 - 34.0 pg   MCHC 33.0 30.0 - 36.0 g/dL   RDW 87.8 88.4 - 84.4 %   Platelets 295 150 - 400 K/uL   nRBC 0.0 0.0 - 0.2 %  Basic metabolic panel with GFR   Collection Time: 09/12/24  4:21 AM  Result Value Ref Range   Sodium 137 135 - 145 mmol/L   Potassium 4.5 3.5 - 5.1 mmol/L   Chloride 100 98 - 111 mmol/L   CO2 29 22 - 32 mmol/L   Glucose, Bld 127 (H) 70 - 99 mg/dL   BUN 25 (H) 8 - 23 mg/dL   Creatinine, Ser 9.42 0.44 - 1.00 mg/dL   Calcium  8.7 (L) 8.9 - 10.3 mg/dL   GFR, Estimated >39 >39 mL/min   Anion gap 8 5 - 15  ECHOCARDIOGRAM COMPLETE BUBBLE STUDY   Collection Time: 09/12/24  2:50 PM  Result Value Ref Range   Ao pk vel 1.00 m/s   AV Area VTI 3.01 cm2   AR max vel 2.68 cm2   AV Mean grad 2.5 mmHg   AV Peak grad 4.0 mmHg   S' Lateral 2.10 cm   AV Area mean vel 2.86 cm2   Area-P 1/2 7.66 cm2   Est EF 65 - 70%   CBC   Collection Time: 09/13/24  5:17 AM  Result Value Ref Range   WBC 10.3 4.0 - 10.5 K/uL   RBC 3.65 (L) 3.87 - 5.11 MIL/uL   Hemoglobin 11.8 (L) 12.0 - 15.0 g/dL   HCT 63.8 63.9 - 53.9 %   MCV 98.9 80.0 - 100.0 fL   MCH 32.3 26.0 - 34.0 pg   MCHC 32.7 30.0 - 36.0 g/dL  RDW 11.9 11.5 - 15.5 %   Platelets 295 150 - 400 K/uL   nRBC 0.0 0.0 - 0.2 %  Basic metabolic panel with GFR   Collection Time: 09/13/24  5:17 AM  Result Value Ref Range   Sodium 138 135 - 145 mmol/L   Potassium 4.0 3.5 - 5.1 mmol/L   Chloride 99 98 - 111 mmol/L   CO2 30 22 - 32 mmol/L   Glucose, Bld 90 70 - 99 mg/dL   BUN 26 (H) 8 - 23 mg/dL   Creatinine, Ser 9.36 0.44 - 1.00 mg/dL   Calcium  8.5 (L) 8.9 - 10.3 mg/dL   GFR, Estimated >39 >39 mL/min   Anion gap 9 5 - 15  CBC   Collection Time: 09/14/24  4:35 AM  Result Value Ref Range   WBC 10.1 4.0 - 10.5 K/uL   RBC 3.76 (L) 3.87 - 5.11 MIL/uL   Hemoglobin 11.9 (L) 12.0 - 15.0 g/dL   HCT 62.3 63.9 - 53.9 %   MCV 100.0 80.0 - 100.0 fL   MCH 31.6 26.0 - 34.0 pg   MCHC 31.6 30.0 - 36.0  g/dL   RDW 88.0 88.4 - 84.4 %   Platelets 285 150 - 400 K/uL   nRBC 0.0 0.0 - 0.2 %  Basic metabolic panel with GFR   Collection Time: 09/14/24  4:35 AM  Result Value Ref Range   Sodium 136 135 - 145 mmol/L   Potassium 4.0 3.5 - 5.1 mmol/L   Chloride 98 98 - 111 mmol/L   CO2 30 22 - 32 mmol/L   Glucose, Bld 89 70 - 99 mg/dL   BUN 28 (H) 8 - 23 mg/dL   Creatinine, Ser 9.47 0.44 - 1.00 mg/dL   Calcium  8.7 (L) 8.9 - 10.3 mg/dL   GFR, Estimated >39 >39 mL/min   Anion gap 8 5 - 15  Basic metabolic panel   Collection Time: 09/15/24  5:47 AM  Result Value Ref Range   Sodium 141 135 - 145 mmol/L   Potassium 4.6 3.5 - 5.1 mmol/L   Chloride 100 98 - 111 mmol/L   CO2 32 22 - 32 mmol/L   Glucose, Bld 89 70 - 99 mg/dL   BUN 23 8 - 23 mg/dL   Creatinine, Ser 9.45 0.44 - 1.00 mg/dL   Calcium  9.1 8.9 - 10.3 mg/dL   GFR, Estimated >39 >39 mL/min   Anion gap 11 5 - 15  CBC   Collection Time: 09/15/24  5:47 AM  Result Value Ref Range   WBC 9.6 4.0 - 10.5 K/uL   RBC 3.77 (L) 3.87 - 5.11 MIL/uL   Hemoglobin 12.0 12.0 - 15.0 g/dL   HCT 62.1 63.9 - 53.9 %   MCV 100.3 (H) 80.0 - 100.0 fL   MCH 31.8 26.0 - 34.0 pg   MCHC 31.7 30.0 - 36.0 g/dL   RDW 88.0 88.4 - 84.4 %   Platelets 295 150 - 400 K/uL   nRBC 0.0 0.0 - 0.2 %      Assessment & Plan:   Problem List Items Addressed This Visit     Centrilobular emphysema (HCC) - Primary   Relevant Orders   Ambulatory referral to Home Health   Edema of both lower extremities   Relevant Medications   furosemide (LASIX) 20 MG tablet   Insomnia   Relevant Medications   QUEtiapine (SEROQUEL) 25 MG tablet   Other Visit Diagnoses       B12 deficiency  Relevant Medications   cyanocobalamin  (VITAMIN B12) injection 1,000 mcg (Completed)   cyanocobalamin  (VITAMIN B12) 1000 MCG/ML injection     Chronic hypoxemic respiratory failure (HCC)       Relevant Orders   Ambulatory referral to Home Health     Shortness of breath         GAD  (generalized anxiety disorder)       Relevant Medications   LORazepam (ATIVAN) 0.5 MG tablet     Constipation, unspecified constipation type       Relevant Medications   docusate sodium (COLACE) 100 MG capsule     Muscle weakness (generalized)       Relevant Orders   Ambulatory referral to Home Health        Chronic obstructive pulmonary disease COPD with recent severe exacerbation and chronic respiratory failure on home oxygen  Recent severe exacerbation required hospitalization due to significant hypoxemia.  Followed now by [facility] Dr Parris Currently on 4 liters of home oxygen  portable oxygen  concentrator  On Breztri , Albuterol  as needed inhaler vs neb,  - Continue home oxygen  at 4 L/min. - Follow up with pulmonologist on October 23rd. In 3 days - Consider pulmonary rehabilitation and physical therapy based on pulmonologist's recommendations.  Generalized Muscle Weakness  With COPD / weakness, placed orders for [facility] PT OT RN AIDE She is unable to safely leave home and has dyspnea with exertion walking  Lower extremity edema Bilateral edema likely secondary to recent hospitalization and steroid treatment. - Prescribe furosemide (Lasix) 20mg  daily as needed for short course, may repeat if need in future - RICE therapy  Anxiety disorder Insomnia Exacerbated by recent hospitalization and confusion regarding lung health. Quetiapine beneficial for sleep, buspirone  used as needed for anxiety, lorazepam used during hospitalization for acute anxiety. - Prescribe quetiapine (Seroquel) for sleep. Continuation from hospitalization 25mg  nightly with good results. - Prescribe buspirone  (Buspar ) 7.5 mg twice daily as needed for anxiety. Note she was on this prior to hospital, and it is effective for her, encouraged her to take this most often. - Discharged w/ benzodiazpine Lorazepam 0.5mg  as needed breakthrough anxiety. I re ordered this rx today  Insomnia Managed with  quetiapine, effective for sleep. - Continue quetiapine (Seroquel) for sleep.  Vitamin B12 deficiency Managed with monthly B12 injections. Received an extra injection during hospitalization. Completed 1 month injections, switch to monthly now - Reschedule next B12 injection to one month from the last dose.  Constipation Managed with stool softeners, effective and non-harsh. - Prescribe colace stool softener, advise to take one or two as needed.  General Health Maintenance Discussion of vitamin supplementation. Consumes tangerines for vitamin C. - Recommend vitamin D  supplementation if not already taking.      Orders Placed This Encounter  Procedures   Ambulatory referral to Home Health    Referral Priority:   Routine    Referral Type:   Home Health Care    Referral Reason:   Specialty Services Required    Requested Specialty:   Home Health Services    Number of Visits Requested:   1     Meds ordered this encounter  Medications   cyanocobalamin  (VITAMIN B12) injection 1,000 mcg   docusate sodium (COLACE) 100 MG capsule    Sig: Take 1 capsule (100 mg total) by mouth daily as needed for mild constipation or moderate constipation.    Dispense:  90 capsule    Refill:  1   QUEtiapine (SEROQUEL) 25 MG tablet  Sig: Take 1 tablet (25 mg total) by mouth at bedtime.    Dispense:  90 tablet    Refill:  1   furosemide (LASIX) 20 MG tablet    Sig: Take 1 tablet (20 mg total) by mouth daily.    Dispense:  30 tablet    Refill:  1   LORazepam (ATIVAN) 0.5 MG tablet    Sig: Take 1 tablet (0.5 mg total) by mouth every 8 (eight) hours as needed for anxiety.    Dispense:  20 tablet    Refill:  2   cyanocobalamin  (VITAMIN B12) 1000 MCG/ML injection    Sig: Inject 1 mL (1,000 mcg total) into the muscle every 30 (thirty) days. For 4 months    Dispense:  4 mL    Refill:  0    Follow up plan: Return in about 4 weeks (around 10/23/2024) for 4 week follow-up COPD updates.   [name], DO [facility] 09/25/2024, 5:40 PM

## 2024-09-25 NOTE — Patient Instructions (Addendum)
 Thank you for coming to the office today.  For Anxiety  Take Buspar  7.5 twice a day as needed   May also take Lorazepam 0.5mg  if you have more severe or breakthrough anxiety, use as needed, caution sedation  Refilled stool softener  Continue Oxygen   Pulmonology 10/23 - w/ future pulm rehab  Refilled Seroquel for sleep  Continue vitamins, B12 injections now monthly.  Please schedule a Follow-up Appointment to: Return in about 4 weeks (around 10/23/2024) for 4 week follow-up COPD updates.  If you have any other questions or concerns, please feel free to call the office or send a message through MyChart. You may also schedule an earlier appointment if necessary.  Additionally, you may be receiving a survey about your experience at our office within a few days to 1 week by e-mail or mail. We value your feedback.  [name], DO [facility], [place]

## 2024-09-26 ENCOUNTER — Other Ambulatory Visit: Payer: Self-pay

## 2024-09-26 NOTE — Transitions of Care (Post Inpatient/ED Visit) (Signed)
 Transition of Care week 2  Visit Note  09/26/2024  Name: Brandi [name] MRN: [id number]          DOB: 1955-12-18  Situation: Patient enrolled in [facility] 30-day program. Visit completed with Allena Simpler by telephone.   Background:   Initial Transition Care Management Follow-up Telephone Call Discharge Date and Diagnosis: 09/16/24, COPD   Past Medical History:  Diagnosis Date   Anxiety    COPD (chronic obstructive pulmonary disease) (HCC)    Hyperlipidemia     Assessment: Patient Reported Symptoms: Cognitive Cognitive Status: Alert and oriented to person, place, and time, Normal speech and language skills      Neurological Neurological Review of Symptoms: No symptoms reported    HEENT HEENT Symptoms Reported: No symptoms reported      Cardiovascular Cardiovascular Symptoms Reported: Swelling in legs or feet Does patient have uncontrolled Hypertension?: No Cardiovascular Management Strategies: Medication therapy, Routine screening, Coping strategies Cardiovascular Comment: Elevate BLE  Respiratory Respiratory Symptoms Reported: No symptoms reported Additional Respiratory Details: She wears her oxygen  at night. She like the Nebulizer and uses that. She does like the inhalers much. She does have an appointment with Pulmonology tommorw. She is not SOB and ia able to walk to the barn where her horses ae Respiratory Management Strategies: Routine screening, Medication therapy, Activity, Adequate rest, Oxygen  therapy  Endocrine Endocrine Symptoms Reported: No symptoms reported Is patient diabetic?: No    Gastrointestinal Gastrointestinal Symptoms Reported: No symptoms reported      Genitourinary Genitourinary Symptoms Reported: No symptoms reported    Integumentary Integumentary Symptoms Reported: No symptoms reported    Musculoskeletal Musculoskelatal Symptoms Reviewed: Weakness, Unsteady gait Musculoskeletal Management Strategies: Activity, Adequate rest, Routine  screening Musculoskeletal Comment: HHPT was ordered on 09/25/24      Psychosocial Psychosocial Symptoms Reported: No symptoms reported         There were no vitals filed for this visit.  Medications Reviewed Today     Reviewed by Moises Reusing, RN (Case Manager) on 09/26/24 at 1108  Med List Status: <None>   Medication Order Taking? Sig Documenting Provider Last Dose Status Informant  albuterol  (PROVENTIL ) (2.5 MG/3ML) 0.083% nebulizer solution [id number]  USE 1 VIAL VIA NEBULIZER EVERY 6 HOURS AS NEEDED FOR WHEEZING OR SHORTNESS OF [name], MD  Active Self  albuterol  (VENTOLIN  HFA) 108 (90 Base) MCG/ACT inhaler [id number]  INHALE 2 PUFFS INTO THE LUNGS EVERY 4 HOURS AS NEEDED FOR WHEEZING OR SHORTNESS OF BREATH  Patient not taking: Reported on 09/25/2024   Edman [name], DO  Active Self  alendronate  (FOSAMAX ) 70 MG tablet [id number]  Take 1 tablet (70 mg total) by mouth once a week. Edman [name], DO  Active Self  aspirin  EC 81 MG tablet [id number]  Take 1 tablet (81 mg total) by mouth daily. Gollan, [name], MD  Active Self  benzonatate (TESSALON) 100 MG capsule [id number]  Take 1 capsule (100 mg total) by mouth 3 (three) times daily as needed for cough. Cox, Amy N, DO  Active   budesonide-glycopyrrolate -formoterol (BREZTRI  AEROSPHERE) 160-9-4.8 MCG/ACT AERO inhaler [id number]  Inhale 2 puffs into the lungs in the morning and at bedtime. Tamea Dedra LITTIE, MD  Active Self  busPIRone  (BUSPAR ) 7.5 MG tablet [id number]  Take 1 tablet (7.5 mg total) by mouth 2 (two) times daily as needed. for anxiety Edman [name], DO  Active Self  Cholecalciferol (VITAMIN D3) 125 MCG (5000 UT) TABS [id number]  Take by mouth. Vitamin D3 5,000 IU  daily for 12 weeks then reduce to OTC Vitamin D3 2,000 IU daily for maintenance [provider]  Active Self  cyanocobalamin  (VITAMIN B12) 1000 MCG/ML injection [id number]  Inject 1 mL (1,000 mcg total) into  the muscle every 30 (thirty) days. For 4 months Edman [name], DO  Active   docusate sodium (COLACE) 100 MG capsule [id number]  Take 1 capsule (100 mg total) by mouth daily as needed for mild constipation or moderate constipation. Edman [name], DO  Active   famotidine  (PEPCID ) 20 MG tablet [id number]  TAKE 1 TABLET BY MOUTH TWICE DAILY AS NEEDED Edman [name], DO  Active Self  feeding supplement (ENSURE ENLIVE / ENSURE PLUS) LIQD [id number]  Take 237 mLs by mouth 2 (two) times daily between meals.  Patient not taking: Reported on 09/25/2024   [name], DO  Active Self  furosemide (LASIX) 20 MG tablet [id number]  Take 1 tablet (20 mg total) by mouth daily. [name], [name], DO  Active   ipratropium-albuterol  (DUONEB) 0.5-2.5 (3) MG/3ML SOLN [id number]  Take 3 mLs by nebulization 3 (three) times daily. Cox, Amy N, DO  Active   LORazepam (ATIVAN) 0.5 MG tablet [id number]  Take 1 tablet (0.5 mg total) by mouth every 8 (eight) hours as needed for anxiety. Edman [name], DO  Active   montelukast  (SINGULAIR ) 10 MG tablet [id number]  TAKE 1 TABLET BY MOUTH AT BEDTIME Edman [name], DO  Active Self  Multiple Vitamin (MULTIVITAMIN WITH MINERALS) TABS tablet [id number]  Take 1 tablet by mouth daily. Fausto Sor LABOR, DO  Active Self           Med Note [name]   Dju Sep 09, 2024 10:43 AM)    omeprazole  (PRILOSEC) 20 MG capsule [id number]  Take 1 capsule (20 mg total) by mouth 2 (two) times daily before a meal. Edman, [name], DO  Active Self  ondansetron  (ZOFRAN ) 4 MG tablet [id number]  Take 1 tablet (4 mg total) by mouth every 6 (six) hours as needed for nausea or vomiting. CoxGreig SAILOR, DO  Active   OXYGEN  [id number]  Inhale 3 L into the lungs at bedtime as needed. [provider]  Active Self           Med Note [name], JACKSON SAILOR   Wed Nov 17, 2023  2:51 PM) Reports currently using 4 min/l  QUEtiapine (SEROQUEL) 25  MG tablet [id number]  Take 1 tablet (25 mg total) by mouth at bedtime. Edman [name], DO  Active   rosuvastatin  (CRESTOR ) 10 MG tablet [id number]  Take 1 tablet (10 mg total) by mouth daily. Gollan, [name], MD  Active Self           Med Note [name]   Dju Sep 09, 2024 10:44 AM)    sodium chloride  (OCEAN) 0.65 % SOLN nasal spray [id number]  Place 1 spray into both nostrils as needed for congestion. [provider]  Active Self  Specialty Vitamins Products (HAIR NOURISHING SUPPLEMENT PO) [id number]  Take 1 tablet by mouth daily. [provider]  Active Self            Recommendation:   Continue Current Plan of Care  Follow Up Plan:   Telephone follow-up in 1 week  Medford Balboa, BSN, RN [place]  VBCI - [facility] RN Care Manager [phone]

## 2024-09-26 NOTE — Patient Instructions (Signed)
 Visit Information  Thank you for taking time to visit with me today. Please don't hesitate to contact me if I can be of assistance to you before our next scheduled telephone appointment.  Our next appointment is by telephone on Tuesday October 28th at 11:00am  Following is a copy of your care plan:   Goals Addressed             This Visit's Progress    VBCI Transitions of Care (TOC) Care Plan       Problems: (reviewed 09/26/24) Recent Hospitalization for treatment of COPD Hospital  or ED Adm 89%  Goal: (reviewed 09/26/24) Over the next 30 days, the patient will not experience hospital readmission  Interventions: (reviewed 09/26/24)  COPD Interventions:  (reviewed 09/26/24) Advised patient to track and manage COPD triggers Assessed social determinant of health barriers Discussed the importance of adequate rest and management of fatigue with COPD Provided instruction about proper use of medications used for management of COPD including inhalers Screening for signs and symptoms of depression related to chronic disease state  Use of home oxygen  3-4l/min PRN at bedtime Home Health PT/OT  Patient Self Care Activities:  Attend all scheduled provider appointments Call pharmacy for medication refills 3-7 days in advance of running out of medications Call provider office for new concerns or questions  Notify RN Care Manager of [facility] call rescheduling needs Participate in Transition of Care Program/Attend TOC scheduled calls Perform all self care activities independently  Take medications as prescribed   Lasix Daily for edema to BLE Elevated extremities when sitting Quetiapin at night as needed for insomnia  Plan:  Telephone follow up appointment with care management team member scheduled for:  Tuesday October 28that 11:00am        Patient verbalizes understanding of instructions and care plan provided today and agrees to view in [place]. Active MyChart status and patient  understanding of how to access instructions and care plan via MyChart confirmed with patient.     The patient has been provided with contact information for the care management team and has been advised to call with any health related questions or concerns.   Please call the care guide team at [phone] if you need to cancel or reschedule your appointment.   Please call the Suicide and Crisis Lifeline: 988 call the [place]  National Suicide Prevention Lifeline: 941-322-7159 or TTY: 7795096859 TTY (502) 356-5780) to talk to a trained counselor if you are experiencing a Mental Health or Behavioral Health Crisis or need someone to talk to.  Medford Balboa, BSN, RN [place]  VBCI - [employer] [facility] RN Care Manager [phone]

## 2024-09-27 ENCOUNTER — Ambulatory Visit

## 2024-09-27 DIAGNOSIS — J449 Chronic obstructive pulmonary disease, unspecified: Secondary | ICD-10-CM | POA: Diagnosis not present

## 2024-09-27 DIAGNOSIS — Z008 Encounter for other general examination: Secondary | ICD-10-CM | POA: Diagnosis not present

## 2024-09-28 ENCOUNTER — Telehealth: Payer: Self-pay

## 2024-09-28 ENCOUNTER — Telehealth: Payer: Self-pay | Admitting: Family Medicine

## 2024-09-28 ENCOUNTER — Other Ambulatory Visit: Payer: Self-pay | Admitting: Family Medicine

## 2024-09-28 DIAGNOSIS — F5104 Psychophysiologic insomnia: Secondary | ICD-10-CM

## 2024-09-28 DIAGNOSIS — J432 Centrilobular emphysema: Secondary | ICD-10-CM

## 2024-09-28 NOTE — Telephone Encounter (Unsigned)
 Copied from CRM [id number]. Topic: Clinical - Medication Refill >> Sep 28, 2024  4:08 PM Wess RAMAN wrote: Medication: montelukast  (SINGULAIR ) 10 MG tablet  QUEtiapine (SEROQUEL) 25 MG tablet   Has the patient contacted their pharmacy? No (Agent: If no, request that the patient contact the pharmacy for the refill. If patient does not wish to contact the pharmacy document the reason why and proceed with request.) (Agent: If yes, when and what did the pharmacy advise?)  This is the patient's preferred pharmacy:   [facility] - [place] - [address] [place] [address] Phone: [phone] Fax: [phone]  Is this the correct pharmacy for this prescription? Yes If no, delete pharmacy and type the correct one.   Has the prescription been filled recently? Yes  Is the patient out of the medication? No  Has the patient been seen for an appointment in the last year OR does the patient have an upcoming appointment? Yes  Can we respond through MyChart? Yes  Agent: Please be advised that Rx refills may take up to 3 business days. We ask that you follow-up with your pharmacy.

## 2024-09-28 NOTE — Telephone Encounter (Signed)
 Copied from CRM [id number]. Topic: Clinical - Home Health Verbal Orders >> Sep 28, 2024 10:54 AM Hadassah PARAS wrote: Caller/AgencyBETHA [facility] Callback Number: [phone]  Any new concerns about the patient?HH services, not going to be able to see her until they have approval from insurance. Move app out to Tuesday Oct. 28th

## 2024-09-28 NOTE — Telephone Encounter (Unsigned)
 Copied from CRM [id number]. Topic: Clinical - Prescription Issue >> Sep 28, 2024  4:19 PM Wess RAMAN wrote: Reason for CRM: Patient stated she needed a refill of Azithromycin  250 MG, 3 times per week (Mon, Wed, Fri). Her pulmonologist advised her to get it refilled. I informed her that this medication was not on her current medications.  Callback #: [phone]  Pharmacy: [facility] - [place], [place] - [address] [place] [address] Phone: [phone] Fax: [phone] Hours: Not open 24 hours

## 2024-09-29 MED ORDER — MONTELUKAST SODIUM 10 MG PO TABS: 10.0000 mg | ORAL_TABLET | Freq: Every day | ORAL | 1 refills | Status: DC

## 2024-09-29 MED ORDER — AZITHROMYCIN 250 MG PO TABS: 250.0000 mg | ORAL_TABLET | ORAL | 1 refills | Status: DC

## 2024-09-29 NOTE — Telephone Encounter (Signed)
 As long as she continues to follow with her Pulmonologist, I am okay to refill this. Rx sent Azithromycin  3 times per week for 3 months, + 1 refill sent to [facility]  [name], DO [facility] 09/29/2024, 11:56 AM

## 2024-09-29 NOTE — Telephone Encounter (Signed)
 Already refilled on 09/25/24 in a separate refill encounter.

## 2024-09-29 NOTE — Telephone Encounter (Signed)
 Requested Prescriptions  Pending Prescriptions Disp Refills   montelukast  (SINGULAIR ) 10 MG tablet 90 tablet 1    Sig: Take 1 tablet (10 mg total) by mouth at bedtime.     Pulmonology:  Leukotriene Inhibitors Passed - 09/29/2024 10:35 PM      Passed - Valid encounter within last 12 months    Recent Outpatient Visits           4 days ago Centrilobular emphysema [facility])   [place] [facility] [place], [name], DO   4 weeks ago Acute bronchitis with COPD [facility])   [place] [facility] Edman [name], DO   2 months ago Centrilobular emphysema [facility]   [place] [facility] [place], Angeline ORN, NP   4 months ago Stage 3 severe COPD by GOLD classification [facility])   [facility] [place], [name], DO   5 months ago Gastroesophageal reflux disease without esophagitis   [place] [facility] [place], [name], [place]

## 2024-09-29 NOTE — Telephone Encounter (Signed)
 Patient notified prescription sent into pharmacy. Also notified she needs to continue to follow up with pulmonary.

## 2024-10-02 NOTE — Telephone Encounter (Signed)
 Nat notified at [facility]

## 2024-10-02 NOTE — Telephone Encounter (Signed)
 Okay thank you. Yes start date tomorrow 10/03/24 should be fine.  Thank you!  [name], DO [facility] 10/02/2024, 1:28 PM

## 2024-10-02 NOTE — Telephone Encounter (Unsigned)
 Copied from CRM [id number]. Topic: Clinical - Home Health Verbal Orders >> Oct 02, 2024  1:17 PM Fonda T wrote: Caller/Agency: [facility] Callback Number: [phone]  Caller states she is calling to inform that she has received approval today from patient's insurance to begin home health, and wanted to know if a start date for tomorrow, 10/03/24 would be ok.  Can be reached back with a follow up call to [phone]

## 2024-10-03 ENCOUNTER — Other Ambulatory Visit: Payer: Self-pay | Admitting: Family Medicine

## 2024-10-03 ENCOUNTER — Other Ambulatory Visit: Payer: Self-pay

## 2024-10-03 NOTE — Transitions of Care (Post Inpatient/ED Visit) (Signed)
 Transition of Care week 3  Visit Note  10/03/2024  Name: Brandi [name] MRN: [id number]          DOB: [birth year]  Situation: Patient enrolled in [facility] 30-day program. Visit completed with Allena Simpler by telephone.   Background:   Initial Transition Care Management Follow-up Telephone Call Discharge Date and Diagnosis: 09/16/24, COPD   Past Medical History:  Diagnosis Date   Anxiety    COPD (chronic obstructive pulmonary disease) (HCC)    Hyperlipidemia     Assessment: Patient Reported Symptoms: Cognitive Cognitive Status: Alert and oriented to person, place, and time, Normal speech and language skills      Neurological Neurological Review of Symptoms: No symptoms reported    HEENT HEENT Symptoms Reported: No symptoms reported      Cardiovascular Cardiovascular Symptoms Reported: No symptoms reported Does patient have uncontrolled Hypertension?: No Cardiovascular Management Strategies: Medication therapy, Routine screening, Coping strategies  Respiratory      Endocrine Endocrine Symptoms Reported: No symptoms reported    Gastrointestinal Gastrointestinal Symptoms Reported: No symptoms reported      Genitourinary Genitourinary Symptoms Reported: No symptoms reported    Integumentary Integumentary Symptoms Reported: No symptoms reported    Musculoskeletal Musculoskelatal Symptoms Reviewed: Weakness Musculoskeletal Comment: HHPT to start 10/03/24      Psychosocial Psychosocial Symptoms Reported: No symptoms reported         There were no vitals filed for this visit.  Medications Reviewed Today     Reviewed by Moises Reusing, RN (Case Manager) on 10/03/24 at 1118  Med List Status: <None>   Medication Order Taking? Sig Documenting Provider Last Dose Status Informant  albuterol  (PROVENTIL ) (2.5 MG/3ML) 0.083% nebulizer solution [id number]  USE 1 VIAL VIA NEBULIZER EVERY 6 HOURS AS NEEDED FOR WHEEZING OR SHORTNESS OF [name], MD  Active Self   albuterol  (VENTOLIN  HFA) 108 (90 Base) MCG/ACT inhaler [id number]  INHALE 2 PUFFS INTO THE LUNGS EVERY 4 HOURS AS NEEDED FOR WHEEZING OR SHORTNESS OF BREATH  Patient not taking: Reported on 09/25/2024   Edman [name], DO  Active Self  alendronate  (FOSAMAX ) 70 MG tablet [id number]  Take 1 tablet (70 mg total) by mouth once a week. Edman [name], DO  Active Self  aspirin  EC 81 MG tablet [id number]  Take 1 tablet (81 mg total) by mouth daily. Gollan, [name], MD  Active Self  azithromycin  (ZITHROMAX  Z-PAK) 250 MG tablet [id number]  Take 1 tablet (250 mg total) by mouth 3 (three) times a week. Mon-Weds-Fri [name], [name], DO  Active   benzonatate (TESSALON) 100 MG capsule [id number]  Take 1 capsule (100 mg total) by mouth 3 (three) times daily as needed for cough. Cox, Amy N, DO  Active   budesonide-glycopyrrolate -formoterol (BREZTRI  AEROSPHERE) 160-9-4.8 MCG/ACT AERO inhaler [id number]  Inhale 2 puffs into the lungs in the morning and at bedtime. Tamea Dedra LITTIE, MD  Active Self  busPIRone  (BUSPAR ) 7.5 MG tablet [id number]  Take 1 tablet (7.5 mg total) by mouth 2 (two) times daily as needed. for anxiety Edman [name], DO  Active Self  Cholecalciferol (VITAMIN D3) 125 MCG (5000 UT) TABS [id number]  Take by mouth. Vitamin D3 5,000 IU daily for 12 weeks then reduce to OTC Vitamin D3 2,000 IU daily for maintenance [provider]  Active Self  cyanocobalamin  (VITAMIN B12) 1000 MCG/ML injection [id number]  Inject 1 mL (1,000 mcg total) into the muscle every 30 (thirty) days. For 4 months Edman [name], DO  Active   docusate sodium (COLACE) 100 MG capsule [id number]  Take 1 capsule (100 mg total) by mouth daily as needed for mild constipation or moderate constipation. Edman [name], DO  Active   famotidine  (PEPCID ) 20 MG tablet [id number]  TAKE 1 TABLET BY MOUTH TWICE DAILY AS NEEDED Edman [name], DO  Active Self  feeding  supplement (ENSURE ENLIVE / ENSURE PLUS) LIQD [id number]  Take 237 mLs by mouth 2 (two) times daily between meals.  Patient not taking: Reported on 09/25/2024   [name], DO  Active Self  furosemide (LASIX) 20 MG tablet [id number]  Take 1 tablet (20 mg total) by mouth daily. [name], [name], DO  Active   ipratropium-albuterol  (DUONEB) 0.5-2.5 (3) MG/3ML SOLN [id number]  Take 3 mLs by nebulization 3 (three) times daily. Cox, Amy N, DO  Active   LORazepam (ATIVAN) 0.5 MG tablet [id number]  Take 1 tablet (0.5 mg total) by mouth every 8 (eight) hours as needed for anxiety. Edman [name], DO  Active   montelukast  (SINGULAIR ) 10 MG tablet [id number]  Take 1 tablet (10 mg total) by mouth at bedtime. Edman [name], DO  Active   Multiple Vitamin (MULTIVITAMIN WITH MINERALS) TABS tablet [id number]  Take 1 tablet by mouth daily. Fausto Sor LABOR, DO  Active Self           Med Note [name]   Dju Sep 09, 2024 10:43 AM)    omeprazole  (PRILOSEC) 20 MG capsule [id number]  Take 1 capsule (20 mg total) by mouth 2 (two) times daily before a meal. Edman, [name], DO  Active Self  ondansetron  (ZOFRAN ) 4 MG tablet [id number]  Take 1 tablet (4 mg total) by mouth every 6 (six) hours as needed for nausea or vomiting. Cox, Amy N, DO  Active   OXYGEN  [id number]  Inhale 3 L into the lungs at bedtime as needed. [provider]  Active Self           Med Note [name], JACKSON SAILOR   Wed Nov 17, 2023  2:51 PM) Reports currently using 4 min/l  QUEtiapine (SEROQUEL) 25 MG tablet [id number]  Take 1 tablet (25 mg total) by mouth at bedtime. [name], [name], DO  Active   rosuvastatin  (CRESTOR ) 10 MG tablet [id number]  Take 1 tablet (10 mg total) by mouth daily. Gollan, [name], MD  Active Self           Med Note [name]   Dju Sep 09, 2024 10:44 AM)    sodium chloride  (OCEAN) 0.65 % SOLN nasal spray [id number]  Place 1 spray into both nostrils as needed  for congestion. [provider]  Active Self  Specialty Vitamins Products (HAIR NOURISHING SUPPLEMENT PO) [id number]  Take 1 tablet by mouth daily. [provider]  Active Self            Recommendation:   Continue Current Plan of Care  Follow Up Plan:   Telephone follow-up in 1 week  Medford Balboa, BSN, RN [place]  VBCI - [facility] RN Care Manager [phone]

## 2024-10-03 NOTE — Patient Instructions (Signed)
 Visit Information  Thank you for taking time to visit with me today. Please don't hesitate to contact me if I can be of assistance to you before our next scheduled telephone appointment.  Our next appointment is by telephone on Wednesday November 5th at 10:30am  Following is a copy of your care plan:   Goals Addressed             This Visit's Progress    VBCI Transitions of Care (TOC) Care Plan       Problems: (reviewed 10/03/24) Recent Hospitalization for treatment of COPD Hospital  or ED Adm 89%  Goal: (reviewed 10/03/24) Over the next 30 days, the patient will not experience hospital readmission  Interventions: (reviewed 10/03/24)  COPD Interventions:  (reviewed 10/03/24) Advised patient to track and manage COPD triggers Assessed social determinant of health barriers Discussed the importance of adequate rest and management of fatigue with COPD Provided instruction about proper use of medications used for management of COPD including inhalers Screening for signs and symptoms of depression related to chronic disease state  Use of home oxygen  3-4l/min PRN at bedtime Home Health PT/OT Prophylaxis antibiotic Bactrim  400-80mg  Monday, Wednesday, Friday Follow up with [facility] Pulmonologist Dr. Juanita  Patient Self Care Activities: (reviewed 10/03/24) Attend all scheduled provider appointments Call pharmacy for medication refills 3-7 days in advance of running out of medications Call provider office for new concerns or questions  Notify RN Care Manager of [facility] call rescheduling needs Participate in Transition of Care Program/Attend [facility] scheduled calls Perform all self care activities independently  Take medications as prescribed   Lasix Daily for edema to BLE Elevated extremities when sitting Quetiapin at night as needed for insomnia  Plan:  Telephone follow up appointment with care management team member scheduled for:  Wednesday November 5th at 10:30am        Patient  verbalizes understanding of instructions and care plan provided today and agrees to view in [place]. Active MyChart status and patient understanding of how to access instructions and care plan via MyChart confirmed with patient.     The patient has been provided with contact information for the care management team and has been advised to call with any health related questions or concerns.   Please call the care guide team at [phone] if you need to cancel or reschedule your appointment.   Please call the Suicide and Crisis Lifeline: 988 call the [place]  National Suicide Prevention Lifeline: (712)613-9058 or TTY: (845) 248-1873 TTY 9286749402) to talk to a trained counselor if you are experiencing a Mental Health or Behavioral Health Crisis or need someone to talk to.  Medford Balboa, BSN, RN [place]  VBCI - [employer] [facility] RN Care Manager [phone]

## 2024-10-11 ENCOUNTER — Other Ambulatory Visit: Payer: Self-pay

## 2024-10-11 NOTE — Patient Instructions (Signed)
 Visit Information  Thank you for taking time to visit with me today. Please don't hesitate to contact me if I can be of assistance to you before our next scheduled telephone appointment.  Our next appointment is by telephone on Wednesday November 12th at 11:00am  Following is a copy of your care plan:   Goals Addressed             This Visit's Progress    VBCI Transitions of Care (TOC) Care Plan       Problems: (reviewed 10/03/24) Recent Hospitalization for treatment of COPD Hospital  or ED Adm 89%  Goal: (reviewed 10/03/24) Over the next 30 days, the patient will not experience hospital readmission  Interventions: (reviewed 10/03/24)  COPD Interventions:  (reviewed 10/11/24) Advised patient to track and manage COPD triggers Assessed social determinant of health barriers Discussed the importance of adequate rest and management of fatigue with COPD Provided instruction about proper use of medications used for management of COPD including inhalers Screening for signs and symptoms of depression related to chronic disease state  Use of home oxygen  3-4l/min PRN at bedtime Home Health PT/OT Prophylaxis antibiotic Bactrim  400-80mg  Monday, Wednesday, Friday Follow up with [facility] Pulmonologist Dr. Juanita - Completed 10/04/24. Next appointment is November 19th  Patient Self Care Activities: (reviewed 10/11/24) Attend all scheduled provider appointments Call pharmacy for medication refills 3-7 days in advance of running out of medications Call provider office for new concerns or questions  Notify RN Care Manager of [facility] call rescheduling needs Participate in Transition of Care Program/Attend [facility] scheduled calls Perform all self care activities independently  Take medications as prescribed   Lasix Daily for edema to BLE Elevated extremities when sitting Quetiapin at night as needed for insomnia  Plan:  Telephone follow up appointment with care management team member scheduled for:   Wednesday November 12th at 11:00amam        Patient verbalizes understanding of instructions and care plan provided today and agrees to view in MyChart. Active MyChart status and patient understanding of how to access instructions and care plan via MyChart confirmed with patient.     The patient has been provided with contact information for the care management team and has been advised to call with any health related questions or concerns.   Please call the care guide team at [phone] if you need to cancel or reschedule your appointment.   Please call the Suicide and Crisis Lifeline: 988 call the [place]  National Suicide Prevention Lifeline: 657-360-9933 or TTY: 5306639105 TTY (703)659-8024) to talk to a trained counselor if you are experiencing a Mental Health or Behavioral Health Crisis or need someone to talk to.  Medford Balboa, BSN, RN [place]  VBCI - [employer] [facility] RN Care Manager [phone]

## 2024-10-11 NOTE — Transitions of Care (Post Inpatient/ED Visit) (Signed)
Transition of Care week 4  Visit Note  10/11/2024  Name: Brandi [name] MRN: [id number]          DOB: 1956/08/12  Situation: Patient enrolled in [facility] 30-day program. Visit completed with Allena Simpler by telephone.   Background:   Initial Transition Care Management Follow-up Telephone Call Discharge Date and Diagnosis: 09/16/24, COPD   Past Medical History:  Diagnosis Date   Anxiety    COPD (chronic obstructive pulmonary disease) (HCC)    Hyperlipidemia     Assessment: Patient Reported Symptoms: Cognitive Cognitive Status: Alert and oriented to person, place, and time, Normal speech and language skills      Neurological Neurological Review of Symptoms: Not assessed    HEENT HEENT Symptoms Reported: Not assessed      Cardiovascular Cardiovascular Symptoms Reported: No symptoms reported Cardiovascular Management Strategies: Medication therapy, Routine screening, Coping strategies  Respiratory Respiratory Symptoms Reported: No symptoms reported Additional Respiratory Details: She went to see the Pulmonolgist who states that she may be able to decrease her Oxygen  demands. She is working with PT and is active around the house. Using her nebulizers and inhalers Respiratory Management Strategies: Routine screening, Medication therapy, Adequate rest, Oxygen  therapy  Endocrine Endocrine Symptoms Reported: Not assessed    Gastrointestinal Gastrointestinal Symptoms Reported: Not assessed      Genitourinary Genitourinary Symptoms Reported: Not assessed    Integumentary Integumentary Symptoms Reported: No symptoms reported    Musculoskeletal Musculoskelatal Symptoms Reviewed: Weakness Musculoskeletal Management Strategies: Activity, Adequate rest, Routine screening, Exercise Musculoskeletal Comment: HHPT and OT have started therapy      Psychosocial Psychosocial Symptoms Reported: Not assessed         There were no vitals filed for this visit.  Medications Reviewed Today      Reviewed by Moises Reusing, RN (Case Manager) on 10/11/24 at 1136  Med List Status: <None>   Medication Order Taking? Sig Documenting Provider Last Dose Status Informant  albuterol  (PROVENTIL ) (2.5 MG/3ML) 0.083% nebulizer solution [id number]  USE 1 VIAL VIA NEBULIZER EVERY 6 HOURS AS NEEDED FOR WHEEZING OR SHORTNESS OF [name], MD  Active Self  albuterol  (VENTOLIN  HFA) 108 (90 Base) MCG/ACT inhaler [id number]  INHALE 2 PUFFS INTO THE LUNGS EVERY 4 HOURS AS NEEDED FOR WHEEZING OR SHORTNESS OF BREATH  Patient not taking: Reported on 09/25/2024   Edman [name], DO  Active Self  alendronate  (FOSAMAX ) 70 MG tablet [id number]  Take 1 tablet (70 mg total) by mouth once a week. Edman [name], DO  Active Self  aspirin  EC 81 MG tablet [id number]  Take 1 tablet (81 mg total) by mouth daily. Gollan, [name], MD  Active Self  benzonatate (TESSALON) 100 MG capsule [id number]  Take 1 capsule (100 mg total) by mouth 3 (three) times daily as needed for cough. Cox, Amy N, DO  Active   budesonide-glycopyrrolate -formoterol (BREZTRI  AEROSPHERE) 160-9-4.8 MCG/ACT AERO inhaler [id number]  Inhale 2 puffs into the lungs in the morning and at bedtime. Tamea Dedra LITTIE, MD  Active Self  busPIRone  (BUSPAR ) 7.5 MG tablet [id number]  Take 1 tablet (7.5 mg total) by mouth 2 (two) times daily as needed. for anxiety Edman [name], DO  Active Self  Cholecalciferol (VITAMIN D3) 125 MCG (5000 UT) TABS [id number]  Take by mouth. Vitamin D3 5,000 IU daily for 12 weeks then reduce to OTC Vitamin D3 2,000 IU daily for maintenance [provider]  Active Self  cyanocobalamin  (VITAMIN B12) 1000 MCG/ML injection [id number]  Inject 1  mL (1,000 mcg total) into the muscle every 30 (thirty) days. For 4 months Edman [name], DO  Active   docusate sodium (COLACE) 100 MG capsule [id number]  Take 1 capsule (100 mg total) by mouth daily as needed for mild constipation or moderate  constipation. Edman [name], DO  Active   famotidine  (PEPCID ) 20 MG tablet [id number]  TAKE 1 TABLET BY MOUTH TWICE DAILY AS NEEDED Edman [name], DO  Active Self  feeding supplement (ENSURE ENLIVE / ENSURE PLUS) LIQD [id number]  Take 237 mLs by mouth 2 (two) times daily between meals.  Patient not taking: Reported on 09/25/2024   [name], DO  Active Self  furosemide (LASIX) 20 MG tablet [id number]  Take 1 tablet (20 mg total) by mouth daily. [name], [name], DO  Active   ipratropium-albuterol  (DUONEB) 0.5-2.5 (3) MG/3ML SOLN [id number]  Take 3 mLs by nebulization 3 (three) times daily. Cox, Amy N, DO  Active   LORazepam (ATIVAN) 0.5 MG tablet [id number]  Take 1 tablet (0.5 mg total) by mouth every 8 (eight) hours as needed for anxiety. Edman [name], DO  Active   montelukast  (SINGULAIR ) 10 MG tablet [id number]  Take 1 tablet (10 mg total) by mouth at bedtime. Edman [name], DO  Active   Multiple Vitamin (MULTIVITAMIN WITH MINERALS) TABS tablet [id number]  Take 1 tablet by mouth daily. Fausto Sor LABOR, DO  Active Self           Med Note [name]   Dju Sep 09, 2024 10:43 AM)    omeprazole  (PRILOSEC) 20 MG capsule [id number]  Take 1 capsule (20 mg total) by mouth 2 (two) times daily before a meal. Edman, [name], DO  Active Self  ondansetron  (ZOFRAN ) 4 MG tablet [id number]  Take 1 tablet (4 mg total) by mouth every 6 (six) hours as needed for nausea or vomiting. Cox, Amy N, DO  Active   OXYGEN  [id number]  Inhale 3 L into the lungs at bedtime as needed. [provider]  Active Self           Med Note [name], JACKSON SAILOR   Wed Nov 17, 2023  2:51 PM) Reports currently using 4 min/l  predniSONE  (DELTASONE ) 5 MG tablet [id number]  Take 5 mg by mouth daily with breakfast. [provider]  Active   QUEtiapine (SEROQUEL) 25 MG tablet [id number]  Take 1 tablet (25 mg total) by mouth at bedtime. Edman [name],  DO  Active   rosuvastatin  (CRESTOR ) 10 MG tablet [id number]  Take 1 tablet (10 mg total) by mouth daily. Gollan, [name], MD  Active Self           Med Note [name]   Dju Sep 09, 2024 10:44 AM)    sodium chloride  (OCEAN) 0.65 % SOLN nasal spray [id number]  Place 1 spray into both nostrils as needed for congestion. [provider]  Active Self  Specialty Vitamins Products (HAIR NOURISHING SUPPLEMENT PO) [id number]  Take 1 tablet by mouth daily. [provider]  Active Self  sulfamethoxazole -trimethoprim  (BACTRIM ) 400-80 MG tablet [id number]  Take 1 tablet by mouth 3 (three) times a week. M W F [provider]  Active             Recommendation:   Continue Current Plan of Care  Follow Up Plan:   Telephone follow-up in 1 week  Medford Balboa, BSN, RN [place]  VBCI - [employer] [facility] RN Care Manager  [phone]     

## 2024-10-16 ENCOUNTER — Telehealth: Payer: Self-pay

## 2024-10-16 ENCOUNTER — Other Ambulatory Visit: Admitting: Pharmacist

## 2024-10-16 DIAGNOSIS — J432 Centrilobular emphysema: Secondary | ICD-10-CM

## 2024-10-16 NOTE — Telephone Encounter (Signed)
 Okay to proceed w/ PT  [name], DO [facility] 10/16/2024, 1:53 PM

## 2024-10-16 NOTE — Progress Notes (Unsigned)
 10/16/2024 Name: Brandi [name] MRN: [id number] DOB: 12/25/1955  Chief Complaint  Patient presents with   Medication Assistance    Brandi [name] is a 68 y.o. year old female who presented for a telephone visit.   They were referred to the pharmacist by their PCP for assistance in managing medication access.      Subjective:   Care Team: Primary Care Provider: Edman [name], DO; Next Scheduled Visit: 10/23/2024 Pulmonologist: Parris Manna, MD; Next Scheduled Visit: 10/25/2024  Medication Access/Adherence  Current Pharmacy:  [facility] [place] - [address] [place] [address] Phone: [phone] Fax: [phone]  [id number] [place], [place] - [address] AT [facility] [address] [place] [address] [place] [address] Phone: [phone] Fax: [phone]  [facility] #[id number] - [address] AT [facility] [address] [facility] [place] [address] Phone: [phone] Fax: [phone]  [facility] - [place], [place] - [address] [place] [address] Phone: [phone] Fax: [phone]  [facility] - [place], [place] - [address] [place] [address] Phone: [phone] Fax: [phone]   Patient reports affordability concerns with their medications: Yes  Patient reports access/transportation concerns to their pharmacy: No  Patient reports adherence concerns with their medications:  No     Confirms received response in mail from Social Security regarding Extra Help application advising that she was approved for Full Extra Help    COPD:   Currently followed by [facility] Pulmonology - Note at latest Office Visit with Dr. Parris on 09/28/2024, provider again advised patient again to follow up with Dupixent MyWay regarding getting started on Dupixent injections  Current medications: - Breztri  inhaler -  2 puffs in morning and at bedtime - albuterol  inhaler - 2 puffs every 4 hours as needed - albuterol  nebulizer solution - 1 vial via nebulizer every 6 hours as needed - prednisone  5 mg daily - On oxygen  - sulfamethoxazole -trimethoprim  400-80 mg tablet - 1 tablet every Monday, Wednesday and Friday    Objective:  Lab Results  Component Value Date   HGBA1C 5.8 (H) 01/20/2021    Lab Results  Component Value Date   CREATININE 0.54 09/15/2024   BUN 23 09/15/2024   NA 141 09/15/2024   K 4.6 09/15/2024   CL 100 09/15/2024   CO2 32 09/15/2024    Lab Results  Component Value Date   CHOL 165 12/22/2021   HDL 63 12/22/2021   LDLCALC 89 12/22/2021   TRIG 64 12/22/2021   CHOLHDL 2.6 12/22/2021    Current Outpatient Medications on File Prior to Visit  Medication Sig Dispense Refill   albuterol  (PROVENTIL ) (2.5 MG/3ML) 0.083% nebulizer solution USE 1 VIAL VIA NEBULIZER EVERY 6 HOURS AS NEEDED FOR WHEEZING OR SHORTNESS OF BREATH 75 mL 6   albuterol  (VENTOLIN  HFA) 108 (90 Base) MCG/ACT inhaler INHALE 2 PUFFS INTO THE LUNGS EVERY 4 HOURS AS NEEDED FOR WHEEZING OR SHORTNESS OF BREATH (Patient not taking: Reported on 09/25/2024) 8.5 g 2   alendronate  (FOSAMAX ) 70 MG tablet Take 1 tablet (70 mg total) by mouth once a week. 12 tablet 3   aspirin  EC 81 MG tablet Take 1 tablet (81 mg total) by mouth daily. 90 tablet 3   benzonatate (TESSALON) 100 MG capsule Take 1 capsule (100  mg total) by mouth 3 (three) times daily as needed for cough. 20 capsule 0   budesonide-glycopyrrolate -formoterol (BREZTRI  AEROSPHERE) 160-9-4.8 MCG/ACT AERO inhaler Inhale 2 puffs into the lungs in the morning and at bedtime. 3 each 3   busPIRone  (BUSPAR ) 7.5 MG tablet Take 1 tablet (7.5 mg total) by mouth 2 (two) times daily as needed. for anxiety 180 tablet 1   Cholecalciferol (VITAMIN D3) 125 MCG (5000 UT) TABS Take by mouth. Vitamin D3 5,000 IU daily for 12 weeks then reduce to OTC Vitamin D3 2,000 IU daily for  maintenance     cyanocobalamin  (VITAMIN B12) 1000 MCG/ML injection Inject 1 mL (1,000 mcg total) into the muscle every 30 (thirty) days. For 4 months 4 mL 0   docusate sodium (COLACE) 100 MG capsule Take 1 capsule (100 mg total) by mouth daily as needed for mild constipation or moderate constipation. 90 capsule 1   famotidine  (PEPCID ) 20 MG tablet TAKE 1 TABLET BY MOUTH TWICE DAILY AS NEEDED 180 tablet 1   feeding supplement (ENSURE ENLIVE / ENSURE PLUS) LIQD Take 237 mLs by mouth 2 (two) times daily between meals. (Patient not taking: Reported on 09/25/2024) 237 mL 12   furosemide (LASIX) 20 MG tablet Take 1 tablet (20 mg total) by mouth daily. 30 tablet 1   ipratropium-albuterol  (DUONEB) 0.5-2.5 (3) MG/3ML SOLN Take 3 mLs by nebulization 3 (three) times daily. 360 mL 0   LORazepam (ATIVAN) 0.5 MG tablet Take 1 tablet (0.5 mg total) by mouth every 8 (eight) hours as needed for anxiety. 20 tablet 2   montelukast  (SINGULAIR ) 10 MG tablet Take 1 tablet (10 mg total) by mouth at bedtime. 90 tablet 1   Multiple Vitamin (MULTIVITAMIN WITH MINERALS) TABS tablet Take 1 tablet by mouth daily.     omeprazole  (PRILOSEC) 20 MG capsule Take 1 capsule (20 mg total) by mouth 2 (two) times daily before a meal. 180 capsule 3   ondansetron  (ZOFRAN ) 4 MG tablet Take 1 tablet (4 mg total) by mouth every 6 (six) hours as needed for nausea or vomiting. 10 tablet 0   OXYGEN  Inhale 3 L into the lungs at bedtime as needed.     predniSONE  (DELTASONE ) 5 MG tablet Take 5 mg by mouth daily with breakfast.     QUEtiapine (SEROQUEL) 25 MG tablet Take 1 tablet (25 mg total) by mouth at bedtime. 90 tablet 1   rosuvastatin  (CRESTOR ) 10 MG tablet Take 1 tablet (10 mg total) by mouth daily. 90 tablet 3   sodium chloride  (OCEAN) 0.65 % SOLN nasal spray Place 1 spray into both nostrils as needed for congestion.     Specialty Vitamins Products (HAIR NOURISHING SUPPLEMENT PO) Take 1 tablet by mouth daily.      sulfamethoxazole -trimethoprim  (BACTRIM ) 400-80 MG tablet Take 1 tablet by mouth 3 (three) times a week. M W F     No current facility-administered medications on file prior to visit.        Assessment/Plan:   Provide patient with counseling on Full Extra Help subsidy impact on premium, deductibles and copayments for her prescription coverage   COPD: - Reviewed appropriate inhaler technique. - Now that patient is approved for Full Extra Help subsidy, patient to follow up again with Dupixent MyWay to discuss starting Dupixent [phone])    Follow Up Plan:   Patient denies further medication questions or concerns today Provide patient with contact information for clinic pharmacist to contact if needed in future for medication questions/concerns  Sharyle Sia, PharmD, [facility] Clinical Pharmacist [facility] [phone]

## 2024-10-16 NOTE — Telephone Encounter (Signed)
 Copied from CRM [id number]. Topic: Clinical - Home Health Verbal Orders >> Oct 16, 2024  1:17 PM Avram MATSU wrote: Caller/Agency: Darryle Rushing Number: [phone] Service Requested: Physical Therapy Frequency: 1*8 Any new concerns about the patient? No

## 2024-10-16 NOTE — Telephone Encounter (Signed)
 Spoke with Wes, orders given

## 2024-10-18 ENCOUNTER — Other Ambulatory Visit: Payer: Self-pay

## 2024-10-18 NOTE — Patient Instructions (Signed)
 Goals Addressed             This Visit's Progress    Pharmacy Goals       Call Dupixent [facility]Way as recommended by Dr. DELENA to get started on Dupixent - [phone]   Our goal bad cholesterol, or LDL, is less than 70. This is why it is important to continue taking your rosuvastatin  and ezetimibe .  Feel free to call me with any medication related questions or concerns.  Sharyle Sia, PharmD, JAQUELINE, CPP Clinical Pharmacist [facility] [phone]

## 2024-10-18 NOTE — Transitions of Care (Post Inpatient/ED Visit) (Signed)
 Transition of Care week 5  Visit Note  10/18/2024  Name: Brandi [name] MRN: [id number]          DOB: 14-Nov-1956  Situation: Patient enrolled in [facility] 30-day program. Visit completed with Allena Simpler by telephone.   Background:   Initial Transition Care Management Follow-up Telephone Call Discharge Date and Diagnosis: 09/16/24, COPD   Past Medical History:  Diagnosis Date   Anxiety    COPD (chronic obstructive pulmonary disease) (HCC)    Hyperlipidemia     Assessment: Patient Reported Symptoms: Cognitive Cognitive Status: Alert and oriented to person, place, and time, Normal speech and language skills      Neurological Neurological Review of Symptoms: Not assessed    HEENT HEENT Symptoms Reported: No symptoms reported      Cardiovascular Cardiovascular Symptoms Reported: No symptoms reported    Respiratory Respiratory Symptoms Reported: No symptoms reported Additional Respiratory Details: PT has been started and she is doing her home program. She has been able to maintain her O2 sats above 95% at times without the oxygen . She is going to explore Dupixent injections now that she has full funding Respiratory Management Strategies: Medication therapy, Routine screening, Adequate rest, Oxygen  therapy  Endocrine Endocrine Symptoms Reported: No symptoms reported    Gastrointestinal Gastrointestinal Symptoms Reported: No symptoms reported      Genitourinary Genitourinary Symptoms Reported: No symptoms reported    Integumentary Integumentary Symptoms Reported: No symptoms reported    Musculoskeletal Musculoskelatal Symptoms Reviewed: Limited mobility Musculoskeletal Management Strategies: Activity, Exercise, Routine screening Musculoskeletal Comment: Home Health Pt and OT      Psychosocial Psychosocial Symptoms Reported: No symptoms reported         There were no vitals filed for this visit.    Medications Reviewed Today     Reviewed by Moises Reusing, RN  (Case Manager) on 10/18/24 at 1110  Med List Status: <None>   Medication Order Taking? Sig Documenting Provider Last Dose Status Informant  albuterol  (PROVENTIL ) (2.5 MG/3ML) 0.083% nebulizer solution [id number]  USE 1 VIAL VIA NEBULIZER EVERY 6 HOURS AS NEEDED FOR WHEEZING OR SHORTNESS OF [name], MD  Active Self  albuterol  (VENTOLIN  HFA) 108 (90 Base) MCG/ACT inhaler [id number]  INHALE 2 PUFFS INTO THE LUNGS EVERY 4 HOURS AS NEEDED FOR WHEEZING OR SHORTNESS OF BREATH  Patient not taking: Reported on 09/25/2024   Edman [name], DO  Active Self  alendronate  (FOSAMAX ) 70 MG tablet [id number]  Take 1 tablet (70 mg total) by mouth once a week. Edman [name], DO  Active Self  aspirin  EC 81 MG tablet [id number]  Take 1 tablet (81 mg total) by mouth daily. Gollan, [name], MD  Active Self  benzonatate (TESSALON) 100 MG capsule [id number]  Take 1 capsule (100 mg total) by mouth 3 (three) times daily as needed for cough. Cox, Amy N, DO  Active   budesonide-glycopyrrolate -formoterol (BREZTRI  AEROSPHERE) 160-9-4.8 MCG/ACT AERO inhaler [id number]  Inhale 2 puffs into the lungs in the morning and at bedtime. Tamea Dedra LITTIE, MD  Active Self  busPIRone  (BUSPAR ) 7.5 MG tablet [id number]  Take 1 tablet (7.5 mg total) by mouth 2 (two) times daily as needed. for anxiety Edman [name], DO  Active Self  Cholecalciferol (VITAMIN D3) 125 MCG (5000 UT) TABS [id number]  Take by mouth. Vitamin D3 5,000 IU daily for 12 weeks then reduce to OTC Vitamin D3 2,000 IU daily for maintenance [provider]  Active Self  cyanocobalamin  (VITAMIN B12) 1000 MCG/ML injection  [id number]  Inject 1 mL (1,000 mcg total) into the muscle every 30 (thirty) days. For 4 months Edman [name], DO  Active   docusate sodium (COLACE) 100 MG capsule [id number]  Take 1 capsule (100 mg total) by mouth daily as needed for mild constipation or moderate constipation. Edman [name],  DO  Active   famotidine  (PEPCID ) 20 MG tablet [id number]  TAKE 1 TABLET BY MOUTH TWICE DAILY AS NEEDED Edman [name], DO  Active Self  feeding supplement (ENSURE ENLIVE / ENSURE PLUS) LIQD [id number]  Take 237 mLs by mouth 2 (two) times daily between meals.  Patient not taking: Reported on 09/25/2024   [name], DO  Active Self  furosemide (LASIX) 20 MG tablet [id number]  Take 1 tablet (20 mg total) by mouth daily. [name], [name], DO  Active   ipratropium-albuterol  (DUONEB) 0.5-2.5 (3) MG/3ML SOLN [id number]  Take 3 mLs by nebulization 3 (three) times daily. Cox, Amy N, DO  Active   LORazepam (ATIVAN) 0.5 MG tablet [id number]  Take 1 tablet (0.5 mg total) by mouth every 8 (eight) hours as needed for anxiety. Edman [name], DO  Active   montelukast  (SINGULAIR ) 10 MG tablet [id number]  Take 1 tablet (10 mg total) by mouth at bedtime. Edman [name], DO  Active   Multiple Vitamin (MULTIVITAMIN WITH MINERALS) TABS tablet [id number]  Take 1 tablet by mouth daily. Fausto Sor LABOR, DO  Active Self           Med Note [name]   Dju Sep 09, 2024 10:43 AM)    omeprazole  (PRILOSEC) 20 MG capsule [id number]  Take 1 capsule (20 mg total) by mouth 2 (two) times daily before a meal. Edman, [name], DO  Active Self  ondansetron  (ZOFRAN ) 4 MG tablet [id number]  Take 1 tablet (4 mg total) by mouth every 6 (six) hours as needed for nausea or vomiting. Cox, Amy LOISE, DO  Active   OXYGEN  [id number]  Inhale 3 L into the lungs at bedtime as needed. [provider]  Active Self           Med Note [name], JACKSON LOISE   Wed Nov 17, 2023  2:51 PM) Reports currently using 4 min/l  predniSONE  (DELTASONE ) 5 MG tablet [id number]  Take 5 mg by mouth daily with breakfast. [provider]  Active   QUEtiapine (SEROQUEL) 25 MG tablet [id number]  Take 1 tablet (25 mg total) by mouth at bedtime. Edman [name], DO  Active   rosuvastatin  (CRESTOR ) 10  MG tablet [id number]  Take 1 tablet (10 mg total) by mouth daily. Gollan, [name], MD  Active Self           Med Note [name]   Dju Sep 09, 2024 10:44 AM)    sodium chloride  (OCEAN) 0.65 % SOLN nasal spray [id number]  Place 1 spray into both nostrils as needed for congestion. [provider]  Active Self  Specialty Vitamins Products (HAIR NOURISHING SUPPLEMENT PO) [id number]  Take 1 tablet by mouth daily. [provider]  Active Self  sulfamethoxazole -trimethoprim  (BACTRIM ) 400-80 MG tablet [id number]  Take 1 tablet by mouth 3 (three) times a week. M W F [provider]  Active             Recommendation:   Discharge from [facility] 30 Day program.   Follow Up Plan:   Closing From:  Transitions of Care Program  [facility], BSN, RN [employer]  DESIREE JASMINE Population Health RN Care Manager [phone]

## 2024-10-18 NOTE — Patient Instructions (Signed)
 Visit Information  Thank you for taking time to visit with me today. Please don't hesitate to contact me if I can be of assistance to you before our next scheduled telephone appointment.   Following is a copy of your care plan:   Goals Addressed             This Visit's Progress    COMPLETED: [facility] Transitions of Care (TOC) Care Plan       Problems: (reviewed 10/18/24)  Recent Hospitalization for treatment of COPD Hospital  or ED Adm 89%  Goal: (reviewed 10/18/24) Over the next 30 days, the patient will not experience hospital readmission  Interventions: (reviewed 10/18/24)  COPD Interventions:  (reviewed 10/18/24) Advised patient to track and manage COPD triggers Assessed social determinant of health barriers Discussed the importance of adequate rest and management of fatigue with COPD Provided instruction about proper use of medications used for management of COPD including inhalers Screening for signs and symptoms of depression related to chronic disease state  Use of home oxygen  3-4l/min PRN at bedtime Home Health PT/OT - Started 10/16/24 Prophylaxis antibiotic Bactrim  400-80mg  Monday, Wednesday, Friday Follow up with [facility] Pulmonologist Dr. Juanita - Completed 10/04/24. Next appointment is November 19th  Patient Self Care Activities: (reviewed 10/11/24) Attend all scheduled provider appointments Call pharmacy for medication refills 3-7 days in advance of running out of medications Call provider office for new concerns or questions  Notify RN Care Manager of [facility] call rescheduling needs Participate in Transition of Care Program/Attend [facility] scheduled calls Perform all self care activities independently  Take medications as prescribed   Lasix Daily for edema to BLE Elevated extremities when sitting Quetiapin at night as needed for insomnia  Plan:  Telephone follow up appointment with care management team member scheduled for:  Wednesday November 12th at 11:00amam No further  follow up is needed. The patient has met her goals        Patient verbalizes understanding of instructions and care plan provided today and agrees to view in MyChart. Active MyChart status and patient understanding of how to access instructions and care plan via MyChart confirmed with patient.     The patient has been provided with contact information for the care management team and has been advised to call with any health related questions or concerns.   Please call the care guide team at [phone] if you need to cancel or reschedule your appointment.   Please call the Suicide and Crisis Lifeline: 988 call the [place]  National Suicide Prevention Lifeline: 579-684-1048 or TTY: (702)211-8931 TTY (830)005-3014) to talk to a trained counselor if you are experiencing a Mental Health or Behavioral Health Crisis or need someone to talk to.  Medford Balboa, BSN, RN   [facility] - [employer] [facility] RN Care Manager [phone]

## 2024-10-20 NOTE — Telephone Encounter (Unsigned)
 Copied from CRM #[id number]. Topic: General - Call Back - No Documentation >> Oct 20, 2024 11:06 AM Avram MATSU wrote: Reason for CRM: patient stated she is getting testing done on the 18th and wanted to know if she needed to after the testing? She has her appt on 11/17 with her provider. Please advise [phone]

## 2024-10-23 ENCOUNTER — Ambulatory Visit: Admitting: Family Medicine

## 2024-10-23 NOTE — Telephone Encounter (Signed)
 I think this question / CRM is missing information. I don't quite understand what is being asked, so I cannot advise.  It looks like she has apt with Pulmonology on 10/25/24  I don't need to see her back immediately after lung testing, she should follow her Pulmonologist after the testing.  [name], DO [facility] 10/23/2024, 12:50 PM

## 2024-10-23 NOTE — Telephone Encounter (Signed)
 Spoke to patient, she cancelled her appointment for today feeling she would need pulmonary tests completed before coming back to see dr Edman.

## 2024-10-26 ENCOUNTER — Ambulatory Visit: Payer: Self-pay

## 2024-10-26 ENCOUNTER — Ambulatory Visit

## 2024-10-26 ENCOUNTER — Emergency Department

## 2024-10-26 ENCOUNTER — Observation Stay: Admission: EM | Admit: 2024-10-26 | Discharge: 2024-10-28 | Disposition: A | Discharge status: Home / Self Care

## 2024-10-26 ENCOUNTER — Other Ambulatory Visit: Payer: Self-pay

## 2024-10-26 DIAGNOSIS — K219 Gastro-esophageal reflux disease without esophagitis: Secondary | ICD-10-CM | POA: Diagnosis not present

## 2024-10-26 DIAGNOSIS — R7303 Prediabetes: Secondary | ICD-10-CM | POA: Diagnosis not present

## 2024-10-26 DIAGNOSIS — E785 Hyperlipidemia, unspecified: Secondary | ICD-10-CM | POA: Diagnosis not present

## 2024-10-26 DIAGNOSIS — F411 Generalized anxiety disorder: Secondary | ICD-10-CM | POA: Insufficient documentation

## 2024-10-26 DIAGNOSIS — G4700 Insomnia, unspecified: Secondary | ICD-10-CM | POA: Diagnosis not present

## 2024-10-26 DIAGNOSIS — Z79899 Other long term (current) drug therapy: Secondary | ICD-10-CM | POA: Diagnosis not present

## 2024-10-26 DIAGNOSIS — Z6821 Body mass index (BMI) 21.0-21.9, adult: Secondary | ICD-10-CM | POA: Diagnosis not present

## 2024-10-26 DIAGNOSIS — F39 Unspecified mood [affective] disorder: Secondary | ICD-10-CM | POA: Insufficient documentation

## 2024-10-26 DIAGNOSIS — J441 Chronic obstructive pulmonary disease with (acute) exacerbation: Secondary | ICD-10-CM | POA: Diagnosis not present

## 2024-10-26 DIAGNOSIS — J432 Centrilobular emphysema: Secondary | ICD-10-CM | POA: Diagnosis not present

## 2024-10-26 DIAGNOSIS — Z87891 Personal history of nicotine dependence: Secondary | ICD-10-CM | POA: Diagnosis not present

## 2024-10-26 DIAGNOSIS — F410 Panic disorder [episodic paroxysmal anxiety] without agoraphobia: Secondary | ICD-10-CM | POA: Diagnosis present

## 2024-10-26 DIAGNOSIS — R0902 Hypoxemia: Secondary | ICD-10-CM

## 2024-10-26 DIAGNOSIS — R0600 Dyspnea, unspecified: Secondary | ICD-10-CM | POA: Diagnosis present

## 2024-10-26 DIAGNOSIS — R0602 Shortness of breath: Secondary | ICD-10-CM | POA: Diagnosis present

## 2024-10-26 DIAGNOSIS — E46 Unspecified protein-calorie malnutrition: Secondary | ICD-10-CM | POA: Insufficient documentation

## 2024-10-26 DIAGNOSIS — R531 Weakness: Secondary | ICD-10-CM

## 2024-10-26 DIAGNOSIS — J45909 Unspecified asthma, uncomplicated: Secondary | ICD-10-CM | POA: Insufficient documentation

## 2024-10-26 LAB — CBC WITH DIFFERENTIAL/PLATELET
Abs Immature Granulocytes: 0.08 K/uL — ABNORMAL HIGH (ref 0.00–0.07)
Basophils Absolute: 0.1 K/uL (ref 0.0–0.1)
Basophils Relative: 1 %
Eosinophils Absolute: 0.7 K/uL — ABNORMAL HIGH (ref 0.0–0.5)
Eosinophils Relative: 7 %
HCT: 40.1 % (ref 36.0–46.0)
Hemoglobin: 13.1 g/dL (ref 12.0–15.0)
Immature Granulocytes: 1 %
Lymphocytes Relative: 33 %
Lymphs Abs: 3.2 K/uL (ref 0.7–4.0)
MCH: 31.9 pg (ref 26.0–34.0)
MCHC: 32.7 g/dL (ref 30.0–36.0)
MCV: 97.6 fL (ref 80.0–100.0)
Monocytes Absolute: 0.8 K/uL (ref 0.1–1.0)
Monocytes Relative: 8 %
Neutro Abs: 5 K/uL (ref 1.7–7.7)
Neutrophils Relative %: 50 %
Platelets: 347 K/uL (ref 150–400)
RBC: 4.11 MIL/uL (ref 3.87–5.11)
RDW: 12.1 % (ref 11.5–15.5)
WBC: 9.9 K/uL (ref 4.0–10.5)
nRBC: 0 % (ref 0.0–0.2)

## 2024-10-26 LAB — COMPREHENSIVE METABOLIC PANEL WITH GFR
ALT: 12 U/L (ref 0–44)
AST: 21 U/L (ref 15–41)
Albumin: 4.3 g/dL (ref 3.5–5.0)
Alkaline Phosphatase: 48 U/L (ref 38–126)
Anion gap: 8 (ref 5–15)
BUN: 18 mg/dL (ref 8–23)
CO2: 32 mmol/L (ref 22–32)
Calcium: 9.2 mg/dL (ref 8.9–10.3)
Chloride: 101 mmol/L (ref 98–111)
Creatinine, Ser: 0.71 mg/dL (ref 0.44–1.00)
GFR, Estimated: >60 mL/min (ref >60)
Glucose, Bld: 113 mg/dL — ABNORMAL HIGH (ref 70–99)
Potassium: 3.6 mmol/L (ref 3.5–5.1)
Sodium: 141 mmol/L (ref 135–145)
Total Bilirubin: <0.2 mg/dL (ref 0.0–1.2)
Total Protein: 6.7 g/dL (ref 6.5–8.1)

## 2024-10-26 LAB — PRO BRAIN NATRIURETIC PEPTIDE: Pro Brain Natriuretic Peptide: <50 pg/mL (ref <300.0)

## 2024-10-26 LAB — BLOOD GAS, VENOUS
Acid-Base Excess: 10.2 mmol/L — ABNORMAL HIGH (ref 0.0–2.0)
Bicarbonate: 38.2 mmol/L — ABNORMAL HIGH (ref 20.0–28.0)
O2 Saturation: 26.9 %
Patient temperature: 37
pCO2, Ven: 66 mmHg — ABNORMAL HIGH (ref 44–60)
pH, Ven: 7.37 (ref 7.25–7.43)

## 2024-10-26 LAB — TROPONIN T, HIGH SENSITIVITY
Troponin T High Sensitivity: <15 ng/L (ref 0–19)
Troponin T High Sensitivity: <15 ng/L (ref 0–19)

## 2024-10-26 LAB — RESP PANEL BY RT-PCR (RSV, FLU A&B, COVID)  RVPGX2
Influenza A by PCR: NEGATIVE
Influenza B by PCR: NEGATIVE
Resp Syncytial Virus by PCR: NEGATIVE
SARS Coronavirus 2 by RT PCR: NEGATIVE

## 2024-10-26 LAB — MAGNESIUM: Magnesium: 2.2 mg/dL (ref 1.7–2.4)

## 2024-10-26 MED ORDER — ENOXAPARIN SODIUM 40 MG/0.4ML IJ SOSY
40.0000 mg | PREFILLED_SYRINGE | INTRAMUSCULAR | Status: DC
  Administered 2024-10-26: 40 mg via SUBCUTANEOUS
  Filled 2024-10-26 (×2): qty 0.4

## 2024-10-26 MED ORDER — QUETIAPINE FUMARATE 25 MG PO TABS
25.0000 mg | ORAL_TABLET | Freq: Every day | ORAL | Status: DC
  Administered 2024-10-26 – 2024-10-27 (×2): 25 mg via ORAL
  Filled 2024-10-26 (×2): qty 1

## 2024-10-26 MED ORDER — AZITHROMYCIN 250 MG PO TABS
250.0000 mg | ORAL_TABLET | ORAL | Status: DC
  Administered 2024-10-27: 250 mg via ORAL
  Filled 2024-10-26: qty 1

## 2024-10-26 MED ORDER — BUDESON-GLYCOPYRROL-FORMOTEROL 160-9-4.8 MCG/ACT IN AERO
2.0000 | INHALATION_SPRAY | Freq: Two times a day (BID) | RESPIRATORY_TRACT | Status: DC
  Administered 2024-10-26 – 2024-10-27 (×2): 2 via RESPIRATORY_TRACT
  Filled 2024-10-26: qty 5.9

## 2024-10-26 MED ORDER — ROSUVASTATIN CALCIUM 10 MG PO TABS
10.0000 mg | ORAL_TABLET | Freq: Every day | ORAL | Status: DC
  Administered 2024-10-27 – 2024-10-28 (×2): 10 mg via ORAL
  Filled 2024-10-26 (×2): qty 1

## 2024-10-26 MED ORDER — ACETAMINOPHEN 325 MG PO TABS: 650.0000 mg | ORAL_TABLET | Freq: Four times a day (QID) | ORAL | Status: DC | PRN

## 2024-10-26 MED ORDER — ACETAMINOPHEN 650 MG RE SUPP
650.0000 mg | Freq: Four times a day (QID) | RECTAL | Status: DC | PRN
Start: 2024-10-26 — End: 2024-10-28

## 2024-10-26 MED ORDER — SODIUM CHLORIDE 0.9% FLUSH
3.0000 mL | Freq: Two times a day (BID) | INTRAVENOUS | Status: DC
  Administered 2024-10-26 – 2024-10-28 (×4): 3 mL via INTRAVENOUS

## 2024-10-26 MED ORDER — ENSURE ENLIVE PO LIQD
237.0000 mL | Freq: Two times a day (BID) | ORAL | Status: DC
  Administered 2024-10-27: 237 mL via ORAL
  Filled 2024-10-26: qty 237

## 2024-10-26 MED ORDER — ADULT MULTIVITAMIN W/MINERALS CH
1.0000 | ORAL_TABLET | Freq: Every day | ORAL | Status: DC
  Administered 2024-10-27 – 2024-10-28 (×2): 1 via ORAL
  Filled 2024-10-26 (×2): qty 1

## 2024-10-26 MED ORDER — BUSPIRONE HCL 15 MG PO TABS
7.5000 mg | ORAL_TABLET | Freq: Two times a day (BID) | ORAL | Status: DC
  Administered 2024-10-26 – 2024-10-28 (×4): 7.5 mg via ORAL
  Filled 2024-10-26 (×4): qty 1

## 2024-10-26 MED ORDER — LORAZEPAM 0.5 MG PO TABS
0.5000 mg | ORAL_TABLET | Freq: Three times a day (TID) | ORAL | Status: DC | PRN
Start: 2024-10-26 — End: 2024-10-28

## 2024-10-26 MED ORDER — METHYLPREDNISOLONE SODIUM SUCC 125 MG IJ SOLR
125.0000 mg | Freq: Once | INTRAMUSCULAR | Status: AC
  Administered 2024-10-26: 125 mg via INTRAVENOUS
  Filled 2024-10-26: qty 2

## 2024-10-26 MED ORDER — SENNOSIDES-DOCUSATE SODIUM 8.6-50 MG PO TABS: 1.0000 | ORAL_TABLET | Freq: Every evening | ORAL | Status: DC | PRN

## 2024-10-26 MED ORDER — IPRATROPIUM-ALBUTEROL 0.5-2.5 (3) MG/3ML IN SOLN
3.0000 mL | Freq: Once | RESPIRATORY_TRACT | Status: AC
  Administered 2024-10-26: 3 mL via RESPIRATORY_TRACT
  Filled 2024-10-26: qty 3

## 2024-10-26 MED ORDER — PANTOPRAZOLE SODIUM 40 MG PO TBEC
40.0000 mg | DELAYED_RELEASE_TABLET | Freq: Every day | ORAL | Status: DC
  Administered 2024-10-27 – 2024-10-28 (×2): 40 mg via ORAL
  Filled 2024-10-26 (×2): qty 1

## 2024-10-26 MED ORDER — MONTELUKAST SODIUM 10 MG PO TABS
10.0000 mg | ORAL_TABLET | Freq: Every day | ORAL | Status: DC
  Administered 2024-10-26 – 2024-10-27 (×2): 10 mg via ORAL
  Filled 2024-10-26 (×2): qty 1

## 2024-10-26 MED ORDER — BENZONATATE 100 MG PO CAPS: 100.0000 mg | ORAL_CAPSULE | Freq: Three times a day (TID) | ORAL | Status: DC | PRN

## 2024-10-26 MED ORDER — FAMOTIDINE 20 MG PO TABS
20.0000 mg | ORAL_TABLET | Freq: Two times a day (BID) | ORAL | Status: DC | PRN
Start: 2024-10-26 — End: 2024-10-28

## 2024-10-26 MED ORDER — ASPIRIN 81 MG PO TBEC
81.0000 mg | DELAYED_RELEASE_TABLET | Freq: Every day | ORAL | Status: DC
  Administered 2024-10-27 – 2024-10-28 (×2): 81 mg via ORAL
  Filled 2024-10-26 (×2): qty 1

## 2024-10-26 MED ORDER — PREDNISONE 20 MG PO TABS
40.0000 mg | ORAL_TABLET | Freq: Every day | ORAL | Status: DC
  Administered 2024-10-27 – 2024-10-28 (×2): 40 mg via ORAL
  Filled 2024-10-26 (×2): qty 2

## 2024-10-26 MED ORDER — BUDESONIDE 0.5 MG/2ML IN SUSP
0.5000 mg | Freq: Two times a day (BID) | RESPIRATORY_TRACT | Status: DC
  Administered 2024-10-26: 0.5 mg via RESPIRATORY_TRACT
  Filled 2024-10-26: qty 2

## 2024-10-26 MED ORDER — FUROSEMIDE 20 MG PO TABS
20.0000 mg | ORAL_TABLET | Freq: Every day | ORAL | Status: DC
  Administered 2024-10-27 – 2024-10-28 (×2): 20 mg via ORAL
  Filled 2024-10-26 (×2): qty 1

## 2024-10-26 NOTE — ED Notes (Signed)
 NURSE VET RN INFORMED OF BED ASSIGNED

## 2024-10-26 NOTE — Telephone Encounter (Signed)
 FYI Only or Action Required?: FYI only for provider: EMS Called.  Patient was last seen in primary care on 09/25/2024 by Edman [name], DO.  Called Nurse Triage reporting No chief complaint on file..  Symptoms began yesterday.  Interventions attempted: Prescription medications: all breathing treatments and Rest, hydration, or home remedies.  Symptoms are: rapidly worsening.  Triage Disposition: Call EMS 911 Now  Patient/caregiver understands and will follow disposition?: Yes  Copied from CRM #[id number]. Topic: Clinical - Home Health Verbal Orders >> Oct 26, 2024 12:30 PM Delon DASEN wrote: Caller/Agency: [facility] Callback Number: [phone] Service Requested: Skilled Nursing Frequency: n/a Any new concerns about the patient? Yes- pulse high 105-115- oxygen  low 93% until she moves then it drops, breathing is labored Reason for Disposition  SEVERE difficulty breathing (e.g., struggling for each breath, speaks in single words)  Answer Assessment - Initial Assessment Questions Innocent from [facility] calling in with patient to report severe air hungering. She is sustaining her oxygen  at rest at 93% but as soon as she stands to do anything she drops to 88%.  Patient on 4L O2 at baseline. Respiratory rate is elevated, HR is elevated up to 115. Using accessory muscles to gasp for air.  States she has used all of her breathing treatments and medications and has very little improvement. Innocent spoke with her yesterday as well and she was much better at that time. Alert, denies confusion, CP, Dizziness Advised Innocent to call for EMS to evaluate patient. She agrees and will get off the phone to make the call 1. RESPIRATORY STATUS: Describe your breathing? (e.g., wheezing, shortness of breath, unable to speak, severe coughing)      SOB, gasping for air 2. ONSET: When did this breathing problem begin?      COPD, worse today 3. PATTERN Does the difficult  breathing come and go, or has it been constant since it started?      constant 4. SEVERITY: How bad is your breathing? (e.g., mild, moderate, severe)      severe 5. RECURRENT SYMPTOM: Have you had difficulty breathing before? If Yes, ask: When was the last time? and What happened that time?      COPD 6. CARDIAC HISTORY: Do you have any history of heart disease? (e.g., heart attack, angina, bypass surgery, angioplasty)      CAD 7. LUNG HISTORY: Do you have any history of lung disease?  (e.g., pulmonary embolus, asthma, emphysema)     COPD on home O2 8. CAUSE: What do you think is causing the breathing problem?      Copd flare 9. OTHER SYMPTOMS: Do you have any other symptoms? (e.g., chest pain, cough, dizziness, fever, runny nose)     Elevated HR 10. O2 SATURATION MONITOR:  Do you use an oxygen  saturation monitor (pulse oximeter) at home? If Yes, ask: What is your reading (oxygen  level) today? What is your usual oxygen  saturation reading? (e.g., 95%)       93% at rest, drops to 88% with any activity  Protocols used: Breathing Difficulty-A-AH

## 2024-10-26 NOTE — ED Provider Notes (Signed)
 [facility] Provider Note    Event Date/Time   First MD Initiated Contact with Patient 10/26/24 1327     (approximate)   History   Shortness of Breath   HPI  Brandi [name] is a 68 y.o. 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  Patient states that she previously only needed to use her oxygen  during the day as needed and only at night however she is required to use it around-the-clock.  She is given herself a copious amount of at home breathing treatments without improvement.  Patient denies any falls.  She did receive an albuterol  nebulizer and a DuoNeb and route.  She did drop her oxygen  sat on 4 L per EMS with transfer.     Physical Exam   Triage Vital Signs: ED Triage Vitals  Encounter Vitals Group     BP      Girls Systolic BP Percentile      Girls Diastolic BP Percentile      Boys Systolic BP Percentile      Boys Diastolic BP Percentile      Pulse      Resp      Temp      Temp src      SpO2      Weight      Height      Head Circumference      Peak Flow      Pain Score      Pain Loc      Pain Education      Exclude from Growth Chart     Most recent vital signs: Vitals:   10/26/24 1615 10/26/24 1630  BP:  101/73  Pulse: 99 100  Resp: 19 14  Temp:    SpO2:      Nursing Triage Note reviewed. Vital signs reviewed and patients oxygen  saturation is hypoxic.   General: Patient is thin, well developed, awake and alert, appears unwell  Head: Normocephalic and atraumatic Eyes: Normal inspection, extraocular muscles intact, no conjunctival pallor Ear, nose, throat: Normal external exam Neck: Normal range of motion Respiratory: Patient is in mild respiratory distress, lungs with wheezes, not moving  good air Cardiovascular: Patient is tachycardic, RR without murmur appreciated GI: Abd SNT with no guarding or rebound  Back: Normal inspection of the back with good strength and range of motion throughout all ext Extremities: pulses intact with good cap refills, no LE pitting edema or calf tenderness Neuro: The patient is alert and oriented to person, place, and time, appropriately conversive, with 5/5 bilat UE/LE strength, no gross motor or sensory defects noted. Coordination appears to be adequate. Skin: Warm, dry, and intact Psych: normal mood and affect, no SI or HI  ED Results / Procedures / Treatments   Labs (all labs ordered are listed, but only abnormal results are displayed) Labs Reviewed  CBC WITH DIFFERENTIAL/PLATELET - Abnormal; Notable for the following components:      Result Value   Eosinophils Absolute 0.7 (*)    Abs Immature Granulocytes 0.08 (*)    All other components within normal limits  COMPREHENSIVE METABOLIC PANEL WITH GFR - Abnormal; Notable for the following components:   Glucose, Bld 113 (*)    All other components within normal limits  BLOOD GAS, VENOUS -  Abnormal; Notable for the following components:   pCO2, Ven 66 (*)    Bicarbonate 38.2 (*)    Acid-Base Excess 10.2 (*)    All other components within normal limits  RESP PANEL BY RT-PCR (RSV, FLU A&B, COVID)  RVPGX2  PRO BRAIN NATRIURETIC PEPTIDE  MAGNESIUM  TROPONIN T, HIGH SENSITIVITY  TROPONIN T, HIGH SENSITIVITY     EKG EKG and rhythm strip are interpreted by myself:   EKG: tachycardic sinus rhythm at heart rate of 98, normal QRS duration, QTc 443, nonspecific ST segments and T waves no ectopy EKG not consistent with Acute STEMI Rhythm strip: tachycardic sinus rhythm in lead II   RADIOLOGY Xray chest: No acute abnormality on my independent review interpretation radiologist agrees    PROCEDURES:  Critical Care performed: No  Procedures   MEDICATIONS ORDERED IN ED: Medications   sodium chloride  flush (NS) 0.9 % injection 3 mL (has no administration in time range)  acetaminophen  (TYLENOL ) tablet 650 mg (has no administration in time range)    Or  acetaminophen  (TYLENOL ) suppository 650 mg (has no administration in time range)  senna-docusate (Senokot-S) tablet 1 tablet (has no administration in time range)  enoxaparin  (LOVENOX ) injection 40 mg (has no administration in time range)  budesonide (PULMICORT) nebulizer solution 0.5 mg (has no administration in time range)  methylPREDNISolone  sodium succinate (SOLU-MEDROL ) 125 mg/2 mL injection 125 mg (125 mg Intravenous Given 10/26/24 1340)  ipratropium-albuterol  (DUONEB) 0.5-2.5 (3) MG/3ML nebulizer solution 3 mL (3 mLs Nebulization Given 10/26/24 1534)     IMPRESSION / MDM / ASSESSMENT AND PLAN / ED COURSE                                Differential diagnosis includes, but is not limited to, COPD exacerbation, upper respiratory infection, hypercarbia, anemia, electrolyte derangement  ED course: Patient presents acutely on 4 L of oxygen  tachycardic with increased respiratory rate.  Pulmonary exam is consistent with bronchospasm.  Since she had just received a DuoNeb I followed this with 125 mg of Solu-Medrol .  Patient had no respiratory acidosis although CO2 was elevated, suspect this is chronically so.  She had no leukocytosis no profound electrolyte derangements troponin was not elevated and able was a BNP.  I attempted ambulation the patient felt weak and did not feel comfortable downtrending her 4 L of oxygen  that she is currently on.  Attempted to reach out to pulmonology without response but will call the patient and to the hospitalist for admission   Clinical Course as of 10/26/24 1655  Thu Oct 26, 2024  1453 I will reach out to on-call pulmonary regarding this patient [HD]  1515 Resp panel by RT-PCR (RSV, Flu A&B, Covid) Anterior Nasal Swab Negative [HD]  1515 Blood gas, venous(!) No acidosis [HD]  1521  Attempted to reach out to on-call pulmonologist without response yet.  However patient feels too weak to return home and will discuss admission with hospitalist especially since she is on more oxygen  than usual [HD]  1551 Case discussed with hospitalist for admission [HD]    Clinical Course User Index [HD] Nicholaus Rolland BRAVO, MD   -- Risk: 5 This patient has a high risk of morbidity due to further diagnostic testing or treatment. Rationale: This patient's evaluation and management involve a high risk of morbidity due to the potential severity of presenting symptoms, need for diagnostic testing, and/or initiation of treatment that may require close monitoring.  The differential includes conditions with potential for significant deterioration or requiring escalation of care. Treatment decisions in the ED, including medication administration, procedural interventions, or disposition planning, reflect this level of risk. COPA: 5 The patient has the following acute or chronic illness/injury that poses a possible threat to life or bodily function: [X] : The patient has a potentially serious acute condition or an acute exacerbation of a chronic illness requiring urgent evaluation and management in the Emergency Department. The clinical presentation necessitates immediate consideration of life-threatening or function-threatening diagnoses, even if they are ultimately ruled out.   FINAL CLINICAL IMPRESSION(S) / ED DIAGNOSES   Final diagnoses:  COPD exacerbation (HCC)  Hypoxia  General weakness     Rx / DC Orders   ED Discharge Orders     None        Note:  This document was prepared using Dragon voice recognition software and may include unintentional dictation errors.   Nicholaus Rolland BRAVO, MD 10/26/24 [id number]

## 2024-10-26 NOTE — H&P (Signed)
 History and Physical    Brandi [name] FMW:[id number] DOB: [birth year] DOA: 10/26/2024  DOS: the patient was seen and examined on 10/26/2024  PCP: Edman [name], DO   Patient coming from: Home  I have personally briefly reviewed patient's old medical records in [facility] and CareEverywhere  HPI:   Brandi [name] is a 68 y.o. year old female with medical history of COPD, asthma, hypertension, hyperlipidemia presenting to the ED with exertional dyspnea.   Pt states she uses oxygen  nightly and does not use it during the day.  But overall the last week she has been having to use it during the day and has been becoming concerned as she lives alone.  She has been having some coughing but no sputum production.  She denies any fevers or chills.  She denies any myalgias or any other signs of infection.  When asked if she has any sick contacts, she states she lives alone and does not go out much and even avoids going to church since her last hospitalization to prevent sick contacts.   On arrival to the ED patient was noted to be HDS stable.  Lab work and imaging obtained.  CBC without leukocytosis.  Normal hemoglobin.  Elevated absolute eosinophil count, BMP with mild hyperglycemia otherwise unremarkable, proBNP negative, troponin negative, VBG with 7.3 7/66/38 on arrival.  Respiratory panel negative for flu, COVID, RSV.  Chest x-ray without any acute findings.  Given her complaint of worsening dyspnea on exertion, and new oxygen  requirement, [facility] contacted for admission.  Review of Systems: As mentioned in the history of present illness. All other systems reviewed and are negative.   Past Medical History:  Diagnosis Date   Anxiety    COPD (chronic obstructive pulmonary disease) (HCC)    Hyperlipidemia     Past Surgical History:  Procedure Laterality Date   APPENDECTOMY     CHOLECYSTECTOMY     SHOULDER ARTHROSCOPY Left 07/02/2015   Procedure: ,left shoulder arthroscopy,  decompression and debridement;  Surgeon: Norleen PARAS Maltos, MD;  Location: [facility] ORS;  Service: Orthopedics;  Laterality: Left;   SHOULDER CLOSED REDUCTION Left 04/09/2015   Procedure: CLOSED MANIPULATION SHOULDER;  Surgeon: Ozell Flake, MD;  Location: [facility] ORS;  Service: Orthopedics;  Laterality: Left;   TUBAL LIGATION       No Known Allergies  Family History  Problem Relation Age of Onset   Breast cancer Mother 41   Alzheimer's disease Mother     Prior to Admission medications   Medication Sig Start Date End Date Taking? Authorizing Provider  albuterol  (PROVENTIL ) (2.5 MG/3ML) 0.083% nebulizer solution USE 1 VIAL VIA NEBULIZER EVERY 6 HOURS AS NEEDED FOR WHEEZING OR SHORTNESS OF BREATH 09/05/24  Yes Tamea Dedra CROME, MD  alendronate  (FOSAMAX ) 70 MG tablet Take 1 tablet (70 mg total) by mouth once a week. 01/14/24  Yes [name], [name], DO  aspirin  EC 81 MG tablet Take 1 tablet (81 mg total) by mouth daily. 05/22/19  Yes Gollan, [name], MD  benzonatate (TESSALON) 100 MG capsule Take 1 capsule (100 mg total) by mouth 3 (three) times daily as needed for cough. 09/15/24  Yes Cox, Amy N, DO  budesonide-glycopyrrolate -formoterol (BREZTRI  AEROSPHERE) 160-9-4.8 MCG/ACT AERO inhaler Inhale 2 puffs into the lungs in the morning and at bedtime. 08/02/24  Yes Tamea Dedra CROME, MD  busPIRone  (BUSPAR ) 7.5 MG tablet Take 1 tablet (7.5 mg total) by mouth 2 (two) times daily as needed. for anxiety 09/01/24  Yes [name], [name],  DO  Cholecalciferol (VITAMIN D3) 125 MCG (5000 UT) TABS Take by mouth. Vitamin D3 5,000 IU daily for 12 weeks then reduce to OTC Vitamin D3 2,000 IU daily for maintenance   Yes [provider]  cyanocobalamin  (VITAMIN B12) 1000 MCG/ML injection Inject 1 mL (1,000 mcg total) into the muscle every 30 (thirty) days. For 4 months 09/25/24  Yes [name], [name], DO  docusate sodium (COLACE) 100 MG capsule Take 1 capsule (100 mg total) by mouth daily as  needed for mild constipation or moderate constipation. 09/25/24  Yes Edman [name], DO  DUPIXENT 300 MG/2ML SOAJ  10/23/24  Yes [provider]  famotidine  (PEPCID ) 20 MG tablet TAKE 1 TABLET BY MOUTH TWICE DAILY AS NEEDED 08/03/24  Yes [name], [name], DO  furosemide (LASIX) 20 MG tablet Take 1 tablet (20 mg total) by mouth daily. 09/25/24  Yes [name], [name], DO  ipratropium-albuterol  (DUONEB) 0.5-2.5 (3) MG/3ML SOLN Take 3 mLs by nebulization 3 (three) times daily. 09/15/24  Yes Cox, Amy N, DO  LORazepam (ATIVAN) 0.5 MG tablet Take 1 tablet (0.5 mg total) by mouth every 8 (eight) hours as needed for anxiety. 09/25/24  Yes [name], [name], DO  montelukast  (SINGULAIR ) 10 MG tablet Take 1 tablet (10 mg total) by mouth at bedtime. 09/29/24  Yes [name], [name], DO  Multiple Vitamin (MULTIVITAMIN WITH MINERALS) TABS tablet Take 1 tablet by mouth daily. 12/24/20  Yes Fausto Burnard LABOR, DO  omeprazole  (PRILOSEC) 20 MG capsule Take 1 capsule (20 mg total) by mouth 2 (two) times daily before a meal. 04/24/24  Yes [name], [name], DO  predniSONE  (DELTASONE ) 5 MG tablet Take 5 mg by mouth daily with breakfast. 10/02/24 03/31/25 Yes [provider]  QUEtiapine (SEROQUEL) 25 MG tablet Take 1 tablet (25 mg total) by mouth at bedtime. 09/25/24  Yes [name], [name], DO  rosuvastatin  (CRESTOR ) 10 MG tablet Take 1 tablet (10 mg total) by mouth daily. 09/14/23  Yes Gollan, [name], MD  sodium chloride  (OCEAN) 0.65 % SOLN nasal spray Place 1 spray into both nostrils as needed for congestion.   Yes [provider]  Specialty Vitamins Products (HAIR NOURISHING SUPPLEMENT PO) Take 1 tablet by mouth daily.   Yes [provider]  sulfamethoxazole -trimethoprim  (BACTRIM ) 400-80 MG tablet Take 1 tablet by mouth 3 (three) times a week. CHRISTELLA LELON FALCON 10/02/24 04/02/25 Yes [provider]  albuterol  (VENTOLIN  HFA) 108 (90 Base)  MCG/ACT inhaler INHALE 2 PUFFS INTO THE LUNGS EVERY 4 HOURS AS NEEDED FOR WHEEZING OR SHORTNESS OF BREATH Patient not taking: Reported on 09/25/2024 08/03/24   Edman [name], DO  feeding supplement (ENSURE ENLIVE / ENSURE PLUS) LIQD Take 237 mLs by mouth 2 (two) times daily between meals. Patient not taking: Reported on 09/25/2024 12/23/20   Fausto Burnard A, DO  ondansetron  (ZOFRAN ) 4 MG tablet Take 1 tablet (4 mg total) by mouth every 6 (six) hours as needed for nausea or vomiting. Patient not taking: Reported on 10/26/2024 09/15/24   Cox, Amy N, DO  OXYGEN  Inhale 3 L into the lungs at bedtime as needed.    [provider]    Social History:  reports that she quit smoking about 8 years ago. Her smoking use included cigarettes. She started smoking about 49 years ago. She has a 30.8 pack-year smoking history. She has never used smokeless tobacco. She reports that she does not drink alcohol and does not use drugs. Lives by herself Tobacco-previous  use but denies current use EtOH- Denies use.  Illicit drug use- denies use.  IADLs/ADLs- can perform independently at baseline    Physical Exam: Vitals:   10/26/24 1615 10/26/24 1630 10/26/24 1727 10/26/24 2033  BP:  101/73 139/87   Pulse: 99 100 (!) 110   Resp: 19 14 16    Temp:   98 F (36.7 C)   TempSrc:   Oral   SpO2:   98% 94%  Weight:      Height:        Physical Exam General: NAD HENT: NCAT Lungs: Lungs are clear but diminished lung sounds at the mid to base of the lung bilaterally.  No wheeze, rhonchi or rales present Cardiovascular: Normal heart sounds, no r/m/g, 2+ pulses in all extremities. No LE edema Abdomen: No TTP, normal bowel sounds MSK: No asymmetry or muscle atrophy.  Skin: no lesions noted on exposed skin Neuro: Alert and oriented x4. CN grossly intact Psych: Normal mood and normal affect    Labs on Admission: I have personally reviewed following labs and imaging studies  CBC: Recent Labs   Lab 10/26/24 1336  WBC 9.9  NEUTROABS 5.0  HGB 13.1  HCT 40.1  MCV 97.6  PLT 347   Basic Metabolic Panel: Recent Labs  Lab 10/26/24 1336 10/26/24 1540  NA 141  --   K 3.6  --   CL 101  --   CO2 32  --   GLUCOSE 113*  --   BUN 18  --   CREATININE 0.71  --   CALCIUM  9.2  --   MG  --  2.2   GFR: Estimated Creatinine Clearance: 57.3 mL/min (by C-G formula based on SCr of 0.71 mg/dL). Liver Function Tests: Recent Labs  Lab 10/26/24 1336  AST 21  ALT 12  ALKPHOS 48  BILITOT <0.2  PROT 6.7  ALBUMIN 4.3   No results for input(s): LIPASE, AMYLASE in the last 168 hours. No results for input(s): AMMONIA in the last 168 hours. Coagulation Profile: No results for input(s): INR, PROTIME in the last 168 hours. Cardiac Enzymes: No results for input(s): CKTOTAL, CKMB, CKMBINDEX, TROPONINI, TROPONINIHS in the last 168 hours. BNP (last 3 results) Recent Labs    09/09/24 0825  BNP 10.3   HbA1C: No results for input(s): HGBA1C in the last 72 hours. CBG: No results for input(s): GLUCAP in the last 168 hours. Lipid Profile: No results for input(s): CHOL, HDL, LDLCALC, TRIG, CHOLHDL, LDLDIRECT in the last 72 hours. Thyroid  Function Tests: No results for input(s): TSH, T4TOTAL, FREET4, T3FREE, THYROIDAB in the last 72 hours. Anemia Panel: No results for input(s): VITAMINB12, FOLATE, FERRITIN, TIBC, IRON, RETICCTPCT in the last 72 hours. Urine analysis:    Component Value Date/Time   COLORURINE YELLOW 10/30/2022 1050   APPEARANCEUR HAZY (A) 10/30/2022 1050   LABSPEC 1.015 10/30/2022 1050   PHURINE 7.5 10/30/2022 1050   GLUCOSEU NEGATIVE 10/30/2022 1050   HGBUR NEGATIVE 10/30/2022 1050   BILIRUBINUR NEGATIVE 10/30/2022 1050   KETONESUR NEGATIVE 10/30/2022 1050   PROTEINUR NEGATIVE 10/30/2022 1050   NITRITE NEGATIVE 10/30/2022 1050   LEUKOCYTESUR SMALL (A) 10/30/2022 1050    Radiological Exams on Admission: I  have personally reviewed images DG Chest 2 View Result Date: 10/26/2024 CLINICAL DATA:  Shortness of breath.  Concern for pneumonia. EXAM: CHEST - 2 VIEW COMPARISON:  Chest radiograph dated 09/12/2024. FINDINGS: No focal consolidation, pleural effusion or pneumothorax. The cardiac silhouette is within normal limits. No acute osseous pathology.  IMPRESSION: No active cardiopulmonary disease. Electronically Signed   By: Vanetta Chou M.D.   On: 10/26/2024 14:34    EKG: My personal interpretation of EKG shows: Sinus rhythm without any acute ST changes.    Assessment/Plan Principal Problem:   COPD exacerbation (HCC) Active Problems:   Centrilobular emphysema (HCC)   Pre-diabetes   Generalized anxiety disorder with panic attacks   Insomnia   Asthma   COPD Exacerbation Pt with cardinal symptoms of COPD exacerbation including worsening dyspnea on exertion and coughing. Pt status post nebulizers, and Solumedrol.  At my evaluation she was satting 97% on 4 L.  Did discuss that she was around this baseline last time she was discharged.  Patient states after discharge she was not using oxygen  and has been doing well but over the last week she has been requiring oxygen .  She states her pulmonologist referred her to [facility] for lung transplant.  She was supposed to get PFTs done yesterday but stated her breathing is not well to undergo any testing.  Do suspect some underlying anxiety regarding her lung function playing a role in her presentation.  Did review the note by pulmonology where does mention patient does not use oxygen  during daytime hours so there is component of COPD exacerbation here as well.  She has picked up her Dupixent yesterday but has not injected.  Dispense report does show she had 2 dispenses of Dupixent in October.  She states she has to go to the pulmonology office to get this done.  Given her overall anxiety and poor lung function did discuss getting pulmonology on board but patient  refused stating she only wants to see Dr. Aleskerov.  She does have an appointment on 11/25 with him. - Start prednisone  40mg  for 5 day course, patient on azithromycin  Monday Wednesday Friday.  Will continue that here. - DuoNebs q6hr - Keep O2 sat 88-92 % - Wean off Oxygen  if able - C/w home inhaler regimen. Can use alternatives as needed and use nebulizer until respiratory status improves.  Asthma: DuoNebs as above and montelukast .  GAD/MDD: Continue home meds  Insomnia: Continue home meds  GERD: Continue home PPI  Protein calorie malnutrition: Likely secondary to COPD leading to cachexia.  Started on protein supplementation.  Hyperlipidemia: Continue home meds VTE prophylaxis:  Lovenox   Diet: Heart healthy Code Status:  Full Code Telemetry:  Admission status: Observation, Med-Surg Patient is from: Home Anticipated d/c is to: Home Anticipated d/c is in: 1-2 days   Family Communication: Updated at bedside  Consults called: None   Severity of Illness: The appropriate patient status for this patient is OBSERVATION. Observation status is judged to be reasonable and necessary in order to provide the required intensity of service to ensure the patient's safety. The patient's presenting symptoms, physical exam findings, and initial radiographic and laboratory data in the context of their medical condition is felt to place them at decreased risk for further clinical deterioration. Furthermore, it is anticipated that the patient will be medically stable for discharge from the hospital within 2 midnights of admission.    Brandi Bathe, MD [facility]olynn DEL. [facility]

## 2024-10-26 NOTE — ED Notes (Signed)
 This RN performed a ambulation pulse ox trial with pt on 4LNC. Pt ambulated with minimal assistance and O2 dropped to 94% on 4LNC while ambulating. Nicholaus MD made aware.

## 2024-10-26 NOTE — ED Notes (Signed)
 CCMD called to initiate cardiac monitoring.

## 2024-10-26 NOTE — ED Triage Notes (Signed)
 Pt arrived from home via [facility] d/t shortness of breath. Per EMS, pt is on 4LNC at baseline d/t COPD. EMS reports that while walking from porch to stretcher, pt desatted to 90% but was consistently at 95% in ambulance. EMS reports giving pt a duoneb and that fire gave pt an albuterol  treatment. Pt is AO x4. Upon arrival, pt is exhibiting dyspnea.

## 2024-10-27 DIAGNOSIS — J441 Chronic obstructive pulmonary disease with (acute) exacerbation: Secondary | ICD-10-CM | POA: Diagnosis not present

## 2024-10-27 LAB — RESPIRATORY PANEL BY PCR

## 2024-10-27 LAB — CBC
HCT: 36 % (ref 36.0–46.0)
Hemoglobin: 11.8 g/dL — ABNORMAL LOW (ref 12.0–15.0)
MCH: 31.8 pg (ref 26.0–34.0)
MCHC: 32.8 g/dL (ref 30.0–36.0)
MCV: 97 fL (ref 80.0–100.0)
Platelets: 329 K/uL (ref 150–400)
RBC: 3.71 MIL/uL — ABNORMAL LOW (ref 3.87–5.11)
RDW: 11.9 % (ref 11.5–15.5)
WBC: 10 K/uL (ref 4.0–10.5)
nRBC: 0 % (ref 0.0–0.2)

## 2024-10-27 LAB — BASIC METABOLIC PANEL WITH GFR
Anion gap: 10 (ref 5–15)
BUN: 24 mg/dL — ABNORMAL HIGH (ref 8–23)
CO2: 28 mmol/L (ref 22–32)
Calcium: 9.2 mg/dL (ref 8.9–10.3)
Chloride: 103 mmol/L (ref 98–111)
Creatinine, Ser: 0.61 mg/dL (ref 0.44–1.00)
GFR, Estimated: >60 mL/min (ref >60)
Glucose, Bld: 128 mg/dL — ABNORMAL HIGH (ref 70–99)
Potassium: 4.2 mmol/L (ref 3.5–5.1)
Sodium: 142 mmol/L (ref 135–145)

## 2024-10-27 LAB — GLUCOSE, CAPILLARY: Glucose-Capillary: 103 mg/dL — ABNORMAL HIGH (ref 70–99)

## 2024-10-27 MED ORDER — ALBUTEROL SULFATE (2.5 MG/3ML) 0.083% IN NEBU
2.5000 mg | INHALATION_SOLUTION | RESPIRATORY_TRACT | Status: DC | PRN
  Administered 2024-10-27 – 2024-10-28 (×4): 2.5 mg via RESPIRATORY_TRACT
  Filled 2024-10-27 (×4): qty 3

## 2024-10-27 MED ORDER — IPRATROPIUM-ALBUTEROL 0.5-2.5 (3) MG/3ML IN SOLN
3.0000 mL | Freq: Four times a day (QID) | RESPIRATORY_TRACT | Status: DC
  Administered 2024-10-27: 3 mL via RESPIRATORY_TRACT
  Filled 2024-10-27: qty 3

## 2024-10-27 MED ORDER — SULFAMETHOXAZOLE-TRIMETHOPRIM 400-80 MG PO TABS
1.0000 | ORAL_TABLET | ORAL | Status: DC
  Administered 2024-10-27: 1 via ORAL
  Filled 2024-10-27: qty 1

## 2024-10-27 MED ORDER — BUDESON-GLYCOPYRROL-FORMOTEROL 160-9-4.8 MCG/ACT IN AERO
2.0000 | INHALATION_SPRAY | Freq: Two times a day (BID) | RESPIRATORY_TRACT | Status: DC
  Administered 2024-10-27 – 2024-10-28 (×2): 2 via RESPIRATORY_TRACT
  Filled 2024-10-27 (×2): qty 5.9

## 2024-10-27 NOTE — Progress Notes (Signed)
 PROGRESS NOTE    Brandi [name]  FMW:[id number] DOB: [birth year] DOA: 10/26/2024 PCP: Edman [name], DO  Outpatient Specialists: pulm    Brief Narrative:   From admission h and p   Brandi [name] is a 68 y.o. year old female with medical history of COPD, asthma, hypertension, hyperlipidemia presenting to the ED with exertional dyspnea.     Pt states she uses oxygen  nightly and does not use it during the day.  But overall the last week she has been having to use it during the day and has been becoming concerned as she lives alone.  She has been having some coughing but no sputum production.  She denies any fevers or chills.  She denies any myalgias or any other signs of infection.  When asked if she has any sick contacts, she states she lives alone and does not go out much and even avoids going to church since her last hospitalization to prevent sick contacts.     On arrival to the ED patient was noted to be HDS stable.  Lab work and imaging obtained.  CBC without leukocytosis.  Normal hemoglobin.  Elevated absolute eosinophil count, BMP with mild hyperglycemia otherwise unremarkable, proBNP negative, troponin negative, VBG with 7.3 7/66/38 on arrival.  Respiratory panel negative for flu, COVID, RSV.  Chest x-ray without any acute findings.  Given her complaint of worsening dyspnea on exertion, and new oxygen  requirement, [facility] contacted for admission.  Assessment & Plan:   Principal Problem:   COPD exacerbation (HCC) Active Problems:   Centrilobular emphysema (HCC)   Pre-diabetes   Generalized anxiety disorder with panic attacks   Insomnia   Asthma  # COPD with exacerbation Several days worsening exertional dyspnea. No cough, nothing coming up. CXR clear. Severe copd, recent plan to start dupixent. No chest pain, normal trops and bnp. On chronic prednisone  5 mg at home - pulm to see, recs appreciated - continue prednisone  and azithromycin  for now - continue duonebs - f/u  rvp - home bactrim   # Acute on chronic hypoxic respiratory failure On o2 at home though mostly at night, with several days of increased dyspnea and o2 requirement. Stable on 4 liters here - O2, wean as able, titrate to 88-92  # GAD - home lorazepam    DVT prophylaxis: lovenox  Code Status: full Family Communication: none at bedside  Level of care: Med-Surg Status is: Observation    Consultants:  pulm  Procedures: none  Antimicrobials:  azithromycin     Subjective: Reports stable dyspnea, none at rest  Objective: Vitals:   10/26/24 2237 10/27/24 0432 10/27/24 0435 10/27/24 0841  BP: 123/81 (!) 102/59  103/66  Pulse: (!) 106 79  86  Resp: 16 17  16   Temp: 97.6 F (36.4 C) 97.8 F (36.6 C)  98 F (36.7 C)  TempSrc:  Oral  Oral  SpO2: 97% 100%  100%  Weight:   57.2 kg   Height:        Intake/Output Summary (Last 24 hours) at 10/27/2024 1018 Last data filed at 10/26/2024 2350 Gross per 24 hour  Intake 240 ml  Output --  Net 240 ml   Filed Weights   10/26/24 1337 10/27/24 0435  Weight: 55.3 kg 57.2 kg    Examination:  General exam: Appears calm and comfortable  Respiratory system: few faint rhonchi otherwise clear Cardiovascular system: S1 & S2 heard, RRR. No JVD, murmurs, rubs, gallops or clicks. No pedal edema. Gastrointestinal system: Abdomen is nondistended, soft and nontender. No  organomegaly or masses felt. Normal bowel sounds heard. Central nervous system: Alert and oriented. No focal neurological deficits. Extremities: Symmetric 5 x 5 power. Skin: No rashes, lesions or ulcers Psychiatry: Judgement and insight appear normal. Mood & affect appropriate.     Data Reviewed: I have personally reviewed following labs and imaging studies  CBC: Recent Labs  Lab 10/26/24 1336 10/27/24 0420  WBC 9.9 10.0  NEUTROABS 5.0  --   HGB 13.1 11.8*  HCT 40.1 36.0  MCV 97.6 97.0  PLT 347 329   Basic Metabolic Panel: Recent Labs  Lab 10/26/24 1336  10/26/24 1540 10/27/24 0420  NA 141  --  142  K 3.6  --  4.2  CL 101  --  103  CO2 32  --  28  GLUCOSE 113*  --  128*  BUN 18  --  24*  CREATININE 0.71  --  0.61  CALCIUM  9.2  --  9.2  MG  --  2.2  --    GFR: Estimated Creatinine Clearance: 57.3 mL/min (by C-G formula based on SCr of 0.61 mg/dL). Liver Function Tests: Recent Labs  Lab 10/26/24 1336  AST 21  ALT 12  ALKPHOS 48  BILITOT <0.2  PROT 6.7  ALBUMIN 4.3   No results for input(s): LIPASE, AMYLASE in the last 168 hours. No results for input(s): AMMONIA in the last 168 hours. Coagulation Profile: No results for input(s): INR, PROTIME in the last 168 hours. Cardiac Enzymes: No results for input(s): CKTOTAL, CKMB, CKMBINDEX, TROPONINI in the last 168 hours. BNP (last 3 results) Recent Labs    10/26/24 1336  PROBNP <50.0   HbA1C: No results for input(s): HGBA1C in the last 72 hours. CBG: Recent Labs  Lab 10/27/24 0839  GLUCAP 103*   Lipid Profile: No results for input(s): CHOL, HDL, LDLCALC, TRIG, CHOLHDL, LDLDIRECT in the last 72 hours. Thyroid  Function Tests: No results for input(s): TSH, T4TOTAL, FREET4, T3FREE, THYROIDAB in the last 72 hours. Anemia Panel: No results for input(s): VITAMINB12, FOLATE, FERRITIN, TIBC, IRON, RETICCTPCT in the last 72 hours. Urine analysis:    Component Value Date/Time   COLORURINE YELLOW 10/30/2022 1050   APPEARANCEUR HAZY (A) 10/30/2022 1050   LABSPEC 1.015 10/30/2022 1050   PHURINE 7.5 10/30/2022 1050   GLUCOSEU NEGATIVE 10/30/2022 1050   HGBUR NEGATIVE 10/30/2022 1050   BILIRUBINUR NEGATIVE 10/30/2022 1050   KETONESUR NEGATIVE 10/30/2022 1050   PROTEINUR NEGATIVE 10/30/2022 1050   NITRITE NEGATIVE 10/30/2022 1050   LEUKOCYTESUR SMALL (A) 10/30/2022 1050   Sepsis Labs: @LABRCNTIP (procalcitonin:4,lacticidven:4)  ) Recent Results (from the past 240 hours)  Resp panel by RT-PCR (RSV, Flu A&B, Covid)  Anterior Nasal Swab     Status: None   Collection Time: 10/26/24  2:15 PM   Specimen: Anterior Nasal Swab  Result Value Ref Range Status   SARS Coronavirus 2 by RT PCR NEGATIVE NEGATIVE Final    Comment: (NOTE) SARS-CoV-2 target nucleic acids are NOT DETECTED.  The SARS-CoV-2 RNA is generally detectable in upper respiratory specimens during the acute phase of infection. The lowest concentration of SARS-CoV-2 viral copies this assay can detect is 138 copies/mL. A negative result does not preclude SARS-Cov-2 infection and should not be used as the sole basis for treatment or other patient management decisions. A negative result may occur with  improper specimen collection/handling, submission of specimen other than nasopharyngeal swab, presence of viral mutation(s) within the areas targeted by this assay, and inadequate number of viral copies(<138 copies/mL). A  negative result must be combined with clinical observations, patient history, and epidemiological information. The expected result is Negative.  Fact Sheet for Patients:  bloggercourse.com  Fact Sheet for Healthcare Providers:  seriousbroker.it  This test is no t yet approved or cleared by the [place]  FDA and  has been authorized for detection and/or diagnosis of SARS-CoV-2 by FDA under an Emergency Use Authorization (EUA). This EUA will remain  in effect (meaning this test can be used) for the duration of the COVID-19 declaration under Section 564(b)(1) of the Act, 21 U.S.C.section 360bbb-3(b)(1), unless the authorization is terminated  or revoked sooner.       Influenza A by PCR NEGATIVE NEGATIVE Final   Influenza B by PCR NEGATIVE NEGATIVE Final    Comment: (NOTE) The Xpert Xpress SARS-CoV-2/FLU/RSV plus assay is intended as an aid in the diagnosis of influenza from Nasopharyngeal swab specimens and should not be used as a sole basis for treatment. Nasal washings  and aspirates are unacceptable for Xpert Xpress SARS-CoV-2/FLU/RSV testing.  Fact Sheet for Patients: bloggercourse.com  Fact Sheet for Healthcare Providers: seriousbroker.it  This test is not yet approved or cleared by the [place]  FDA and has been authorized for detection and/or diagnosis of SARS-CoV-2 by FDA under an Emergency Use Authorization (EUA). This EUA will remain in effect (meaning this test can be used) for the duration of the COVID-19 declaration under Section 564(b)(1) of the Act, 21 U.S.C. section 360bbb-3(b)(1), unless the authorization is terminated or revoked.     Resp Syncytial Virus by PCR NEGATIVE NEGATIVE Final    Comment: (NOTE) Fact Sheet for Patients: bloggercourse.com  Fact Sheet for Healthcare Providers: seriousbroker.it  This test is not yet approved or cleared by the [place]  FDA and has been authorized for detection and/or diagnosis of SARS-CoV-2 by FDA under an Emergency Use Authorization (EUA). This EUA will remain in effect (meaning this test can be used) for the duration of the COVID-19 declaration under Section 564(b)(1) of the Act, 21 U.S.C. section 360bbb-3(b)(1), unless the authorization is terminated or revoked.  Performed at [facility], [address] [place], [place] [address]          Radiology Studies: DG Chest 2 View Result Date: 10/26/2024 CLINICAL DATA:  Shortness of breath.  Concern for pneumonia. EXAM: CHEST - 2 VIEW COMPARISON:  Chest radiograph dated 09/12/2024. FINDINGS: No focal consolidation, pleural effusion or pneumothorax. The cardiac silhouette is within normal limits. No acute osseous pathology. IMPRESSION: No active cardiopulmonary disease. Electronically Signed   By: Vanetta Chou M.D.   On: 10/26/2024 14:34        Scheduled Meds:  aspirin  EC  81 mg Oral Daily   azithromycin   250  mg Oral Once per day on Monday Wednesday Friday   budesonide -glycopyrrolate -formoterol   2 puff Inhalation BID   busPIRone   7.5 mg Oral BID   enoxaparin  (LOVENOX ) injection  40 mg Subcutaneous Q24H   feeding supplement  237 mL Oral BID BM   furosemide   20 mg Oral Daily   montelukast   10 mg Oral QHS   multivitamin with minerals  1 tablet Oral Daily   pantoprazole   40 mg Oral Daily   predniSONE   40 mg Oral Q breakfast   QUEtiapine   25 mg Oral QHS   rosuvastatin   10 mg Oral Daily   sodium chloride  flush  3 mL Intravenous Q12H   Continuous Infusions:   LOS: 0 days     Devaughn KATHEE Ban, MD [facility]  If 7PM-7AM, please contact night-coverage [email or web address] Password [email or web address] 10/27/2024, 10:18 AM

## 2024-10-27 NOTE — Care Management Obs Status (Signed)
 MEDICARE OBSERVATION STATUS NOTIFICATION   Patient Details  Name: Brandi [name] MRN: [id number] Date of Birth: [birth year]   Medicare Observation Status Notification Given:  Yes    [name] 10/27/2024, 1:33 PM

## 2024-10-27 NOTE — Consult Note (Signed)
 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  She has quite a bit of anxiety due to living alone and having chronic breathing issues with hypoxemia.  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 She also has a background of dyslipidemia.  She has been on inhaler therapy for COPD and asthma as well as recent introduction of prednisone  at 5 mg/day with Bactrim  thrice weekly.  She felt initially improved but reports approximately 1 week ago onset of chest discomfort with worsening hypoxemia.  On admission she had a viral workup done and is negative for RSV/COVID/flu.  She is being treated for acute exacerbation of asthma and COPD with prednisone  40 mg as well as nebulizer therapy and her home Breztri  twice daily she also has rhinitis and is taking Singulair  for this which has been continued.   PAST MEDICAL HISTORY   Past Medical History:  Diagnosis Date   Anxiety    COPD (chronic obstructive pulmonary disease) (HCC)    Hyperlipidemia      SURGICAL HISTORY   Past Surgical History:  Procedure Laterality Date   APPENDECTOMY     CHOLECYSTECTOMY     SHOULDER ARTHROSCOPY Left 07/02/2015   Procedure: ,left shoulder arthroscopy, decompression and debridement;  Surgeon: Norleen JINNY Maltos, MD;  Location: [facility];  Service: Orthopedics;  Laterality: Left;   SHOULDER CLOSED REDUCTION Left 04/09/2015   Procedure: CLOSED MANIPULATION SHOULDER;  Surgeon: Ozell Flake, MD;  Location: [facility];  Service: Orthopedics;  Laterality: Left;   TUBAL LIGATION        FAMILY HISTORY   Family History  Problem Relation Age of Onset   Breast cancer Mother 35   Alzheimer's disease Mother      SOCIAL HISTORY   Social History   Tobacco Use   Smoking status: Former    Current packs/day: 0.00    Average packs/day: 0.8 packs/day for 41.0 years (30.8 ttl pk-yrs)    Types: Cigarettes    Start date: 12/07/1974    Quit date: 12/08/2015    Years since quitting: 8.8   Smokeless tobacco: Never  Vaping Use   Vaping status: Never Used  Substance Use Topics   Alcohol use: No   Drug use: No     MEDICATIONS    Home Medication:    Current Medication:  Current Facility-Administered Medications:    acetaminophen  (TYLENOL ) tablet 650 mg, 650 mg, Oral, Q6H PRN **OR** acetaminophen  (TYLENOL ) suppository 650 mg, 650 mg, Rectal, Q6H PRN, Fernand Prost, MD   aspirin  EC tablet 81 mg, 81 mg, Oral, Daily, Cleatus Hoof V, MD, 81 mg at 10/27/24 9156   azithromycin  (ZITHROMAX ) tablet 250 mg, 250 mg, Oral, Once per day on Monday Wednesday Friday, Fernand Prost, MD, 250 mg at 10/27/24 9156   benzonatate  (TESSALON ) capsule 100 mg, 100 mg, Oral, TID PRN, Cleatus Hoof GAILS, MD   budesonide -glycopyrrolate -formoterol  (BREZTRI ) 160-9-4.8 MCG/ACT inhaler 2 puff, 2 puff, Inhalation, BID, Cleatus Hoof GAILS, MD, 2 puff  at 10/27/24 0846   busPIRone  (BUSPAR ) tablet 7.5 mg, 7.5 mg, Oral, BID, Cleatus Hoof V, MD, 7.5 mg at 10/27/24 0843   enoxaparin  (LOVENOX ) injection 40 mg, 40 mg, Subcutaneous, Q24H, Fernand Prost, MD, 40 mg at 10/26/24 2116   famotidine  (PEPCID ) tablet 20 mg, 20 mg, Oral, BID PRN, Duncan, Hazel V, MD   feeding supplement (ENSURE ENLIVE / ENSURE PLUS) liquid 237 mL, 237 mL, Oral, BID BM, Fernand Prost, MD, 237 mL at 10/27/24 0846   furosemide  (LASIX ) tablet 20 mg, 20 mg, Oral, Daily, Cleatus Hoof V, MD, 20 mg at 10/27/24 0844   LORazepam  (ATIVAN ) tablet 0.5 mg, 0.5 mg, Oral, Q8H PRN, Cleatus Hoof GAILS, MD   montelukast  (SINGULAIR ) tablet 10 mg, 10 mg, Oral, QHS,  Cleatus Hoof GAILS, MD, 10 mg at 10/26/24 2226   multivitamin with minerals tablet 1 tablet, 1 tablet, Oral, Daily, Cleatus Hoof GAILS, MD, 1 tablet at 10/27/24 0843   pantoprazole  (PROTONIX ) EC tablet 40 mg, 40 mg, Oral, Daily, Cleatus Hoof V, MD, 40 mg at 10/27/24 9156   predniSONE  (DELTASONE ) tablet 40 mg, 40 mg, Oral, Q breakfast, Fernand Prost, MD, 40 mg at 10/27/24 9156   QUEtiapine  (SEROQUEL ) tablet 25 mg, 25 mg, Oral, QHS, Cleatus Hoof V, MD, 25 mg at 10/26/24 2226   rosuvastatin  (CRESTOR ) tablet 10 mg, 10 mg, Oral, Daily, Cleatus Hoof V, MD, 10 mg at 10/27/24 0843   senna-docusate (Senokot-S) tablet 1 tablet, 1 tablet, Oral, QHS PRN, Fernand Prost, MD   sodium chloride  flush (NS) 0.9 % injection 3 mL, 3 mL, Intravenous, Q12H, Fernand Prost, MD, 3 mL at 10/27/24 0847    ALLERGIES   Patient has no known allergies.     REVIEW OF SYSTEMS    Review of Systems:  Gen:  Denies  fever, sweats, chills weigh loss  HEENT: Denies blurred vision, double vision, ear pain, eye pain, hearing loss, nose bleeds, sore throat Cardiac:  No dizziness, chest pain or heaviness, chest tightness,edema Resp:   reports dyspnea chronically  Gi: Denies swallowing difficulty, stomach pain, nausea or vomiting, diarrhea, constipation, bowel incontinence Gu:  Denies bladder incontinence, burning urine Ext:   Denies Joint pain, stiffness or swelling Skin: Denies  skin rash, easy bruising or bleeding or hives Endoc:  Denies polyuria, polydipsia , polyphagia or weight change Psych:   Denies depression, insomnia or hallucinations   Other:  All other systems negative   VS: BP 103/66 (BP Location: Right Arm)   Pulse 86   Temp 98 F (36.7 C) (Oral)   Resp 16   Ht 5' 4 (1.626 m)   Wt 57.2 kg   SpO2 100%   BMI 21.65 kg/m      PHYSICAL EXAM    GENERAL:NAD, no fevers, chills, no weakness no fatigue HEAD: Normocephalic, atraumatic.  EYES: Pupils equal, round, reactive to light. Extraocular muscles  intact. No scleral icterus.  MOUTH: Moist mucosal membrane. Dentition intact. No abscess noted.  EAR, NOSE, THROAT: Clear without exudates. No external lesions.  NECK: Supple. No thyromegaly. No nodules. No JVD.  PULMONARY: decreased breath sounds with mild rhonchi worse at bases bilaterally.  CARDIOVASCULAR: S1 and S2. Regular rate and rhythm. No murmurs, rubs, or gallops. No edema. Pedal pulses 2+ bilaterally.  GASTROINTESTINAL: Soft, nontender, nondistended. No masses. Positive bowel sounds. No hepatosplenomegaly.  MUSCULOSKELETAL: No swelling, clubbing, or edema. Range of motion full in all extremities.  NEUROLOGIC: Cranial nerves II through XII are intact. No gross focal neurological deficits. Sensation intact. Reflexes  intact.  SKIN: No ulceration, lesions, rashes, or cyanosis. Skin warm and dry. Turgor intact.  PSYCHIATRIC: Mood, affect within normal limits. The patient is awake, alert and oriented x 3. Insight, judgment intact.       IMAGING   @IMAGES @  Narrative & Impression EXAM: 1 VIEW(S) XRAY OF THE CHEST 09/09/2024 08:31:00 AM   COMPARISON: 07/18/2024   CLINICAL HISTORY: sob, copd. Table formatting from the original note was not included.; patient coming in from home SOB for 2 weeks. Patient coming in on c-pap via EMS. ; hx of asthma, copd,. inital o2 67% on 4L [place] that she wears at home. patient was blue and mottled with ac3essory muscle use. 5 mg allbuterol, 2mg  mag, 125mg  solumed. Lung sounds were absent for EMS   FINDINGS:   LUNGS AND PLEURA: Hyperinflation with emphysema. No focal pulmonary opacity. No pulmonary edema. No pleural effusion. No pneumothorax.   HEART AND MEDIASTINUM: Aortic atherosclerosis. No acute abnormality of the cardiac and mediastinal silhouettes.   BONES AND SOFT TISSUES: No acute osseous abnormality.   IMPRESSION: 1. Hyperinflation with emphysema, consistent with COPD. 2. Aortic atherosclerosis.   Electronically signed by:  Evalene Coho MD 09/09/2024 08:38 AM EDT RP Workstation: [id number]     ASSESSMENT/PLAN   Acute exacerbation of asthma and COPD overlap (ACOS) Continue Singulair  10 mg p.o. nightly - Continue Breztri  Aerosphere twice daily - Wean prednisone  taper - RVP to rule out viral induced Acute exacerbation  - Pulmonary rehab once at chronic stable state and consider inpatient rehab postdischarge - Absence of leukocytosis on admission and development of mild leukocytosis most likely due to glucocorticoid demargination -patient has received her biologic therapy but she should wait until exacerbation is resolved to use it           Thank you for allowing me to participate in the care of this patient.   Patient/Family are satisfied with care plan and all questions have been answered.    Provider disclosure: Patient with at least one acute or chronic illness or injury that poses a threat to life or bodily function and is being managed actively during this encounter.  All of the below services have been performed independently by signing provider:  review of prior documentation from internal and or external health records.  Review of previous and current lab results.  Interview and comprehensive assessment during patient visit today. Review of current and previous chest radiographs/CT scans. Discussion of management and test interpretation with health care team and patient/family.   This document was prepared using Dragon voice recognition software and may include unintentional dictation errors.     Tarea Skillman, M.D.  Division of Pulmonary & Critical Care Medicine

## 2024-10-27 NOTE — Plan of Care (Signed)

## 2024-10-27 NOTE — Plan of Care (Signed)
  Problem: Education: Goal: Knowledge of General Education information will improve Description: Including pain rating scale, medication(s)/side effects and non-pharmacologic comfort measures Outcome: Progressing   Problem: Clinical Measurements: Goal: Ability to maintain clinical measurements within normal limits will improve Outcome: Progressing Goal: Diagnostic test results will improve Outcome: Progressing Goal: Respiratory complications will improve Outcome: Progressing Goal: Cardiovascular complication will be avoided Outcome: Progressing   Problem: Activity: Goal: Risk for activity intolerance will decrease Outcome: Progressing   Problem: Nutrition: Goal: Adequate nutrition will be maintained Outcome: Progressing   Problem: Coping: Goal: Level of anxiety will decrease Outcome: Progressing   Problem: Elimination: Goal: Will not experience complications related to bowel motility Outcome: Progressing Goal: Will not experience complications related to urinary retention Outcome: Progressing   Problem: Pain Managment: Goal: General experience of comfort will improve and/or be controlled Outcome: Progressing   Problem: Safety: Goal: Ability to remain free from injury will improve Outcome: Progressing   Problem: Skin Integrity: Goal: Risk for impaired skin integrity will decrease Outcome: Progressing

## 2024-10-28 DIAGNOSIS — J441 Chronic obstructive pulmonary disease with (acute) exacerbation: Secondary | ICD-10-CM | POA: Diagnosis not present

## 2024-10-28 LAB — GLUCOSE, CAPILLARY: Glucose-Capillary: 91 mg/dL (ref 70–99)

## 2024-10-28 LAB — BASIC METABOLIC PANEL WITH GFR
Anion gap: 8 (ref 5–15)
BUN: 23 mg/dL (ref 8–23)
CO2: 31 mmol/L (ref 22–32)
Calcium: 8.9 mg/dL (ref 8.9–10.3)
Chloride: 102 mmol/L (ref 98–111)
Creatinine, Ser: 0.57 mg/dL (ref 0.44–1.00)
GFR, Estimated: >60 mL/min (ref >60)
Glucose, Bld: 93 mg/dL (ref 70–99)
Potassium: 4.1 mmol/L (ref 3.5–5.1)
Sodium: 141 mmol/L (ref 135–145)

## 2024-10-28 MED ORDER — PREDNISONE 5 MG PO TABS: ORAL_TABLET | ORAL | 5 refills | Status: AC

## 2024-10-28 NOTE — Plan of Care (Signed)
 IV removed, discharge instructions reviewed, patient very tearful and anxious about going home, pulmonology dicussed options for her as her COPD has progressed, patient discharged to home with her home oxygen 

## 2024-10-28 NOTE — Evaluation (Signed)
 Physical Therapy Evaluation Patient Details Name: Brandi [name] MRN: [id number] DOB: 05/22/1956 Today's Date: 10/28/2024  History of Present Illness  Brandi [name] is a 68 y.o. year old female with medical history of COPD, asthma, hypertension, hyperlipidemia presenting to the ED with exertional dyspnea.  Clinical Impression  Brandi [name] is a pleasant 68 year old female who was admitted for exertional dyspnea. Currently on 4L- baseline with sats WNL with exertion, however Brandi [name] very symptomatic with ambulation, limiting functional independence at this time. Brandi [name] performs transfers with mod I and ambulation with supervision and no AD. Brandi [name] demonstrates deficits with endurance/mobility/IADLs. Would benefit from skilled Brandi [name] to address above deficits and promote optimal return to PLOF. Brandi [name] will continue to receive skilled Brandi [name] services while admitted and will defer to TOC/care team for updates regarding disposition planning.       If plan is discharge home, recommend the following: A little help with walking and/or transfers;A little help with bathing/dressing/bathroom;Help with stairs or ramp for entrance;Assistance with cooking/housework   Can travel by private vehicle        Equipment Recommendations None recommended by Brandi [name]  Recommendations for Other Services       Functional Status Assessment Patient has had a recent decline in their functional status and demonstrates the ability to make significant improvements in function in a reasonable and predictable amount of time.     Precautions / Restrictions Precautions Precautions: None Recall of Precautions/Restrictions: Intact Restrictions Weight Bearing Restrictions Per Provider Order: No      Mobility  Bed Mobility               General bed mobility comments: NT, received in recliner    Transfers Overall transfer level: Modified independent Equipment used: None               General transfer comment: safe technique. All mobility  performed on 4L of O2    Ambulation/Gait Ambulation/Gait assistance: Supervision Gait Distance (Feet): 40 Feet Assistive device: None Gait Pattern/deviations: Step-through pattern       General Gait Details: ambulated in room with supervision. Occasionally reaching out for furniture. Offerred RW, however Brandi [name] refused. All mobility performed on 4L with sats at 97%. Brandi [name] rates RPE with exertion at 9/10- limiting further mobility  Stairs            Wheelchair Mobility     Tilt Bed    Modified Rankin (Stroke Patients Only)       Balance Overall balance assessment: Mild deficits observed, not formally tested                                           Pertinent Vitals/Pain Pain Assessment Pain Assessment: No/denies pain    Home Living Family/patient expects to be discharged to:: Private residence Living Arrangements: Alone Available Help at Discharge: Other (Comment);Available PRN/intermittently;Family;Neighbor;Friend(s) Type of Home: House Home Access: Stairs to enter Entrance Stairs-Rails: Right Entrance Stairs-Number of Steps: 3   Home Layout: One level Home Equipment: Grab bars - tub/shower;Grab bars - toilet;Rollator (4 wheels);BSC/3in1;Shower seat - built in Additional Comments: has daughter who lives out of town    Prior Function Prior Level of Function : Needs assist;Driving             Mobility Comments: denies falls history, was indep PTA, 4L of O2 available in home, was open with HHPT ADLs Comments: indep  Extremity/Trunk Assessment   Upper Extremity Assessment Upper Extremity Assessment: Overall WFL for tasks assessed    Lower Extremity Assessment Lower Extremity Assessment: Generalized weakness       Communication   Communication Communication: No apparent difficulties    Cognition Arousal: Alert Behavior During Therapy: WFL for tasks assessed/performed   Brandi [name] - Cognitive impairments: No apparent impairments                        Brandi [name] - Cognition Comments: pleasant and agreeable to session Following commands: Intact       Cueing Cueing Techniques: Verbal cues     General Comments      Exercises     Assessment/Plan    Brandi [name] Assessment Patient needs continued Brandi [name] services  Brandi [name] Problem List Decreased strength;Decreased activity tolerance;Cardiopulmonary status limiting activity       Brandi [name] Treatment Interventions Gait training;DME instruction;Therapeutic activities;Therapeutic exercise    Brandi [name] Goals (Current goals can be found in the Care Plan section)  Acute Rehab Brandi [name] Goals Patient Stated Goal: to stay in the hospital until her breathing is better Brandi [name] Goal Formulation: With patient Time For Goal Achievement: 11/11/24 Potential to Achieve Goals: Good    Frequency Min 2X/week     Co-evaluation               AM-PAC Brandi [name] 6 Clicks Mobility  Outcome Measure Help needed turning from your back to your side while in a flat bed without using bedrails?: None Help needed moving from lying on your back to sitting on the side of a flat bed without using bedrails?: None Help needed moving to and from a bed to a chair (including a wheelchair)?: None Help needed standing up from a chair using your arms (e.g., wheelchair or bedside chair)?: None Help needed to walk in hospital room?: A Little Help needed climbing 3-5 steps with a railing? : A Little 6 Click Score: 22    End of Session Equipment Utilized During Treatment: Oxygen  Activity Tolerance: Patient limited by fatigue Patient left: in chair;with call bell/phone within reach Nurse Communication: Mobility status Brandi [name] Visit Diagnosis: Unsteadiness on feet (R26.81);Muscle weakness (generalized) (M62.81);Difficulty in walking, not elsewhere classified (R26.2)    Time: 9046-8990 Brandi [name] Time Calculation (min) (ACUTE ONLY): 16 min   Charges:   Brandi [name] Evaluation $Brandi [name] Eval Low Complexity: 1 Low Brandi [name] Treatments $Gait Training: 8-22 mins Brandi [name] General  Charges $$ ACUTE Brandi [name] VISIT: 1 Visit         Corean Dade, Brandi [name], DPT, G[facility] [phone]   Woods Gangemi 10/28/2024, 11:20 AM

## 2024-10-28 NOTE — TOC CM/SW Note (Addendum)
 Transition of Care (TOC) CM/SW Note    PT rec to resume HHPT services at discharge. Per Bamboo, patient is active with [facility]. Referral sent to [facility] in [place], notified Dorothe with [facility] [place] today. Per Dorothe, start of care may be Friday 11/28 due to insurance authorization delay.   Sunshyne Horvath, [place] [phone]

## 2024-10-28 NOTE — Progress Notes (Signed)
 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  She has quite a bit of anxiety due to living alone and having chronic breathing issues with hypoxemia.  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 She also has a background of dyslipidemia.  She has been on inhaler therapy for COPD and asthma as well as recent introduction of prednisone  at 5 mg/day with Bactrim  thrice weekly.  She felt initially improved but reports approximately 1 week ago onset of chest discomfort with worsening hypoxemia.  On admission she had a viral workup done and is negative for RSV/COVID/flu.  She is being treated for acute exacerbation of asthma and COPD with prednisone  40 mg as well as nebulizer therapy and her home Breztri  twice daily she also has rhinitis and is taking Singulair  for this which has been continued.  10/28/24- patient sitting up in bed and eating lunch.  She reports she is still dyspneic.  I have explained that advanced COPD is not a condition we can cure and she will benefit from biologic therapy.  She is asking if she can stay at hospital so she can have someone to take care of her.  I have explained that it is difficult to justify ongoing hospitalization solely due to anxiety of being alone at home. I will discuss this with hospitalist.  Patient states she will come back to ER due to chronic dyspnea and not having someone to care for her at home.  She relates she enjoys the food here and that she has someone to give her nebulizer treatments and help her to use bathroom.   She is close to baseline with stage 4 COPD and chronic hypoxemia.  She may be discharged home with overall poor prognosis.  I recommend palliative care or home with hospice.   PAST MEDICAL HISTORY   Past Medical History:  Diagnosis Date   Anxiety    COPD (chronic obstructive pulmonary disease) (HCC)    Hyperlipidemia      SURGICAL HISTORY   Past Surgical History:  Procedure Laterality Date   APPENDECTOMY     CHOLECYSTECTOMY     SHOULDER ARTHROSCOPY Left 07/02/2015   Procedure: ,left shoulder arthroscopy, decompression and debridement;  Surgeon: Norleen JINNY Maltos, MD;  Location: [facility] ORS;  Service: Orthopedics;  Laterality: Left;   SHOULDER CLOSED REDUCTION Left 04/09/2015   Procedure: CLOSED MANIPULATION SHOULDER;  Surgeon: Ozell Flake, MD;  Location: [facility] OR[facility];  Service: Orthopedics;  Laterality: Left;   TUBAL LIGATION       FAMILY HISTORY   Family History  Problem Relation Age of Onset   Breast cancer Mother 5   Alzheimer's disease Mother      SOCIAL HISTORY   Social History   Tobacco Use   Smoking status: Former    Current packs/day: 0.00    Average packs/day: 0.8 packs/day for 41.0 years (30.8 ttl pk-yrs)    Types: Cigarettes    Start date: 12/07/1974  Quit date: 12/08/2015    Years since quitting: 8.8   Smokeless tobacco: Never  Vaping Use   Vaping status: Never Used  Substance Use Topics   Alcohol use: No   Drug use: No     MEDICATIONS    Home Medication:    Current Medication:  Current Facility-Administered Medications:    acetaminophen  (TYLENOL ) tablet 650 mg, 650 mg, Oral, Q6H PRN **OR** acetaminophen  (TYLENOL ) suppository 650 mg, 650 mg, Rectal, Q6H PRN, Fernand Prost, MD   albuterol  (PROVENTIL ) (2.5 MG/3ML) 0.083% nebulizer solution 2.5 mg, 2.5 mg, Nebulization, Q2H PRN, Wouk, Devaughn Sayres, MD, 2.5 mg at 10/28/24 1208   aspirin  EC tablet 81 mg, 81 mg, Oral, Daily, Cleatus Hoof V, MD, 81 mg at 10/28/24 9189   azithromycin  (ZITHROMAX ) tablet 250  mg, 250 mg, Oral, Once per day on Monday Wednesday Friday, Fernand Prost, MD, 250 mg at 10/27/24 9156   benzonatate  (TESSALON ) capsule 100 mg, 100 mg, Oral, TID PRN, Cleatus Hoof GAILS, MD   budesonide -glycopyrrolate -formoterol  (BREZTRI ) 160-9-4.8 MCG/ACT inhaler 2 puff, 2 puff, Inhalation, BID, Wouk, Devaughn Sayres, MD, 2 puff at 10/28/24 9187   busPIRone  (BUSPAR ) tablet 7.5 mg, 7.5 mg, Oral, BID, Cleatus Hoof V, MD, 7.5 mg at 10/28/24 9187   enoxaparin  (LOVENOX ) injection 40 mg, 40 mg, Subcutaneous, Q24H, Khan, Ghalib, MD, 40 mg at 10/26/24 2116   famotidine  (PEPCID ) tablet 20 mg, 20 mg, Oral, BID PRN, Cleatus Hoof GAILS, MD   feeding supplement (ENSURE ENLIVE / ENSURE PLUS) liquid 237 mL, 237 mL, Oral, BID BM, Fernand Prost, MD, 237 mL at 10/27/24 0846   furosemide  (LASIX ) tablet 20 mg, 20 mg, Oral, Daily, Cleatus Hoof V, MD, 20 mg at 10/28/24 9189   LORazepam  (ATIVAN ) tablet 0.5 mg, 0.5 mg, Oral, Q8H PRN, Cleatus Hoof GAILS, MD   montelukast  (SINGULAIR ) tablet 10 mg, 10 mg, Oral, QHS, Cleatus Hoof GAILS, MD, 10 mg at 10/27/24 2118   multivitamin with minerals tablet 1 tablet, 1 tablet, Oral, Daily, Cleatus Hoof GAILS, MD, 1 tablet at 10/28/24 9188   pantoprazole  (PROTONIX ) EC tablet 40 mg, 40 mg, Oral, Daily, Cleatus Hoof V, MD, 40 mg at 10/28/24 0810   predniSONE  (DELTASONE ) tablet 40 mg, 40 mg, Oral, Q breakfast, Fernand Prost, MD, 40 mg at 10/28/24 9189   QUEtiapine  (SEROQUEL ) tablet 25 mg, 25 mg, Oral, QHS, Cleatus Hoof V, MD, 25 mg at 10/27/24 2118   rosuvastatin  (CRESTOR ) tablet 10 mg, 10 mg, Oral, Daily, Cleatus Hoof V, MD, 10 mg at 10/28/24 0810   senna-docusate (Senokot-S) tablet 1 tablet, 1 tablet, Oral, QHS PRN, Fernand Prost, MD   sodium chloride  flush (NS) 0.9 % injection 3 mL, 3 mL, Intravenous, Q12H, Fernand Prost, MD, 3 mL at 10/28/24 9188   sulfamethoxazole -trimethoprim  (BACTRIM ) 400-80 MG per tablet 1 tablet, 1 tablet, Oral, Once per day on Monday Wednesday Friday, Wouk, Devaughn Sayres, MD, 1  tablet at 10/27/24 1042    ALLERGIES   Patient has no known allergies.     REVIEW OF SYSTEMS    Review of Systems:  Gen:  Denies  fever, sweats, chills weigh loss  HEENT: Denies blurred vision, double vision, ear pain, eye pain, hearing loss, nose bleeds, sore throat Cardiac:  No dizziness, chest pain or heaviness, chest tightness,edema Resp:   reports dyspnea chronically  Gi: Denies swallowing difficulty, stomach pain, nausea or vomiting, diarrhea, constipation, bowel incontinence Gu:  Denies bladder incontinence, burning urine Ext:   Denies Joint pain, stiffness or swelling Skin: Denies  skin  rash, easy bruising or bleeding or hives Endoc:  Denies polyuria, polydipsia , polyphagia or weight change Psych:   Denies depression, insomnia or hallucinations   Other:  All other systems negative   VS: BP 113/70 (BP Location: Right Arm)   Pulse 93   Temp 98.2 F (36.8 C)   Resp 16   Ht 5' 4 (1.626 m)   Wt 57.2 kg   SpO2 100%   BMI 21.65 kg/m      PHYSICAL EXAM    GENERAL:NAD, no fevers, chills, no weakness no fatigue HEAD: Normocephalic, atraumatic.  EYES: Pupils equal, round, reactive to light. Extraocular muscles intact. No scleral icterus.  MOUTH: Moist mucosal membrane. Dentition intact. No abscess noted.  EAR, NOSE, THROAT: Clear without exudates. No external lesions.  NECK: Supple. No thyromegaly. No nodules. No JVD.  PULMONARY: decreased breath sounds with mild rhonchi worse at bases bilaterally.  CARDIOVASCULAR: S1 and S2. Regular rate and rhythm. No murmurs, rubs, or gallops. No edema. Pedal pulses 2+ bilaterally.  GASTROINTESTINAL: Soft, nontender, nondistended. No masses. Positive bowel sounds. No hepatosplenomegaly.  MUSCULOSKELETAL: No swelling, clubbing, or edema. Range of motion full in all extremities.  NEUROLOGIC: Cranial nerves II through XII are intact. No gross focal neurological deficits. Sensation intact. Reflexes intact.  SKIN: No ulceration,  lesions, rashes, or cyanosis. Skin warm and dry. Turgor intact.  PSYCHIATRIC: Mood, affect within normal limits. The patient is awake, alert and oriented x 3. Insight, judgment intact.       IMAGING   @IMAGES @  Narrative & Impression EXAM: 1 VIEW(S) XRAY OF THE CHEST 09/09/2024 08:31:00 AM   COMPARISON: 07/18/2024   CLINICAL HISTORY: sob, copd. Table formatting from the original note was not included.; patient coming in from home SOB for 2 weeks. Patient coming in on c-pap via EMS. ; hx of asthma, copd,. inital o2 67% on 4L [place] that she wears at home. patient was blue and mottled with ac3essory muscle use. 5 mg allbuterol, 2mg  mag, 125mg  solumed. Lung sounds were absent for EMS   FINDINGS:   LUNGS AND PLEURA: Hyperinflation with emphysema. No focal pulmonary opacity. No pulmonary edema. No pleural effusion. No pneumothorax.   HEART AND MEDIASTINUM: Aortic atherosclerosis. No acute abnormality of the cardiac and mediastinal silhouettes.   BONES AND SOFT TISSUES: No acute osseous abnormality.   IMPRESSION: 1. Hyperinflation with emphysema, consistent with COPD. 2. Aortic atherosclerosis.   Electronically signed by: Evalene Coho MD 09/09/2024 08:38 AM EDT RP Workstation: [id number]     ASSESSMENT/PLAN   Acute exacerbation of asthma and COPD overlap (ACOS) Continue Singulair  10 mg p.o. nightly - Continue Breztri  Aerosphere twice daily - Wean prednisone  taper - RVP -is negative  - Pulmonary rehab once at chronic stable state and consider inpatient rehab postdischarge - Absence of leukocytosis on admission and development of mild leukocytosis most likely due to glucocorticoid demargination -patient has received her biologic therapy but she should wait until exacerbation is resolved to use it -patient is asking if she can stay longer so she can have people to help her with meals and bathroom and respond to her when she feels anxious.           Thank you  for allowing me to participate in the care of this patient.   Patient/Family are satisfied with care plan and all questions have been answered.    Provider disclosure: Patient with at least one acute or chronic illness or injury that poses a threat to life or bodily function  and is being managed actively during this encounter.  All of the below services have been performed independently by signing provider:  review of prior documentation from internal and or external health records.  Review of previous and current lab results.  Interview and comprehensive assessment during patient visit today. Review of current and previous chest radiographs/CT scans. Discussion of management and test interpretation with health care team and patient/family.   This document was prepared using Dragon voice recognition software and may include unintentional dictation errors.     Japleen Tornow, M.D.  Division of Pulmonary & Critical Care Medicine

## 2024-10-28 NOTE — Plan of Care (Signed)

## 2024-10-28 NOTE — Discharge Summary (Signed)
 Brandi [name] FMW:[id number] DOB: Nov 08, 1956 DOA: 10/26/2024  PCP: Edman [name], DO  Admit date: 10/26/2024 Discharge date: 10/28/2024  Time spent: 35 minutes  Recommendations for Outpatient Follow-up:  Close pulm f/u (scheduled next week)     Discharge Diagnoses:  Principal Problem:   COPD exacerbation (HCC) Active Problems:   Centrilobular emphysema (HCC)   Pre-diabetes   Generalized anxiety disorder with panic attacks   Insomnia   Asthma   Discharge Condition: stable  Diet recommendation: heart healthy  Filed Weights   10/26/24 1337 10/27/24 0435 10/28/24 0500  Weight: 55.3 kg 57.2 kg 57.2 kg    History of present illness:  From admission h and p Brandi [name] is a 68 y.o. year old female with medical history of COPD, asthma, hypertension, hyperlipidemia presenting to the ED with exertional dyspnea.     Pt states she uses oxygen  nightly and does not use it during the day.  But overall the last week she has been having to use it during the day and has been becoming concerned as she lives alone.  She has been having some coughing but no sputum production.  She denies any fevers or chills.  She denies any myalgias or any other signs of infection.  When asked if she has any sick contacts, she states she lives alone and does not go out much and even avoids going to church since her last hospitalization to prevent sick contacts.     On arrival to the ED patient was noted to be HDS stable.  Lab work and imaging obtained.  CBC without leukocytosis.  Normal hemoglobin.  Elevated absolute eosinophil count, BMP with mild hyperglycemia otherwise unremarkable, proBNP negative, troponin negative, VBG with 7.3 7/66/38 on arrival.  Respiratory panel negative for flu, COVID, RSV.  Chest x-ray without any acute findings.  Given her complaint of worsening dyspnea on exertion, and new oxygen  requirement, [facility] contacted for admission.    Hospital Course:   Patient presents with  dyspnea. Cxr clear, viral panels negative. This appears to be more progression of her advanced copd than a flare per se, though was treated for such with prednisone . She is stable on her home oxygen , evaluated by pt and deemed suitable for d/c (with home health PT which we have ordered), outpatient plan is to start dupixent soon, will d/c with pred taper per pulm to wean back down to baseline 5. Has close outpt f/u with pulm scheduled.   Procedures: none   Consultations: pulmonology  Discharge Exam: Vitals:   10/28/24 0359 10/28/24 0810  BP: 108/64 113/70  Pulse: 85 93  Resp: 16 16  Temp: (!) 97.5 F (36.4 C) 98.2 F (36.8 C)  SpO2: 100% 100%    General: NAD Cardiovascular: RRR Respiratory: few scattered rhonchi, no wheeze  Discharge Instructions   Discharge Instructions     Diet - low sodium heart healthy   Complete by: As directed    Increase activity slowly   Complete by: As directed       Allergies as of 10/28/2024   No Known Allergies      Medication List     TAKE these medications    albuterol  108 (90 Base) MCG/ACT inhaler Commonly known as: VENTOLIN  HFA INHALE 2 PUFFS INTO THE LUNGS EVERY 4 HOURS AS NEEDED FOR WHEEZING OR SHORTNESS OF BREATH   albuterol  (2.5 MG/3ML) 0.083% nebulizer solution Commonly known as: PROVENTIL  USE 1 VIAL VIA NEBULIZER EVERY 6 HOURS AS NEEDED FOR WHEEZING OR SHORTNESS OF BREATH  alendronate  70 MG tablet Commonly known as: FOSAMAX  Take 1 tablet (70 mg total) by mouth once a week.   aspirin  EC 81 MG tablet Take 1 tablet (81 mg total) by mouth daily.   benzonatate  100 MG capsule Commonly known as: TESSALON  Take 1 capsule (100 mg total) by mouth 3 (three) times daily as needed for cough.   Breztri  Aerosphere 160-9-4.8 MCG/ACT Aero inhaler Generic drug: budesonide -glycopyrrolate -formoterol  Inhale 2 puffs into the lungs in the morning and at bedtime.   busPIRone  7.5 MG tablet Commonly known as: BUSPAR  Take 1 tablet  (7.5 mg total) by mouth 2 (two) times daily as needed. for anxiety   cyanocobalamin  1000 MCG/ML injection Commonly known as: VITAMIN B12 Inject 1 mL (1,000 mcg total) into the muscle every 30 (thirty) days. For 4 months   docusate sodium  100 MG capsule Commonly known as: COLACE Take 1 capsule (100 mg total) by mouth daily as needed for mild constipation or moderate constipation.   Dupixent 300 MG/2ML Soaj Generic drug: Dupilumab   famotidine  20 MG tablet Commonly known as: PEPCID  TAKE 1 TABLET BY MOUTH TWICE DAILY AS NEEDED   feeding supplement Liqd Take 237 mLs by mouth 2 (two) times daily between meals.   furosemide  20 MG tablet Commonly known as: LASIX  Take 1 tablet (20 mg total) by mouth daily.   HAIR NOURISHING SUPPLEMENT PO Take 1 tablet by mouth daily.   ipratropium-albuterol  0.5-2.5 (3) MG/3ML Soln Commonly known as: DUONEB Take 3 mLs by nebulization 3 (three) times daily.   LORazepam  0.5 MG tablet Commonly known as: ATIVAN  Take 1 tablet (0.5 mg total) by mouth every 8 (eight) hours as needed for anxiety.   montelukast  10 MG tablet Commonly known as: SINGULAIR  Take 1 tablet (10 mg total) by mouth at bedtime.   multivitamin with minerals Tabs tablet Take 1 tablet by mouth daily.   omeprazole  20 MG capsule Commonly known as: PRILOSEC Take 1 capsule (20 mg total) by mouth 2 (two) times daily before a meal.   ondansetron  4 MG tablet Commonly known as: ZOFRAN  Take 1 tablet (4 mg total) by mouth every 6 (six) hours as needed for nausea or vomiting.   OXYGEN  Inhale 3 L into the lungs at bedtime as needed.   predniSONE  5 MG tablet Commonly known as: DELTASONE  Starting tomorrow: 35 mg day 1, 30 mg day 2, 25 mg day 3, 20 mg day 4, 15 mg day 5, 10 mg day 6, and then 5 mg every day What changed:  how much to take how to take this when to take this additional instructions   QUEtiapine  25 MG tablet Commonly known as: SEROQUEL  Take 1 tablet (25 mg total) by  mouth at bedtime.   rosuvastatin  10 MG tablet Commonly known as: CRESTOR  Take 1 tablet (10 mg total) by mouth daily.   sodium chloride  0.65 % Soln nasal spray Commonly known as: OCEAN Place 1 spray into both nostrils as needed for congestion.   sulfamethoxazole -trimethoprim  400-80 MG tablet Commonly known as: BACTRIM  Take 1 tablet by mouth 3 (three) times a week. M W F   Vitamin D3 125 MCG (5000 UT) Tabs Take by mouth. Vitamin D3 5,000 IU daily for 12 weeks then reduce to OTC Vitamin D3 2,000 IU daily for maintenance       No Known Allergies  Follow-up Information     Parris Manna, MD Follow up.   Specialty: Pulmonary Disease Contact information: [address] [place] [address] [phone]  The results of significant diagnostics from this hospitalization (including imaging, microbiology, ancillary and laboratory) are listed below for reference.    Significant Diagnostic Studies: DG Chest 2 View Result Date: 10/26/2024 CLINICAL DATA:  Shortness of breath.  Concern for pneumonia. EXAM: CHEST - 2 VIEW COMPARISON:  Chest radiograph dated 09/12/2024. FINDINGS: No focal consolidation, pleural effusion or pneumothorax. The cardiac silhouette is within normal limits. No acute osseous pathology. IMPRESSION: No active cardiopulmonary disease. Electronically Signed   By: Vanetta Chou M.D.   On: 10/26/2024 14:34    Microbiology: Recent Results (from the past 240 hours)  Resp panel by RT-PCR (RSV, Flu A&B, Covid) Anterior Nasal Swab     Status: None   Collection Time: 10/26/24  2:15 PM   Specimen: Anterior Nasal Swab  Result Value Ref Range Status   SARS Coronavirus 2 by RT PCR NEGATIVE NEGATIVE Final    Comment: (NOTE) SARS-CoV-2 target nucleic acids are NOT DETECTED.  The SARS-CoV-2 RNA is generally detectable in upper respiratory specimens during the acute phase of infection. The lowest concentration of SARS-CoV-2 viral copies this  assay can detect is 138 copies/mL. A negative result does not preclude SARS-Cov-2 infection and should not be used as the sole basis for treatment or other patient management decisions. A negative result may occur with  improper specimen collection/handling, submission of specimen other than nasopharyngeal swab, presence of viral mutation(s) within the areas targeted by this assay, and inadequate number of viral copies(<138 copies/mL). A negative result must be combined with clinical observations, patient history, and epidemiological information. The expected result is Negative.  Fact Sheet for Patients:  bloggercourse.com  Fact Sheet for Healthcare Providers:  seriousbroker.it  This test is no t yet approved or cleared by the [place]  FDA and  has been authorized for detection and/or diagnosis of SARS-CoV-2 by FDA under an Emergency Use Authorization (EUA). This EUA will remain  in effect (meaning this test can be used) for the duration of the COVID-19 declaration under Section 564(b)(1) of the Act, 21 U.S.C.section 360bbb-3(b)(1), unless the authorization is terminated  or revoked sooner.       Influenza A by PCR NEGATIVE NEGATIVE Final   Influenza B by PCR NEGATIVE NEGATIVE Final    Comment: (NOTE) The Xpert Xpress SARS-CoV-2/FLU/RSV plus assay is intended as an aid in the diagnosis of influenza from Nasopharyngeal swab specimens and should not be used as a sole basis for treatment. Nasal washings and aspirates are unacceptable for Xpert Xpress SARS-CoV-2/FLU/RSV testing.  Fact Sheet for Patients: bloggercourse.com  Fact Sheet for Healthcare Providers: seriousbroker.it  This test is not yet approved or cleared by the [place]  FDA and has been authorized for detection and/or diagnosis of SARS-CoV-2 by FDA under an Emergency Use Authorization (EUA). This EUA will  remain in effect (meaning this test can be used) for the duration of the COVID-19 declaration under Section 564(b)(1) of the Act, 21 U.S.C. section 360bbb-3(b)(1), unless the authorization is terminated or revoked.     Resp Syncytial Virus by PCR NEGATIVE NEGATIVE Final    Comment: (NOTE) Fact Sheet for Patients: bloggercourse.com  Fact Sheet for Healthcare Providers: seriousbroker.it  This test is not yet approved or cleared by the [place]  FDA and has been authorized for detection and/or diagnosis of SARS-CoV-2 by FDA under an Emergency Use Authorization (EUA). This EUA will remain in effect (meaning this test can be used) for the duration of the COVID-19 declaration under Section 564(b)(1) of the Act, 21 U.S.C. section  360bbb-3(b)(1), unless the authorization is terminated or revoked.  Performed at [facility], [address] [place], [place] [address]   Respiratory (~20 pathogens) panel by PCR     Status: None   Collection Time: 10/27/24  9:13 AM   Specimen: Nasopharyngeal Swab; Respiratory  Result Value Ref Range Status   Adenovirus NOT DETECTED NOT DETECTED Final   Coronavirus 229E NOT DETECTED NOT DETECTED Final    Comment: (NOTE) The Coronavirus on the Respiratory Panel, DOES NOT test for the novel  Coronavirus (2019 nCoV)    Coronavirus HKU1 NOT DETECTED NOT DETECTED Final   Coronavirus NL63 NOT DETECTED NOT DETECTED Final   Coronavirus OC43 NOT DETECTED NOT DETECTED Final   Metapneumovirus NOT DETECTED NOT DETECTED Final   Rhinovirus / Enterovirus NOT DETECTED NOT DETECTED Final   Influenza A NOT DETECTED NOT DETECTED Final   Influenza B NOT DETECTED NOT DETECTED Final   Parainfluenza Virus 1 NOT DETECTED NOT DETECTED Final   Parainfluenza Virus 2 NOT DETECTED NOT DETECTED Final   Parainfluenza Virus 3 NOT DETECTED NOT DETECTED Final   Parainfluenza Virus 4 NOT DETECTED NOT DETECTED Final    Respiratory Syncytial Virus NOT DETECTED NOT DETECTED Final   Bordetella pertussis NOT DETECTED NOT DETECTED Final   Bordetella Parapertussis NOT DETECTED NOT DETECTED Final   Chlamydophila pneumoniae NOT DETECTED NOT DETECTED Final   Mycoplasma pneumoniae NOT DETECTED NOT DETECTED Final    Comment: Performed at [facility], [address], [place], [place] [address]     Labs: Basic Metabolic Panel: Recent Labs  Lab 10/26/24 1336 10/26/24 1540 10/27/24 0420 10/28/24 0425  NA 141  --  142 141  K 3.6  --  4.2 4.1  CL 101  --  103 102  CO2 32  --  28 31  GLUCOSE 113*  --  128* 93  BUN 18  --  24* 23  CREATININE 0.71  --  0.61 0.57  CALCIUM  9.2  --  9.2 8.9  MG  --  2.2  --   --    Liver Function Tests: Recent Labs  Lab 10/26/24 1336  AST 21  ALT 12  ALKPHOS 48  BILITOT <0.2  PROT 6.7  ALBUMIN 4.3   No results for input(s): LIPASE, AMYLASE in the last 168 hours. No results for input(s): AMMONIA in the last 168 hours. CBC: Recent Labs  Lab 10/26/24 1336 10/27/24 0420  WBC 9.9 10.0  NEUTROABS 5.0  --   HGB 13.1 11.8*  HCT 40.1 36.0  MCV 97.6 97.0  PLT 347 329   Cardiac Enzymes: No results for input(s): CKTOTAL, CKMB, CKMBINDEX, TROPONINI in the last 168 hours. BNP: BNP (last 3 results) Recent Labs    09/09/24 0825  BNP 10.3    ProBNP (last 3 results) Recent Labs    10/26/24 1336  PROBNP <50.0    CBG: Recent Labs  Lab 10/27/24 0839 10/28/24 0812  GLUCAP 103* 91       Signed:  Devaughn KATHEE Ban MD.  [facility] 10/28/2024, 1:30 PM

## 2024-11-07 ENCOUNTER — Other Ambulatory Visit: Payer: Self-pay | Admitting: Family Medicine

## 2024-11-07 DIAGNOSIS — J432 Centrilobular emphysema: Secondary | ICD-10-CM

## 2024-11-09 ENCOUNTER — Telehealth: Payer: Self-pay

## 2024-11-09 NOTE — Telephone Encounter (Signed)
 Copied from CRM [id number]. Topic: Clinical - Home Health Verbal Orders >> Nov 09, 2024  1:11 PM Travis FALCON wrote: Caller/Agency: [facility]  Callback Number: [phone] Service Requested: Physical Therapy and Skilled Nursing Frequency: Skilled nursing 1 once a week for 4-6 weeks, is requesting a PT evaluation  Any new concerns about the patient? No

## 2024-11-09 NOTE — Telephone Encounter (Signed)
 Okay to proceed w/ verbal orders  [name], DO [facility] 11/09/2024, 4:08 PM

## 2024-11-10 NOTE — Telephone Encounter (Signed)
 Requested Prescriptions  Pending Prescriptions Disp Refills   albuterol  (VENTOLIN  HFA) 108 (90 Base) MCG/ACT inhaler [Pharmacy Med Name: ALBUTEROL  SULFATE HFA 108 (90 BASE)] 8.5 g 2    Sig: INHALE 2 PUFFS INTO THE LUNGS EVERY 4 HOURS AS NEEDED FOR WHEEZING OR SHORTNESS OF BREATH     Pulmonology:  Beta Agonists 2 Passed - 11/10/2024  1:15 PM      Passed - Last BP in normal range    BP Readings from Last 1 Encounters:  10/28/24 111/65         Passed - Last Heart Rate in normal range    Pulse Readings from Last 1 Encounters:  10/28/24 100         Passed - Valid encounter within last 12 months    Recent Outpatient Visits           1 month ago Centrilobular emphysema [facility])   [place] [facility] [place], [name], DO   2 months ago Acute bronchitis with COPD [facility])   [place] [facility] [place] [name], DO   3 months ago Centrilobular emphysema [facility])   [place] [name] [place], Angeline ORN, NP   5 months ago Stage 3 severe COPD by GOLD classification [facility])   [place] [facility] [place], [name], DO   6 months ago Gastroesophageal reflux disease without esophagitis   [place] [name] [place], [name], [place]

## 2024-11-10 NOTE — Telephone Encounter (Signed)
 Attempted to reach Cecelia at the provided number. No answer and unable to leave VM.

## 2024-11-22 ENCOUNTER — Ambulatory Visit

## 2024-11-22 DIAGNOSIS — E538 Deficiency of other specified B group vitamins: Secondary | ICD-10-CM

## 2024-11-22 MED ORDER — CYANOCOBALAMIN 1000 MCG/ML IJ SOLN
1000.0000 ug | Freq: Once | INTRAMUSCULAR | Status: AC
  Administered 2024-11-22: 14:00:00 1000 ug via INTRAMUSCULAR

## 2024-11-24 ENCOUNTER — Ambulatory Visit

## 2024-12-18 ENCOUNTER — Telehealth: Payer: Self-pay

## 2024-12-18 NOTE — Telephone Encounter (Signed)
 Copied from CRM #[id number]. Topic: Appointments - Scheduling Inquiry for Clinic >> Dec 18, 2024  4:14 PM Jasmin G wrote: Reason for CRM: Pt requested a call back at [phone] to discuss the possibility of scheduling a few shots needed on the same appt. Pt requested to be called today by the end of the day, if possible.

## 2024-12-18 NOTE — Telephone Encounter (Signed)
 Spoke with patient, she wanted our office to give her other injections that outside providers prescribe. Notified she would have to go to that office.

## 2024-12-26 ENCOUNTER — Ambulatory Visit

## 2024-12-27 ENCOUNTER — Other Ambulatory Visit: Payer: Self-pay | Admitting: Pulmonary Disease

## 2025-01-02 ENCOUNTER — Ambulatory Visit

## 2025-01-04 ENCOUNTER — Other Ambulatory Visit: Payer: Self-pay | Admitting: Family Medicine

## 2025-01-04 ENCOUNTER — Other Ambulatory Visit (HOSPITAL_COMMUNITY): Payer: Self-pay

## 2025-01-04 ENCOUNTER — Other Ambulatory Visit: Payer: Self-pay | Admitting: Pulmonary Disease

## 2025-01-04 DIAGNOSIS — K219 Gastro-esophageal reflux disease without esophagitis: Secondary | ICD-10-CM

## 2025-01-04 DIAGNOSIS — F410 Panic disorder [episodic paroxysmal anxiety] without agoraphobia: Secondary | ICD-10-CM

## 2025-01-04 DIAGNOSIS — F5104 Psychophysiologic insomnia: Secondary | ICD-10-CM

## 2025-01-04 DIAGNOSIS — J432 Centrilobular emphysema: Secondary | ICD-10-CM

## 2025-01-05 NOTE — Telephone Encounter (Signed)
 Requested Prescriptions  Pending Prescriptions Disp Refills   QUEtiapine  (SEROQUEL ) 25 MG tablet [Pharmacy Med Name: QUETIAPINE  FUMARATE 25 MG TAB] 90 tablet 1    Sig: TAKE 1 TABLET BY MOUTH AT BEDTIME     Not Delegated - Psychiatry:  Antipsychotics - Second Generation (Atypical) - quetiapine  Failed - 01/05/2025  9:27 AM      Failed - This refill cannot be delegated      Failed - Lipid Panel in normal range within the last 12 months    Cholesterol, Total  Date Value Ref Range Status  12/22/2021 165 100 - 199 mg/dL Final   LDL Cholesterol (Calc)  Date Value Ref Range Status  01/20/2021 111 (H) mg/dL (calc) Final    Comment:    Reference range: <100 . Desirable range <100 mg/dL for primary prevention;   <70 mg/dL for patients with CHD or diabetic patients  with > or = 2 CHD risk factors. SABRA LDL-C is now calculated using the Martin-Hopkins  calculation, which is a validated novel method providing  better accuracy than the Friedewald equation in the  estimation of LDL-C.  Gladis APPLETHWAITE et al. SANDREA. 7986;689(80): 2061-2068  (http://education.[employer].com/faq/FAQ164)    LDL Chol Calc (NIH)  Date Value Ref Range Status  12/22/2021 89 0 - 99 mg/dL Final   HDL  Date Value Ref Range Status  12/22/2021 63 >39 mg/dL Final   Triglycerides  Date Value Ref Range Status  12/22/2021 64 0 - 149 mg/dL Final         Passed - TSH in normal range and within 360 days    TSH  Date Value Ref Range Status  09/01/2024 0.85 0.40 - 4.50 mIU/L Final         Passed - Last BP in normal range    BP Readings from Last 1 Encounters:  10/28/24 111/65         Passed - Last Heart Rate in normal range    Pulse Readings from Last 1 Encounters:  10/28/24 100         Passed - Valid encounter within last 6 months    Recent Outpatient Visits           3 months ago Centrilobular emphysema [facility])   [place] [name] [place], [name], DO   4 months ago Acute  bronchitis with COPD [facility] [place])   [place] [facility] [place], [name], DO   5 months ago Centrilobular emphysema [facility])   [place] [facility] [place], Angeline ORN, NP   7 months ago Stage 3 severe COPD by GOLD classification [facility])   [place] [facility] [place], [name], DO   8 months ago Gastroesophageal reflux disease without esophagitis    [facility] [place], [name], DO              Passed - CBC within normal limits and completed in the last 12 months    WBC  Date Value Ref Range Status  10/27/2024 10.0 4.0 - 10.5 K/uL Final   RBC  Date Value Ref Range Status  10/27/2024 3.71 (L) 3.87 - 5.11 MIL/uL Final   Hemoglobin  Date Value Ref Range Status  10/27/2024 11.8 (L) 12.0 - 15.0 g/dL Final  87/94/7980 85.8 11.1 - 15.9 g/dL Final   HCT  Date Value Ref Range Status  10/27/2024 36.0 36.0 - 46.0 % Final   Hematocrit  Date Value Ref Range Status  11/10/2018 40.3 34.0 - 46.6 % Final   MCHC  Date Value Ref Range Status  10/27/2024 32.8 30.0 - 36.0 g/dL Final   [facility]  Date Value Ref Range Status  10/27/2024 31.8 26.0 - 34.0 pg Final   MCV  Date Value Ref Range Status  10/27/2024 97.0 80.0 - 100.0 fL Final  11/10/2018 92 79 - 97 fL Final   No results found for: PLTCOUNTKUC, LABPLAT, POCPLA RDW  Date Value Ref Range Status  10/27/2024 11.9 11.5 - 15.5 % Final  11/10/2018 11.8 (L) 12.3 - 15.4 % Final         Passed - CMP within normal limits and completed in the last 12 months    Albumin  Date Value Ref Range Status  10/26/2024 4.3 3.5 - 5.0 g/dL Final  95/73/7978 4.3 3.8 - 4.8 g/dL Final   Alkaline Phosphatase  Date Value Ref Range Status  10/26/2024 48 38 - 126 U/L Final   Alkaline phosphatase (APISO)  Date Value Ref Range Status  01/20/2021 53 37 - 153 U/L Final   ALT  Date Value Ref Range Status  10/26/2024 12 0 - 44 U/L Final   AST  Date  Value Ref Range Status  10/26/2024 21 15 - 41 U/L Final   BUN  Date Value Ref Range Status  10/28/2024 23 8 - 23 mg/dL Final  87/87/7977 12 8 - 27 mg/dL Final   Calcium   Date Value Ref Range Status  10/28/2024 8.9 8.9 - 10.3 mg/dL Final   CO2  Date Value Ref Range Status  10/28/2024 31 22 - 32 mmol/L Final   Bicarbonate  Date Value Ref Range Status  10/26/2024 38.2 (H) 20.0 - 28.0 mmol/L Final   Creat  Date Value Ref Range Status  09/01/2024 0.57 0.50 - 1.05 mg/dL Final   Creatinine, Ser  Date Value Ref Range Status  10/28/2024 0.57 0.44 - 1.00 mg/dL Final   Glucose, Bld  Date Value Ref Range Status  10/28/2024 93 70 - 99 mg/dL Final    Comment:    Glucose reference range applies only to samples taken after fasting for at least 8 hours.   Glucose-Capillary  Date Value Ref Range Status  10/28/2024 91 70 - 99 mg/dL Final    Comment:    Glucose reference range applies only to samples taken after fasting for at least 8 hours.   Potassium  Date Value Ref Range Status  10/28/2024 4.1 3.5 - 5.1 mmol/L Final   Sodium  Date Value Ref Range Status  10/28/2024 141 135 - 145 mmol/L Final  11/17/2021 140 134 - 144 mmol/L Final   Total Bilirubin  Date Value Ref Range Status  10/26/2024 <0.2 0.0 - 1.2 mg/dL Final   Bilirubin Total  Date Value Ref Range Status  04/01/2020 0.2 0.0 - 1.2 mg/dL Final   Bilirubin, Direct  Date Value Ref Range Status  04/01/2020 0.09 0.00 - 0.40 mg/dL Final   Protein, ur  Date Value Ref Range Status  10/30/2022 NEGATIVE NEGATIVE mg/dL Final   Total Protein  Date Value Ref Range Status  10/26/2024 6.7 6.5 - 8.1 g/dL Final  95/73/7978 6.3 6.0 - 8.5 g/dL Final   GFR, Est African American  Date Value Ref Range Status  01/20/2021 112 > OR = 60 mL/min/1.25m2 Final   eGFR  Date Value Ref Range Status  09/01/2024 99 > OR = 60 mL/min/1.51m2 Final  11/17/2021 73 >59 mL/min/1.73 Final   GFR, Est Non African American  Date Value Ref  Range Status  01/20/2021 97 > OR = 60 mL/min/1.42m2 Final   GFR, Estimated  Date Value Ref Range Status  10/28/2024 >60 >60 mL/min Final    Comment:    (NOTE) Calculated using the CKD-EPI Creatinine Equation (2021)           montelukast  (SINGULAIR ) 10 MG tablet [Pharmacy Med Name: MONTELUKAST  SODIUM 10 MG TAB] 90 tablet 0    Sig: TAKE 1 TABLET BY MOUTH AT BEDTIME     Pulmonology:  Leukotriene Inhibitors Passed - 01/05/2025  9:27 AM      Passed - Valid encounter within last 12 months    Recent Outpatient Visits           3 months ago Centrilobular emphysema [facility])   [place] [facility] [place], [name], DO   4 months ago Acute bronchitis with COPD [facility])   [place] [facility] [place], [name], DO   5 months ago Centrilobular emphysema [facility])   [place] [facility] [place], Angeline ORN, NP   7 months ago Stage 3 severe COPD by GOLD classification [facility])   [place] [facility] [place], [name], DO   8 months ago Gastroesophageal reflux disease without esophagitis   [place] [facility] [place], [name], DO               busPIRone  (BUSPAR ) 7.5 MG tablet [Pharmacy Med Name: BUSPIRONE  HCL 7.5 MG TAB] 180 tablet 0    Sig: TAKE 1 TABLET BY MOUTH TWICE DAILY AS NEEDED FOR ANXIETY *DOSE INCREASE*     Psychiatry: Anxiolytics/Hypnotics - Non-controlled Passed - 01/05/2025  9:27 AM      Passed - Valid encounter within last 12 months    Recent Outpatient Visits           3 months ago Centrilobular emphysema [facility])   [place] [facility] [place], [name], DO   4 months ago Acute bronchitis with COPD [facility])   [place] [facility] [place], [name], DO   5 months ago Centrilobular emphysema [facility])   [place] [facility] [place], Angeline ORN, NP   7 months ago Stage 3 severe COPD by GOLD  classification [facility])   [place] [facility] [place], [name], DO   8 months ago Gastroesophageal reflux disease without esophagitis   [place] [facility] [place], [name], DO               albuterol  (VENTOLIN  HFA) 108 (90 Base) MCG/ACT inhaler [Pharmacy Med Name: ALBUTEROL  SULFATE HFA 108 (90 BASE)] 8.5 g 0    Sig: INHALE 2 PUFFS INTO THE LUNGS EVERY 4 HOURS AS NEEDED FOR WHEEZING OR SHORTNESS OF BREATH     Pulmonology:  Beta Agonists 2 Passed - 01/05/2025  9:27 AM      Passed - Last BP in normal range    BP Readings from Last 1 Encounters:  10/28/24 111/65         Passed - Last Heart Rate in normal range    Pulse Readings from Last 1 Encounters:  10/28/24 100         Passed - Valid encounter within last 12 months    Recent Outpatient Visits           3 months ago Centrilobular emphysema (HCC)   [place] [facility] [place],  [name], DO   4 months ago Acute bronchitis with COPD [facility])   [place] [facility] [place], [name], DO   5 months ago Centrilobular emphysema [facility])   [place] [facility] [place], Angeline ORN, NP   7 months ago Stage 3 severe COPD by GOLD classification [facility])   [place] [facility] [place] [name], DO   8 months ago Gastroesophageal reflux disease without esophagitis   [place] [facility], [name], DO               famotidine  (PEPCID ) 20 MG tablet [Pharmacy Med Name: FAMOTIDINE  20 MG TAB] 180 tablet 0    Sig: TAKE 1 TABLET BY MOUTH TWICE DAILY AS NEEDED     Gastroenterology:  H2 Antagonists Passed - 01/05/2025  9:27 AM      Passed - Valid encounter within last 12 months    Recent Outpatient Visits           3 months ago Centrilobular emphysema [facility])   [place] [facility] [place], [name], DO   4 months ago Acute bronchitis with  COPD [facility])   [place] [facility] [place], [name], DO   5 months ago Centrilobular emphysema [facility])   [place] [facility] [place], Angeline ORN, NP   7 months ago Stage 3 severe COPD by GOLD classification [facility])   [place] [facility] [place] [name], DO   8 months ago Gastroesophageal reflux disease without esophagitis    [facility] [place], [name], [place]

## 2025-01-10 ENCOUNTER — Telehealth: Payer: Self-pay

## 2025-01-10 ENCOUNTER — Ambulatory Visit

## 2025-01-10 DIAGNOSIS — E538 Deficiency of other specified B group vitamins: Secondary | ICD-10-CM

## 2025-01-10 MED ORDER — CYANOCOBALAMIN 1000 MCG/ML IJ SOLN
1000.0000 ug | Freq: Once | INTRAMUSCULAR | Status: AC
  Administered 2025-01-10: 1000 ug via INTRAMUSCULAR

## 2025-01-10 NOTE — Addendum Note (Signed)
 Addended by: EDMAN [name] on: 01/10/2025 03:15 PM   Modules accepted: Orders

## 2025-01-10 NOTE — Telephone Encounter (Signed)
 Last b12 injection completed today. She is wanting to know if there is a tablet she can take or does she need to continue the injections.   If she is to continue injections she will need a new prescription.

## 2025-01-10 NOTE — Telephone Encounter (Signed)
 It looks like last lab for B12 was low in 08/2024. She will need to repeat blood test for B12 to determine her level. If it is still lower range, she should keep shots if they are helpful.  Can you check with her if she feels any improvement on B12 injections? And schedule the lab draw.  Future order is in.  If her B12 is improved, she could switch to oral B12 supplement tablet OTC 1000mcg daily.  We can let her know what to do after the blood draw  [name], DO [facility] 01/10/2025, 3:15 PM

## 2025-01-16 ENCOUNTER — Other Ambulatory Visit

## 2025-01-26 ENCOUNTER — Ambulatory Visit
